# Patient Record
Sex: Female | Born: 1945
Health system: Southern US, Community
[De-identification: ages and names within clinical notes are randomized; demographics above are authoritative.]

## PROBLEM LIST (undated history)

## (undated) DIAGNOSIS — G4730 Sleep apnea, unspecified: Secondary | ICD-10-CM

## (undated) DIAGNOSIS — E785 Hyperlipidemia, unspecified: Secondary | ICD-10-CM

## (undated) DIAGNOSIS — R062 Wheezing: Secondary | ICD-10-CM

## (undated) DIAGNOSIS — K469 Unspecified abdominal hernia without obstruction or gangrene: Secondary | ICD-10-CM

## (undated) DIAGNOSIS — I2721 Secondary pulmonary arterial hypertension: Secondary | ICD-10-CM

## (undated) DIAGNOSIS — Z9221 Personal history of antineoplastic chemotherapy: Secondary | ICD-10-CM

## (undated) DIAGNOSIS — D649 Anemia, unspecified: Secondary | ICD-10-CM

## (undated) DIAGNOSIS — Z923 Personal history of irradiation: Secondary | ICD-10-CM

## (undated) DIAGNOSIS — I10 Essential (primary) hypertension: Secondary | ICD-10-CM

## (undated) DIAGNOSIS — Z803 Family history of malignant neoplasm of breast: Secondary | ICD-10-CM

## (undated) DIAGNOSIS — K449 Diaphragmatic hernia without obstruction or gangrene: Secondary | ICD-10-CM

## (undated) DIAGNOSIS — D869 Sarcoidosis, unspecified: Secondary | ICD-10-CM

## (undated) DIAGNOSIS — Z5189 Encounter for other specified aftercare: Secondary | ICD-10-CM

## (undated) DIAGNOSIS — R0602 Shortness of breath: Secondary | ICD-10-CM

## (undated) DIAGNOSIS — Z8042 Family history of malignant neoplasm of prostate: Secondary | ICD-10-CM

## (undated) HISTORY — DX: Anemia, unspecified: D64.9

## (undated) HISTORY — DX: Family history of malignant neoplasm of breast: Z80.3

## (undated) HISTORY — DX: Family history of malignant neoplasm of prostate: Z80.42

## (undated) HISTORY — DX: Hyperlipidemia, unspecified: E78.5

## (undated) HISTORY — DX: Sarcoidosis, unspecified: D86.9

## (undated) HISTORY — DX: Wheezing: R06.2

## (undated) HISTORY — DX: Diaphragmatic hernia without obstruction or gangrene: K44.9

## (undated) HISTORY — PX: VESICOVAGINAL FISTULA CLOSURE W/ TAH: SUR271

## (undated) HISTORY — DX: Essential (primary) hypertension: I10

## (undated) HISTORY — DX: Secondary pulmonary arterial hypertension: I27.21

## (undated) HISTORY — DX: Encounter for other specified aftercare: Z51.89

## (undated) HISTORY — DX: Unspecified abdominal hernia without obstruction or gangrene: K46.9

---

## 1986-07-21 HISTORY — PX: ABDOMINAL HYSTERECTOMY: SHX81

## 1998-06-18 ENCOUNTER — Other Ambulatory Visit: Admission: RE | Admit: 1998-06-18 | Discharge: 1998-06-18 | Payer: Self-pay | Admitting: Gynecology

## 1999-07-11 ENCOUNTER — Encounter: Payer: Self-pay | Admitting: Orthopedic Surgery

## 1999-07-17 ENCOUNTER — Ambulatory Visit (HOSPITAL_COMMUNITY): Admission: RE | Admit: 1999-07-17 | Discharge: 1999-07-17 | Payer: Self-pay | Admitting: Orthopedic Surgery

## 1999-08-08 ENCOUNTER — Other Ambulatory Visit: Admission: RE | Admit: 1999-08-08 | Discharge: 1999-08-08 | Payer: Self-pay | Admitting: Gynecology

## 2000-03-30 ENCOUNTER — Encounter: Admission: RE | Admit: 2000-03-30 | Discharge: 2000-03-30 | Payer: Self-pay | Admitting: Gynecology

## 2000-03-30 ENCOUNTER — Encounter: Payer: Self-pay | Admitting: Gynecology

## 2000-04-08 ENCOUNTER — Encounter (INDEPENDENT_AMBULATORY_CARE_PROVIDER_SITE_OTHER): Payer: Self-pay | Admitting: Specialist

## 2000-04-08 ENCOUNTER — Ambulatory Visit (HOSPITAL_COMMUNITY): Admission: RE | Admit: 2000-04-08 | Discharge: 2000-04-08 | Payer: Self-pay | Admitting: Gastroenterology

## 2000-05-05 ENCOUNTER — Ambulatory Visit (HOSPITAL_COMMUNITY): Admission: RE | Admit: 2000-05-05 | Discharge: 2000-05-05 | Payer: Self-pay | Admitting: Gastroenterology

## 2000-05-21 ENCOUNTER — Encounter: Payer: Self-pay | Admitting: Gastroenterology

## 2000-05-21 ENCOUNTER — Encounter: Admission: RE | Admit: 2000-05-21 | Discharge: 2000-05-21 | Payer: Self-pay | Admitting: Gastroenterology

## 2000-08-12 ENCOUNTER — Other Ambulatory Visit: Admission: RE | Admit: 2000-08-12 | Discharge: 2000-08-12 | Payer: Self-pay | Admitting: Gynecology

## 2001-08-17 ENCOUNTER — Other Ambulatory Visit: Admission: RE | Admit: 2001-08-17 | Discharge: 2001-08-17 | Payer: Self-pay | Admitting: Gynecology

## 2001-08-31 ENCOUNTER — Encounter: Admission: RE | Admit: 2001-08-31 | Discharge: 2001-08-31 | Payer: Self-pay | Admitting: Internal Medicine

## 2001-08-31 ENCOUNTER — Encounter: Payer: Self-pay | Admitting: Internal Medicine

## 2001-12-23 ENCOUNTER — Ambulatory Visit (HOSPITAL_COMMUNITY): Admission: RE | Admit: 2001-12-23 | Discharge: 2001-12-23 | Payer: Self-pay | Admitting: Gastroenterology

## 2002-04-20 ENCOUNTER — Encounter: Payer: Self-pay | Admitting: Internal Medicine

## 2002-04-20 ENCOUNTER — Encounter: Admission: RE | Admit: 2002-04-20 | Discharge: 2002-04-20 | Payer: Self-pay | Admitting: Internal Medicine

## 2002-08-19 ENCOUNTER — Other Ambulatory Visit: Admission: RE | Admit: 2002-08-19 | Discharge: 2002-08-19 | Payer: Self-pay | Admitting: Gynecology

## 2002-08-29 ENCOUNTER — Encounter: Payer: Self-pay | Admitting: Gynecology

## 2002-08-29 ENCOUNTER — Encounter: Admission: RE | Admit: 2002-08-29 | Discharge: 2002-08-29 | Payer: Self-pay | Admitting: Gynecology

## 2002-09-05 ENCOUNTER — Encounter: Payer: Self-pay | Admitting: Internal Medicine

## 2002-09-05 ENCOUNTER — Encounter: Admission: RE | Admit: 2002-09-05 | Discharge: 2002-09-05 | Payer: Self-pay | Admitting: Internal Medicine

## 2002-09-20 ENCOUNTER — Encounter: Admission: RE | Admit: 2002-09-20 | Discharge: 2002-09-20 | Payer: Self-pay | Admitting: Internal Medicine

## 2002-09-20 ENCOUNTER — Encounter: Payer: Self-pay | Admitting: Internal Medicine

## 2002-12-20 ENCOUNTER — Encounter: Payer: Self-pay | Admitting: Internal Medicine

## 2002-12-20 ENCOUNTER — Encounter: Admission: RE | Admit: 2002-12-20 | Discharge: 2002-12-20 | Payer: Self-pay | Admitting: Internal Medicine

## 2003-07-22 DIAGNOSIS — Z5189 Encounter for other specified aftercare: Secondary | ICD-10-CM

## 2003-07-22 DIAGNOSIS — IMO0001 Reserved for inherently not codable concepts without codable children: Secondary | ICD-10-CM

## 2003-07-22 HISTORY — DX: Encounter for other specified aftercare: Z51.89

## 2003-07-22 HISTORY — DX: Reserved for inherently not codable concepts without codable children: IMO0001

## 2003-07-22 HISTORY — PX: BACK SURGERY: SHX140

## 2003-08-23 ENCOUNTER — Other Ambulatory Visit: Admission: RE | Admit: 2003-08-23 | Discharge: 2003-08-23 | Payer: Self-pay | Admitting: Gynecology

## 2003-08-31 ENCOUNTER — Encounter: Admission: RE | Admit: 2003-08-31 | Discharge: 2003-08-31 | Payer: Self-pay | Admitting: Gynecology

## 2004-06-06 ENCOUNTER — Inpatient Hospital Stay (HOSPITAL_COMMUNITY): Admission: RE | Admit: 2004-06-06 | Discharge: 2004-06-10 | Payer: Self-pay | Admitting: Orthopaedic Surgery

## 2004-09-05 ENCOUNTER — Encounter: Admission: RE | Admit: 2004-09-05 | Discharge: 2004-09-05 | Payer: Self-pay | Admitting: Gynecology

## 2004-09-27 ENCOUNTER — Other Ambulatory Visit: Admission: RE | Admit: 2004-09-27 | Discharge: 2004-09-27 | Payer: Self-pay | Admitting: Gynecology

## 2005-04-21 ENCOUNTER — Other Ambulatory Visit: Admission: RE | Admit: 2005-04-21 | Discharge: 2005-04-21 | Payer: Self-pay | Admitting: Gynecology

## 2005-10-22 ENCOUNTER — Encounter: Admission: RE | Admit: 2005-10-22 | Discharge: 2005-10-22 | Payer: Self-pay | Admitting: Gynecology

## 2005-10-27 ENCOUNTER — Other Ambulatory Visit: Admission: RE | Admit: 2005-10-27 | Discharge: 2005-10-27 | Payer: Self-pay | Admitting: Gynecology

## 2006-04-28 ENCOUNTER — Other Ambulatory Visit: Admission: RE | Admit: 2006-04-28 | Discharge: 2006-04-28 | Payer: Self-pay | Admitting: Gynecology

## 2006-10-26 ENCOUNTER — Encounter: Admission: RE | Admit: 2006-10-26 | Discharge: 2006-10-26 | Payer: Self-pay | Admitting: Gynecology

## 2006-10-28 ENCOUNTER — Ambulatory Visit (HOSPITAL_COMMUNITY): Admission: RE | Admit: 2006-10-28 | Discharge: 2006-10-28 | Payer: Self-pay | Admitting: Pulmonary Disease

## 2006-11-04 ENCOUNTER — Other Ambulatory Visit: Admission: RE | Admit: 2006-11-04 | Discharge: 2006-11-04 | Payer: Self-pay | Admitting: Gynecology

## 2007-04-05 ENCOUNTER — Encounter: Admission: RE | Admit: 2007-04-05 | Discharge: 2007-04-05 | Payer: Self-pay | Admitting: Internal Medicine

## 2007-10-27 ENCOUNTER — Encounter: Admission: RE | Admit: 2007-10-27 | Discharge: 2007-10-27 | Payer: Self-pay | Admitting: Internal Medicine

## 2007-11-26 ENCOUNTER — Ambulatory Visit: Payer: Self-pay | Admitting: Internal Medicine

## 2007-11-26 DIAGNOSIS — R0602 Shortness of breath: Secondary | ICD-10-CM | POA: Insufficient documentation

## 2007-11-26 DIAGNOSIS — D869 Sarcoidosis, unspecified: Secondary | ICD-10-CM

## 2007-11-26 LAB — CONVERTED CEMR LAB: Angiotensin 1 Converting Enzyme: 94 units/L — ABNORMAL HIGH (ref 9–67)

## 2007-12-09 DIAGNOSIS — I10 Essential (primary) hypertension: Secondary | ICD-10-CM

## 2007-12-09 DIAGNOSIS — E785 Hyperlipidemia, unspecified: Secondary | ICD-10-CM | POA: Insufficient documentation

## 2007-12-30 ENCOUNTER — Ambulatory Visit: Payer: Self-pay | Admitting: Internal Medicine

## 2008-06-30 ENCOUNTER — Ambulatory Visit: Payer: Self-pay | Admitting: Internal Medicine

## 2008-11-09 ENCOUNTER — Encounter: Admission: RE | Admit: 2008-11-09 | Discharge: 2008-11-09 | Payer: Self-pay | Admitting: Internal Medicine

## 2008-12-27 ENCOUNTER — Encounter: Admission: RE | Admit: 2008-12-27 | Discharge: 2008-12-27 | Payer: Self-pay | Admitting: Orthopedic Surgery

## 2009-06-29 ENCOUNTER — Ambulatory Visit: Payer: Self-pay | Admitting: Internal Medicine

## 2009-06-29 LAB — CONVERTED CEMR LAB: Angiotensin 1 Converting Enzyme: 70 units/L — ABNORMAL HIGH (ref 9–67)

## 2009-07-11 ENCOUNTER — Encounter: Admission: RE | Admit: 2009-07-11 | Discharge: 2009-07-11 | Payer: Self-pay | Admitting: Internal Medicine

## 2009-11-27 ENCOUNTER — Encounter: Admission: RE | Admit: 2009-11-27 | Discharge: 2009-11-27 | Payer: Self-pay | Admitting: Gynecology

## 2009-12-03 ENCOUNTER — Ambulatory Visit (HOSPITAL_COMMUNITY): Admission: RE | Admit: 2009-12-03 | Discharge: 2009-12-03 | Payer: Self-pay | Admitting: Internal Medicine

## 2010-06-11 ENCOUNTER — Encounter: Payer: Self-pay | Admitting: Internal Medicine

## 2010-06-28 ENCOUNTER — Encounter: Payer: Self-pay | Admitting: Internal Medicine

## 2010-06-28 ENCOUNTER — Ambulatory Visit: Payer: Self-pay | Admitting: Internal Medicine

## 2010-06-28 DIAGNOSIS — K219 Gastro-esophageal reflux disease without esophagitis: Secondary | ICD-10-CM

## 2010-07-02 ENCOUNTER — Ambulatory Visit: Payer: Self-pay | Admitting: Internal Medicine

## 2010-07-03 ENCOUNTER — Ambulatory Visit: Payer: Self-pay | Admitting: Internal Medicine

## 2010-07-08 ENCOUNTER — Ambulatory Visit: Payer: Self-pay | Admitting: Internal Medicine

## 2010-07-08 ENCOUNTER — Encounter: Payer: Self-pay | Admitting: Internal Medicine

## 2010-07-10 ENCOUNTER — Encounter: Payer: Self-pay | Admitting: Internal Medicine

## 2010-08-11 ENCOUNTER — Encounter: Payer: Self-pay | Admitting: Internal Medicine

## 2010-08-14 ENCOUNTER — Encounter: Payer: Self-pay | Admitting: Internal Medicine

## 2010-08-22 NOTE — Miscellaneous (Signed)
Summary: Orders Update pft charges  Clinical Lists Changes  Orders: Added new Service order of Carbon Monoxide diffusing w/capacity (94720) - Signed Added new Service order of Lung Volumes (94240) - Signed Added new Service order of Spirometry (Pre & Post) (94060) - Signed 

## 2010-08-22 NOTE — Assessment & Plan Note (Signed)
Summary: f/u 1 yr ///kp   Copy to:  [place] Primary Provider/Referring Provider:  Mirna Mires, MD  CC:  Yearly follow up visit-sarcoidosis; having increased SOB with activity, slight wheezing, and "popping" sounds when breathing out.Rachel Kelly  History of Present Illness:  06/30/08-sarcoid Declines flu vax.Feels fine. Walking less- cites back spasm. Denies fever, sweat, rash, nodes, cough.  June 29, 2009- Hx Sarcoid For most of past year has noted has noted more dyspnea with exertion and some wheeze at night. She is comfortable supine and sitting, and it doesn't wake her. No fever, sweats, glands, rash, palpitation or chest pain. Right lower leg swoll on long plane flight in late July with no hx varices or DVT. Due for physical in January. She declines flu vax.  June 28, 2010- Hx Sarcoid Nurse-CC:  Yearly follow up visit-sarcoidosis; having increased SOB with activity,slight wheezing, "popping" sounds when breathing out. CXR 06/29/09- Large hiatal hernia, stable sarcoid changes. ACE- 06/29/09 70 (9-67).  Started home oxygen at 2 L/M,  for sleep, for past 2 weeks  per Dr Loleta Chance after [facility]. She had negative w/u by cardiologist. Notes DOE walking, esp hills and stairs. Little wheeze, cough, chest pain or palpitation. No night sweats. Has gained weight. Aware that she snores.     Preventive Screening-Counseling & Management  Alcohol-Tobacco     Smoking Status: never  Current Medications (verified): 1)  Maxzide-25 37.5-25 Mg  Tabs (Triamterene-Hctz) .... Take 1/2 Tablet By Mouth Once A Day 2)  Zocor 20 Mg  Tabs (Simvastatin) .... Take 1 Tablet By Mouth Once A Day 3)  Vitamin D3 2000 Unit  Caps (Cholecalciferol) .... Take 1 Capsule By Mouth Once A Day 4)  Co Q-10 100 Mg  Caps (Coenzyme Q10) .... Take 1 Capsule By Mouth Once A Day 5)  Potassium Gluconate 595 Mg  Tabs (Potassium Gluconate) .... Take 1 Tablet By Mouth Once A Day 6)  Magnesium Gluconate 500 Mg Tabs (Magnesium Gluconate) ....  Take 1 By Mouth Once Daily 7)  Oxygen  At Bedtime 2l/m .... [facility]  Allergies (verified): 1)  ! Penicillin  Past History:  Past Medical History: Last updated: 11/26/2007 Hyperlipidemia Hypertension Sarcoidosis  Past Surgical History: Last updated: 11/26/2007 back surgery hysterectomy  Family History: Last updated: 11/26/2007 Family History Coronary Heart Disease Cancer Mother- pulmonary embolism  Social History: Last updated: 11/26/2007 Patient never smoked.  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  The patient denies shortness of breath at rest, productive cough, non-productive cough, coughing up blood, chest pain, irregular heartbeats, acid heartburn, indigestion, loss of appetite, abdominal pain, difficulty swallowing, sore throat, headaches, nasal congestion/difficulty breathing through nose, and sneezing.         Weight gain  Vital Signs:  Patient profile:   65 year old female Weight:      185.38 pounds O2 Sat:      99 % on Room air Pulse rate:   81 / minute BP sitting:   126 / 84  (left arm) Cuff size:   regular  Vitals Entered By: Reynaldo Minium CMA (June 28, 2010 10:37 AM)  O2 Flow:  Room air CC: Yearly follow up visit-sarcoidosis; having increased SOB with activity,slight wheezing, "popping" sounds when breathing out.   Physical Exam  Additional Exam:  General: A/Ox3; pleasant and cooperative, NAD, obese SKIN: no rash, lesions NODES: no lymphadenopathy HEENT: [place]/AT, EOM- WNL, Conjuctivae- clear, PERRLA, TM-WNL, Nose- clear, Throat- clear and wnl, Mallampati  II NECK: Supple w/ fair ROM, JVD- none, normal carotid impulses w/o bruits Thyroid- normal to palpation CHEST: Clear to P&A,  HEART: RRR, no m/g/r heard ABDOMEN: Soft and nl;  VWU:JWJX, nl  pulses, no edema  NEURO: Grossly intact to observation      CXR  Procedure date:  06/29/2009  Findings:      DG CHEST 2 VIEW - [id number]   Clinical Data: Dyspnea, sarcoidosis   CHEST - 2 VIEW   Comparison: 11/26/2007   Findings: Cardiomegaly again noted.  Large hiatal hernia is stable. Stable bilateral hilar prominence and right perihilar scarring. No acute infiltrate or edema.   IMPRESSION: No significant change.  Stable chronic findings of sarcoid.  Large hiatal hernia again noted.  No active disease.   Read By:  Kennieth Francois,  M.D.     Released By:  Kennieth Francois,  M.D.   Impression & Recommendations:  Problem # 1:  SARCOIDOSIS (ICD-135) Mild elevation of ACE level last year but not symtomatic of active inflammatory sarcoid. We will recheck ACE for trend.   Problem # 2:  DYSPNEA (ICD-786.05)  Now on O2 for sleep. Adnmits snoring., denies daytime sleepiness.  . She says O2 was low for 5 hours on ONOX. Her 6 MWT was good 2 years ago and resting room air sat now is 99%. I suspect sleep or postion related desaturation- perhaps obstructive sleep apnea or obesity hypoeventilation. We discussed weight loss. We will update CXR, PFT, ONOX on O2.   Problem # 3:  HIATAL HERNIA (ICD-553.3) Large hiatal hernia on chest xray. Reflux is denied, but reflux precautions are advised anyway.   Medications Added to Medication List This Visit: 1)  Magnesium Gluconate 500 Mg Tabs (Magnesium gluconate) .... Take 1 by mouth once daily 2)  Oxygen At Bedtime 2l/m  .... [facility]  Other Orders: Est. Patient Level IV (62130) T-Angiotensin i-Converting Enzyme ([id number]) Misc. Referral (Misc. Ref) T-2 View CXR ([id number])  Patient Instructions: 1)  Please schedule a follow-up appointment in 3 months. 2)  See PCC to schedule PFT, 6 MWT, overnigtht oximetry on oxygen 3)  Lab 4)  A chest x-ray has been recommended.  Your imaging study may require preauthorization.  5)  Try a  saline nose spray (AYR and oters, otc) for the popping sound, as needed.      CXR  Procedure date:  06/29/2009  Findings:      DG CHEST 2 VIEW - [id number]   Clinical Data: Dyspnea, sarcoidosis   CHEST - 2 VIEW   Comparison: 11/26/2007   Findings: Cardiomegaly again noted.  Large hiatal hernia is stable. Stable bilateral hilar prominence and right perihilar scarring. No acute infiltrate or edema.   IMPRESSION: No significant change.  Stable chronic findings of sarcoid.  Large hiatal hernia again noted.  No active disease.   Read By:  Kennieth Francois,  M.D.     Released By:  Kennieth Francois,  M.D.

## 2010-08-22 NOTE — Assessment & Plan Note (Signed)
Summary: SIX MIN WALK-PULM STRESS TEST  Nurse Visit   Vital Signs:  Patient profile:   65 year old female Pulse rate:   91 / minute BP sitting:   128 / 70   Medications Prior to Update: 1)  Maxzide-25 37.5-25 Mg  Tabs (Triamterene-Hctz) .... Take 1/2 Tablet By Mouth Once A Day 2)  Zocor 20 Mg  Tabs (Simvastatin) .... Take 1 Tablet By Mouth Once A Day 3)  Vitamin D3 2000 Unit  Caps (Cholecalciferol) .... Take 1 Capsule By Mouth Once A Day 4)  Co Q-10 100 Mg  Caps (Coenzyme Q10) .... Take 1 Capsule By Mouth Once A Day 5)  Potassium Gluconate 595 Mg  Tabs (Potassium Gluconate) .... Take 1 Tablet By Mouth Once A Day 6)  Magnesium Gluconate 500 Mg Tabs (Magnesium Gluconate) .... Take 1 By Mouth Once Daily 7)  Oxygen  At Bedtime 2l/m .... [facility]  Allergies: 1)  ! Penicillin  Orders Added: 1)  Pulmonary Stress (6 min walk) [94620]   Six Minute Walk Test Medications taken before test(dose and time): 1)  Maxzide-25 37.5-25 Mg  Tabs (Triamterene-Hctz) .... Take 1/2 Tablet By Mouth Once A Day 2)  Zocor 20 Mg  Tabs (Simvastatin) .... Take 1 Tablet By Mouth Once A Day 3)  Vitamin D3 2000 Unit  Caps (Cholecalciferol) .... Take 1 Capsule By Mouth Once A Day 4)  Co Q-10 100 Mg  Caps (Coenzyme Q10) .... Take 1 Capsule By Mouth Once A Day 5)  Potassium Gluconate 595 Mg  Tabs (Potassium Gluconate) .... Take 1 Tablet By Mouth Once A Day 6)  Magnesium Gluconate 500 Mg Tabs (Magnesium Gluconate) .... Take 1 By Mouth Once Daily Pt took all meds at 10 am today Supplemental oxygen during the test: No  Lap counter(place a tick mark inside a square for each lap completed) lap 1 complete  lap 2 complete   lap 3 complete   lap 4 complete  lap 5 complete  lap 6 complete  lap 7 complete   lap 8 complete    Baseline  BP sitting: 128/ 70 Heart rate: 91 Dyspnea ( Borg scale) 2 Fatigue (Borg scale) 0 SPO2 97  End Of Test  BP sitting: 134/ 76 Heart rate: 132 Dyspnea ( Borg scale)  4 Fatigue (Borg scale) 0 SPO2 96  2 Minutes post  BP sitting: 130/ 72 Heart rate: 101 SPO2 96  Stopped or paused before six minutes? No Other symptoms at end of exercise: Dizziness  Interpretation: Number of laps  8 X 48 meters =   384 meters+ final partial lap: 30 meters =    414 meters   Total distance walked in six minutes: 414 meters  Tech ID: Tivis Ringer, CNA (July 08, 2010 12:04 PM) Tech Comments Pt completed test w/ 0 rest breaks and 1 complaint:"slight" dizziness which resolved at 2 mins post.

## 2010-08-28 NOTE — Procedures (Signed)
Summary: Oximetry/[facility]  Oximetry/[facility]   Imported By: Lester [place] 08/22/2010 09:21:27  _____________________________________________________________________  External Attachment:    Type:   Image     Comment:   External Document

## 2010-08-28 NOTE — Procedures (Signed)
Summary: Oxmetry/[facility]  Oxmetry/[facility]   Imported By: Lester Ramos 08/22/2010 09:20:02  _____________________________________________________________________  External Attachment:    Type:   Image     Comment:   External Document

## 2010-09-26 ENCOUNTER — Ambulatory Visit (INDEPENDENT_AMBULATORY_CARE_PROVIDER_SITE_OTHER): Payer: Medicare Other | Admitting: Internal Medicine

## 2010-09-26 ENCOUNTER — Encounter: Payer: Self-pay | Admitting: Internal Medicine

## 2010-09-26 DIAGNOSIS — K449 Diaphragmatic hernia without obstruction or gangrene: Secondary | ICD-10-CM

## 2010-09-26 DIAGNOSIS — D869 Sarcoidosis, unspecified: Secondary | ICD-10-CM

## 2010-09-26 DIAGNOSIS — R0602 Shortness of breath: Secondary | ICD-10-CM

## 2010-10-08 NOTE — Procedures (Signed)
Summary: Pulse Oximetry/IDS  Pulse Oximetry/IDS   Imported By: Sherian Rein 10/02/2010 13:05:38  _____________________________________________________________________  External Attachment:    Type:   Image     Comment:   External Document

## 2010-10-08 NOTE — Assessment & Plan Note (Signed)
Summary: 3 month follow up   Copy to:  [place] Primary Provider/Referring Provider:  Mirna Mires, MD  CC:  3 month follow up visit-[facility] has helping and still using as needed.Marland Kitchen  History of Present Illness: June 29, 2009- Hx Sarcoid For most of past year has noted has noted more dyspnea with exertion and some wheeze at night. She is comfortable supine and sitting, and it doesn't wake her. No fever, sweats, glands, rash, palpitation or chest pain. Right lower leg swoll on long plane flight in late July with no hx varices or DVT. Due for physical in January. She declines flu vax.  June 28, 2010- Hx Sarcoid Nurse-CC:  Yearly follow up visit-sarcoidosis; having increased SOB with activity,slight wheezing, "popping" sounds when breathing out. CXR 06/29/09- Large hiatal hernia, stable sarcoid changes. ACE- 06/29/09 70 (9-67).  Started home oxygen at 2 L/M,  for sleep, for past 2 weeks  per Dr Loleta Chance after [facility]. She had negative w/u by cardiologist. Notes DOE walking, esp hills and stairs. Little wheeze, cough, chest pain or palpitation. No night sweats. Has gained weight. Aware that she snores.  September 26, 2010- Hx Sarcoid, Hiatal Hernia, PHTN,43 Nurse-CC: 3 month follow up visit-[facility] has helping and still using as needed. Lab/cxrReviewed.  Echocardiogram [facility] 08/14/10- Nl EF and LVF 55%, mild PHTN RVSP .  Still short of breath with no change and no problem with spring pollen.  Denies cough, sweats, fever, swollen glands. DOE with hills, stairs, housework and even walking 100 feet level in her home. Notices weight gain w/o fluid rettention.  ACE 06/28/10- 43 (8-52) ONOX 2L/min O2 07/10/10 good/ less than 89% less than 1 minute. 6MWT12/19/11- room air 975, 96%, 96% 414 meters. CXR 06/28/10- Mild stable chornic changes c/w sarcoid. Large Hiatal hernia w/ AFL. PFT- Moderate restriction TLC 65%, DLCO 53%.    Preventive Screening-Counseling & Management  Alcohol-Tobacco     Smoking Status:  never  Current Medications (verified): 1)  Maxzide-25 37.5-25 Mg  Tabs (Triamterene-Hctz) .... Take 1 Tablet By Mouth Once A Day 2)  Zocor 20 Mg  Tabs (Simvastatin) .... Take 1 Tablet By Mouth Once A Day 3)  Vitamin D3 2000 Unit  Caps (Cholecalciferol) .... Take 1 Capsule By Mouth Once A Day 4)  Co Q-10 100 Mg  Caps (Coenzyme Q10) .... Take 1 Capsule By Mouth Once A Day 5)  Potassium Gluconate 595 Mg  Tabs (Potassium Gluconate) .... Take 1 Tablet By Mouth Once A Day 6)  Magnesium Gluconate 500 Mg Tabs (Magnesium Gluconate) .... Take 1 By Mouth Once Daily 7)  Oxygen  At Bedtime 2l/m .... [facility]  Allergies (verified): 1)  ! Penicillin  Past History:  Past Medical History: Hyperlipidemia Hypertension Sarcoidosis  Mild Pulmonary Hypertension Echo 07/10/10 RVSP [facility] Hiatal Hernia  Review of Systems      See HPI       The patient complains of dyspnea on exertion.  The patient denies anorexia, fever, weight loss, weight gain, vision loss, decreased hearing, hoarseness, chest pain, syncope, peripheral edema, prolonged cough, headaches, hemoptysis, abdominal pain, severe indigestion/heartburn, unusual weight change, abnormal bleeding, enlarged lymph nodes, and angioedema.    Vital Signs:  Patient profile:   65 year old female Height:      60 inches Weight:      187.13 pounds BMI:     36.68 O2 Sat:      96 % on Room air Pulse rate:   72 / minute BP sitting:  120 / 72  (left arm) Cuff size:   large  Vitals Entered By: Reynaldo Minium CMA (September 26, 2010 10:44 AM)  O2 Flow:  Room air CC: 3 month follow up visit-[facility] has helping and still using as needed.   Physical Exam  Additional Exam:  General: A/Ox3; pleasant and cooperative, NAD, obese SKIN: no rash, lesions NODES: no lymphadenopathy HEENT: [place]/AT, EOM- WNL, Conjuctivae- clear, PERRLA, TM-WNL, Nose- clear, Throat- clear and wnl, Mallampati  II NECK: Supple w/ fair ROM, JVD- none, normal carotid impulses  w/o bruits Thyroid- normal to palpation CHEST: Clear to P&A,  HEART: RRR, no m/g/r heard ABDOMEN: Soft and nl;  ZOX:WRUE, nl pulses, no edema  NEURO: Grossly intact to observation      Impression & Recommendations:  Problem # 1:  SARCOIDOSIS (ICD-135) Clinically inactive, but likely contributes some to her restriction ond reduced DLCO even though CXR doesn't show bad interstitial disease.  Problem # 2:  DYSPNEA (ICD-786.05) Obesity and sarcoid are main issues. Pulmonary hypertension is mild and I doubt it would cause much problem for now, especially since she did well on 6 MWT. I recommended emphasis on weight loss and conditioning. She expresses intention to join a program.  Discussed sleep study- right now she isn't describing the clues ofr OSA  Medications Added to Medication List This Visit: 1)  Maxzide-25 37.5-25 Mg Tabs (Triamterene-hctz) .... Take 1 tablet by mouth once a day  Other Orders: Est. Patient Level IV (45409)  Patient Instructions: 1)  Please schedule a follow-up appointment in 3 months. 2)  Walk as much as you can. [facility] 3)  Anything you can do to lose some weight should help the shortness of breath.  4)  Please ask Dr Loleta Chance about help with the hiatal hernia 5)  Copies of lab results 6)  cc Dr Loleta Chance

## 2010-10-28 ENCOUNTER — Other Ambulatory Visit: Payer: Self-pay | Admitting: Family Medicine

## 2010-10-28 DIAGNOSIS — Z1231 Encounter for screening mammogram for malignant neoplasm of breast: Secondary | ICD-10-CM

## 2010-11-01 ENCOUNTER — Other Ambulatory Visit: Payer: Self-pay | Admitting: Gynecology

## 2010-11-29 ENCOUNTER — Ambulatory Visit: Payer: Medicare Other

## 2010-12-06 NOTE — Procedures (Signed)
[place]. [facility]  Patient:    Rachel Kelly, Rachel Kelly                       MRN: [id number] Proc. Date: 05/05/00 Adm. Date:  [id number] Attending:  Charna Elizabeth CC:         Dr. Dorothyann Peng   Procedure Report  DATE OF BIRTH:  [birth year]  REFERRING PHYSICIAN:  Dorothyann Peng, M.D.  PROCEDURE PERFORMED:  Colonoscopy.  ENDOSCOPIST:  Anselmo Rod, M.D.  INSTRUMENT USED:  Olympus video colonoscope.  INDICATIONS FOR PROCEDURE:  Iron deficiency anemia in a 65 year old black female rule out colonic polyps, masses, hemorrhoids, etc.  PREPROCEDURE PREPARATION:  Informed consent was procured from the patient. The patient was fasted for eight hours prior to the procedure and prepped with a bottle of magnesium citrate and a gallon of NuLytely the night prior to the procedure.  PREPROCEDURE PHYSICAL:  The patient had stable vital signs.  Neck supple. Chest clear to auscultation.  S1, S2 regular.  Abdomen soft with normal abdominal bowel sounds.  DESCRIPTION OF PROCEDURE:  The patient was placed in the left lateral decubitus position and sedated with 50 mg of Demerol and 4 mg of Versed intravenously.  Once the patient was adequately sedated and maintained on low-flow oxygen and continuous cardiac monitoring, the Olympus video colonoscope was advanced from the rectum to the cecum without difficulty.  No masses, polyps, erosions, ulcerations or diverticula were seen.  The patient tolerated the procedure well without complications.  No hemorrhoids were seen on retroflexion in the rectum.  IMPRESSION:  Normal colonoscopy up to the cecum.  RECOMMENDATIONS:  A small bowel follow-through will be done to complete her GI evaluation and further recommendations made as needed. DD:  05/05/00 TD:  05/05/00 Job: [id number]

## 2010-12-06 NOTE — Op Note (Signed)
[place]. [facility]  Patient:    Rachel Kelly                        MRN: [id number] Proc. Date: 07/17/99 Adm. Date:  [id number] Attending:  Wende Mott                           Operative Report  PREOPERATIVE DIAGNOSIS:  Dorsal callosity with spur formation left little toe.  POSTOPERATIVE DIAGNOSIS:  Dorsal callosity with spur formation left little toe.  ANESTHESIA:  General.  PROCEDURE:  Proximal phalangectomy left little toe.  The patient was taken to the operating room after given ______ and intubated.  The left foot was prepped with Duraprep and draped in a sterile manner.  Bovie was used for hemostasis.  A dorsal midline incision made over the left little toe on to the skin and subcutaneous tissue.  Hemostasis obtained with the Bovie.  The osteophyte was identified. The proximal phalanx was completely resected.  Copious irrigation was done with the use of antibiotic solution, followed by wound closure using 4 0 nylon.  A compressive dressing was applied.  Fracture shoe applied.  The patient tolerated the procedure quite well and went to the recovery room in stable and satisfactory condition. The patient is being discharged home on Percocet 1 q.4h. p.r.n. for pain. Elevation and ice packs and use of fracture shoe.  The patient is to return to the office in one week.  The patient is being discharged in stable and satisfactory condition. DD:  07/17/99 TD:  07/17/99 Job: [id number]

## 2010-12-06 NOTE — Op Note (Signed)
NAME:  Rachel Kelly, Rachel Kelly NO.:  [id number]   MEDICAL RECORD NO.:  [id number]          PATIENT TYPE:  INP   LOCATION:  5025                         FACILITY:  [facility]   PHYSICIAN:  Sharolyn Douglas, M.D.        DATE OF BIRTH:  1946/03/19   DATE OF PROCEDURE:  06/06/2004  DATE OF DISCHARGE:                                 OPERATIVE REPORT   PREOPERATIVE DIAGNOSIS:  Degenerative L4-5 spondylolisthesis with underlying  spinal stenosis.   POSTOPERATIVE DIAGNOSIS:  Degenerative L4-5 spondylolisthesis with  underlying spinal stenosis.   OPERATION PERFORMED:  1.  L4-5 decompressive laminectomy with wide decompression of the thecal sac      and nerve roots bilaterally.  2.  Transforaminal lumbar interbody fusion with placement of 12 mm PEAK cage      packed with local bone graft.  3.  Pedicle screw instrumentation, L4-5 using spinal concepts system.  4.  Posterior spinal arthrodesis, L4-5.  5.  Local autogenous bone graft supplemented with 5 mL [place]on.  6.  Neural monitoring testing of four pedicles screws, monitoring of EMGs      times two hours.   SURGEON:  Sharolyn Douglas, M.D.   ASSISTANT:  Verlin Fester, P.A.   ANESTHESIA:  General endotracheal.   COMPLICATIONS:  None.   INDICATIONS FOR PROCEDURE:  The patient is a pleasant 65 year old female  with symptoms consistent with neurogenic claudication and right lower  extremity radiculopathy thought to be secondary to degenerative  spondylolisthesis at L4-5 and spinal stenosis.  She has elected to undergo  L4-5 decompression and fusion in hopes of improving her symptoms.   DESCRIPTION OF PROCEDURE:  The patient was properly identified in the  holding area and taken to the operating room.  She underwent general  endotracheal anesthesia without difficulty.  She was given prophylactic IV  antibiotics.  She was very carefully position on the Wilson frame.  All bony  prominences were padded.  Face and eyes were protected at all  times.  Back  prepped and draped in the usual sterile fashion.  A midline incision was  made over the L4-5 interspaces.  Dissection was carried sharply through the  deep fascia.  Paraspinal muscles were elevated out over the L4 and L5  transverse processes.  Deep retractors were placed.  Wide laminectomy  completed removing the entire spinous process of L4 and lamina.  Lateral  recess was radically decompressed out to the pedicle.  Foraminotomies were  completed.  We felt we had good decompression of the thecal sac and nerve  roots bilaterally.  We then placed pedicle screws at L4 and L5 bilaterally.  Using anatomic probing technique, we were able to palpate the pedicles from  within the canal as well as within the pedicle holes.  We utilized 6.5 x 45  mm screws times 4.  We had good screw purchase.  We then turned our  attention to placing a TLIF on the right side at L4-5.  A facetectomy was  completed.  The L4 and L5 nerve roots identified and protected at all times.  Free  running EMGs monitors throughout the procedure.  There was no  deleterious changes.  Sharp annulotomy followed by various curettes to  completely remove the disk across the contralateral side.  The cartilaginous  end plates were scraped.  The disk space was dilated to 12 mm.  Disk space  packed with local bone graft obtained from laminectomy.  A 12 mm PEAK cage  was then inserted  which had been packed with local bone graft, inserted  obliquely into the interspace.  We had good distraction.  Then turned our  attention to completing the posterior spinal arthrodesis by decorticating L4  and L5 transverse processes bilaterally with high speed bur packing  remaining bone graft into the lateral gutters.  Each pedicle screw was  stimulated using triggered EMGs.  There were no deleterious changes.  Titanium rods were placed into the polyaxial screw heads.  Gentle  compression was applied before shearing off the locking caps.   The wound was  irrigated.  Intraoperative x-rays showed appropriate positioning of the  instrumentation at L4-5.  Deep Hemovac drain left in place.  Deep fascia  closed with a running #1 Vicryl suture, subcutaneous layer closed with 2-0  Vicryl suture followed by a running 3-0 subcuticular Vicryl suture on the  skin edges.  Benzoin and Steri-Strips placed.  Sterile dressing applied. The  patient was turned supine, extubated without difficulty and transported to  the recovery room in stable condition.       MC/MEDQ  D:  06/06/2004  T:  06/07/2004  Job:  [id number]

## 2010-12-06 NOTE — H&P (Signed)
[place]. [facility]  Patient:    Rachel Kelly                        MRN: [id number] Adm. Date:  [id number] Attending:  Wende Mott                         History and Physical  CHIEF COMPLAINT:  Painful left little toe.  HISTORY OF PRESENT ILLNESS:  This is a 65 year old female who had been followed in the office for spur formation and dorsal callosity of the left little toe.  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  PAST MEDICAL HISTORY:  Right fifth toe phalangectomy, history of hysterectomy/oophorectomy, heel spur.  CURRENT MEDICATIONS: 1. K-Dur. 2. Betamethasone cream 1% p.r.n. 3. Chlorthalidone 25 mg daily.  ALLERGIES:  PENICILLIN, which causes a rash.  HABITS:  None.  FAMILY HISTORY:  Noncontributory.  REVIEW OF SYSTEMS:  Basically, the patient has been in good health.  No cardiac or respiratory, and no urinary or bowel symptoms.  PHYSICAL EXAMINATION:  VITAL SIGNS:  Temperature 98.2, pulse 80, respirations 18, blood pressure 138/80, height 51 inches, weight 200 pounds.  HEENT:  Normocephalic.  Eyes:  Sclerae clear.  NECK:  Supple.  CHEST:  Clear.  CARDIAC:  S1, S2 regular.  EXTREMITIES:  Left little toe markedly tender, dorsal callosity of the fifth toe with osteophyte.  NEUROLOGIC:  Neurovascular status is intact.  IMPRESSION:  Osteophyte left little toe with dorsal callosity. DD:  07/17/99 TD:  07/17/99 Job: [id number]

## 2010-12-06 NOTE — Discharge Summary (Signed)
NAME:  Rachel Kelly, Rachel Kelly                ACCOUNT NO.:  [id number]   MEDICAL RECORD NO.:  [id number]          PATIENT TYPE:  INP   LOCATION:  5025                         FACILITY:  [facility]   PHYSICIAN:  Sharolyn Douglas, M.D.        DATE OF BIRTH:  19-Sep-1945   DATE OF ADMISSION:  06/06/2004  DATE OF DISCHARGE:  06/10/2004                                 DISCHARGE SUMMARY   ADMITTING DIAGNOSES:  1.  Spondylolisthesis L4-5.  2.  Hypertension.  3.  History of ulcer.  4.  Sarcoidosis.  5.  Mild hyponatremia postoperatively, resolved prior to discharge.   DISCHARGE DIAGNOSES:  1.  Status post posterior spinal fusion and laminectomy L4-5 doing well.  2.  Postoperative anemia requiring blood transfusion.  3.  Postoperative hyponatremia, resolved prior to discharge.  4.  Hypertension.  5.  History of ulcer.  6.  Sarcoidosis.   PROCEDURE:  June 06, 2004 patient was taken to the operating room for L4-  5 posterior spinal fusion and T lift as well as laminectomy.  This was done  by Sharolyn Douglas, M.D.  Assistant [employer], P.A.-C.  Anesthesia used was  general.   LABORATORIES:  Preoperatively:  CBC with differential was within normal  limits with the exception of red cells of 3.75, hemoglobin 11.7, hematocrit  33.9.  Complete metabolic panel within normal limits with the exception of  creatinine slightly elevated at 1.5.  UA from November 14 showed few  epithelials, 3-6 wbc's, and many bacteria.  Blood typing from November 14 is  type A+, antibody screen negative.  H&H three days postoperatively reached a  low of 8.6 and 24.6 on November 19.  She was transfused the following day  and hemoglobin and hematocrit increased up well to 11.4 and 32.3,  respectively.  PT/INR and PTT preoperatively within normal limits.  Basic  metabolic panel from postoperative day one on June 07, 2004 showed  sodium of 128, glucose 121, calcium of 8.1, otherwise normal.  Following day  this was rechecked.  Sodium  had returned to normal limits, glucose normal,  and calcium remained slightly decreased at 8.3.  EKG from November 14 showed  normal sinus rhythm, normal tracing read by Dr. Peter [place]az[place].  X-rays from  November 17 portable spine was used intraoperatively for localization.  November 21 showed good position/alignment following L4-5 diskectomy/fusion.   BRIEF HISTORY:  Patient is a 65 year old female _________ continued pain in  her back and into her right lower extremity.  Developed neurogenic  claudication.  Her pain is moderate intensity in leg and 6/10.  Anti-  inflammatory, physical therapy, chiropractic care, injections epidurally in  the facet have not given her any relief of her symptoms.  Secondary to the  severe findings on MRI as well as her continued pain and failure of  conservative management it is thought the best course of management would be  a decompression and fusion at L4-5.  Risks and benefits of this were  discussed with the patient by Dr. Noel Gerold as well as myself.  She indicated  understanding and opted  to proceed.   HOSPITAL COURSE:  On June 06, 2004 patient was taken to the operating  room for the above listed procedures.  She tolerated procedure well without  any intraoperative complications.  Postoperatively routine orthopedic spine  protocol was followed.  She progressed along relatively well with that  protocol.  On postoperative day two patient's hemoglobin had reached a low  of 8.6.  She was symptomatic.  Therefore, she was transfused 2 units of  packed red blood cells.  She tolerated transfusion well then her hemoglobin  improved significantly by the following day as previously dictated.  By  postoperative day one she did develop hyponatremia at 128.  Her IV fluids  were changed to normal saline and by the following day her sodium had  improved to 135.  On postoperative day two daily dressing changes were  started.  Her Hemovac drain was pulled without any  difficulty.  Her diet was  slowly advanced after flatus and she progressed along well.  By June 10, 2004 patient had met all orthopedic goals.  She was medically stable and  ready for discharge.  Her hemoglobin was stable.  She was status of  independent with physical therapy and ambulation.   DISCHARGE PLAN:  Patient is a 65 year old female status post posterior  spinal fusion doing well.   ACTIVITY:  Daily ambulation program.  Brace on when she is up.  Back  precautions at all times.  No lifting heavier than 5 pounds.  Daily dressing  changes.  Keep incision dry until drainage completely stopped.  Follow up  two weeks postoperatively with Dr. Noel Gerold.   MEDICATIONS:  She will resume her home medications as well as Vicodin as  needed for pain, Robaxin as needed for muscle spasm, multivitamin daily,  calcium daily, laxative as needed.  Avoid anti-inflammatories and resume all  home medications.   FOLLOW-UP:  Follow up two weeks postoperatively with Dr. Noel Gerold.   DIET:  Regular home diet.   CONDITION ON DISCHARGE:  Stable, improved.   DISPOSITION:  Patient will be discharged to her home with the family  assistance as well as home health physical therapy and occupational therapy  per [place].      CM/MEDQ  D:  08/21/2004  T:  08/21/2004  Job:  [id number]

## 2010-12-06 NOTE — Procedures (Signed)
[place]. [facility]  Patient:    Rachel Kelly, Rachel Kelly                       MRN: [id number] Proc. Date: 04/08/00 Adm. Date:  [id number] Attending:  Charna Elizabeth CC:         Dr. Velna Hatchet   Procedure Report  DATE OF BIRTH:  1945/10/22.  REFERRING PHYSICIAN:  Dr. Velna Hatchet.  PROCEDURE PERFORMED:  Esophagogastroduodenoscopy with biopsies.  ENDOSCOPIST:  Anselmo Rod, M.D.  INSTRUMENT USED:  Olympus video panendoscope.  INDICATIONS FOR PROCEDURE:  Melenic stool in a 66 year old black female for the last four days.  Rule out peptic ulcer disease, esophagitis, gastritis, etc.  PREPROCEDURE PREPARATION:  Informed consent was procured from the patient. The patient was fasted for eight hours prior to the procedure.  PREPROCEDURE PHYSICAL:  The patient had stable vital signs.  Neck supple. Chest clear to auscultation.  S1, S2 regular.  Abdomen soft with epigastric tenderness on palpation with guarding, no rebound, no rigidity, no hepatosplenomegaly.  DESCRIPTION OF PROCEDURE:  The patient was placed in left lateral decubitus position and sedated with 30 mg of Demerol and 3 mg of Versed intravenously. Once the patient was adequately sedated and maintained on low-flow oxygen and continuous cardiac monitoring, the Olympus video panendoscope was advanced through the mouthpiece, over the tongue, into the esophagus under direct vision.  The entire esophagus appeared normal without evidence of ring, string, stricture or esophagitis.  The scope was then advanced into the stomach.  Multiple gastric ulcers were seen in the proximal half of the stomach with one superficial ulcer with an adherent clot and another ulcer with old heme overlying it and another deeper ulcer measuring about 0.5 to 1.0 cm in size with an adherent clot.  This ulcer was injected with 2 cc of epinephrine at the ulcer base.  There was evidence of gastritis around the ulcers which  seemed to span both the lesser and greater curvature in the proximal half of the stomach.  The prepyloric area of the small bowel up to 70 cm appeared normal.  There was no outlet obstruction.  The patient tolerated the procedure well without complications.  A small hiatal hernia was seen on high retroflexion.  IMPRESSION: 1. Multiple gastric ulcers. 2. One ulcer with adherent clot injected with epinephrine (2 cc). 3. Small hiatal hernia. 4. Normal prepyloric area, pylorus, proximal small bowel and esophagus. 5. Biopsies done around the ulcers to rule out Helicobacter pylori by    pathology.  RECOMMENDATION: 1. Patient will be started on Prevacid 30 mg 1 p.o. q.d. 2. If there is evidence of Helicobacter pylori infection on pathology or    serology, she will be treated with antibiotics. 3. She has strongly been advised to refrain from the use of all nonsteroidals. 4. Carafate slurry 1 gm q.i.d. prescribed for her for the next 15 days. 5. A repeat EGD is recommended in the next eight weeks considering her    history of multiple gastric ulcers. DD:  04/08/00 TD:  04/09/00 Job: [id number]

## 2010-12-06 NOTE — Op Note (Signed)
[place]. [facility]  Patient:    Rachel Kelly, Rachel Kelly Visit Number: [id number] MRN: [id number]          Service Type: END Location: ENDO Attending Physician:  Charna Elizabeth Dictated by:   Anselmo Rod, M.D. Proc. Date: 12/23/01 Admit Date:  12/23/2001 Discharge Date: 12/23/2001   CC:         Velna Hatchet, M.D.   Operative Report  DATE OF BIRTH: September 06, 1944.  PROCEDURES PERFORMED: Esophagogastroduodenoscopy.  ENDOSCOPIST: Anselmo Rod, M.D.  INSTRUMENTS USED: Olympus video panendoscope.  INDICATIONS FOR PROCEDURE: The patient is a 65 year old African-American female with a history of ulcers. A repeat EGD is being done to document healing. Preprocedure preparation and informed consent was procured from the patient. The patient fasted for eight hours prior to the procedure.  PREPROCEDURE PHYSICAL: The patient had stable vital signs. Neck was supple. Chest clear to auscultation. S1, S2 regular. Abdomen soft with normoactive bowel sounds.  DESCRIPTION OF THE PROCEDURE: The patient was placed in the left lateral decubitus position and sedated with 30 mg of Demerol and 3 mg of Versed intravenously. Once the patient was adequately sedated and maintained on low flow oxygen and continuous cardiac monitoring, the Olympus video panendoscope was advanced through the mouth piece over the tongue into the esophagus and under direct vision the entire esophagus appeared normal without evidence of rings, strictures, masses, esophagitis or varus mucosa.  The scope was then advanced into the stomach. A large hiatal hernia was seen. Retroflexion was not possible. The rest of the gastric mucosa in the proximal small bowel appeared normal.  IMPRESSION: Normal EGD except for a large hiatal hernia.  RECOMMENDATIONS  1. The patient will been seen in the office on an outpatient basis and an    upper GI series done to further evaluate the size of her hiatal  hernia and    recommendations made thereafter. 2. Antireflux measures have been advised. 3. Avoidance of all nonsteroidals including aspirin has been discussed with    the patient.   Dictated by:   Anselmo Rod, M.D. Attending Physician:  Charna Elizabeth DD:  12/24/01 TD:  12/27/01 Job: [id number]

## 2010-12-06 NOTE — H&P (Signed)
NAME:  Rachel Kelly, CHOI NO.:  [id number]   MEDICAL RECORD NO.:  [id number]          PATIENT TYPE:  INP   LOCATION:                               FACILITY:  [facility]   PHYSICIAN:  Sharolyn Douglas, M.D.        DATE OF BIRTH:  11-16-1945   DATE OF ADMISSION:  06/06/2004  DATE OF DISCHARGE:                                HISTORY & PHYSICAL   CHIEF COMPLAINT:  Pain in my right leg.   HISTORY OF PRESENT ILLNESS:  This 65 year old female with persistent and  continuing pain into her buttock and right lower extremity.  She has  developed a neurogenic claudication.  Her pain is moderate intensity into  the leg with a 6/10.  She has had anti-inflammatories, physical therapy,  chiropractic care, epidural steroid injections as well as facet injections  which unfortunately has not given her any lasting relief.  MRI has shown  multifactorial and lateral recess stenosis, specifically at L3-4, and L4-5.  The L4-5 spondylolisthesis with spinal stenosis was most significant and it  was decided to go ahead with surgical intervention concerning that level.  The risks and benefits of surgery have been discussed with the patient by  Dr. Noel Gerold and she has elected to go ahead.   PAST MEDICAL HISTORY:  This patient's physician is Merlene Laughter. Renae Gloss, M.D.  of [facility] here in [place] and she has cleared  the patient for surgery. Current medication for hypertension is Triam/HCTZ  37.5/25 mg one daily and Extra Strength Tylenol for discomfort.   ALLERGIES:  PENICILLIN causes rash.   She also has ulcer disease in the distant past and has done well with her  treatment with p.o. medications.  She had renal calculi some years ago.   PAST SURGICAL HISTORY:  Ovarian cysts with fibroid tumor excision in 1986  and removal of a heel spur some time in the early 90's.   FAMILY HISTORY:  Positive for heart disease in her father, hypertension in  the mother, and grandmother with  diabetes.   SOCIAL HISTORY:  The patient is single, retired Engineer, site.  No intake  of alcohol or tobacco products.  Has one child.  The caregiver after surgery  will be her mother and daughter.   REVIEW OF SYSTEMS:  CNS:  No seizure disorder, paralysis, numbness, or  double vision.  RESPIRATORY:  No productive cough, no hemoptysis, no  shortness of breath.  CARDIOVASCULAR:  No chest pain, no angina, and no  orthopnea.  GASTROINTESTINAL:  No nausea, vomiting, melena, or bloody  stools.  GENITOURINARY:  No discharge, dysuria, or hematuria.  MUSCULOSKELETAL:  Primarily in present illness.   PHYSICAL EXAMINATION:  GENERAL:  Alert and cooperative, friendly, well-  groomed 65 year old female.  VITAL SIGNS:  Blood pressure 102/72, pulse 64, respirations 17,  HEENT:  Normocephalic.  PERRLA.  EOM intact.  Oropharynx is clear.  CHEST:  Clear to auscultation, no rhonchi and no rales.  HEART:  Regular rate and rhythm with no murmurs heard.  ABDOMEN:  Soft and nontender.  The liver and  spleen are not felt.  GENITOURINARY:  RECTAL:  BREASTS:  Not done, no pertinent to present  illness.  EXTREMITIES:  The patient has symmetric DTR's in the lower extremity,  negative clonus, and sensory is intact.  Straight leg raise is negative.   ADMISSION DIAGNOSES:  1.  L4-5 spondylolisthesis with spinal stenosis.  2.  Hypertension.  3.  History of ulcer disease.  4.  Sarcoidosis.   PLAN:  The patient will undergo L4-5 laminectomy with posterior spinal  fusion and transthoracic lumbar interbody fusion.       ________________________________________  Druscilla Brownie Cherlynn June.  ___________________________________________  Sharolyn Douglas, M.D.    DLU/MEDQ  D:  05/10/2004  T:  05/10/2004  Job:  [id number]   cc:   Merlene Laughter. Renae Gloss, M.D.  [address]  [address]  [place]  [place] [address]  Fax: [phone]

## 2010-12-24 ENCOUNTER — Ambulatory Visit
Admission: RE | Admit: 2010-12-24 | Discharge: 2010-12-24 | Disposition: A | Discharge status: Home / Self Care | Payer: Medicare Other | Source: Ambulatory Visit | Attending: Family Medicine | Admitting: Family Medicine

## 2010-12-24 DIAGNOSIS — Z1231 Encounter for screening mammogram for malignant neoplasm of breast: Secondary | ICD-10-CM

## 2010-12-26 ENCOUNTER — Encounter: Payer: Self-pay | Admitting: Internal Medicine

## 2010-12-27 ENCOUNTER — Ambulatory Visit (INDEPENDENT_AMBULATORY_CARE_PROVIDER_SITE_OTHER): Payer: Medicare Other | Admitting: Internal Medicine

## 2010-12-27 ENCOUNTER — Encounter: Payer: Self-pay | Admitting: Internal Medicine

## 2010-12-27 VITALS — BP 110/80 | HR 69 | Ht 60.0 in | Wt 174.0 lb

## 2010-12-27 DIAGNOSIS — D869 Sarcoidosis, unspecified: Secondary | ICD-10-CM

## 2010-12-27 DIAGNOSIS — R0602 Shortness of breath: Secondary | ICD-10-CM

## 2010-12-27 NOTE — Assessment & Plan Note (Signed)
She is not dyspneic with ADLS and didn't desaturate at last . Weight loss may make the real difference here. We will update her ONOX on room air.

## 2010-12-27 NOTE — Patient Instructions (Signed)
Order- [facility] schedule ONOX on room air  Dx sarcoid                              Cc Dr Alyse Low

## 2010-12-27 NOTE — Assessment & Plan Note (Signed)
Clinically stable. Last ACE was not elevated.

## 2010-12-27 NOTE — Progress Notes (Signed)
  Subjective:    Patient ID: Rachel Kelly, female    DOB: Jul 13, 1946, 65 y.o.   MRN: [id number]  HPI 12/27/10- 49 yoF never smoker followed for sarcoid, complicated by dyspnea, hiatal hernia/ GERD risk Last here September 26, 2010- note reviewed. Labs were updated from 12/11 visit. Since March says she has done very well, denying dyspnea, wheeze or night sweats Admits just a little dry cough occasionally- not concerned. No rash or nodes. She has lost some weight and would like to retest oxygen to see if she can stop O2 used now just at night. She did not desat on 6 MWT in December. DLCO was 53% then.   Review of Systems Constitutional:   No weight loss, night sweats,  Fevers, chills, fatigue, lassitude. HEENT:   No headaches,  Difficulty swallowing,  Tooth/dental problems,  Sore throat,                No sneezing, itching, ear ache, nasal congestion, post nasal drip,   CV:  No chest pain,  Orthopnea, PND, swelling in lower extremities, anasarca, dizziness, palpitations  GI  No heartburn, indigestion, abdominal pain, nausea, vomiting, diarrhea, change in bowel habits, loss of appetite  Resp: No shortness of breath with exertion or at rest that she notices for her level of fitness. .  No excess mucus, no productive cough,  Mild non-productive cough,  No coughing up of blood.  No change in color of mucus.  No wheezing.   Skin: no rash or lesions.  GU: no dysuria, change in color of urine, no urgency or frequency.  No flank pain.  MS:  No joint pain or swelling.  No decreased range of motion.  No back pain.  Psych:  No change in mood or affect. No depression or anxiety.  No memory loss.      Objective:   Physical Exam General- Alert, Oriented, Affect-appropriate, Distress- none acute  Still overweight  Skin- rash-none, lesions- none, excoriation- none  Lymphadenopathy- none  Head- atraumatic  Eyes- Gross vision intact, PERRLA, conjunctivae clear secretions  Ears- Hearing, canals, Tm -  normal  Nose- Clear, No- Septal dev, mucus, polyps, erosion, perforation   Throat- Mallampati II , mucosa clear , drainage- none, tonsils- atrophic  Neck- flexible , trachea midline, no stridor , thyroid nl, carotid no bruit  Chest - symmetrical excursion , unlabored     Heart/CV- RRR , no murmur , no gallop  , no rub, nl s1 s2                     - JVD- none , edema- none, stasis changes- none, varices- none     Lung- clear to P&A, wheeze- none, cough- none , dullness-none, rub- none     Chest wall-   Abd- tender-no, distended-no, bowel sounds-present, HSM- no  Br/ Gen/ Rectal- Not done, not indicated  Extrem- cyanosis- none, clubbing, none, atrophy- none, strength- nl  Neuro- grossly intact to observation         Assessment & Plan:

## 2011-03-03 ENCOUNTER — Telehealth: Payer: Self-pay | Admitting: Internal Medicine

## 2011-03-03 NOTE — Telephone Encounter (Signed)
Pt was last seen by CY on 6/8 and order was sent to [facility] for ONO.  Pt states she hasn't been contacted by a DME company  to have this done.  PCCs, please advise.  Thanks.

## 2011-03-04 NOTE — Telephone Encounter (Signed)
This is one of those order that pcc can not see it was not done will sent it as apirioty to [facility] to do ono on ra

## 2011-04-09 ENCOUNTER — Other Ambulatory Visit: Payer: Self-pay | Admitting: Family Medicine

## 2011-04-11 ENCOUNTER — Ambulatory Visit
Admission: RE | Admit: 2011-04-11 | Discharge: 2011-04-11 | Disposition: A | Discharge status: Home / Self Care | Payer: Medicare Other | Source: Ambulatory Visit | Attending: Family Medicine | Admitting: Family Medicine

## 2011-04-28 ENCOUNTER — Ambulatory Visit (INDEPENDENT_AMBULATORY_CARE_PROVIDER_SITE_OTHER): Payer: Medicare Other | Admitting: Internal Medicine

## 2011-04-28 ENCOUNTER — Encounter: Payer: Self-pay | Admitting: Internal Medicine

## 2011-04-28 VITALS — BP 128/76 | HR 94 | Ht 60.0 in | Wt 179.4 lb

## 2011-04-28 DIAGNOSIS — D869 Sarcoidosis, unspecified: Secondary | ICD-10-CM

## 2011-04-28 DIAGNOSIS — K449 Diaphragmatic hernia without obstruction or gangrene: Secondary | ICD-10-CM

## 2011-04-28 NOTE — Patient Instructions (Signed)
On your trip away from home see what your room mate notices about snoring or stopping breathing in your sleep. If you will call us back after that, we can schedule a sleep study to look for sleep apnea.  For now, continue oxygen for sleep at 2 l/m

## 2011-04-28 NOTE — Progress Notes (Signed)
Patient ID: Rachel Kelly, female    DOB: 05-Jun-1946, 65 y.o.   MRN: [id number] PCP Dr Mirna Mires  HPI 12/27/10- 22 yoF never smoker followed for sarcoid, complicated by dyspnea, hiatal hernia/ GERD risk Last here September 26, 2010- note reviewed. Labs were updated from 12/11 visit. Since March says she has done very well, denying dyspnea, wheeze or night sweats Admits just a little dry cough occasionally- not concerned. No rash or nodes. She has lost some weight and would like to retest oxygen to see if she can stop O2 used now just at night. She did not desat on 6 MWT in December. DLCO was 53% then.   04/28/11-  65 yoF never smoker followed for sarcoid, complicated by dyspnea, hiatal hernia/ GERD risk She considers herself "low risk" and therefore declines flu vaccine.Overnight oximetry on room air 03/05/2011 recorded over 48 minutes with oxygen saturation less than or equal to 88%. We discussed home oxygen for sleep. She or he has home oxygen for sleep from [facility] at 2 L per minute but had hoped to come off. She understands that she should continue. She prefers to sleep on her stomach but has been told she snores. We discussed the possibility of sleep apnea given her body build, as a risk contributing to the oxygen situation. She is alone with no reporter. She is interested in having a sleep study done. She denies cough, wheeze, chest discomfort, fever, enlarged lymph nodes, or rash. PFT 07/08/2010-FEV1 1.22/68%, FEV1/FVC 0.80, small airway flows 56%. Insignificant response to bronchodilator. TLC 65%, DLCO 53%. CXR 06/28/10- CHEST - 2 VIEW  Comparison: 07/11/2009 chest radiograph.  Findings: Again seen is a large hiatal hernia with associated air-  fluid level. Cardiomegaly is stable. Mild prominence of the  inferior right paratracheal soft tissues are stable. There is some  scarring in the right perihilar region. No discrete focal airspace  disease is identified. Left lung base is  partially obscured by the  large hiatal hernia. No acute bony abnormality is identified.  IMPRESSION:  Stable chest radiograph. Mild stable chronic changes, likely  reflective of patient's known sarcoidosis.  Large hiatal hernia with air-fluid level.  Provider: Bud Face     Review of Systems Constitutional:   No-   weight loss, night sweats, fevers, chills, fatigue, lassitude. HEENT:   No-  headaches, difficulty swallowing, tooth/dental problems, sore throat,       No-  sneezing, itching, ear ache, nasal congestion, post nasal drip,  CV:  No-   chest pain, orthopnea, PND, swelling in lower extremities, anasarca,  dizziness, palpitations Resp: No-   shortness of breath with exertion or at rest.              No-   productive cough,  No non-productive cough,  No-  coughing up of blood.              No-   change in color of mucus.  No- wheezing.   Skin: No-   rash or lesions. GI:  No-   heartburn, indigestion, abdominal pain, nausea, vomiting, diarrhea,                 change in bowel habits, loss of appetite GU: No-   dysuria, change in color of urine, no urgency or frequency.  No- flank pain. MS:  No-   joint pain or swelling.  No- decreased range of motion.  No- back pain. Neuro- grossly normal to observation, Or:  Psych:  No- change in mood or affect. No depression or anxiety.  No memory loss.  Objective:   Physical Exam General- Alert, Oriented, Affect-appropriate, Distress- none acute, overweight Skin- rash-none, lesions- none, excoriation- none Lymphadenopathy- none Head- atraumatic            Eyes- Gross vision intact, PERRLA, conjunctivae clear secretions            Ears- Hearing, canals-normal            Nose- Clear, no-Septal dev, mucus, polyps, erosion, perforation             Throat- Mallampati III , mucosa clear , drainage- none, tonsils- atrophic Neck- flexible , trachea midline, no stridor , thyroid nl, carotid no bruit Chest - symmetrical excursion , unlabored            Heart/CV- RRR , no murmur , no gallop  , no rub, nl s1 s2                           - JVD- none , edema- none, stasis changes- none, varices- none           Lung- clear to P&A, wheeze- none, cough- none , dullness-none, rub- none           Chest wall-  Abd- tender-no, distended-no, bowel sounds-present, HSM- no Br/ Gen/ Rectal- Not done, not indicated Extrem- cyanosis- none, clubbing, none, atrophy- none, strength- nl Neuro- grossly intact to observation          Assessment & Plan:

## 2011-04-29 NOTE — Assessment & Plan Note (Signed)
Current symptoms do not suggest active sarcoid inflammation at this time. Pulmonary functions and chest x-ray indicate mixed obstructive and restrictive change consistent with history of sarcoid a nonsmoker.

## 2011-05-06 ENCOUNTER — Encounter: Payer: Self-pay | Admitting: Internal Medicine

## 2011-05-13 ENCOUNTER — Other Ambulatory Visit: Payer: Self-pay | Admitting: Gynecology

## 2011-07-10 ENCOUNTER — Other Ambulatory Visit: Payer: Self-pay | Admitting: Podiatry

## 2011-11-05 ENCOUNTER — Ambulatory Visit
Admission: RE | Admit: 2011-11-05 | Discharge: 2011-11-05 | Disposition: A | Discharge status: Home / Self Care | Payer: Medicare Other | Source: Ambulatory Visit | Attending: Family Medicine | Admitting: Family Medicine

## 2011-11-05 ENCOUNTER — Other Ambulatory Visit: Payer: Self-pay | Admitting: Family Medicine

## 2011-11-05 DIAGNOSIS — M542 Cervicalgia: Secondary | ICD-10-CM

## 2011-12-08 ENCOUNTER — Other Ambulatory Visit: Payer: Self-pay | Admitting: Family Medicine

## 2011-12-08 DIAGNOSIS — Z1231 Encounter for screening mammogram for malignant neoplasm of breast: Secondary | ICD-10-CM

## 2011-12-25 ENCOUNTER — Ambulatory Visit: Payer: Medicare Other

## 2012-01-13 ENCOUNTER — Ambulatory Visit (INDEPENDENT_AMBULATORY_CARE_PROVIDER_SITE_OTHER)
Admission: RE | Admit: 2012-01-13 | Discharge: 2012-01-13 | Disposition: A | Discharge status: Home / Self Care | Payer: Medicare Other | Source: Ambulatory Visit | Attending: Internal Medicine | Admitting: Internal Medicine

## 2012-01-13 ENCOUNTER — Encounter: Payer: Self-pay | Admitting: Internal Medicine

## 2012-01-13 ENCOUNTER — Other Ambulatory Visit (INDEPENDENT_AMBULATORY_CARE_PROVIDER_SITE_OTHER): Payer: Medicare Other

## 2012-01-13 ENCOUNTER — Ambulatory Visit (INDEPENDENT_AMBULATORY_CARE_PROVIDER_SITE_OTHER): Payer: Medicare Other | Admitting: Internal Medicine

## 2012-01-13 VITALS — BP 102/76 | HR 76 | Ht 60.0 in | Wt 178.8 lb

## 2012-01-13 DIAGNOSIS — R0609 Other forms of dyspnea: Secondary | ICD-10-CM

## 2012-01-13 DIAGNOSIS — D869 Sarcoidosis, unspecified: Secondary | ICD-10-CM

## 2012-01-13 DIAGNOSIS — R0989 Other specified symptoms and signs involving the circulatory and respiratory systems: Secondary | ICD-10-CM

## 2012-01-13 DIAGNOSIS — K449 Diaphragmatic hernia without obstruction or gangrene: Secondary | ICD-10-CM

## 2012-01-13 DIAGNOSIS — R0600 Dyspnea, unspecified: Secondary | ICD-10-CM

## 2012-01-13 DIAGNOSIS — R0602 Shortness of breath: Secondary | ICD-10-CM

## 2012-01-13 LAB — CBC WITH DIFFERENTIAL/PLATELET
Basophils Absolute: 0 10*3/uL (ref 0.0–0.1)
Basophils Relative: 0.4 % (ref 0.0–3.0)
Eosinophils Absolute: 0.1 10*3/uL (ref 0.0–0.7)
Lymphocytes Relative: 37.5 % (ref 12.0–46.0)
MCHC: 32.8 g/dL (ref 30.0–36.0)
Neutrophils Relative %: 50.6 % (ref 43.0–77.0)
Platelets: 192 10*3/uL (ref 150.0–400.0)
RBC: 4.13 Mil/uL (ref 3.87–5.11)

## 2012-01-13 NOTE — Patient Instructions (Addendum)
Order- CXR  Dx Sarcoid  Order- lab-  D-dimer, BNP, CBC w/diff    Dx Dyspnea, sarcoid

## 2012-01-13 NOTE — Progress Notes (Signed)
Patient ID: Rachel Kelly, female    DOB: [birth year], 66 y.o.   MRN: [id number] PCP Dr Mirna Mires  HPI 12/27/10- 64 yoF never smoker followed for sarcoid, complicated by dyspnea, hiatal hernia/ GERD risk Last here September 26, 2010- note reviewed. Labs were updated from 12/11 visit. Since March says she has done very well, denying dyspnea, wheeze or night sweats Admits just a little dry cough occasionally- not concerned. No rash or nodes. She has lost some weight and would like to retest oxygen to see if she can stop O2 used now just at night. She did not desat on 6 MWT in December. DLCO was 53% then.   04/28/11-  65 yoF never smoker followed for sarcoid, complicated by dyspnea, hiatal hernia/ GERD risk She considers herself "low risk" and therefore declines flu vaccine.Overnight oximetry on room air 03/05/2011 recorded over 48 minutes with oxygen saturation less than or equal to 88%. We discussed home oxygen for sleep. She or he has home oxygen for sleep from [facility] at 2 L per minute but had hoped to come off. She understands that she should continue. She prefers to sleep on her stomach but has been told she snores. We discussed the possibility of sleep apnea given her body build, as a risk contributing to the oxygen situation. She is alone with no reporter. She is interested in having a sleep study done. She denies cough, wheeze, chest discomfort, fever, enlarged lymph nodes, or rash. PFT 07/08/2010-FEV1 1.22/68%, FEV1/FVC 0.80, small airway flows 56%. Insignificant response to bronchodilator. TLC 65%, DLCO 53%. CXR 06/28/10- CHEST - 2 VIEW  Comparison: 07/11/2009 chest radiograph.  Findings: Again seen is a large hiatal hernia with associated air-  fluid level. Cardiomegaly is stable. Mild prominence of the  inferior right paratracheal soft tissues are stable. There is some  scarring in the right perihilar region. No discrete focal airspace  disease is identified. Left lung base is  partially obscured by the  large hiatal hernia. No acute bony abnormality is identified.  IMPRESSION:  Stable chest radiograph. Mild stable chronic changes, likely  reflective of patient's known sarcoidosis.  Large hiatal hernia with air-fluid level.  Provider: Bud Face   01/13/12- 65 yoF never smoker followed for sarcoid, complicated by dyspnea, hiatal hernia/ GERD risk    Patient ID: Rachel Kelly, female    DOB: 01-06-1946, 66 y.o.   MRN: [id number] PCP Dr Mirna Mires  HPI 12/27/10- 2 yoF never smoker followed for sarcoid, complicated by dyspnea, hiatal hernia/ GERD risk Last here September 26, 2010- note reviewed. Labs were updated from 12/11 visit. Since March says she has done very well, denying dyspnea, wheeze or night sweats Admits just a little dry cough occasionally- not concerned. No rash or nodes. She has lost some weight and would like to retest oxygen to see if she can stop O2 used now just at night. She did not desat on 6 MWT in December. DLCO was 53% then.   04/28/11-  65 yoF never smoker followed for sarcoid, complicated by dyspnea, hiatal hernia/ GERD risk She considers herself "low risk" and therefore declines flu vaccine.Overnight oximetry on room air 03/05/2011 recorded over 48 minutes with oxygen saturation less than or equal to 88%. We discussed home oxygen for sleep. She or he has home oxygen for sleep from [facility] at 2 L per minute but had hoped to come off. She understands that she should continue. She prefers to sleep on her  stomach but has been told she snores. We discussed the possibility of sleep apnea given her body build, as a risk contributing to the oxygen situation. She is alone with no reporter. She is interested in having a sleep study done. She denies cough, wheeze, chest discomfort, fever, enlarged lymph nodes, or rash. PFT 07/08/2010-FEV1 1.22/68%, FEV1/FVC 0.80, small airway flows 56%. Insignificant response to bronchodilator. TLC 65%, DLCO  53%. CXR 06/28/10- CHEST - 2 VIEW  Comparison: 07/11/2009 chest radiograph.  Findings: Again seen is a large hiatal hernia with associated air-  fluid level. Cardiomegaly is stable. Mild prominence of the  inferior right paratracheal soft tissues are stable. There is some  scarring in the right perihilar region. No discrete focal airspace  disease is identified. Left lung base is partially obscured by the  large hiatal hernia. No acute bony abnormality is identified.  IMPRESSION:  Stable chest radiograph. Mild stable chronic changes, likely  reflective of patient's known sarcoidosis.  Large hiatal hernia with air-fluid level.  Provider: Bud Face   01/13/12- 65 yoF never smoker followed for sarcoid, complicated by dyspnea, hiatal hernia/ GERD risk ACUTE VISIT:  Increased SOB-recently gained weight but unsure if this is the cause of SOB; nothing has changed since last visit. Progressive shortness of breath is bothersome for 4 months, mainly with exertion of walking but not lying supine. Denies cough or wheeze, fever, night sweat, rash or adenopathy. 10 pound weight gain without edema. Has been anemic most of her life. Denies cardiac history and reports normal stress test 2 or 3 years ago.  Review of Systems-see HPI Constitutional:   No-   weight loss, night sweats, fevers, chills, fatigue, lassitude. HEENT:   No-  headaches, difficulty swallowing, tooth/dental problems, sore throat,       No-  sneezing, itching, ear ache, nasal congestion, post nasal drip,  CV:  No-   chest pain, orthopnea, PND, swelling in lower extremities, anasarca,  dizziness, palpitations Resp: +  shortness of breath with exertion or at rest.              No-   productive cough,  No non-productive cough,  No-  coughing up of blood.              No-   change in color of mucus.  No- wheezing.   Skin: No-   rash or lesions. GI:  No-   heartburn, indigestion, abdominal pain, nausea, vomiting, diarrhea,  + bowels  gurgle and loose"                change in bowel habits, loss of appetite GU:  MS:  No-   joint pain or swelling.  Neuro- nothing unusual:  Psych:  No- change in mood or affect. No depression or anxiety.  No memory loss.  Objective:   Physical Exam BP 102/76  Pulse 76  Ht 5' (1.524 m)  Wt 178 lb 12.8 oz (81.103 kg)  BMI 34.92 kg/m2  SpO2 97%  General- Alert, Oriented, Affect-appropriate, Distress- none acute, overweight Skin- rash-none, lesions- none, excoriation- none Lymphadenopathy- none Head- atraumatic            Eyes- Gross vision intact, PERRLA, conjunctivae clear secretions            Ears- Hearing, canals-normal            Nose- Clear, no-Septal dev, mucus, polyps, erosion, perforation             Throat- Mallampati III , mucosa  clear , drainage- none, tonsils- atrophic Neck- flexible , trachea midline, no stridor , thyroid nl, carotid no bruit Chest - symmetrical excursion , unlabored           Heart/CV- RRR , no murmur , no gallop  , no rub, nl s1 s2                           - JVD+ 1 CM/fill from above , edema- none, stasis changes- none, varices- none           Lung- clear to P&A, wheeze- none, cough- none , dullness-none, rub- none           Chest wall-  Abd- tender-no, distended-no, bowel sounds-present, HSM- no Br/ Gen/ Rectal- Not done, not indicated Extrem- cyanosis- none, clubbing, none, atrophy- none, strength- nl, Neg Homan's Neuro- grossly intact to observation  Assessment & Plan:

## 2012-01-14 ENCOUNTER — Other Ambulatory Visit: Payer: Self-pay | Admitting: Internal Medicine

## 2012-01-14 DIAGNOSIS — D869 Sarcoidosis, unspecified: Secondary | ICD-10-CM

## 2012-01-14 DIAGNOSIS — I2699 Other pulmonary embolism without acute cor pulmonale: Secondary | ICD-10-CM

## 2012-01-15 ENCOUNTER — Other Ambulatory Visit (INDEPENDENT_AMBULATORY_CARE_PROVIDER_SITE_OTHER): Payer: Medicare Other

## 2012-01-15 ENCOUNTER — Ambulatory Visit (INDEPENDENT_AMBULATORY_CARE_PROVIDER_SITE_OTHER)
Admission: RE | Admit: 2012-01-15 | Discharge: 2012-01-15 | Disposition: A | Discharge status: Home / Self Care | Payer: Medicare Other | Source: Ambulatory Visit | Attending: Internal Medicine | Admitting: Internal Medicine

## 2012-01-15 DIAGNOSIS — D869 Sarcoidosis, unspecified: Secondary | ICD-10-CM

## 2012-01-15 DIAGNOSIS — I2699 Other pulmonary embolism without acute cor pulmonale: Secondary | ICD-10-CM

## 2012-01-15 LAB — BASIC METABOLIC PANEL
BUN: 20 mg/dL (ref 6–23)
Chloride: 105 mEq/L (ref 96–112)
Potassium: 4.5 mEq/L (ref 3.5–5.1)
Sodium: 138 mEq/L (ref 135–145)

## 2012-01-15 MED ORDER — IOHEXOL 300 MG/ML  SOLN
80.0000 mL | Freq: Once | INTRAMUSCULAR | Status: AC | PRN
  Administered 2012-01-15: 80 mL via INTRAVENOUS

## 2012-01-18 ENCOUNTER — Encounter: Payer: Self-pay | Admitting: Internal Medicine

## 2012-01-18 NOTE — Assessment & Plan Note (Signed)
Plan-chest x-ray, D. dimer, ACE level, CBC to exclude anemia

## 2012-01-18 NOTE — Assessment & Plan Note (Signed)
She has had a large hiatal hernia for a long time. Question if this relates to her GI complaints. I suggested she ask Dr. Loleta Chance about evaluation.

## 2012-01-18 NOTE — Assessment & Plan Note (Addendum)
She is concerned that increased shortness of breath might relate to her sarcoid. Plan-ACE level, chest x-ray

## 2012-01-30 NOTE — Progress Notes (Signed)
Quick Note:  Pt aware of results and next OV. ______ 

## 2012-02-03 ENCOUNTER — Ambulatory Visit
Admission: RE | Admit: 2012-02-03 | Discharge: 2012-02-03 | Disposition: A | Discharge status: Home / Self Care | Payer: Medicare Other | Source: Ambulatory Visit | Attending: Family Medicine | Admitting: Family Medicine

## 2012-02-03 DIAGNOSIS — Z1231 Encounter for screening mammogram for malignant neoplasm of breast: Secondary | ICD-10-CM

## 2012-03-15 ENCOUNTER — Encounter: Payer: Self-pay | Admitting: Internal Medicine

## 2012-03-15 ENCOUNTER — Ambulatory Visit (INDEPENDENT_AMBULATORY_CARE_PROVIDER_SITE_OTHER): Payer: Medicare Other | Admitting: Internal Medicine

## 2012-03-15 VITALS — BP 118/76 | HR 70 | Ht 60.0 in | Wt 177.6 lb

## 2012-03-15 DIAGNOSIS — R0602 Shortness of breath: Secondary | ICD-10-CM

## 2012-03-15 DIAGNOSIS — D869 Sarcoidosis, unspecified: Secondary | ICD-10-CM

## 2012-03-15 DIAGNOSIS — K449 Diaphragmatic hernia without obstruction or gangrene: Secondary | ICD-10-CM

## 2012-03-15 NOTE — Assessment & Plan Note (Signed)
Symptoms don't suggest active disease at this time.

## 2012-03-15 NOTE — Assessment & Plan Note (Signed)
Massive hiatal hernia creating significant volume displacement  in the chest. I discussed the possibility of surgical evaluation with her. The surgical office reportedly needs an upper GI and endoscopy, bronchoscopy, 24-hour pH probe and manometry before considering her. I will refer to GI and will discuss further with surgery.

## 2012-03-15 NOTE — Progress Notes (Signed)
Patient ID: Rachel Kelly, female    DOB: [birth year], 66 y.o.   MRN: [id number] PCP Dr Mirna Mires  HPI 12/27/10- 18 yoF never smoker followed for sarcoid, complicated by dyspnea, hiatal hernia/ GERD risk Last here September 26, 2010- note reviewed. Labs were updated from 12/11 visit. Since March says she has done very well, denying dyspnea, wheeze or night sweats Admits just a little dry cough occasionally- not concerned. No rash or nodes. She has lost some weight and would like to retest oxygen to see if she can stop O2 used now just at night. She did not desat on 6 MWT in December. DLCO was 53% then.   04/28/11-  65 yoF never smoker followed for sarcoid, complicated by dyspnea, hiatal hernia/ GERD risk She considers herself "low risk" and therefore declines flu vaccine.Overnight oximetry on room air 03/05/2011 recorded over 48 minutes with oxygen saturation less than or equal to 88%. We discussed home oxygen for sleep. She or he has home oxygen for sleep from [facility] at 2 L per minute but had hoped to come off. She understands that she should continue. She prefers to sleep on her stomach but has been told she snores. We discussed the possibility of sleep apnea given her body build, as a risk contributing to the oxygen situation. She is alone with no reporter. She is interested in having a sleep study done. She denies cough, wheeze, chest discomfort, fever, enlarged lymph nodes, or rash. PFT 07/08/2010-FEV1 1.22/68%, FEV1/FVC 0.80, small airway flows 56%. Insignificant response to bronchodilator. TLC 65%, DLCO 53%. CXR 06/28/10- CHEST - 2 VIEW  Comparison: 07/11/2009 chest radiograph.  Findings: Again seen is a large hiatal hernia with associated air-  fluid level. Cardiomegaly is stable. Mild prominence of the  inferior right paratracheal soft tissues are stable. There is some  scarring in the right perihilar region. No discrete focal airspace  disease is identified. Left lung base is  partially obscured by the  large hiatal hernia. No acute bony abnormality is identified.  IMPRESSION:  Stable chest radiograph. Mild stable chronic changes, likely  reflective of patient's known sarcoidosis.  Large hiatal hernia with air-fluid level.  Provider: Bud Face   01/13/12- 65 yoF never smoker followed for sarcoid, complicated by dyspnea, hiatal hernia/ GERD risk    Patient ID: Rachel Kelly, female    DOB: 10-25-1945, 66 y.o.   MRN: [id number] PCP Dr Mirna Mires  HPI 12/27/10- 1 yoF never smoker followed for sarcoid, complicated by dyspnea, hiatal hernia/ GERD risk Last here September 26, 2010- note reviewed. Labs were updated from 12/11 visit. Since March says she has done very well, denying dyspnea, wheeze or night sweats Admits just a little dry cough occasionally- not concerned. No rash or nodes. She has lost some weight and would like to retest oxygen to see if she can stop O2 used now just at night. She did not desat on 6 MWT in December. DLCO was 53% then.   04/28/11-  65 yoF never smoker followed for sarcoid, complicated by dyspnea, hiatal hernia/ GERD risk She considers herself "low risk" and therefore declines flu vaccine.Overnight oximetry on room air 03/05/2011 recorded over 48 minutes with oxygen saturation less than or equal to 88%. We discussed home oxygen for sleep. She or he has home oxygen for sleep from [facility] at 2 L per minute but had hoped to come off. She understands that she should continue. She prefers to sleep on her  stomach but has been told she snores. We discussed the possibility of sleep apnea given her body build, as a risk contributing to the oxygen situation. She is alone with no reporter. She is interested in having a sleep study done. She denies cough, wheeze, chest discomfort, fever, enlarged lymph nodes, or rash. PFT 07/08/2010-FEV1 1.22/68%, FEV1/FVC 0.80, small airway flows 56%. Insignificant response to bronchodilator. TLC 65%, DLCO  53%. CXR 06/28/10- CHEST - 2 VIEW  Comparison: 07/11/2009 chest radiograph.  Findings: Again seen is a large hiatal hernia with associated air-  fluid level. Cardiomegaly is stable. Mild prominence of the  inferior right paratracheal soft tissues are stable. There is some  scarring in the right perihilar region. No discrete focal airspace  disease is identified. Left lung base is partially obscured by the  large hiatal hernia. No acute bony abnormality is identified.  IMPRESSION:  Stable chest radiograph. Mild stable chronic changes, likely  reflective of patient's known sarcoidosis.  Large hiatal hernia with air-fluid level.  Provider: Bud Face   01/13/12- 65 yoF never smoker followed for sarcoid, complicated by dyspnea, hiatal hernia/ GERD risk ACUTE VISIT:  Increased SOB-recently gained weight but unsure if this is the cause of SOB; nothing has changed since last visit. Progressive shortness of breath is bothersome for 4 months, mainly with exertion of walking but not lying supine. Denies cough or wheeze, fever, night sweat, rash or adenopathy. 10 pound weight gain without edema. Has been anemic most of her life. Denies cardiac history and reports normal stress test 2 or 3 years ago.  03/15/12- 66 yoF never smoker followed for sarcoid, complicated by dyspnea, hiatal hernia/ GERD risk  Patient states about the same as last visit. Still c/o sob. States her weight is staying  steady.  D-dimer 1.29 led to CT chest: CT chest - 01/14/12-images reviewed with her IMPRESSION:  1. No pulmonary emboli.  2. Huge hiatal hernia which compresses the left lung, the heart,  and the bronchi.  2. Bronchitic changes which could be acute or chronic.  3. Multiple calcified lymph nodes in the mediastinum.  Original Report Authenticated By: Gwynn Burly, M.D.  Office Spirometry-within normal limits  Review of Systems-see HPI Constitutional:   No-   weight loss, night sweats, fevers, chills,  fatigue, lassitude. HEENT:   No-  headaches, difficulty swallowing, tooth/dental problems, sore throat,       No-  sneezing, itching, ear ache, nasal congestion, post nasal drip,  CV:  No-   chest pain, orthopnea, PND, swelling in lower extremities, anasarca,  dizziness, palpitations Resp: +  shortness of breath with exertion or at rest.              No-   productive cough,  + non-productive cough,  No-  coughing up of blood.              No-   change in color of mucus.  No- wheezing.   Skin: No-   rash or lesions. GI:  No-   heartburn, indigestion, abdominal pain, nausea, vomiting, ,  + bowels gurgle and loose"                 GU:  MS:  No-   joint pain or swelling.  Neuro- nothing unusual:  Psych:  No- change in mood or affect. No depression or anxiety.  No memory loss.  Objective:   Physical Exam BP 118/76  Pulse 70  Ht 5' (1.524 m)  Wt 177 lb 9.6  oz (80.559 kg)  BMI 34.69 kg/m2  SpO2 97%  General- Alert, Oriented, Affect-appropriate, Distress- none acute, overweight Skin- rash-none, lesions- none, excoriation- none Lymphadenopathy- none Head- atraumatic            Eyes- Gross vision intact, PERRLA, conjunctivae clear secretions            Ears- Hearing, canals-normal            Nose- Clear, no-Septal dev, mucus, polyps, erosion, perforation             Throat- Mallampati III , mucosa clear , drainage- none, tonsils- atrophic Neck- flexible , trachea midline, no stridor , thyroid nl, carotid no bruit Chest - symmetrical excursion , unlabored           Heart/CV- RRR , no murmur , no gallop  , no rub, nl s1 s2                           - JVD+ 1 CM/fill from above , edema- none, stasis changes- none, varices- none           Lung- clear to P&A/ mildly diminished at left base, wheeze- none, cough- none , dullness-none, rub- none           Chest wall-  Abd-  Br/ Gen/ Rectal- Not done, not indicated Extrem- cyanosis- none, clubbing, none, atrophy- none, strength- nl, Neg  Homan's Neuro- grossly intact to observation  Assessment & Plan:

## 2012-03-15 NOTE — Assessment & Plan Note (Signed)
This is adequately explained by her very large hiatal hernia, most evident when supine. Plan-office spirometry and 6 minute walk test.

## 2012-03-15 NOTE — Patient Instructions (Addendum)
Order- Schedule 6 MWT    Dx Sarcoid, hiatal hernia  Office spirometry today  Order - Referral for GI consultation   Dx large hiatal hernia

## 2012-03-24 ENCOUNTER — Telehealth (INDEPENDENT_AMBULATORY_CARE_PROVIDER_SITE_OTHER): Payer: Self-pay

## 2012-03-24 NOTE — Telephone Encounter (Signed)
Called Rachel Kelly with appointment to see Dr. Johna Sheriff on 03/26/12 @ 10:45 am.  Patient aware to arrive 20-30 minutes prior to appointment for registration.

## 2012-03-26 ENCOUNTER — Encounter (INDEPENDENT_AMBULATORY_CARE_PROVIDER_SITE_OTHER): Payer: Self-pay | Admitting: General Surgery

## 2012-03-26 ENCOUNTER — Ambulatory Visit (INDEPENDENT_AMBULATORY_CARE_PROVIDER_SITE_OTHER): Payer: Medicare Other | Admitting: General Surgery

## 2012-03-26 VITALS — BP 114/70 | HR 68 | Temp 97.1°F | Resp 14 | Ht 59.5 in | Wt 177.4 lb

## 2012-03-26 DIAGNOSIS — K449 Diaphragmatic hernia without obstruction or gangrene: Secondary | ICD-10-CM

## 2012-03-26 NOTE — Progress Notes (Signed)
Subjective:   shortness of breath, hiatal hernia  Patient ID: Rachel Kelly, female   DOB: 09-08-1945, 66 y.o.   MRN: [id number]  HPI The patient is a very pleasant 66 year old female referred by Dr. Jetty Duhamel for findings of a large hiatal hernia. The patient has a fairly long history of dyspnea which was recently reevaluated by Dr. Maple Hudson. She has a history of sarcoidosis but this has been inactive. She had a CT angiogram of her chest as part of this evaluation. I have personally reviewed this study which shows a extremely large hiatal hernia containing the entire stomach, a significant part of the transverse colon and portions of the liver and pancreas. This causes some significant compression of the left lung as well as some displacement of the heart. She has had a thorough evaluation by Dr. Maple Hudson out other definite cause of her shortness of breath and it is felt that the hernia is likely the main source of her symptoms. She has known she had a hiatal hernia from imaging that was done some years ago. She however denies any chest or abdominal pain, dysphagia or symptoms to suggest reflux. She does get some borborygmi in her upper abdomen and chest at night. She however is remarkably symptom-free in terms of GI problems. She gets moderate shortness of breath with exertion and has had in the past low O2 saturations requiring home oxygen but this is a little better recently. Her general health has below is otherwise reasonably good.  She has had a GI evaluation in the past with colonoscopy and endoscopy a number of years ago but she cannot recall the exact indication. His may have just been screening.  Past Medical History  Diagnosis Date  . Hyperlipidemia   . Hypertension   . Sarcoidosis   . PAH (pulmonary artery hypertension)   . HH (hiatus hernia)   . Hernia   . Anemia   . Blood transfusion   . Wheezing    Past Surgical History  Procedure Date  . Back surgery 2005  . Vesicovaginal fistula  closure w/ tah   . Abdominal hysterectomy 1988   Current Outpatient Prescriptions  Medication Sig Dispense Refill  . Dermatological Products, Misc. (NUVAIL) SOLN       . Coenzyme Q10 (CO Q 10) 100 MG CAPS Take 1 capsule by mouth daily.        . magnesium gluconate (MAGONATE) 500 MG tablet Take 500 mg by mouth daily.        . potassium gluconate 595 MG TABS Take 595 mg by mouth daily.        . simvastatin (ZOCOR) 20 MG tablet Take 20 mg by mouth daily.        Marland Kitchen terbinafine (LAMISIL) 250 MG tablet Take 1 tablet by mouth daily.      Marland Kitchen triamterene-hydrochlorothiazide (MAXZIDE-25) 37.5-25 MG per tablet Take 1 tablet by mouth daily.        . urea (CARMOL) 40 % CREA Apply topically Daily.       Allergies  Allergen Reactions  . Penicillins Rash    Fine red bumps that spread all over the body.   History  Substance Use Topics  . Smoking status: Never Smoker   . Smokeless tobacco: Never Used  . Alcohol Use: No  She is a retired Runner, broadcasting/film/video. She lives with and cares for her elderly mother.   Review of Systems  Constitutional: Negative for fever and chills.  HENT: Negative.   Respiratory: Positive for  shortness of breath. Negative for cough, choking, chest tightness, wheezing and stridor.   Cardiovascular: Negative.   Gastrointestinal: Positive for diarrhea. Negative for vomiting, abdominal pain, constipation, blood in stool, abdominal distention and anal bleeding.  Genitourinary: Negative.   Musculoskeletal: Negative.   Psychiatric/Behavioral: Negative.        Objective:   Physical Exam BP 114/70  Pulse 68  Temp 97.1 F (36.2 C) (Temporal)  Resp 14  Ht 4' 11.5" (1.511 m)  Wt 177 lb 6 oz (80.457 kg)  BMI 35.23 kg/m2 General: Alert, somewhat obese African American female, in no distress Skin: Warm and dry without rash or infection. HEENT: No palpable masses or thyromegaly. Sclera nonicteric. Pupils equal round and reactive. Oropharynx clear. Lymph nodes: No cervical,  supraclavicular, or inguinal nodes palpable. Lungs: Breath sounds somewhat decreased in the left base without increased work of breathing Cardiovascular: Regular rate and rhythm without murmur. No JVD or edema. Peripheral pulses intact. Abdomen: Nondistended. Soft and nontender. No masses palpable. No organomegaly. No palpable hernias. Well-healed low midline incision Extremities: No edema or joint swelling or deformity. No chronic venous stasis changes. Neurologic: Alert and fully oriented. Gait normal.     Assessment:     Massive hiatal hernia containing the entire stomach as well as portions of the colon and pancreas. She is remarkably free of any GI symptoms. She has shortness of breath with an otherwise negative workup and I think this may well be contributing to her symptoms. We discussed the possibility of proceeding with laparoscopic and possible open repair of her hiatal hernia. We discussed the indication would be to try to help her breathing and I told her how much this would help her shortness of breath would be a little uncertain. I would think it would help. We discussed the procedure in detail including risks of anesthetic complications, bleeding, infection, intestinal injury, and recurrent hernia. She will think over these issues and get back to me regarding her decision about surgery. If she elects to proceed with surgery I would like a preoperative upper endoscopy and we can arrange this for her. I will obtain her previous GI records.    Plan:     As above

## 2012-03-26 NOTE — Patient Instructions (Signed)
Call if you have any questions. I will wait to hear about your decision regarding surgery.

## 2012-04-15 ENCOUNTER — Telehealth (INDEPENDENT_AMBULATORY_CARE_PROVIDER_SITE_OTHER): Payer: Self-pay

## 2012-04-15 NOTE — Telephone Encounter (Signed)
Return call to patient-- left message for Rachel Kelly to call our office at her convenience.  Patient not available at the time of my call.

## 2012-04-16 NOTE — Telephone Encounter (Signed)
Pt returning Christy's call.  She is ready to schedule surgery.  I told her that Dr. Johna Sheriff would like upper endo scheduled before her surgery.  She would like to speak with Neysa Bonito to go over details.  Please call her on her cell (not home) at [phone].

## 2012-04-22 ENCOUNTER — Telehealth (INDEPENDENT_AMBULATORY_CARE_PROVIDER_SITE_OTHER): Payer: Self-pay

## 2012-04-22 NOTE — Telephone Encounter (Signed)
Called Ms. Garmon to answer her questions- patient not available left voice message to call our office and ask to speak with [facility].

## 2012-04-28 ENCOUNTER — Ambulatory Visit: Payer: Medicare Other | Admitting: Internal Medicine

## 2012-04-30 ENCOUNTER — Encounter: Payer: Self-pay | Admitting: Internal Medicine

## 2012-04-30 ENCOUNTER — Ambulatory Visit (INDEPENDENT_AMBULATORY_CARE_PROVIDER_SITE_OTHER): Payer: Medicare Other | Admitting: Internal Medicine

## 2012-04-30 ENCOUNTER — Telehealth (INDEPENDENT_AMBULATORY_CARE_PROVIDER_SITE_OTHER): Payer: Self-pay

## 2012-04-30 VITALS — BP 118/72 | HR 82 | Ht 60.0 in | Wt 180.2 lb

## 2012-04-30 DIAGNOSIS — K449 Diaphragmatic hernia without obstruction or gangrene: Secondary | ICD-10-CM

## 2012-04-30 DIAGNOSIS — D869 Sarcoidosis, unspecified: Secondary | ICD-10-CM

## 2012-04-30 DIAGNOSIS — R0602 Shortness of breath: Secondary | ICD-10-CM

## 2012-04-30 DIAGNOSIS — G4733 Obstructive sleep apnea (adult) (pediatric): Secondary | ICD-10-CM

## 2012-04-30 NOTE — Patient Instructions (Addendum)
Order- schedule 6 MWT     Dx dyspnea             Schedule split protocol NPSG      Dx OSA

## 2012-04-30 NOTE — Telephone Encounter (Signed)
Spoke with patient, she would like to proceed with scheduling surgery of her Hiatal Hernia.  Patient would like to schedule this procedure after August 09, 2012 due to her schedule.  Office note from 03/26/12 indicates Dr. Johna Kelly would like Rachel Kelly to have Pre-Op Upper Endoscopy (written orders for Upper Endoscopy placed in INBOX for OR schedulers)  Patient aware that we will call to schedule Hiatal hernia Repair once Dr. Jamse Kelly OR schedule for January 2014 becomes available.

## 2012-04-30 NOTE — Progress Notes (Signed)
Patient ID: Rachel Kelly, female    DOB: 21-Apr-1946, 66 y.o.   MRN: [id number] PCP Dr Mirna Mires  HPI 12/27/10- 70 yoF never smoker followed for sarcoid, complicated by dyspnea, hiatal hernia/ GERD risk Last here September 26, 2010- note reviewed. Labs were updated from 12/11 visit. Since March says she has done very well, denying dyspnea, wheeze or night sweats Admits just a little dry cough occasionally- not concerned. No rash or nodes. She has lost some weight and would like to retest oxygen to see if she can stop O2 used now just at night. She did not desat on 6 MWT in December. DLCO was 53% then.   04/28/11-  65 yoF never smoker followed for sarcoid, complicated by dyspnea, hiatal hernia/ GERD risk She considers herself "low risk" and therefore declines flu vaccine.Overnight oximetry on room air 03/05/2011 recorded over 48 minutes with oxygen saturation less than or equal to 88%. We discussed home oxygen for sleep. She or he has home oxygen for sleep from [facility] at 2 L per minute but had hoped to come off. She understands that she should continue. She prefers to sleep on her stomach but has been told she snores. We discussed the possibility of sleep apnea given her body build, as a risk contributing to the oxygen situation. She is alone with no reporter. She is interested in having a sleep study done. She denies cough, wheeze, chest discomfort, fever, enlarged lymph nodes, or rash. PFT 07/08/2010-FEV1 1.22/68%, FEV1/FVC 0.80, small airway flows 56%. Insignificant response to bronchodilator. TLC 65%, DLCO 53%. CXR 06/28/10- CHEST - 2 VIEW  Comparison: 07/11/2009 chest radiograph.  Findings: Again seen is a large hiatal hernia with associated air-  fluid level. Cardiomegaly is stable. Mild prominence of the  inferior right paratracheal soft tissues are stable. There is some  scarring in the right perihilar region. No discrete focal airspace  disease is identified. Left lung base is  partially obscured by the  large hiatal hernia. No acute bony abnormality is identified.  IMPRESSION:  Stable chest radiograph. Mild stable chronic changes, likely  reflective of patient's known sarcoidosis.  Large hiatal hernia with air-fluid level.  Provider: Bud Face   01/13/12- 65 yoF never smoker followed for sarcoid, complicated by dyspnea, hiatal hernia/ GERD risk    Patient ID: Rachel Kelly, female    DOB: April 12, 1946, 66 y.o.   MRN: [id number] PCP Dr Mirna Mires  HPI 12/27/10- 51 yoF never smoker followed for sarcoid, complicated by dyspnea, hiatal hernia/ GERD risk Last here September 26, 2010- note reviewed. Labs were updated from 12/11 visit. Since March says she has done very well, denying dyspnea, wheeze or night sweats Admits just a little dry cough occasionally- not concerned. No rash or nodes. She has lost some weight and would like to retest oxygen to see if she can stop O2 used now just at night. She did not desat on 6 MWT in December. DLCO was 53% then.   04/28/11-  65 yoF never smoker followed for sarcoid, complicated by dyspnea, hiatal hernia/ GERD risk She considers herself "low risk" and therefore declines flu vaccine.Overnight oximetry on room air 03/05/2011 recorded over 48 minutes with oxygen saturation less than or equal to 88%. We discussed home oxygen for sleep. She or he has home oxygen for sleep from [facility] at 2 L per minute but had hoped to come off. She understands that she should continue. She prefers to sleep on her  stomach but has been told she snores. We discussed the possibility of sleep apnea given her body build, as a risk contributing to the oxygen situation. She is alone with no reporter. She is interested in having a sleep study done. She denies cough, wheeze, chest discomfort, fever, enlarged lymph nodes, or rash. PFT 07/08/2010-FEV1 1.22/68%, FEV1/FVC 0.80, small airway flows 56%. Insignificant response to bronchodilator. TLC 65%, DLCO  53%. CXR 06/28/10- CHEST - 2 VIEW  Comparison: 07/11/2009 chest radiograph.  Findings: Again seen is a large hiatal hernia with associated air-  fluid level. Cardiomegaly is stable. Mild prominence of the  inferior right paratracheal soft tissues are stable. There is some  scarring in the right perihilar region. No discrete focal airspace  disease is identified. Left lung base is partially obscured by the  large hiatal hernia. No acute bony abnormality is identified.  IMPRESSION:  Stable chest radiograph. Mild stable chronic changes, likely  reflective of patient's known sarcoidosis.  Large hiatal hernia with air-fluid level.  Provider: Bud Face   01/13/12- 65 yoF never smoker followed for sarcoid, complicated by dyspnea, hiatal hernia/ GERD risk ACUTE VISIT:  Increased SOB-recently gained weight but unsure if this is the cause of SOB; nothing has changed since last visit. Progressive shortness of breath is bothersome for 4 months, mainly with exertion of walking but not lying supine. Denies cough or wheeze, fever, night sweat, rash or adenopathy. 10 pound weight gain without edema. Has been anemic most of her life. Denies cardiac history and reports normal stress test 2 or 3 years ago.  03/15/12- 66 yoF never smoker followed for sarcoid, complicated by dyspnea, hiatal hernia/ GERD risk  Patient states about the same as last visit. Still c/o sob. States her weight is staying  steady.  D-dimer 1.29 led to CT chest: CT chest - 01/14/12-images reviewed with her IMPRESSION:  1. No pulmonary emboli.  2. Huge hiatal hernia which compresses the left lung, the heart,  and the bronchi.  2. Bronchitic changes which could be acute or chronic.  3. Multiple calcified lymph nodes in the mediastinum.  Original Report Authenticated By: Gwynn Burly, M.D.  Office Spirometry-within normal limits  04/30/12- 66 yoF never smoker followed for sarcoid, complicated by dyspnea, hiatal hernia/ GERD  risk.  Still having SOB even at rest. never got scheduled. We have reviewed her chest x-ray and CT scan with her and showed her the compression of lung expansion by her huge hiatal hernia. She has been evaluated by Dr. Johna Sheriff and now plans-hernia repair in January. Her exertional dyspnea at is no worse. Dyspnea is aggravated by bending over as expected. There is little cough and no acute change. She is using oxygen 2L/ [facility] at night and aware that she snores some but not told that she stops breathing  Review of Systems-see HPI Constitutional:   No-   weight loss, night sweats, fevers, chills, fatigue, lassitude. HEENT:   No-  headaches, difficulty swallowing, tooth/dental problems, sore throat,       No-  sneezing, itching, ear ache, nasal congestion, post nasal drip,  CV:  No-   chest pain, orthopnea, PND, swelling in lower extremities, anasarca,  dizziness, palpitations Resp: +  shortness of breath with exertion or at rest.              No-   productive cough,  + non-productive cough,  No-  coughing up of blood.  No-   change in color of mucus.  No- wheezing.   Skin: No-   rash or lesions. GI:  No-   heartburn, indigestion, abdominal pain, nausea, vomiting, ,  + bowels gurgle and loose"                 GU:  MS:  No-   joint pain or swelling.  Neuro- nothing unusual:  Psych:  No- change in mood or affect. No depression or anxiety.  No memory loss.  Objective:   Physical Exam General- Alert, Oriented, Affect-appropriate, Distress- none acute, overweight Skin- rash-none, lesions- none, excoriation- none Lymphadenopathy- none Head- atraumatic            Eyes- Gross vision intact, PERRLA, conjunctivae clear secretions            Ears- Hearing, canals-normal            Nose- Clear, no-Septal dev, mucus, polyps, erosion, perforation             Throat- Mallampati III , mucosa clear , drainage- none, tonsils- atrophic Neck- flexible , trachea midline, no stridor ,  thyroid nl, carotid no bruit Chest - symmetrical excursion , unlabored           Heart/CV- RRR , no murmur , no gallop  , no rub, nl s1 s2                           - JVD+ 1 CM/fill from above , edema- none, stasis changes- none, varices- none           Lung- clear to P&A/ +mildly diminished at left base, wheeze- none, cough- none , dullness-none, rub- none           Chest wall-  Abd-  Br/ Gen/ Rectal- Not done, not indicated Extrem- cyanosis- none, clubbing, none, atrophy- none, strength- nl, Neg Homan's Neuro- grossly intact to observation  Assessment & Plan:

## 2012-05-09 DIAGNOSIS — G4733 Obstructive sleep apnea (adult) (pediatric): Secondary | ICD-10-CM | POA: Insufficient documentation

## 2012-05-09 NOTE — Assessment & Plan Note (Signed)
Now planning hiatal hernia repair with Dr. Johna Sheriff in January.

## 2012-05-09 NOTE — Assessment & Plan Note (Signed)
Old sarcoid changes with no evidence of active disease now.

## 2012-05-09 NOTE — Assessment & Plan Note (Signed)
Plan-6 minute walk test 

## 2012-05-09 NOTE — Assessment & Plan Note (Signed)
Appropriate body habitus with history of snoring. This may be pertinent pending surgery. Plan schedule sleep study

## 2012-05-14 ENCOUNTER — Other Ambulatory Visit: Payer: Self-pay | Admitting: Gynecology

## 2012-05-26 ENCOUNTER — Ambulatory Visit (HOSPITAL_BASED_OUTPATIENT_CLINIC_OR_DEPARTMENT_OTHER): Payer: Medicare Other | Attending: Internal Medicine | Admitting: Radiology

## 2012-05-26 VITALS — Ht 60.0 in | Wt 176.0 lb

## 2012-05-26 DIAGNOSIS — G4733 Obstructive sleep apnea (adult) (pediatric): Secondary | ICD-10-CM | POA: Insufficient documentation

## 2012-05-26 DIAGNOSIS — G4737 Central sleep apnea in conditions classified elsewhere: Secondary | ICD-10-CM | POA: Insufficient documentation

## 2012-05-29 DIAGNOSIS — G4737 Central sleep apnea in conditions classified elsewhere: Secondary | ICD-10-CM

## 2012-05-29 DIAGNOSIS — G4733 Obstructive sleep apnea (adult) (pediatric): Secondary | ICD-10-CM

## 2012-05-29 NOTE — Procedures (Signed)
NAME:  Rachel Kelly, Rachel Kelly                ACCOUNT NO.:  [id number]  MEDICAL RECORD NO.:  [id number]          PATIENT TYPE:  OUT  LOCATION:  SLEEP CENTER                 FACILITY:  [facility]  PHYSICIAN:  Tomeka Kantner D. Maple Hudson, MD, FCCP, FACPDATE OF BIRTH:  [birth year] 14, 1945  DATE OF STUDY:  05/26/2012                           NOCTURNAL POLYSOMNOGRAM  REFERRING PHYSICIAN:  Kristyanna Barcelo D. Maple Hudson, MD, FCCP, FACP  REFERRING PHYSICIAN:  Hovanes Hymas D. Audianna Landgren, MD, FCCP, FACP  INDICATION FOR STUDY:  Hypersomnia with sleep apnea.  EPWORTH SLEEPINESS SCORE:  9/24.  BMI 34.4, weight 176 pounds, height 60 inches, neck 14 inches.  MEDICATIONS:  Home medications are charted and reviewed.  SLEEP ARCHITECTURE:  Total sleep time 350.5 minutes with sleep efficiency 90.7%.  Stage I was 3.4%, stage II 68.5%, stage III absent, REM 28.1% of total sleep time.  Sleep latency 3.5 minutes, REM latency 47.5 minutes, awake after sleep onset 27.5 minutes.  Arousal index 4.3. Bedtime medication:  None.  RESPIRATORY DATA:  Apnea/hypopnea index (AHI) 17.6 per hour.  A total of 103 events was scored including 17 obstructive apneas, 16 central apneas, 1 mixed apneas, 69 hypopneas.  Events were not positional.  REM AHI 55.4.  There were insufficient early events to meet protocol requirements for split protocol CPAP titration on this study night.  OXYGEN DATA:  Moderately loud snoring with oxygen desaturation to a nadir of 72% and mean oxygen saturation through the study of 92.3% on room air.  CARDIAC DATA:  Sinus rhythm with occasional PAC and PVC.  MOVEMENT/PARASOMNIA:  No significant movement disturbance.  Bathroom x1.  IMPRESSION/RECOMMENDATION: 1. Moderate obstructive and central sleep apnea/hypopnea syndrome, AHI     17.6 per hour with non-positional events.  Moderately loud snoring     with oxygen desaturation to a nadir of 72% and mean oxygen     saturation through the study of 92.3% on room air. 2. There were insufficient early  events to qualify for split protocol     CPAP titration on this study.  Consider return for dedicated CPAP     titration or evaluate for alternative management as clinically     appropriate.     Kimmi Acocella D. Maple Hudson, MD, [facility], FACP Diplomate, American Board of Sleep Medicine    CDY/MEDQ  D:  05/29/2012 12:06:01  T:  05/29/2012 22:51:08  Job:  [id number]

## 2012-06-03 ENCOUNTER — Ambulatory Visit: Payer: Medicare Other | Admitting: Internal Medicine

## 2012-06-03 ENCOUNTER — Ambulatory Visit: Payer: Medicare Other

## 2012-06-15 ENCOUNTER — Encounter (HOSPITAL_COMMUNITY): Payer: Self-pay | Admitting: Pharmacy Technician

## 2012-06-22 ENCOUNTER — Ambulatory Visit (INDEPENDENT_AMBULATORY_CARE_PROVIDER_SITE_OTHER): Payer: Medicare Other | Admitting: Internal Medicine

## 2012-06-22 ENCOUNTER — Encounter: Payer: Self-pay | Admitting: Internal Medicine

## 2012-06-22 VITALS — BP 120/76 | HR 90 | Ht 60.0 in | Wt 178.2 lb

## 2012-06-22 DIAGNOSIS — R0602 Shortness of breath: Secondary | ICD-10-CM

## 2012-06-22 DIAGNOSIS — D869 Sarcoidosis, unspecified: Secondary | ICD-10-CM

## 2012-06-22 DIAGNOSIS — G4733 Obstructive sleep apnea (adult) (pediatric): Secondary | ICD-10-CM

## 2012-06-22 NOTE — Progress Notes (Signed)
Patient ID: Rachel Kelly, female    DOB: 1945/09/10, 66 y.o.   MRN: [id number] PCP Dr [name]  HPI 12/27/10- 35 yoF never smoker followed for sarcoid, complicated by dyspnea, hiatal hernia/ GERD risk Last here September 26, 2010- note reviewed. Labs were updated from 12/11 visit. Since March says she has done very well, denying dyspnea, wheeze or night sweats Admits just a little dry cough occasionally- not concerned. No rash or nodes. She has lost some weight and would like to retest oxygen to see if she can stop O2 used now just at night. She did not desat on 6 MWT in December. DLCO was 53% then.   04/28/11-  65 yoF never smoker followed for sarcoid, complicated by dyspnea, hiatal hernia/ GERD risk She considers herself "low risk" and therefore declines flu vaccine.Overnight oximetry on room air 03/05/2011 recorded over 48 minutes with oxygen saturation less than or equal to 88%. We discussed home oxygen for sleep. She or he has home oxygen for sleep from [facility] at 2 L per minute but had hoped to come off. She understands that she should continue. She prefers to sleep on her stomach but has been told she snores. We discussed the possibility of sleep apnea given her body build, as a risk contributing to the oxygen situation. She is alone with no reporter. She is interested in having a sleep study done. She denies cough, wheeze, chest discomfort, fever, enlarged lymph nodes, or rash. PFT 07/08/2010-FEV1 1.22/68%, FEV1/FVC 0.80, small airway flows 56%. Insignificant response to bronchodilator. TLC 65%, DLCO 53%. CXR 06/28/10- CHEST - 2 VIEW  Comparison: 07/11/2009 chest radiograph.  Findings: Again seen is a large hiatal hernia with associated air-  fluid level. Cardiomegaly is stable. Mild prominence of the  inferior right paratracheal soft tissues are stable. There is some  scarring in the right perihilar region. No discrete focal airspace  disease is identified. Left lung base is  partially obscured by the  large hiatal hernia. No acute bony abnormality is identified.  IMPRESSION:  Stable chest radiograph. Mild stable chronic changes, likely  reflective of patient's known sarcoidosis.  Large hiatal hernia with air-fluid level.  Provider: Lelon Huh   01/13/12- 65 yoF never smoker followed for sarcoid, complicated by dyspnea, hiatal hernia/ GERD risk ACUTE VISIT:  Increased SOB-recently gained weight but unsure if this is the cause of SOB; nothing has changed since last visit. Progressive shortness of breath is bothersome for 4 months, mainly with exertion of walking but not lying supine. Denies cough or wheeze, fever, night sweat, rash or adenopathy. 10 pound weight gain without edema. Has been anemic most of her life. Denies cardiac history and reports normal stress test 2 or 3 years ago.  03/15/12- 66 yoF never smoker followed for sarcoid, complicated by dyspnea, hiatal hernia/ GERD risk  Patient states about the same as last visit. Still c/o sob. States her weight is staying  steady.  D-dimer 1.29 led to CT chest: CT chest - 01/14/12-images reviewed with her IMPRESSION:  1. No pulmonary emboli.  2. Huge hiatal hernia which compresses the left lung, the heart,  and the bronchi.  2. Bronchitic changes which could be acute or chronic.  3. Multiple calcified lymph nodes in the mediastinum.  Original Report Authenticated By: Larey Seat, M.D.  Office Spirometry-within normal limits  04/30/12- 29 yoF never smoker followed for sarcoid, complicated by dyspnea, hiatal hernia/ GERD risk.  Still having SOB even at rest. 6MW  never got scheduled. We have reviewed her chest x-ray and CT scan with her and showed her the compression of lung expansion by her huge hiatal hernia. She has been evaluated by Dr. Johna Sheriff and now plans-hernia repair in January. Her exertional dyspnea at is no worse. Dyspnea is aggravated by bending over as expected. There is little cough and no  acute change. She is using oxygen 2L/ [facility] at night and aware that she snores some but not told that she stops breathing  06/22/12- 66 yoF never smoker followed for sarcoid, complicated by dyspnea, hiatal hernia/ GERD risk FOLLOWS FOR: review test and Sleep study with patient She had upper endoscopy and is now pending surgical repair of her huge hiatal hernia after Christmas. No new respiratory issues. - 06/22/2012-94%, 88%, 98%, 417 m  Significant desaturation with exercise, but good distance walked.  NPSG- 05/26/12- AHI 17.6/ hr  Moderate obstructive sleep apnea, body weight 176 pounds   Review of Systems-see HPI Constitutional:   No-   weight loss, night sweats, fevers, chills, fatigue, lassitude. HEENT:   No-  headaches, difficulty swallowing, tooth/dental problems, sore throat,       No-  sneezing, itching, ear ache, nasal congestion, post nasal drip,  CV:  No-   chest pain, orthopnea, PND, swelling in lower extremities, anasarca,  dizziness, palpitations Resp: +  shortness of breath with exertion or at rest.              No-   productive cough,  + non-productive cough,  No-  coughing up of blood.              No-   change in color of mucus.  No- wheezing.   Skin: No-   rash or lesions. GI:  No-   heartburn, indigestion, abdominal pain, nausea, vomiting, ,  + bowels gurgle and loose"                 GU:  MS:  No-   joint pain or swelling.  Neuro- nothing unusual:  Psych:  No- change in mood or affect. No depression or anxiety.  No memory loss.  Objective:   Physical Exam General- Alert, Oriented, Affect-appropriate, Distress- none acute, overweight Skin- rash-none, lesions- none, excoriation- none Lymphadenopathy- none Head- atraumatic            Eyes- Gross vision intact, PERRLA, conjunctivae clear secretions            Ears- Hearing, canals-normal            Nose- Clear, no-Septal dev, mucus, polyps, erosion, perforation             Throat- Mallampati III ,  mucosa clear , drainage- none, tonsils- atrophic Neck- flexible , trachea midline, no stridor , thyroid nl, carotid no bruit Chest - symmetrical excursion , unlabored           Heart/CV- RRR , no murmur , no gallop  , no rub, nl s1 s2                           - JVD+ 1 CM , edema- none, stasis changes- none, varices- none           Lung- clear to P&A/ +mildly diminished at left base, wheeze- none, cough- none , dullness-none, rub- none           Chest wall-  Abd-  Br/ Gen/ Rectal- Not done, not  indicated Extrem- cyanosis- none, clubbing, none, atrophy- none, strength- nl,  Neuro- grossly intact to observation  Assessment & Plan:

## 2012-06-22 NOTE — Patient Instructions (Addendum)
Order- new CPAP  Auto 5-20 x 7 days for pressure recommendation  Mask of choice and supplies   Humidifier      Dx OSA     Continue O2 2L for sleep                [facility]

## 2012-06-24 ENCOUNTER — Encounter (HOSPITAL_COMMUNITY)
Admission: RE | Disposition: A | Discharge status: Home / Self Care | Payer: Self-pay | Source: Ambulatory Visit | Attending: Surgery

## 2012-06-24 ENCOUNTER — Encounter (HOSPITAL_COMMUNITY): Payer: Self-pay | Admitting: *Deleted

## 2012-06-24 ENCOUNTER — Telehealth: Payer: Self-pay | Admitting: Internal Medicine

## 2012-06-24 ENCOUNTER — Ambulatory Visit (HOSPITAL_COMMUNITY)
Admission: RE | Admit: 2012-06-24 | Discharge: 2012-06-24 | Disposition: A | Discharge status: Home / Self Care | Payer: Medicare Other | Source: Ambulatory Visit | Attending: Surgery | Admitting: Surgery

## 2012-06-24 DIAGNOSIS — E785 Hyperlipidemia, unspecified: Secondary | ICD-10-CM | POA: Insufficient documentation

## 2012-06-24 DIAGNOSIS — K449 Diaphragmatic hernia without obstruction or gangrene: Secondary | ICD-10-CM

## 2012-06-24 DIAGNOSIS — G4733 Obstructive sleep apnea (adult) (pediatric): Secondary | ICD-10-CM

## 2012-06-24 DIAGNOSIS — R0602 Shortness of breath: Secondary | ICD-10-CM | POA: Insufficient documentation

## 2012-06-24 DIAGNOSIS — Z79899 Other long term (current) drug therapy: Secondary | ICD-10-CM | POA: Insufficient documentation

## 2012-06-24 DIAGNOSIS — I10 Essential (primary) hypertension: Secondary | ICD-10-CM | POA: Insufficient documentation

## 2012-06-24 HISTORY — DX: Shortness of breath: R06.02

## 2012-06-24 HISTORY — DX: Sleep apnea, unspecified: G47.30

## 2012-06-24 HISTORY — PX: ESOPHAGOGASTRODUODENOSCOPY: SHX5428

## 2012-06-24 SURGERY — EGD (ESOPHAGOGASTRODUODENOSCOPY)
Anesthesia: Moderate Sedation

## 2012-06-24 MED ORDER — FENTANYL CITRATE 0.05 MG/ML IJ SOLN
INTRAMUSCULAR | Status: AC
  Filled 2012-06-24: qty 4

## 2012-06-24 MED ORDER — DIPHENHYDRAMINE HCL 50 MG/ML IJ SOLN
INTRAMUSCULAR | Status: AC
  Filled 2012-06-24: qty 1

## 2012-06-24 MED ORDER — BUTAMBEN-TETRACAINE-BENZOCAINE 2-2-14 % EX AERO
INHALATION_SPRAY | CUTANEOUS | Status: DC | PRN
  Administered 2012-06-24: 3 via TOPICAL

## 2012-06-24 MED ORDER — MIDAZOLAM HCL 10 MG/2ML IJ SOLN
INTRAMUSCULAR | Status: AC
  Filled 2012-06-24: qty 4

## 2012-06-24 MED ORDER — MIDAZOLAM HCL 10 MG/2ML IJ SOLN
INTRAMUSCULAR | Status: DC | PRN
  Administered 2012-06-24: 1 mg via INTRAVENOUS
  Administered 2012-06-24: 2 mg via INTRAVENOUS

## 2012-06-24 MED ORDER — FENTANYL CITRATE 0.05 MG/ML IJ SOLN
INTRAMUSCULAR | Status: DC | PRN
  Administered 2012-06-24 (×2): 25 ug via INTRAVENOUS

## 2012-06-24 MED ORDER — SODIUM CHLORIDE 0.9 % IV SOLN
INTRAVENOUS | Status: DC
  Administered 2012-06-24: 500 mL via INTRAVENOUS

## 2012-06-24 NOTE — Op Note (Signed)
06/24/2012  9:47 AM  PATIENT:  Rachel Kelly, 66 y.o., female, MRN: [id number]  PREOP DIAGNOSIS:  Large hiatal hernia with stomach in left chest  POSTOP DIAGNOSIS:   Large hiatal hernia with stomach in left chest, EG junction at 33 cm  PROCEDURE:  Esophagogastroscopy  SURGEON:   Ovidio Kin, M.D.  ANESTHESIA:   Fentanyl  50 mcg  Versed 3 mg  INDICATIONS FOR PROCEDURE:  Rachel Kelly is a 66 y.o. (DOB: 1945/12/31)  AA female whose primary care physician is HILL,GERALD K, MD and comes for upper endoscopy to evaluate a large hiatal hernia. She has seen Dr. Leonard Schwartz. [name] and is contemplating repairing this hernia.   The indications and risks of the endoscopy were explained to the patient.  The risks include, but are not limited to, perforation, bleeding, or injury to the bowel.  PROCEDURE:  The patient was monitored with a pulse oximetry, BP cuff, and EKG.  The patient has nasal O2 flowing during the procedure.   The back of the throat was anesthestized with Ceticaine.  A flexible Pentax endoscope was passed down the throat without difficulty.  Findings include:   Esophagus:   Normal   GE junction at:  33 cm   Stomach: Goes to left up in left chest,  I could see the antrum, but not the pylorus.  Advancing the scope basically curled up in the stomach and irritated the diaphragm.  I could not advance to the duodenum.  The gastric mucosa was otherwise normal.   Duodenum:   Did not cannulate  PLAN:  Return to see Dr. Johna Sheriff later this month to discuss surgery.

## 2012-06-24 NOTE — H&P (Signed)
Name:  Rachel Kelly MRN:  [id number]  HPI  The patient is a very pleasant 66 year old female referred by Dr. Jetty Duhamel for findings of a large hiatal hernia. The patient has a fairly long history of dyspnea which was recently reevaluated by Dr. Maple Hudson. She has a history of sarcoidosis but this has been inactive. She had a CT angiogram of her chest as part of this evaluation. I have personally reviewed this study which shows a extremely large hiatal hernia containing the entire stomach, a significant part of the transverse colon and portions of the liver and pancreas. This causes some significant compression of the left lung as well as some displacement of the heart. She has had a thorough evaluation by Dr. Maple Hudson out other definite cause of her shortness of breath and it is felt that the hernia is likely the main source of her symptoms.   She has known she had a hiatal hernia from imaging that was done some years ago. She however denies any chest or abdominal pain, dysphagia or symptoms to suggest reflux. She does get some borborygmi in her upper abdomen and chest at night. She however is remarkably symptom-free in terms of GI problems. She gets moderate shortness of breath with exertion and has had in the past low O2 saturations requiring home oxygen but this is a little better recently. Her general health has below is otherwise reasonably good. She has had a GI evaluation in the past with colonoscopy and endoscopy a number of years ago but she cannot recall the exact indication. His may have just been screening.    Past Medical History   Diagnosis  Date   .  Hyperlipidemia    .  Hypertension    .  Sarcoidosis    .  PAH (pulmonary artery hypertension)    .  HH (hiatus hernia)    .  Hernia    .  Anemia    .  Blood transfusion    .  Wheezing     Past Surgical History   Procedure  Date   .  Back surgery  2005   .  Vesicovaginal fistula closure w/ tah    .  Abdominal hysterectomy  1988     Current Outpatient Prescriptions   Medication  Sig  Dispense  Refill   .  Dermatological Products, Misc. (NUVAIL) SOLN      .  Coenzyme Q10 (CO Q 10) 100 MG CAPS  Take 1 capsule by mouth daily.     .  magnesium gluconate (MAGONATE) 500 MG tablet  Take 500 mg by mouth daily.     .  potassium gluconate 595 MG TABS  Take 595 mg by mouth daily.     .  simvastatin (ZOCOR) 20 MG tablet  Take 20 mg by mouth daily.     Marland Kitchen  terbinafine (LAMISIL) 250 MG tablet  Take 1 tablet by mouth daily.     Marland Kitchen  triamterene-hydrochlorothiazide (MAXZIDE-25) 37.5-25 MG per tablet  Take 1 tablet by mouth daily.     .  urea (CARMOL) 40 % CREA  Apply topically Daily.      Allergies   Allergen  Reactions   .  Penicillins  Rash     Fine red bumps that spread all over the body.    History   Substance Use Topics   .  Smoking status:  Never Smoker   .  Smokeless tobacco:  Never Used   .  Alcohol Use:  No   She is a retired Runner, broadcasting/film/video. She lives with and cares for her elderly mother.    Review of Systems  Constitutional: Negative for fever and chills.  HENT: Negative.  Respiratory: Positive for shortness of breath. Negative for cough, choking, chest tightness, wheezing and stridor.  Cardiovascular: Negative.  Gastrointestinal: Positive for diarrhea. Negative for vomiting, abdominal pain, constipation, blood in stool, abdominal distention and anal bleeding.  Genitourinary: Negative.  Musculoskeletal: Negative.  Psychiatric/Behavioral: Negative.   Objective:   Physical Exam  BP 111/82  Temp 97.6 F (36.4 C) (Oral)  Resp 18  Ht 5' (1.524 m)  Wt 178 lb (80.74 kg)  BMI 34.76 kg/m2  SpO2 98%  General: Alert, somewhat obese [place] American female, in no distress  Skin: Warm and dry without rash or infection.  HEENT: No palpable masses or thyromegaly. Sclera nonicteric. Pupils equal round and reactive. Oropharynx clear.  Lymph nodes: No cervical, supraclavicular, or inguinal nodes palpable.  Lungs: Breath  sounds somewhat decreased in the left base without increased work of breathing  Cardiovascular: Regular rate and rhythm without murmur. No JVD or edema. Peripheral pulses intact.  Abdomen: Nondistended. Soft and nontender. No masses palpable. No organomegaly. No palpable hernias. Well-healed low midline incision  Extremities: No edema or joint swelling or deformity. No chronic venous stasis changes.  Neurologic: Alert and fully oriented. Gait normal.   Assessment:   Massive hiatal hernia containing the entire stomach as well as portions of the colon and pancreas. She is remarkably free of any GI symptoms. She has shortness of breath with an otherwise negative workup and I think this may well be contributing to her symptoms. If she elects to proceed with surgery I would like a preoperative upper endoscopy and we can arrange this for her. I will obtain her previous GI records.   She is here today (06/24/2012) for her preop upped endoscopy.  I have explained the procedure with her and Ms. Yetta Barre.  She sees Dr. Mirna Mires as her PCP. She has a friend with her, Linden Dolin.  Plan:   As above

## 2012-06-24 NOTE — Telephone Encounter (Signed)
Called and spoke with pt and she stated that her insurance will change come jan 1 and [facility] pt explained to her that there is no need to get set up with them if she will just have to change companies.  i explained to the pt that we will send in new order for her cpap to a new company.  Pt voiced her understanding and nothing further is needed.

## 2012-06-25 ENCOUNTER — Encounter (HOSPITAL_COMMUNITY): Payer: Self-pay | Admitting: Surgery

## 2012-06-25 ENCOUNTER — Telehealth (INDEPENDENT_AMBULATORY_CARE_PROVIDER_SITE_OTHER): Payer: Self-pay

## 2012-06-25 NOTE — Telephone Encounter (Signed)
The pt happened to call in to schedule her January surgery and I was going to call her and check on her s/p endoscopy yesterday.  She states she is doing well.  I transferred her call to Coy Saunas to have her help with scheduling.

## 2012-06-30 ENCOUNTER — Telehealth: Payer: Self-pay | Admitting: Internal Medicine

## 2012-07-01 NOTE — Telephone Encounter (Signed)
Order faxed to [facility] to start providing cpap and 02 supplies July 21 2012 because of insurance change pt aware Rachel Kelly

## 2012-07-03 NOTE — Assessment & Plan Note (Addendum)
Moderate obstructive sleep apnea. Oxygen desaturation during the night can be reassessed after hiatal hernia repair to see if she needs portable oxygen. For now we will add CPAP. Plan-CPAP autotitration [facility] (?) DME. She should wear CPAP when sleeping at time of surgery.

## 2012-07-03 NOTE — Assessment & Plan Note (Signed)
Multifactorial dyspnea. Getting her large hiatal hernia reduced will require some surgical revision of the diaphragm defect. Postoperative effect on her breathing isn't clear but she is likely to do much better with better lung expansion.

## 2012-07-03 NOTE — Assessment & Plan Note (Signed)
Past history of sarcoid in clinical remission.

## 2012-07-03 NOTE — Progress Notes (Signed)
Documentation for 6 minute walk test 

## 2012-07-19 ENCOUNTER — Ambulatory Visit: Payer: Medicare Other | Admitting: Internal Medicine

## 2012-07-19 ENCOUNTER — Ambulatory Visit: Payer: Medicare Other

## 2012-08-02 ENCOUNTER — Telehealth: Payer: Self-pay | Admitting: Internal Medicine

## 2012-08-02 ENCOUNTER — Ambulatory Visit (INDEPENDENT_AMBULATORY_CARE_PROVIDER_SITE_OTHER): Payer: Medicare Other | Admitting: Internal Medicine

## 2012-08-02 ENCOUNTER — Encounter: Payer: Self-pay | Admitting: Internal Medicine

## 2012-08-02 VITALS — BP 124/72 | HR 86 | Ht 60.0 in | Wt 176.6 lb

## 2012-08-02 DIAGNOSIS — D869 Sarcoidosis, unspecified: Secondary | ICD-10-CM

## 2012-08-02 DIAGNOSIS — R0602 Shortness of breath: Secondary | ICD-10-CM

## 2012-08-02 DIAGNOSIS — M546 Pain in thoracic spine: Secondary | ICD-10-CM

## 2012-08-02 DIAGNOSIS — G4733 Obstructive sleep apnea (adult) (pediatric): Secondary | ICD-10-CM

## 2012-08-02 DIAGNOSIS — K449 Diaphragmatic hernia without obstruction or gangrene: Secondary | ICD-10-CM

## 2012-08-02 NOTE — Progress Notes (Signed)
Patient ID: Rachel Kelly, female    DOB: 1945/09/10, 67 y.o.   MRN: [id number] PCP Dr [name]  HPI 12/27/10- 35 yoF never smoker followed for sarcoid, complicated by dyspnea, hiatal hernia/ GERD risk Last here September 26, 2010- note reviewed. Labs were updated from 12/11 visit. Since March says she has done very well, denying dyspnea, wheeze or night sweats Admits just a little dry cough occasionally- not concerned. No rash or nodes. She has lost some weight and would like to retest oxygen to see if she can stop O2 used now just at night. She did not desat on 6 MWT in December. DLCO was 53% then.   04/28/11-  65 yoF never smoker followed for sarcoid, complicated by dyspnea, hiatal hernia/ GERD risk She considers herself "low risk" and therefore declines flu vaccine.Overnight oximetry on room air 03/05/2011 recorded over 48 minutes with oxygen saturation less than or equal to 88%. We discussed home oxygen for sleep. She or he has home oxygen for sleep from [facility] at 2 L per minute but had hoped to come off. She understands that she should continue. She prefers to sleep on her stomach but has been told she snores. We discussed the possibility of sleep apnea given her body build, as a risk contributing to the oxygen situation. She is alone with no reporter. She is interested in having a sleep study done. She denies cough, wheeze, chest discomfort, fever, enlarged lymph nodes, or rash. PFT 07/08/2010-FEV1 1.22/68%, FEV1/FVC 0.80, small airway flows 56%. Insignificant response to bronchodilator. TLC 65%, DLCO 53%. CXR 06/28/10- CHEST - 2 VIEW  Comparison: 07/11/2009 chest radiograph.  Findings: Again seen is a large hiatal hernia with associated air-  fluid level. Cardiomegaly is stable. Mild prominence of the  inferior right paratracheal soft tissues are stable. There is some  scarring in the right perihilar region. No discrete focal airspace  disease is identified. Left lung base is  partially obscured by the  large hiatal hernia. No acute bony abnormality is identified.  IMPRESSION:  Stable chest radiograph. Mild stable chronic changes, likely  reflective of patient's known sarcoidosis.  Large hiatal hernia with air-fluid level.  Provider: Lelon Huh   01/13/12- 65 yoF never smoker followed for sarcoid, complicated by dyspnea, hiatal hernia/ GERD risk ACUTE VISIT:  Increased SOB-recently gained weight but unsure if this is the cause of SOB; nothing has changed since last visit. Progressive shortness of breath is bothersome for 4 months, mainly with exertion of walking but not lying supine. Denies cough or wheeze, fever, night sweat, rash or adenopathy. 10 pound weight gain without edema. Has been anemic most of her life. Denies cardiac history and reports normal stress test 2 or 3 years ago.  03/15/12- 66 yoF never smoker followed for sarcoid, complicated by dyspnea, hiatal hernia/ GERD risk  Patient states about the same as last visit. Still c/o sob. States her weight is staying  steady.  D-dimer 1.29 led to CT chest: CT chest - 01/14/12-images reviewed with her IMPRESSION:  1. No pulmonary emboli.  2. Huge hiatal hernia which compresses the left lung, the heart,  and the bronchi.  2. Bronchitic changes which could be acute or chronic.  3. Multiple calcified lymph nodes in the mediastinum.  Original Report Authenticated By: Larey Seat, M.D.  Office Spirometry-within normal limits  04/30/12- 29 yoF never smoker followed for sarcoid, complicated by dyspnea, hiatal hernia/ GERD risk.  Still having SOB even at rest. 6MW  never got scheduled. We have reviewed her chest x-ray and CT scan with her and showed her the compression of lung expansion by her huge hiatal hernia. She has been evaluated by Dr. Johna Sheriff and now plans-hernia repair in January. Her exertional dyspnea at is no worse. Dyspnea is aggravated by bending over as expected. There is little cough and no  acute change. She is using oxygen 2L/ [facility] at night and aware that she snores some but not told that she stops breathing  06/22/12- 66 yoF never smoker followed for sarcoid, complicated by dyspnea, hiatal hernia/ GERD risk FOLLOWS FOR: review test and Sleep study with patient She had upper endoscopy and is now pending surgical repair of her huge hiatal hernia after Christmas. No new respiratory issues. - 06/22/2012-94%, 88%, 98%, 417 m  Significant desaturation with exercise, but good distance walked.  NPSG- 05/26/12- AHI 17.6/ hr  Moderate obstructive sleep apnea, body weight 176 pounds  08/02/12- 66 yoF never smoker followed for sarcoid, complicated by dyspnea, hiatal hernia/ GERD risk FOLLOWS FOR: review test and Sleep study with patient To see Dr. Johna Sheriff  planning hiatal hernia repair January 29. Just got CPAP/[facility] with O2 2 L for sleep. Noting sharp muscles thousand type pains in mid thoracic spine area, increased by leaning forward and getting more frequent. Possibly related to her hernia. No change dyspnea with exertion, no fever, cough, night sweats, palpitation or nodes.  Review of Systems-see HPI Constitutional:   No-   weight loss, night sweats, fevers, chills, fatigue, lassitude. HEENT:   No-  headaches, difficulty swallowing, tooth/dental problems, sore throat,       No-  sneezing, itching, ear ache, nasal congestion, post nasal drip,  CV:  No-   chest pain, orthopnea, PND, swelling in lower extremities, anasarca,  dizziness, palpitations Resp: +  shortness of breath with exertion or at rest.              No-   productive cough,  + non-productive cough,  No-  coughing up of blood.              No-   change in color of mucus.  No- wheezing.   Skin: No-   rash or lesions. GI:  No-   heartburn, indigestion, abdominal pain, nausea, vomiting, ,  + bowels gurgle and loose" GU:  MS:  No-   joint pain or swelling.  Neuro- nothing unusual:  Psych:  No- change in  mood or affect. No depression or anxiety.  No memory loss.  Objective:   Physical Exam General- Alert, Oriented, Affect-, and friendly, Distress- none acute, overweight Skin- rash-none, lesions- none, excoriation- none Lymphadenopathy- none Head- atraumatic            Eyes- Gross vision intact, PERRLA, conjunctivae clear secretions            Ears- Hearing, canals-normal            Nose- Clear, no-Septal dev, mucus, polyps, erosion, perforation             Throat- Mallampati III , mucosa clear , drainage- none, tonsils- atrophic Neck- flexible , trachea midline, no stridor , thyroid nl, carotid no bruit Chest - symmetrical excursion , unlabored           Heart/CV- RRR , no murmur , no gallop  , no rub, nl s1 s2                           -  JVD+ 1 CM , edema- none, stasis changes- none, varices- none           Lung- clear to P&A/ +mildly diminished at left base, wheeze- none, cough- none , dullness-none, rub- none           Chest wall-  Abd-  Br/ Gen/ Rectal- Not done, not indicated Extrem- cyanosis- none, clubbing, none, atrophy- none, strength- nl,  Neuro- grossly intact to observation  Assessment & Plan:

## 2012-08-02 NOTE — Patient Instructions (Addendum)
You are stable for planned surgery from a pulmonary standpoint.  You can ask Dr Johna Sheriff if he feels the sharp pain in your midback has anything to do with your hernia. It may be arthritis change in your spine.  We can continue CPAP autotitration/ [facility] .  When you go to the hospital, you can take your own mask, with a name tag on it. You will not be able to take your own machine, but the hospital will supply one

## 2012-08-02 NOTE — Telephone Encounter (Signed)
Per CY-okay to go ahead and order ONO but patient may not need O2 after her surgery. ATC [facility] but they are closed for the day-will need to call back tomorrow morning.

## 2012-08-02 NOTE — Telephone Encounter (Signed)
Per Bjorn Loser ONO RA CPAP is to qualify pt for oxygen. Please advise if okay to order Dr. Maple Hudson thanks

## 2012-08-02 NOTE — Telephone Encounter (Signed)
Per CY--we will hold off for ONO until after her surgery. Called and spoke with Larita Fife at [facility]-she states that without ONO they can not bill her insurance and patient will have to pay out of pocket for O2. Please advise. Thanks.

## 2012-08-03 NOTE — Telephone Encounter (Signed)
Larita Fife aware order placed to come to them for ONO on patient.

## 2012-08-05 ENCOUNTER — Ambulatory Visit (INDEPENDENT_AMBULATORY_CARE_PROVIDER_SITE_OTHER): Payer: Medicare Other | Admitting: General Surgery

## 2012-08-05 VITALS — BP 120/80 | HR 64 | Temp 97.9°F | Resp 18 | Ht 60.0 in | Wt 174.0 lb

## 2012-08-05 DIAGNOSIS — K449 Diaphragmatic hernia without obstruction or gangrene: Secondary | ICD-10-CM

## 2012-08-05 NOTE — Progress Notes (Signed)
Chief complaint: Hiatal hernia shortness of breath  History: Patient returns for a preop visit prior to planned laparoscopic repair of her large hiatal hernia.  The patient is a very pleasant 67 year old female referred by Dr. Jetty Duhamel for findings of a large hiatal hernia. The patient has a fairly long history of dyspnea which was recently reevaluated by Dr. Maple Hudson. She has a history of sarcoidosis but this has been inactive. She had a CT angiogram of her chest as part of this evaluation. I have personally reviewed this study which shows a extremely large hiatal hernia containing the entire stomach, a significant part of the transverse colon and portions of the liver and pancreas. This causes some significant compression of the left lung as well as some displacement of the heart. She has had a thorough evaluation by Dr. Maple Hudson out other definite cause of her shortness of breath and it is felt that the hernia is likely the main source of her symptoms. She has known she had a hiatal hernia from imaging that was done some years ago. She however denies any chest or abdominal pain, dysphagia or symptoms to suggest reflux. She does get some borborygmi in her upper abdomen and chest at night. She however is remarkably symptom-free in terms of GI problems. She gets moderate shortness of breath with exertion and has had in the past low O2 saturations requiring home oxygen but this is a little better recently. Her general health has below is otherwise reasonably good. She has had a GI evaluation in the past with colonoscopy and endoscopy a number of years ago but she cannot recall the exact indication.  She recently was started on CPAP at night by Dr. Maple Hudson. I originally evaluated her in September and after consideration she has elected to proceed with surgery. She has been having some occasional back pain between her shoulder blades it feels like a muscle spasm but otherwise no chest or abdominal pain or GI symptoms  appear  Past Medical History  Diagnosis Date  . Hyperlipidemia   . Hypertension   . Sarcoidosis   . PAH (pulmonary artery hypertension)   . HH (hiatus hernia)   . Hernia   . Anemia   . Blood transfusion   . Wheezing   . Shortness of breath     on exertion  . Sleep apnea     on o2 at night   Past Surgical History  Procedure Date  . Back surgery 2005  . Vesicovaginal fistula closure w/ tah   . Abdominal hysterectomy 1988  . Esophagogastroduodenoscopy 06/24/2012    Procedure: ESOPHAGOGASTRODUODENOSCOPY (EGD);  Surgeon: Kandis Cocking, MD;  Location: [facility];  Service: General;  Laterality: N/A;   Current Outpatient Prescriptions  Medication Sig Dispense Refill  . acetaminophen (TYLENOL) 500 MG tablet Take 1,000 mg by mouth every 6 (six) hours as needed. Pain      . Carboxymethylcellul-Glycerin (OPTIVE) 0.5-0.9 % SOLN Apply 1 drop to eye 2 (two) times daily as needed. Dry eyes      . Cholecalciferol (VITAMIN D) 2000 UNITS CAPS Take 1 capsule by mouth daily.      . Coenzyme Q10 200 MG TABS Take 200 mg by mouth daily.      Marland Kitchen ketotifen (ALAWAY) 0.025 % ophthalmic solution Place 1 drop into both eyes 2 (two) times daily.      . Magnesium 500 MG TABS Take 500 mg by mouth daily.      . potassium gluconate 595 MG TABS  Take 595 mg by mouth daily.        . simvastatin (ZOCOR) 20 MG tablet Take 20 mg by mouth every morning.       . triamterene-hydrochlorothiazide (MAXZIDE-25) 37.5-25 MG per tablet Take 1 tablet by mouth every morning.       . urea (CARMOL) 40 % CREA Apply as directed       Allergies  Allergen Reactions  . Penicillins Rash    Fine red bumps that spread all over the body.   History  Substance Use Topics  . Smoking status: Never Smoker   . Smokeless tobacco: Never Used  . Alcohol Use: No  Exam: General: Alert, somewhat obese African American female, in no distress  BP 120/80  Pulse 64  Temp 97.9 F (36.6 C)  Resp 18  Ht 5' (1.524 m)  Wt 174 lb (78.926 kg)   BMI 33.98 kg/m2  Skin: Warm and dry without rash or infection.  HEENT: No palpable masses or thyromegaly. Sclera nonicteric. Pupils equal round and reactive. Oropharynx clear.  Lymph nodes: No cervical, supraclavicular, or inguinal nodes palpable.  Lungs: Breath sounds somewhat decreased in the left base without increased work of breathing  Cardiovascular: Regular rate and rhythm without murmur. No JVD or edema. Peripheral pulses intact.  Abdomen: Nondistended. Soft and nontender. No masses palpable. No organomegaly. No palpable hernias. Well-healed low midline incision  Extremities: No edema or joint swelling or deformity. No chronic venous stasis changes.  Neurologic: Alert and fully oriented. Gait normal    Assessment and plan: Massive hiatal hernia containing the entire stomach as well as portions of the colon and pancreas. She has significant dyspnea with exertion and a negative workup otherwise and the feeling Scher by Dr. Maple Hudson is that her large hernia is contributing to her pulmonary issues. She has had time to think this over and would like to proceed with repair. We again discussed repair in detail including the indications and its nature and expected recovery and potential for improvement. Risks of anesthetic complications, bleeding, infection, visceral injury, recurrent hernia were all discussed and understood. All questions were answered. She scheduled for later this month.

## 2012-08-10 ENCOUNTER — Encounter (HOSPITAL_COMMUNITY): Payer: Self-pay | Admitting: Pharmacy Technician

## 2012-08-12 ENCOUNTER — Encounter (HOSPITAL_COMMUNITY): Payer: Self-pay

## 2012-08-12 ENCOUNTER — Encounter (HOSPITAL_COMMUNITY)
Admission: RE | Admit: 2012-08-12 | Discharge: 2012-08-12 | Disposition: A | Discharge status: Home / Self Care | Payer: Medicare Other | Source: Ambulatory Visit | Attending: General Surgery | Admitting: General Surgery

## 2012-08-12 ENCOUNTER — Other Ambulatory Visit (HOSPITAL_COMMUNITY): Payer: Self-pay | Admitting: General Surgery

## 2012-08-12 LAB — BASIC METABOLIC PANEL
BUN: 17 mg/dL (ref 6–23)
CO2: 30 mEq/L (ref 19–32)
Calcium: 9.2 mg/dL (ref 8.4–10.5)
Chloride: 103 mEq/L (ref 96–112)
Creatinine, Ser: 1.3 mg/dL — ABNORMAL HIGH (ref 0.50–1.10)

## 2012-08-12 LAB — CBC
Hemoglobin: 12.2 g/dL (ref 12.0–15.0)
MCH: 29.7 pg (ref 26.0–34.0)
MCHC: 32.4 g/dL (ref 30.0–36.0)
MCV: 91.7 fL (ref 78.0–100.0)
RBC: 4.11 MIL/uL (ref 3.87–5.11)

## 2012-08-12 NOTE — Patient Instructions (Addendum)
[name]  08/12/2012   Your procedure is scheduled on: 08-18-2012  Report to [facility] at 0630 AM.  Call this number if you have problems the morning of surgery [phone]   Remember:   Do not eat food or drink liquids :After Midnight.     Take these medicines the morning of surgery with A SIP OF WATER: eye drops, simvastatin                                SEE [place] [place] [facility] PREPARING FOR SURGERY SHEET   Do not wear jewelry, make-up or nail polish.  Do not wear lotions, powders, or perfumes. You may wear deodorant.   Men may shave face and neck.  Do not bring valuables to the hospital.  Contacts, dentures or bridgework may not be worn into surgery.  Leave suitcase in the car. After surgery it may be brought to your room.  For patients admitted to the hospital, checkout time is 11:00 AM the day of discharge.   Patients discharged the day of surgery will not be allowed to drive home.  Name and phone number of your driver:  Special Instructions: N/A   Please read over the following fact sheets that you were given: MRSA Information.  Call Cain Sieve RN pre op nurse if needed [phone]    FAILURE TO FOLLOW THESE INSTRUCTIONS MAY RESULT IN THE CANCELLATION OF YOUR SURGERY. PATIENT SIGNATURE___________________________________________

## 2012-08-12 NOTE — Progress Notes (Signed)
Chest ct 01-11-12 epic

## 2012-08-13 NOTE — Progress Notes (Signed)
bmet results routed to dr Lillard Anes by epic to inbox

## 2012-08-14 DIAGNOSIS — M546 Pain in thoracic spine: Secondary | ICD-10-CM | POA: Insufficient documentation

## 2012-08-14 NOTE — Assessment & Plan Note (Signed)
Remains in remission

## 2012-08-14 NOTE — Assessment & Plan Note (Addendum)
Episodic sharp mid back spasms mainly noted when she leans over. Without peripheral radiation I don't know if this is a thoracic spine problem with nerve root irritation but suspect it is radiation from compression of her hiatal hernia when she leans forward. Plan-she understands to discuss this with her doctors especially if it gets worse.

## 2012-08-14 NOTE — Assessment & Plan Note (Signed)
Her pulmonary status is acceptable to go forward with necessary surgery.

## 2012-08-14 NOTE — Assessment & Plan Note (Signed)
She will continue CPAP with AutoPap for now, supplementing oxygen 2 L for sleep. She will need to continue this while in hospital for surgery.

## 2012-08-17 MED ORDER — CIPROFLOXACIN IN D5W 400 MG/200ML IV SOLN
400.0000 mg | INTRAVENOUS | Status: AC
  Administered 2012-08-18: 400 mg via INTRAVENOUS

## 2012-08-18 ENCOUNTER — Encounter (HOSPITAL_COMMUNITY): Payer: Self-pay

## 2012-08-18 ENCOUNTER — Inpatient Hospital Stay (HOSPITAL_COMMUNITY)
Admission: RE | Admit: 2012-08-18 | Discharge: 2012-08-20 | DRG: 328 | Disposition: A | Discharge status: Home / Self Care | Payer: Medicare Other | Source: Ambulatory Visit | Attending: General Surgery | Admitting: General Surgery

## 2012-08-18 ENCOUNTER — Encounter (HOSPITAL_COMMUNITY): Payer: Self-pay | Admitting: Anesthesiology

## 2012-08-18 ENCOUNTER — Ambulatory Visit (HOSPITAL_COMMUNITY): Payer: Medicare Other | Admitting: Anesthesiology

## 2012-08-18 ENCOUNTER — Encounter (HOSPITAL_COMMUNITY)
Admission: RE | Disposition: A | Discharge status: Home / Self Care | Payer: Self-pay | Source: Ambulatory Visit | Attending: General Surgery

## 2012-08-18 DIAGNOSIS — I10 Essential (primary) hypertension: Secondary | ICD-10-CM | POA: Diagnosis present

## 2012-08-18 DIAGNOSIS — K449 Diaphragmatic hernia without obstruction or gangrene: Principal | ICD-10-CM

## 2012-08-18 DIAGNOSIS — Z01812 Encounter for preprocedural laboratory examination: Secondary | ICD-10-CM

## 2012-08-18 DIAGNOSIS — R0602 Shortness of breath: Secondary | ICD-10-CM | POA: Diagnosis present

## 2012-08-18 HISTORY — PX: HIATAL HERNIA REPAIR: SHX195

## 2012-08-18 HISTORY — PX: INSERTION OF MESH: SHX5868

## 2012-08-18 SURGERY — REPAIR, HERNIA, HIATAL, LAPAROSCOPIC
Anesthesia: General | Site: Esophagus | Wound class: Clean Contaminated

## 2012-08-18 MED ORDER — CIPROFLOXACIN IN D5W 400 MG/200ML IV SOLN
INTRAVENOUS | Status: AC
  Filled 2012-08-18: qty 200

## 2012-08-18 MED ORDER — POLYVINYL ALCOHOL 1.4 % OP SOLN
1.0000 [drp] | Freq: Three times a day (TID) | OPHTHALMIC | Status: DC | PRN
  Filled 2012-08-18: qty 15

## 2012-08-18 MED ORDER — FENTANYL CITRATE 0.05 MG/ML IJ SOLN
INTRAMUSCULAR | Status: AC
  Filled 2012-08-18: qty 2

## 2012-08-18 MED ORDER — CARBOXYMETHYLCELLUL-GLYCERIN 0.5-0.9 % OP SOLN: 1.0000 [drp] | Freq: Three times a day (TID) | OPHTHALMIC | Status: DC | PRN

## 2012-08-18 MED ORDER — NEOSTIGMINE METHYLSULFATE 1 MG/ML IJ SOLN
INTRAMUSCULAR | Status: DC | PRN
  Administered 2012-08-18: 4 mg via INTRAVENOUS

## 2012-08-18 MED ORDER — FENTANYL CITRATE 0.05 MG/ML IJ SOLN
25.0000 ug | INTRAMUSCULAR | Status: DC | PRN
  Administered 2012-08-18: 25 ug via INTRAVENOUS

## 2012-08-18 MED ORDER — ONDANSETRON HCL 4 MG/2ML IJ SOLN
INTRAMUSCULAR | Status: DC | PRN
  Administered 2012-08-18: 4 mg via INTRAVENOUS

## 2012-08-18 MED ORDER — BUPIVACAINE-EPINEPHRINE 0.5% -1:200000 IJ SOLN
INTRAMUSCULAR | Status: AC
  Filled 2012-08-18: qty 1

## 2012-08-18 MED ORDER — KETOROLAC TROMETHAMINE 30 MG/ML IJ SOLN: 15.0000 mg | Freq: Once | INTRAMUSCULAR | Status: DC | PRN

## 2012-08-18 MED ORDER — ACETAMINOPHEN 10 MG/ML IV SOLN
INTRAVENOUS | Status: AC
  Filled 2012-08-18: qty 100

## 2012-08-18 MED ORDER — ONDANSETRON HCL 4 MG PO TABS: 4.0000 mg | ORAL_TABLET | Freq: Four times a day (QID) | ORAL | Status: DC | PRN

## 2012-08-18 MED ORDER — NAPHAZOLINE-PHENIRAMINE 0.025-0.3 % OP SOLN
1.0000 [drp] | Freq: Four times a day (QID) | OPHTHALMIC | Status: DC | PRN
  Filled 2012-08-18: qty 15

## 2012-08-18 MED ORDER — ROCURONIUM BROMIDE 100 MG/10ML IV SOLN
INTRAVENOUS | Status: DC | PRN
  Administered 2012-08-18: 5 mg via INTRAVENOUS
  Administered 2012-08-18: 40 mg via INTRAVENOUS

## 2012-08-18 MED ORDER — BUPIVACAINE-EPINEPHRINE PF 0.5-1:200000 % IJ SOLN
INTRAMUSCULAR | Status: DC | PRN
  Administered 2012-08-18: 50 mL

## 2012-08-18 MED ORDER — ACETAMINOPHEN 10 MG/ML IV SOLN
INTRAVENOUS | Status: DC | PRN
  Administered 2012-08-18: 1000 mg via INTRAVENOUS

## 2012-08-18 MED ORDER — PROPOFOL 10 MG/ML IV BOLUS
INTRAVENOUS | Status: DC | PRN
  Administered 2012-08-18: 170 mg via INTRAVENOUS

## 2012-08-18 MED ORDER — PHENYLEPHRINE HCL 10 MG/ML IJ SOLN
INTRAMUSCULAR | Status: DC | PRN
  Administered 2012-08-18 (×2): 80 ug via INTRAVENOUS
  Administered 2012-08-18 (×2): 40 ug via INTRAVENOUS

## 2012-08-18 MED ORDER — BIOTENE DRY MOUTH MT LIQD
15.0000 mL | Freq: Two times a day (BID) | OROMUCOSAL | Status: DC
  Administered 2012-08-18: 15 mL via OROMUCOSAL

## 2012-08-18 MED ORDER — ONDANSETRON HCL 4 MG/2ML IJ SOLN: 4.0000 mg | Freq: Four times a day (QID) | INTRAMUSCULAR | Status: DC | PRN

## 2012-08-18 MED ORDER — SUCCINYLCHOLINE CHLORIDE 20 MG/ML IJ SOLN
INTRAMUSCULAR | Status: DC | PRN
  Administered 2012-08-18: 100 mg via INTRAVENOUS

## 2012-08-18 MED ORDER — FENTANYL CITRATE 0.05 MG/ML IJ SOLN
INTRAMUSCULAR | Status: DC | PRN
  Administered 2012-08-18: 25 ug via INTRAVENOUS
  Administered 2012-08-18: 50 ug via INTRAVENOUS

## 2012-08-18 MED ORDER — LACTATED RINGERS IV SOLN
INTRAVENOUS | Status: DC | PRN
  Administered 2012-08-18 (×2): via INTRAVENOUS

## 2012-08-18 MED ORDER — LIDOCAINE HCL (CARDIAC) 20 MG/ML IV SOLN
INTRAVENOUS | Status: DC | PRN
  Administered 2012-08-18: 100 mg via INTRAVENOUS

## 2012-08-18 MED ORDER — PROMETHAZINE HCL 25 MG/ML IJ SOLN: 6.2500 mg | INTRAMUSCULAR | Status: DC | PRN

## 2012-08-18 MED ORDER — LACTATED RINGERS IV SOLN
INTRAVENOUS | Status: DC | PRN
  Administered 2012-08-18: 1000 mL

## 2012-08-18 MED ORDER — KETOTIFEN FUMARATE 0.025 % OP SOLN: 1.0000 [drp] | Freq: Two times a day (BID) | OPHTHALMIC | Status: DC

## 2012-08-18 MED ORDER — 0.9 % SODIUM CHLORIDE (POUR BTL) OPTIME
TOPICAL | Status: DC | PRN
  Administered 2012-08-18: 1000 mL

## 2012-08-18 MED ORDER — DEXAMETHASONE SODIUM PHOSPHATE 10 MG/ML IJ SOLN
INTRAMUSCULAR | Status: DC | PRN
  Administered 2012-08-18: 10 mg via INTRAVENOUS

## 2012-08-18 MED ORDER — HEPARIN SODIUM (PORCINE) 5000 UNIT/ML IJ SOLN
5000.0000 [IU] | Freq: Three times a day (TID) | INTRAMUSCULAR | Status: DC
  Administered 2012-08-18 – 2012-08-20 (×5): 5000 [IU] via SUBCUTANEOUS
  Filled 2012-08-18 (×8): qty 1

## 2012-08-18 MED ORDER — TISSEEL VH 10 ML EX KIT
PACK | CUTANEOUS | Status: AC
  Filled 2012-08-18: qty 2

## 2012-08-18 MED ORDER — MIDAZOLAM HCL 5 MG/5ML IJ SOLN
INTRAMUSCULAR | Status: DC | PRN
  Administered 2012-08-18: 2 mg via INTRAVENOUS

## 2012-08-18 MED ORDER — KCL IN DEXTROSE-NACL 20-5-0.9 MEQ/L-%-% IV SOLN
INTRAVENOUS | Status: DC
  Administered 2012-08-18 – 2012-08-19 (×2): via INTRAVENOUS
  Administered 2012-08-19: 100 mL/h via INTRAVENOUS
  Filled 2012-08-18 (×6): qty 1000

## 2012-08-18 MED ORDER — SODIUM CHLORIDE 0.9 % IV SOLN
10.0000 mg | INTRAVENOUS | Status: DC | PRN
  Administered 2012-08-18: 20 ug/min via INTRAVENOUS

## 2012-08-18 MED ORDER — MORPHINE SULFATE 2 MG/ML IJ SOLN
2.0000 mg | INTRAMUSCULAR | Status: DC | PRN
  Administered 2012-08-18 – 2012-08-19 (×2): 2 mg via INTRAVENOUS
  Filled 2012-08-18 (×2): qty 1

## 2012-08-18 MED ORDER — TISSEEL VH 10 ML EX KIT
PACK | CUTANEOUS | Status: DC | PRN
  Administered 2012-08-18: 10 mL

## 2012-08-18 MED ORDER — GLYCOPYRROLATE 0.2 MG/ML IJ SOLN
INTRAMUSCULAR | Status: DC | PRN
  Administered 2012-08-18: 0.6 mg via INTRAVENOUS

## 2012-08-18 SURGICAL SUPPLY — 59 items
ADH SKN CLS APL DERMABOND .7 (GAUZE/BANDAGES/DRESSINGS) ×4
APL SKNCLS STERI-STRIP NONHPOA (GAUZE/BANDAGES/DRESSINGS)
APL SRG 32X5 SNPLK LF DISP (MISCELLANEOUS) ×2
APPLIER CLIP ROT 10 11.4 M/L (STAPLE)
APR CLP MED LRG 11.4X10 (STAPLE)
BENZOIN TINCTURE PRP APPL 2/3 (GAUZE/BANDAGES/DRESSINGS) IMPLANT
CANISTER SUCTION 2500CC (MISCELLANEOUS) ×2 IMPLANT
CANNULA ENDOPATH XCEL 11M (ENDOMECHANICALS) ×3 IMPLANT
CLAMP ENDO BABCK 10MM (STAPLE) IMPLANT
CLIP APPLIE ROT 10 11.4 M/L (STAPLE) ×1 IMPLANT
CLOTH BEACON ORANGE TIMEOUT ST (SAFETY) ×3 IMPLANT
DECANTER SPIKE VIAL GLASS SM (MISCELLANEOUS) ×3 IMPLANT
DERMABOND ADVANCED (GAUZE/BANDAGES/DRESSINGS) ×2
DERMABOND ADVANCED .7 DNX12 (GAUZE/BANDAGES/DRESSINGS) ×4 IMPLANT
DEVICE SUT QUICK LOAD TK 5 (STAPLE) ×26 IMPLANT
DEVICE SUT TI-KNOT TK 5X26 (MISCELLANEOUS) ×2 IMPLANT
DEVICE SUTURE ENDOST 10MM (ENDOMECHANICALS) ×3 IMPLANT
DEVICE TROCAR PUNCTURE CLOSURE (ENDOMECHANICALS) ×3 IMPLANT
DISSECTOR BLUNT TIP ENDO 5MM (MISCELLANEOUS) ×3 IMPLANT
DRAIN PENROSE 18X1/2 LTX STRL (DRAIN) ×3 IMPLANT
DRAPE LAPAROSCOPIC ABDOMINAL (DRAPES) ×3 IMPLANT
ELECT REM PT RETURN 9FT ADLT (ELECTROSURGICAL) ×3
ELECTRODE REM PT RTRN 9FT ADLT (ELECTROSURGICAL) ×2 IMPLANT
FELT TEFLON 4 X1 (Mesh General) ×1 IMPLANT
GLOVE BIOGEL PI IND STRL 7.0 (GLOVE) ×2 IMPLANT
GLOVE BIOGEL PI INDICATOR 7.0 (GLOVE) ×1
GOWN STRL NON-REIN LRG LVL3 (GOWN DISPOSABLE) ×1 IMPLANT
GOWN STRL REIN XL XLG (GOWN DISPOSABLE) ×24 IMPLANT
GRASPER ENDO BABCOCK 10 (MISCELLANEOUS) IMPLANT
GRASPER ENDO BABCOCK 10MM (MISCELLANEOUS)
KIT BASIN OR (CUSTOM PROCEDURE TRAY) ×3 IMPLANT
MESH SURGISIS HERNIA ×2 IMPLANT
NS IRRIG 1000ML POUR BTL (IV SOLUTION) ×3 IMPLANT
PENCIL BUTTON HOLSTER BLD 10FT (ELECTRODE) IMPLANT
SCALPEL HARMONIC ACE (MISCELLANEOUS) ×3 IMPLANT
SEALANT SURGICAL APPL DUAL CAN (MISCELLANEOUS) ×3 IMPLANT
SET IRRIG TUBING LAPAROSCOPIC (IRRIGATION / IRRIGATOR) ×3 IMPLANT
SLEEVE Z-THREAD 5X100MM (TROCAR) IMPLANT
SOLUTION ANTI FOG 6CC (MISCELLANEOUS) ×3 IMPLANT
STAPLER VISISTAT 35W (STAPLE) ×3 IMPLANT
STRIP CLOSURE SKIN 1/2X4 (GAUZE/BANDAGES/DRESSINGS) IMPLANT
SUT MNCRL AB 4-0 PS2 18 (SUTURE) ×3 IMPLANT
SUT SURGIDAC NAB ES-9 0 48 120 (SUTURE) ×30 IMPLANT
SUT VIC AB 2-0 SH 27 (SUTURE) ×9
SUT VIC AB 2-0 SH 27X BRD (SUTURE) ×6 IMPLANT
TIP INNERVISION DETACH 40FR (MISCELLANEOUS) IMPLANT
TIP INNERVISION DETACH 50FR (MISCELLANEOUS) IMPLANT
TIP INNERVISION DETACH 56FR (MISCELLANEOUS) IMPLANT
TIPS INNERVISION DETACH 40FR (MISCELLANEOUS)
TOWEL OR 17X26 10 PK STRL BLUE (TOWEL DISPOSABLE) ×3 IMPLANT
TOWEL OR NON WOVEN STRL DISP B (DISPOSABLE) ×1 IMPLANT
TRAY FOLEY CATH 14FRSI W/METER (CATHETERS) ×3 IMPLANT
TRAY LAP CHOLE (CUSTOM PROCEDURE TRAY) ×3 IMPLANT
TROCAR BLADELESS OPT 5 100 (ENDOMECHANICALS) ×6 IMPLANT
TROCAR XCEL NON-BLD 11X100MML (ENDOMECHANICALS) ×2 IMPLANT
TROCAR Z-THREAD FIOS 11X100 BL (TROCAR) IMPLANT
TROCAR Z-THREAD FIOS 5X100MM (TROCAR) ×1 IMPLANT
TROCAR Z-THREAD SLEEVE 11X100 (TROCAR) IMPLANT
TUBING INSUFFLATION 10FT LAP (TUBING) ×3 IMPLANT

## 2012-08-18 NOTE — Anesthesia Postprocedure Evaluation (Signed)
  Anesthesia Post-op Note  Patient: Rachel Kelly  Procedure(s) Performed: Procedure(s) (LRB): LAPAROSCOPIC REPAIR OF HIATAL HERNIA (N/A) INSERTION OF MESH ()  Patient Location: PACU  Anesthesia Type: General  Level of Consciousness: awake and alert   Airway and Oxygen Therapy: Patient Spontanous Breathing  Post-op Pain: mild  Post-op Assessment: Post-op Vital signs reviewed, Patient's Cardiovascular Status Stable, Respiratory Function Stable, Patent Airway and No signs of Nausea or vomiting  Last Vitals:  Filed Vitals:   08/18/12 1215  BP: 120/70  Pulse: 79  Temp:   Resp: 23    Post-op Vital Signs: stable   Complications: No apparent anesthesia complications

## 2012-08-18 NOTE — Anesthesia Preprocedure Evaluation (Addendum)
Anesthesia Evaluation  Patient identified by MRN, date of birth, ID band Patient awake    Reviewed: Allergy & Precautions, H&P , NPO status , Patient's Chart, lab work & pertinent test results  Airway Mallampati: III TM Distance: <3 FB Neck ROM: Full  Mouth opening: Limited Mouth Opening  Dental  (+) Caps and Dental Advisory Given   Pulmonary sleep apnea and Oxygen sleep apnea ,  Sarcoidosis  Pulm HTN breath sounds clear to auscultation  Pulmonary exam normal       Cardiovascular hypertension, Pt. on medications Rhythm:Regular Rate:Normal     Neuro/Psych negative neurological ROS  negative psych ROS   GI/Hepatic negative GI ROS, Neg liver ROS,   Endo/Other  negative endocrine ROS  Renal/GU negative Renal ROS  negative genitourinary   Musculoskeletal negative musculoskeletal ROS (+)   Abdominal   Peds negative pediatric ROS (+)  Hematology negative hematology ROS (+)   Anesthesia Other Findings   Reproductive/Obstetrics negative OB ROS                          Anesthesia Physical Anesthesia Plan  ASA: III  Anesthesia Plan: General   Post-op Pain Management:    Induction: Intravenous  Airway Management Planned: Oral ETT  Additional Equipment:   Intra-op Plan:   Post-operative Plan: Extubation in OR  Informed Consent: I have reviewed the patients History and Physical, chart, labs and discussed the procedure including the risks, benefits and alternatives for the proposed anesthesia with the patient or authorized representative who has indicated his/her understanding and acceptance.   Dental advisory given  Plan Discussed with: CRNA and Surgeon  Anesthesia Plan Comments:         Anesthesia Quick Evaluation

## 2012-08-18 NOTE — Progress Notes (Signed)
Daughter has CPAP mask and tubing

## 2012-08-18 NOTE — H&P (View-Only) (Signed)
Chief complaint: Hiatal hernia shortness of breath  History: Patient returns for a preop visit prior to planned laparoscopic repair of her large hiatal hernia.  The patient is a very pleasant 67-year-old female referred by Dr. Clinton Young for findings of a large hiatal hernia. The patient has a fairly long history of dyspnea which was recently reevaluated by Dr. Young. She has a history of sarcoidosis but this has been inactive. She had a CT angiogram of her chest as part of this evaluation. I have personally reviewed this study which shows a extremely large hiatal hernia containing the entire stomach, a significant part of the transverse colon and portions of the liver and pancreas. This causes some significant compression of the left lung as well as some displacement of the heart. She has had a thorough evaluation by Dr. Young out other definite cause of her shortness of breath and it is felt that the hernia is likely the main source of her symptoms. She has known she had a hiatal hernia from imaging that was done some years ago. She however denies any chest or abdominal pain, dysphagia or symptoms to suggest reflux. She does get some borborygmi in her upper abdomen and chest at night. She however is remarkably symptom-free in terms of GI problems. She gets moderate shortness of breath with exertion and has had in the past low O2 saturations requiring home oxygen but this is a little better recently. Her general health has below is otherwise reasonably good. She has had a GI evaluation in the past with colonoscopy and endoscopy a number of years ago but she cannot recall the exact indication.  She recently was started on CPAP at night by Dr. Young. I originally evaluated her in September and after consideration she has elected to proceed with surgery. She has been having some occasional back pain between her shoulder blades it feels like a muscle spasm but otherwise no chest or abdominal pain or GI symptoms  appear  Past Medical History  Diagnosis Date  . Hyperlipidemia   . Hypertension   . Sarcoidosis   . PAH (pulmonary artery hypertension)   . HH (hiatus hernia)   . Hernia   . Anemia   . Blood transfusion   . Wheezing   . Shortness of breath     on exertion  . Sleep apnea     on o2 at night   Past Surgical History  Procedure Date  . Back surgery 2005  . Vesicovaginal fistula closure w/ tah   . Abdominal hysterectomy 1988  . Esophagogastroduodenoscopy 06/24/2012    Procedure: ESOPHAGOGASTRODUODENOSCOPY (EGD);  Surgeon: David H Newman, MD;  Location: [facility];  Service: General;  Laterality: N/A;   Current Outpatient Prescriptions  Medication Sig Dispense Refill  . acetaminophen (TYLENOL) 500 MG tablet Take 1,000 mg by mouth every 6 (six) hours as needed. Pain      . Carboxymethylcellul-Glycerin (OPTIVE) 0.5-0.9 % SOLN Apply 1 drop to eye 2 (two) times daily as needed. Dry eyes      . Cholecalciferol (VITAMIN D) 2000 UNITS CAPS Take 1 capsule by mouth daily.      . Coenzyme Q10 200 MG TABS Take 200 mg by mouth daily.      . ketotifen (ALAWAY) 0.025 % ophthalmic solution Place 1 drop into both eyes 2 (two) times daily.      . Magnesium 500 MG TABS Take 500 mg by mouth daily.      . potassium gluconate 595 MG TABS   Take 595 mg by mouth daily.        . simvastatin (ZOCOR) 20 MG tablet Take 20 mg by mouth every morning.       . triamterene-hydrochlorothiazide (MAXZIDE-25) 37.5-25 MG per tablet Take 1 tablet by mouth every morning.       . urea (CARMOL) 40 % CREA Apply as directed       Allergies  Allergen Reactions  . Penicillins Rash    Fine red bumps that spread all over the body.   History  Substance Use Topics  . Smoking status: Never Smoker   . Smokeless tobacco: Never Used  . Alcohol Use: No  Exam: General: Alert, somewhat obese African American female, in no distress  BP 120/80  Pulse 64  Temp 97.9 F (36.6 C)  Resp 18  Ht 5' (1.524 m)  Wt 174 lb (78.926 kg)   BMI 33.98 kg/m2  Skin: Warm and dry without rash or infection.  HEENT: No palpable masses or thyromegaly. Sclera nonicteric. Pupils equal round and reactive. Oropharynx clear.  Lymph nodes: No cervical, supraclavicular, or inguinal nodes palpable.  Lungs: Breath sounds somewhat decreased in the left base without increased work of breathing  Cardiovascular: Regular rate and rhythm without murmur. No JVD or edema. Peripheral pulses intact.  Abdomen: Nondistended. Soft and nontender. No masses palpable. No organomegaly. No palpable hernias. Well-healed low midline incision  Extremities: No edema or joint swelling or deformity. No chronic venous stasis changes.  Neurologic: Alert and fully oriented. Gait normal    Assessment and plan: Massive hiatal hernia containing the entire stomach as well as portions of the colon and pancreas. She has significant dyspnea with exertion and a negative workup otherwise and the feeling Scher by Dr. Young is that her large hernia is contributing to her pulmonary issues. She has had time to think this over and would like to proceed with repair. We again discussed repair in detail including the indications and its nature and expected recovery and potential for improvement. Risks of anesthetic complications, bleeding, infection, visceral injury, recurrent hernia were all discussed and understood. All questions were answered. She scheduled for later this month. 

## 2012-08-18 NOTE — Transfer of Care (Signed)
Immediate Anesthesia Transfer of Care Note  Patient: Rachel Kelly  Procedure(s) Performed: Procedure(s) (LRB) with comments: LAPAROSCOPIC REPAIR OF HIATAL HERNIA (N/A) - Laparoscopic Hiatal Hernia Repair  INSERTION OF MESH ()  Patient Location: PACU  Anesthesia Type:General  Level of Consciousness: sedated  Airway & Oxygen Therapy: Patient Spontanous Breathing and Patient connected to face mask oxygen  Post-op Assessment: Report given to PACU RN and Post -op Vital signs reviewed and stable  Post vital signs: Reviewed and stable  Complications: No apparent anesthesia complications

## 2012-08-18 NOTE — Op Note (Signed)
Preoperative Diagnosis: hiatal hernia  Postoprative Diagnosis: hiatal hernia  Procedure: Procedure(s): LAPAROSCOPIC REPAIR OF HIATAL HERNIA INSERTION OF MESH   Surgeon: Glenna Fellows T   Assistants: Lodema Pilot  Anesthesia:  General endotracheal anesthesiaDiagnos  Indications:   Patient is a 67 year old female with a long history of pulmonary problems but recent worsening of significant dyspnea on exertion. She has had a thorough workup by her pulmonologist with findings on CT scan of a very large hiatal hernia containing the entire stomach, portion of the transverse colon and part of the duodenum and pancreas. After extensive workup it is felt that displacement of her pulmonary volume by her hiatal hernia is the main contributing factor to her worsening shortness of breath. She has no significant GI symptoms or reflux. After extensive discussion and consultation with her pulmonary physician and after discussion of potential benefits and risks detailed extensively elsewhere we have elected to proceed with laparoscopic repair of her giant hiatal hernia.  Procedure Detail:  Patient is brought to the operating room, placed in supine position on the operating table, general endotracheal anesthesia induced she received preoperative IV antibiotics. PAS were in place. The abdomen was widely sterilely prepped and draped. Patient timeout was performed the correct procedure verified. Access was obtained without difficulty with a 5 mm Optiview trocar in the left upper quadrant midclavicular line. A second 5 mm trocar was placed laterally in the left abdomen and some moderate omental adhesions up around the midline were taken down with Harmonic scalpel until we were well below the umbilicus. Under direct vision a 5 mm trocar was placed laterally in the right upper quadrant, an 11 mm trocar in the mid right upper quadrant an 11 mm trocar just above it the left of the umbilicus for the camera port. A 5  mm subxiphoid site the Nathanson retractor was placed and the left lobe of the liver elevated with excellent exposure of the upper abdomen hiatus. There was a very large hiatal hernia immediately apparent. The entire stomach and omentum and portion of the transverse colon where the hernia and these were reduced as much as possible with gentle traction. There were still significant adhesions of the upper stomach and esophagus applied to the hernia. The pars flaccida was divided along an avascular plane and beginning along the lateral right crus the hernia sac was incised and this incision was carried up around the anterior crural defect and then down along the left crus. The hernia sac was then bluntly extensively dissected down out of the mediastinum also using some harmonic energy. The esophagus was easily visible it was carefully protected. The entire hernia sac anteriorly was completely stripped down below the hiatus which significantly mobilize the stomach and the EG junction toward the hiatus. We then dissected the hernia sac posteriorly on the left and right crus further bringing the EG junction inferiorly and further dissected the posterior hernia sac. We were able to dissect all the way down to the origin occur posteriorly and then a Penrose drain was passed around the EG junction elevating this and the entire hiatus was completely freed and the hernia sac completely dissected out of the mediastinum. At this point the esophagus was further mobilized carefully dividing areolar attachments well up into the mediastinum and we got 3 good esophageal length toward the EG junction lay with no tension placed a couple centimeters below the diaphragm. The hernia sac was then excised with harmonic scalpel and removed. A posterior crural repair was started with interrupted  0 Ethibond pledgeted sutures. There was mild to moderate tension as I worked anteriorly. Due to the very large size the defect I also placed 2  pledgeted 0 Ethibond sutures anteriorly as well. About 5 or 6 posterior sutures were placed and this closed the hiatus down to a normal size around the esophagus estimated about 56 bougie size although we were confident in the closure an the size and did not pass a dilator. Following the crural repair a SurgAssist hiatal soft tissue patch was moistened introduced and placed over the repair anterior and posteriorly with the tails on the left lateral side. The corners were sutured up to the diaphragm with interrupted 3-0 Vicryl and then in addition Tisseel tissue sealant was used to further fix the soft tissue patch up to the diaphragm. I did not do a fundoplication that the patient had no preoperative reflux. However we did pex the stomach up to the diaphragm suturing along the greater curve to the left diaphragm with interrupted 0 Ethibond sutures. This was carried out and inferiorly down to the anterior abdominal wall and one suture was placed from the greater curve transabdominally using the Endo Close for fixation to the fascia. The abdomen was then inspected for hemostasis and there was no bleeding or evidence of visceral injury or other problems. The Nathanson retractor was removed under direct vision. All CO2 was evacuated and trochars removed. Skin incisions were closed with subcuticular Monocryl and Dermabond. Sponge needle and instrument counts were correct.   Estimated Blood Loss:  Minimal         Drains: None  Blood Given: none          Specimens: None        Complications:  * No complications entered in OR log *         Disposition: PACU - hemodynamically stable.         Condition: stable

## 2012-08-18 NOTE — Interval H&P Note (Signed)
History and Physical Interval Note:  08/18/2012 8:36 AM  Rachel Kelly  has presented today for surgery, with the diagnosis of hiatal hernia  The various methods of treatment have been discussed with the patient and family. After consideration of risks, benefits and other options for treatment, the patient has consented to  Procedure(s) (LRB) with comments: LAPAROSCOPIC REPAIR OF HIATAL HERNIA (N/A) - Laparoscopic Hiatal Hernia Repair possible Open as a surgical intervention .  The patient's history has been reviewed, patient examined, no change in status, stable for surgery.  I have reviewed the patient's chart and labs.  Questions were answered to the patient's satisfaction.     Avea Mcgowen T

## 2012-08-18 NOTE — Progress Notes (Signed)
Fentanyl 25 mcg given IV for c/o pain.

## 2012-08-19 LAB — CBC
HCT: 33.5 % — ABNORMAL LOW (ref 36.0–46.0)
MCH: 29.9 pg (ref 26.0–34.0)
MCHC: 33.4 g/dL (ref 30.0–36.0)
MCV: 89.3 fL (ref 78.0–100.0)
Platelets: 167 10*3/uL (ref 150–400)
RDW: 12.9 % (ref 11.5–15.5)
WBC: 9.3 10*3/uL (ref 4.0–10.5)

## 2012-08-19 LAB — BASIC METABOLIC PANEL
BUN: 14 mg/dL (ref 6–23)
Calcium: 8.7 mg/dL (ref 8.4–10.5)
Chloride: 107 mEq/L (ref 96–112)
Creatinine, Ser: 0.99 mg/dL (ref 0.50–1.10)
GFR calc Af Amer: 67 mL/min — ABNORMAL LOW (ref >90)

## 2012-08-19 NOTE — Progress Notes (Signed)
Patient ID: Rachel Kelly, female   DOB: [birth year], 67 y.o.   MRN: [id number] 1 Day Post-Op  Subjective: No C/O.  Denies pain, just mildly sore.  No SOB or nausea  Objective: Vital signs in last 24 hours: Temp:  [97.6 F (36.4 C)-99.5 F (37.5 C)] 98.2 F (36.8 C) (01/30 0552) Pulse Rate:  [70-103] 74  (01/30 0552) Resp:  [18-24] 18  (01/30 0552) BP: (109-135)/(61-82) 123/72 mmHg (01/30 0552) SpO2:  [92 %-100 %] 100 % (01/30 0552) Weight:  [174 lb (78.926 kg)] 174 lb (78.926 kg) (01/29 1624) Last BM Date: 08/16/12  Intake/Output from previous day: 01/29 0701 - 01/30 0700 In: 2731.7 [I.V.:2731.7] Out: 2220 [Urine:2200; Blood:20] Intake/Output this shift: Total I/O In: 0  Out: 2100 [Urine:2100]  General appearance: alert, cooperative and no distress Lungs: Clear without increased WOB GI: normal findings: soft, non-tender Incision/Wound: Clean and dry  Lab Results:   [facility] 08/19/12 0459  WBC 9.3  HGB 11.2*  HCT 33.5*  PLT 167   BMET  Basename 08/19/12 0459  NA 138  K 4.3  CL 107  CO2 25  GLUCOSE 163*  BUN 14  CREATININE 0.99  CALCIUM 8.7     Studies/Results: No results found.  Anti-infectives: Anti-infectives     Start     Dose/Rate Route Frequency Ordered Stop   08/18/12 0600   ciprofloxacin (CIPRO) IVPB 400 mg        400 mg 200 mL/hr over 60 Minutes Intravenous On call to O.R. 08/17/12 1725 08/18/12 0843          Assessment/Plan: s/p Procedure(s): LAPAROSCOPIC REPAIR OF HIATAL HERNIA INSERTION OF MESH Doing very well.  Will start CL diet.  Increase activity   LOS: 1 day    Napoleon Monacelli T 08/19/2012

## 2012-08-19 NOTE — Care Management Note (Signed)
    Page 1 of 1   08/19/2012     12:53:10 PM   CARE MANAGEMENT NOTE 08/19/2012  Patient:  Rachel Kelly, Rachel Kelly   Account Number:  [id number]  Date Initiated:  08/19/2012  Documentation initiated by:  Lorenda Ishihara  Subjective/Objective Assessment:   67 yo female admitted s/p nissen fundoplication. PTA lived at home with family     Action/Plan:   home when stable   Anticipated DC Date:  08/23/2012   Anticipated DC Plan:  HOME/SELF CARE      DC Planning Services  CM consult      Choice offered to / List presented to:             Status of service:  Completed, signed off Medicare Important Message given?   (If response is "NO", the following Medicare IM given date fields will be blank) Date Medicare IM given:   Date Additional Medicare IM given:    Discharge Disposition:  HOME/SELF CARE  Per UR Regulation:  Reviewed for med. necessity/level of care/duration of stay  If discussed at Long Length of Stay Meetings, dates discussed:    Comments:

## 2012-08-20 LAB — BASIC METABOLIC PANEL WITH GFR
BUN: 10 mg/dL (ref 6–23)
CO2: 26 meq/L (ref 19–32)
Calcium: 8.2 mg/dL — ABNORMAL LOW (ref 8.4–10.5)
Chloride: 110 meq/L (ref 96–112)
Creatinine, Ser: 0.94 mg/dL (ref 0.50–1.10)
GFR calc Af Amer: 71 mL/min — ABNORMAL LOW
GFR calc non Af Amer: 61 mL/min — ABNORMAL LOW
Glucose, Bld: 102 mg/dL — ABNORMAL HIGH (ref 70–99)
Potassium: 4.2 meq/L (ref 3.5–5.1)
Sodium: 140 meq/L (ref 135–145)

## 2012-08-20 LAB — CBC
Hemoglobin: 10.6 g/dL — ABNORMAL LOW (ref 12.0–15.0)
MCH: 29.6 pg (ref 26.0–34.0)
Platelets: 148 10*3/uL — ABNORMAL LOW (ref 150–400)
RBC: 3.58 MIL/uL — ABNORMAL LOW (ref 3.87–5.11)

## 2012-08-20 MED ORDER — OXYCODONE-ACETAMINOPHEN 5-325 MG/5ML PO SOLN: 5.0000 mL | ORAL | Status: DC | PRN

## 2012-08-20 NOTE — Progress Notes (Signed)
Pt D/C home. Pt is alert and oriented no new complains, pt denies pain on room air, vitals within normal range for pt. D/C instructions done, medication administration done, pt verbalizes understanding.

## 2012-08-20 NOTE — Discharge Summary (Signed)
   Patient ID: Rachel Kelly [id number] 66 y.o. 1945-12-28  08/18/2012  Discharge date and time: 08/20/2012   Admitting Physician: Glenna Fellows T  Discharge Physician: Glenna Fellows T  Admission Diagnoses: hiatal hernia  Discharge Diagnoses: same  Operations: Procedure(s): LAPAROSCOPIC REPAIR OF HIATAL HERNIA INSERTION OF MESH  Admission Condition: good  Discharged Condition: good  Indication for Admission: patient is a 67 year old female with pulmonary problems and worsening dyspnea on exertion. Workup by her pulmonary physician has uncovered a very large hiatal hernia with her entire stomach and portion of the colon and pancreas within the chest. After extensive workup and consultation detailed elsewhere this is felt to be likely contributing to her pulmonary issues and repair has been recommended and accepted. She is electively admitted for this procedure.  Hospital Course: patient underwent laparoscopic repair of a very large hiatal hernia on the morning of admission. The procedure went smoothly. Her postoperative course was very uneventful. On the first postoperative day she had minimal discomfort and no nausea. Vital signs were stable. CBC was unremarkable. She was started on a clear liquid diet. By the second postoperative day she continued to feel well. Repeat CBC was unremarkable. Tolerating liquid diet. Abdomen is soft and nontender. Wounds healing well. Vital signs all within normal limits. She is felt ready for discharge.   Disposition: Home  Patient Instructions:   Rachel Kelly, Rachel Kelly  Home Medication Instructions [id number]   Printed on:08/20/12 9562  Medication Information                    triamterene-hydrochlorothiazide (MAXZIDE-25) 37.5-25 MG per tablet Take 1 tablet by mouth every morning.            potassium gluconate 595 MG TABS Take 595 mg by mouth daily.            simvastatin (ZOCOR) 20 MG tablet Take 20 mg by mouth every morning.      Coenzyme Q10 200 MG TABS Take 200 mg by mouth daily.           Magnesium 500 MG TABS Take 500 mg by mouth daily.           acetaminophen (TYLENOL) 500 MG tablet Take 1,000 mg by mouth every 6 (six) hours as needed. Pain           ketotifen (ALAWAY) 0.025 % ophthalmic solution Place 1 drop into both eyes 2 (two) times daily.           Carboxymethylcellul-Glycerin (OPTIVE) 0.5-0.9 % SOLN Apply 1 drop to eye 3 (three) times daily as needed. Dry eyes           Cholecalciferol (VITAMIN D) 2000 UNITS CAPS Take 1 capsule by mouth daily.           NON FORMULARY Oxygen and cpap at night           oxyCODONE-acetaminophen (ROXICET) 5-325 MG/5ML solution Take 5 mLs by mouth every 4 (four) hours as needed for pain.             Activity: activity as tolerated Diet: full liquid diet only for 2 weeks Wound Care: none needed  Follow-up:  With Dr. Johna Sheriff in 2 weeks.  Signed: Mariella Saa MD, FACS  08/20/2012, 8:33 AM

## 2012-08-25 ENCOUNTER — Encounter: Payer: Self-pay | Admitting: Internal Medicine

## 2012-08-26 ENCOUNTER — Encounter (INDEPENDENT_AMBULATORY_CARE_PROVIDER_SITE_OTHER): Payer: Self-pay | Admitting: Surgery

## 2012-08-26 ENCOUNTER — Ambulatory Visit (INDEPENDENT_AMBULATORY_CARE_PROVIDER_SITE_OTHER): Payer: Medicare Other | Admitting: Surgery

## 2012-08-26 VITALS — Wt 169.2 lb

## 2012-08-26 DIAGNOSIS — Z9889 Other specified postprocedural states: Secondary | ICD-10-CM

## 2012-08-26 NOTE — Patient Instructions (Signed)
benedryl OTC   25 mg by mouth every 6 hours as needed for itching.  If no better in next couple of days,  Call us back.  Remove in shower.

## 2012-08-26 NOTE — Progress Notes (Signed)
Patient returns after laparoscopic repair of large paraesophageal hernia by Dr. Johna Sheriff 8 days ago. She developed redness and itching rather port sites. No fever chills. No significant pain noted.  Exam: Port sites examined. All have a rim of erythema around them which corresponds to the Dermabond. They're nontender. They're not fluctuant. They itch.  Impression: Allergic skin reaction to Dermabond  Plan: Recommend Benadryl 25 mg every 6 hours as needed for itching. At next remove shower  Dermabond. If no better the next 2-3 days to call back.

## 2012-09-03 ENCOUNTER — Encounter (INDEPENDENT_AMBULATORY_CARE_PROVIDER_SITE_OTHER): Payer: Medicare Other | Admitting: General Surgery

## 2012-09-09 ENCOUNTER — Ambulatory Visit (INDEPENDENT_AMBULATORY_CARE_PROVIDER_SITE_OTHER): Payer: Medicare Other | Admitting: General Surgery

## 2012-09-09 VITALS — BP 108/72 | HR 66 | Temp 97.1°F | Resp 16 | Ht 59.0 in | Wt 169.0 lb

## 2012-09-09 DIAGNOSIS — Z9889 Other specified postprocedural states: Secondary | ICD-10-CM

## 2012-09-09 NOTE — Progress Notes (Signed)
History: Patient returns now 3 weeks following laparoscopic repair of her giant hiatal hernia. She had some allergic reaction to the Dermabond and one of my partners removed that and this is resolved. She notices a couple of issues. One is a lot of gurgling in her upper abdomen. She also notices some reflux and regurgitation at night. She is swallowing without difficulty and back on solid foods with no dysphagia. Energy level is returning. No significant abdominal pain.  Exam: BP 108/72  Pulse 66  Temp(Src) 97.1 F (36.2 C) (Temporal)  Resp 16  Ht 4\' 11"  (1.499 m)  Wt 169 lb (76.658 kg)  BMI 34.12 kg/m2 General: Appears well Abdomen: Soft and nontender. Wounds are well healed.  Assessment plan: Status post arthroscopic repair of giant hiatal hernia. No complications identified. I think she has some expected delayed gastric emptying postoperatively resulting in borborygmi and contributing to her nighttime reflux. This should improve over time. In the meantime I asked her not to eat or drink for 3 hours prior to going to bed and we will temporarily put her on Prilosec. Return in 6 weeks.

## 2012-09-09 NOTE — Patient Instructions (Signed)
Get Prilosec OTC and take according to package directions Avoid eating or drinking anything for 3 hours prior to going to bed at night

## 2012-09-30 ENCOUNTER — Ambulatory Visit: Payer: Medicare Other | Admitting: Internal Medicine

## 2012-10-26 ENCOUNTER — Ambulatory Visit (INDEPENDENT_AMBULATORY_CARE_PROVIDER_SITE_OTHER): Payer: Medicare Other | Admitting: General Surgery

## 2012-10-26 ENCOUNTER — Encounter (INDEPENDENT_AMBULATORY_CARE_PROVIDER_SITE_OTHER): Payer: Self-pay | Admitting: General Surgery

## 2012-10-26 VITALS — BP 136/80 | HR 80 | Temp 98.0°F | Resp 18 | Ht 62.0 in | Wt 176.8 lb

## 2012-10-26 DIAGNOSIS — Z9889 Other specified postprocedural states: Secondary | ICD-10-CM

## 2012-10-26 NOTE — Progress Notes (Signed)
Chief complaint: Followup laparoscopic repair of giant hiatal hernia  History: Patient returns for more long-term followup after laparoscopic repair of her giant hiatal hernia. She did not have GI symptoms preoperatively but had progressive shortness of breath felt to be secondary to lung displacement from her large hiatal hernia. She reports that she is feeling extremely well. Specifically she notices a marked improvement in her breathing. She states she was at a conference recently and was walking several blocks and going up stairs with coworkers and certainly realize that she would've been very short of breath previously but she was not. She is very pleased with this. At her last visit she was having a lot of borborygmi which has essentially resolved. She is still however having some occasional nighttime regurgitation.  Exam: BP 136/80  Pulse 80  Temp(Src) 98 F (36.7 C)  Resp 18  Ht 5\' 2"  (1.575 m)  Wt 176 lb 12.8 oz (80.196 kg)  BMI 32.33 kg/m2 General: Appears well Abdomen: Soft and nontender Lungs: Clear equal breath sounds  Assessment and plan: She happily has had significant improvement in her breathing as we had hoped. She does have some nighttime regurgitation that is a new symptom. I still think she may have some delayed gastric emptying as a result of the surgery which should continue to improve. I instructed her not to eat or drink for at least 2 hours prior to going to bed. I would like to see her back in 6 months for long-term followup.

## 2012-11-21 ENCOUNTER — Emergency Department (HOSPITAL_COMMUNITY): Payer: Medicare Other

## 2012-11-21 ENCOUNTER — Observation Stay (HOSPITAL_COMMUNITY)
Admission: EM | Admit: 2012-11-21 | Discharge: 2012-11-23 | Disposition: A | Discharge status: Home / Self Care | Payer: Medicare Other | Attending: Family Medicine | Admitting: Family Medicine

## 2012-11-21 ENCOUNTER — Encounter (HOSPITAL_COMMUNITY): Payer: Self-pay | Admitting: Family Medicine

## 2012-11-21 DIAGNOSIS — R0602 Shortness of breath: Secondary | ICD-10-CM | POA: Diagnosis present

## 2012-11-21 DIAGNOSIS — E785 Hyperlipidemia, unspecified: Secondary | ICD-10-CM | POA: Insufficient documentation

## 2012-11-21 DIAGNOSIS — Z79899 Other long term (current) drug therapy: Secondary | ICD-10-CM | POA: Insufficient documentation

## 2012-11-21 DIAGNOSIS — R197 Diarrhea, unspecified: Secondary | ICD-10-CM | POA: Insufficient documentation

## 2012-11-21 DIAGNOSIS — R109 Unspecified abdominal pain: Secondary | ICD-10-CM

## 2012-11-21 DIAGNOSIS — I10 Essential (primary) hypertension: Secondary | ICD-10-CM | POA: Diagnosis present

## 2012-11-21 DIAGNOSIS — R112 Nausea with vomiting, unspecified: Principal | ICD-10-CM | POA: Insufficient documentation

## 2012-11-21 DIAGNOSIS — R1110 Vomiting, unspecified: Secondary | ICD-10-CM

## 2012-11-21 DIAGNOSIS — G4733 Obstructive sleep apnea (adult) (pediatric): Secondary | ICD-10-CM | POA: Diagnosis present

## 2012-11-21 DIAGNOSIS — R0989 Other specified symptoms and signs involving the circulatory and respiratory systems: Secondary | ICD-10-CM | POA: Insufficient documentation

## 2012-11-21 DIAGNOSIS — N289 Disorder of kidney and ureter, unspecified: Secondary | ICD-10-CM | POA: Insufficient documentation

## 2012-11-21 DIAGNOSIS — D869 Sarcoidosis, unspecified: Secondary | ICD-10-CM | POA: Diagnosis present

## 2012-11-21 DIAGNOSIS — R0789 Other chest pain: Secondary | ICD-10-CM

## 2012-11-21 DIAGNOSIS — R0609 Other forms of dyspnea: Secondary | ICD-10-CM | POA: Insufficient documentation

## 2012-11-21 DIAGNOSIS — M546 Pain in thoracic spine: Secondary | ICD-10-CM | POA: Diagnosis present

## 2012-11-21 DIAGNOSIS — R1084 Generalized abdominal pain: Secondary | ICD-10-CM | POA: Insufficient documentation

## 2012-11-21 DIAGNOSIS — K219 Gastro-esophageal reflux disease without esophagitis: Secondary | ICD-10-CM | POA: Diagnosis present

## 2012-11-21 DIAGNOSIS — N2889 Other specified disorders of kidney and ureter: Secondary | ICD-10-CM

## 2012-11-21 LAB — POCT I-STAT TROPONIN I: Troponin i, poc: 0 ng/mL (ref 0.00–0.08)

## 2012-11-21 LAB — BASIC METABOLIC PANEL
CO2: 26 mEq/L (ref 19–32)
Chloride: 108 mEq/L (ref 96–112)
Glucose, Bld: 137 mg/dL — ABNORMAL HIGH (ref 70–99)
Sodium: 142 mEq/L (ref 135–145)

## 2012-11-21 LAB — CBC
Hemoglobin: 12 g/dL (ref 12.0–15.0)
MCV: 87.6 fL (ref 78.0–100.0)
Platelets: 184 10*3/uL (ref 150–400)
Platelets: 188 10*3/uL (ref 150–400)
RBC: 4.03 MIL/uL (ref 3.87–5.11)
RDW: 13.7 % (ref 11.5–15.5)
WBC: 8 10*3/uL (ref 4.0–10.5)
WBC: 7.7 10*3/uL (ref 4.0–10.5)

## 2012-11-21 LAB — URINALYSIS, ROUTINE W REFLEX MICROSCOPIC
Bilirubin Urine: NEGATIVE
Glucose, UA: NEGATIVE mg/dL
Ketones, ur: NEGATIVE mg/dL
Leukocytes, UA: NEGATIVE
Nitrite: NEGATIVE
Protein, ur: NEGATIVE mg/dL
Specific Gravity, Urine: 1.025 (ref 1.005–1.030)
Urobilinogen, UA: 1 mg/dL (ref 0.0–1.0)
pH: 5 (ref 5.0–8.0)

## 2012-11-21 LAB — HEPATIC FUNCTION PANEL
ALT: 28 U/L (ref 0–35)
AST: 46 U/L — ABNORMAL HIGH (ref 0–37)
Albumin: 3.5 g/dL (ref 3.5–5.2)
Alkaline Phosphatase: 77 U/L (ref 39–117)
Bilirubin, Direct: 0.2 mg/dL (ref 0.0–0.3)
Indirect Bilirubin: 0.4 mg/dL (ref 0.3–0.9)
Total Bilirubin: 0.6 mg/dL (ref 0.3–1.2)
Total Protein: 7.4 g/dL (ref 6.0–8.3)

## 2012-11-21 LAB — URINE MICROSCOPIC-ADD ON

## 2012-11-21 LAB — TROPONIN I: Troponin I: <0.3 ng/mL (ref <0.30)

## 2012-11-21 LAB — LIPASE, BLOOD: Lipase: 52 U/L (ref 11–59)

## 2012-11-21 MED ORDER — ONDANSETRON HCL 4 MG/2ML IJ SOLN
4.0000 mg | Freq: Once | INTRAMUSCULAR | Status: AC
  Administered 2012-11-21: 4 mg via INTRAVENOUS

## 2012-11-21 MED ORDER — CARBOXYMETHYLCELLUL-GLYCERIN 0.5-0.9 % OP SOLN: 1.0000 [drp] | Freq: Three times a day (TID) | OPHTHALMIC | Status: DC | PRN

## 2012-11-21 MED ORDER — ONDANSETRON HCL 4 MG/2ML IJ SOLN
4.0000 mg | Freq: Once | INTRAMUSCULAR | Status: AC
  Administered 2012-11-21: 4 mg via INTRAVENOUS
  Filled 2012-11-21: qty 2

## 2012-11-21 MED ORDER — IOHEXOL 300 MG/ML  SOLN
50.0000 mL | Freq: Once | INTRAMUSCULAR | Status: AC | PRN
  Administered 2012-11-21: 50 mL via ORAL

## 2012-11-21 MED ORDER — POLYVINYL ALCOHOL 1.4 % OP SOLN
1.0000 [drp] | Freq: Three times a day (TID) | OPHTHALMIC | Status: DC | PRN
  Filled 2012-11-21: qty 15

## 2012-11-21 MED ORDER — PANTOPRAZOLE SODIUM 40 MG IV SOLR
40.0000 mg | Freq: Two times a day (BID) | INTRAVENOUS | Status: DC
  Administered 2012-11-21 – 2012-11-23 (×4): 40 mg via INTRAVENOUS
  Filled 2012-11-21 (×5): qty 40

## 2012-11-21 MED ORDER — OLOPATADINE HCL 0.1 % OP SOLN
1.0000 [drp] | Freq: Two times a day (BID) | OPHTHALMIC | Status: DC
  Administered 2012-11-21 – 2012-11-23 (×4): 1 [drp] via OPHTHALMIC
  Filled 2012-11-21: qty 5

## 2012-11-21 MED ORDER — SODIUM CHLORIDE 0.9 % IV SOLN
INTRAVENOUS | Status: DC
  Administered 2012-11-21: 20:00:00 via INTRAVENOUS

## 2012-11-21 MED ORDER — IOHEXOL 300 MG/ML  SOLN
100.0000 mL | Freq: Once | INTRAMUSCULAR | Status: AC | PRN
  Administered 2012-11-21: 100 mL via INTRAVENOUS

## 2012-11-21 MED ORDER — KETOTIFEN FUMARATE 0.025 % OP SOLN: 1.0000 [drp] | Freq: Two times a day (BID) | OPHTHALMIC | Status: DC

## 2012-11-21 MED ORDER — ONDANSETRON HCL 4 MG/2ML IJ SOLN
INTRAMUSCULAR | Status: AC
  Filled 2012-11-21: qty 2

## 2012-11-21 MED ORDER — PROMETHAZINE HCL 12.5 MG PO TABS
12.5000 mg | ORAL_TABLET | Freq: Four times a day (QID) | ORAL | Status: DC | PRN
  Administered 2012-11-21 – 2012-11-23 (×3): 12.5 mg via ORAL
  Filled 2012-11-21 (×3): qty 1

## 2012-11-21 MED ORDER — SODIUM CHLORIDE 0.9 % IJ SOLN
3.0000 mL | Freq: Two times a day (BID) | INTRAMUSCULAR | Status: DC
  Administered 2012-11-22 – 2012-11-23 (×2): 3 mL via INTRAVENOUS

## 2012-11-21 NOTE — ED Notes (Signed)
Per pt sts tightness between shoulder blades that started this am with nausea and vomiting. Denies chest pain or abdominal pain. sts the pain feels like a tightness and is constant.

## 2012-11-21 NOTE — ED Notes (Signed)
Returned from [facility] heaves, vomiting small amount greenish liquid.

## 2012-11-21 NOTE — ED Provider Notes (Signed)
History     CSN: [id number]  Arrival date & time 11/21/12  1031   First MD Initiated Contact with Patient 11/21/12 1043      Chief Complaint  Patient presents with  . pain between shoulder blades   . Nausea    (Consider location/radiation/quality/duration/timing/severity/associated sxs/prior treatment) HPI Patient presents to the emergency department with nausea, and pressure in her mid back.  Patient denies chest pain, shortness of breath, fever, dizziness, syncope, weakness, blurred vision, and diarrhea, headache, neck pain, or dysuria.  Patient, states, that she did not take anything prior to arrival for her symptoms.  Patient, states, that she's had some blood mixed into her vomit.  Patient, states the symptoms started yesterday.  Patient denies any alleviating factors.  Patient, states, that palpation of her abdomen makes her nausea and discomfort worse. Past Medical History  Diagnosis Date  . Hyperlipidemia   . Hypertension   . Sarcoidosis   . PAH (pulmonary artery hypertension)   . Hernia   . Anemia   . Blood transfusion 2005  . Wheezing   . Shortness of breath     on exertion  . Sleep apnea     on o2 at night  . HH (hiatus hernia)     Past Surgical History  Procedure Laterality Date  . Vesicovaginal fistula closure w/ tah    . Esophagogastroduodenoscopy  06/24/2012    Procedure: ESOPHAGOGASTRODUODENOSCOPY (EGD);  Surgeon: Kandis Cocking, MD;  Location: [facility];  Service: General;  Laterality: N/A;  . Back surgery  2005    lower  . Abdominal hysterectomy  1988  . Hiatal hernia repair  08/18/2012    Procedure: LAPAROSCOPIC REPAIR OF HIATAL HERNIA;  Surgeon: Mariella Saa, MD;  Location: [facility];  Service: General;  Laterality: N/A;  Laparoscopic Hiatal Hernia Repair   . Insertion of mesh  08/18/2012    Procedure: INSERTION OF MESH;  Surgeon: Mariella Saa, MD;  Location: [facility];  Service: General;;    Family History  Problem Relation Age of Onset   . Coronary artery disease    . Cancer      ? type  . Clotting disorder Mother     PE  . Heart disease Father   . Cancer Maternal Grandmother     not sure type, but spread to the brain.    History  Substance Use Topics  . Smoking status: Never Smoker   . Smokeless tobacco: Never Used  . Alcohol Use: No    OB History   Grav Para Term Preterm Abortions TAB SAB Ect Mult Living                  Review of Systems All other systems negative except as documented in the HPI. All pertinent positives and negatives as reviewed in the HPI.  Allergies  Penicillins and Skin adhesives  Home Medications   Current Outpatient Rx  Name  Route  Sig  Dispense  Refill  . acetaminophen (TYLENOL) 500 MG tablet   Oral   Take 1,000 mg by mouth every 6 (six) hours as needed. Pain         . Carboxymethylcellul-Glycerin (OPTIVE) 0.5-0.9 % SOLN   Ophthalmic   Apply 1 drop to eye 3 (three) times daily as needed. Dry eyes         . Cholecalciferol (VITAMIN D) 2000 UNITS CAPS   Oral   Take 1 capsule by mouth daily.         Marland Kitchen  Coenzyme Q10 200 MG TABS   Oral   Take 200 mg by mouth daily.         Marland Kitchen ketotifen (ALAWAY) 0.025 % ophthalmic solution   Both Eyes   Place 1 drop into both eyes 2 (two) times daily.         . Magnesium 500 MG TABS   Oral   Take 500 mg by mouth daily.         . NON FORMULARY      Oxygen and cpap at night         . potassium gluconate 595 MG TABS   Oral   Take 595 mg by mouth daily.          . simvastatin (ZOCOR) 20 MG tablet   Oral   Take 20 mg by mouth every morning.          . triamterene-hydrochlorothiazide (MAXZIDE-25) 37.5-25 MG per tablet   Oral   Take 1 tablet by mouth every morning.          . urea (CARMOL) 40 % CREA      as needed.            BP 124/69  Pulse 69  Temp(Src) 97.7 F (36.5 C) (Oral)  Resp 24  SpO2 94%  Physical Exam  Nursing note and vitals reviewed. Constitutional: She is oriented to person,  place, and time. She appears well-developed and well-nourished. No distress.  HENT:  Head: Normocephalic and atraumatic.  Mouth/Throat: Oropharynx is clear and moist.  Eyes: Pupils are equal, round, and reactive to light.  Neck: Normal range of motion. Neck supple. No tracheal deviation present.  Cardiovascular: Normal rate and regular rhythm.  Exam reveals no gallop and no friction rub.   No murmur heard. Pulmonary/Chest: Effort normal and breath sounds normal. No respiratory distress. She has no wheezes. She exhibits no tenderness.  Abdominal: Soft. Bowel sounds are normal. She exhibits no distension and no mass. There is tenderness. There is no rebound and no guarding.  Neurological: She is alert and oriented to person, place, and time. She exhibits normal muscle tone. Coordination normal.  Skin: Skin is warm and dry. No rash noted.    ED Course  Procedures (including critical care time)  Labs Reviewed  CBC - Abnormal; Notable for the following:    HCT 35.3 (*)    All other components within normal limits  BASIC METABOLIC PANEL - Abnormal; Notable for the following:    Potassium 3.4 (*)    Glucose, Bld 137 (*)    GFR calc non Af Amer 51 (*)    GFR calc Af Amer 59 (*)    All other components within normal limits  HEPATIC FUNCTION PANEL - Abnormal; Notable for the following:    AST 46 (*)    All other components within normal limits  URINALYSIS, ROUTINE W REFLEX MICROSCOPIC - Abnormal; Notable for the following:    Hgb urine dipstick TRACE (*)    All other components within normal limits  URINE MICROSCOPIC-ADD ON - Abnormal; Notable for the following:    Squamous Epithelial / LPF FEW (*)    All other components within normal limits  LIPASE, BLOOD  POCT I-STAT TROPONIN I   Dg Abd Acute W/chest  11/21/2012  *RADIOLOGY REPORT*  Clinical Data: Abdominal pain  ACUTE ABDOMEN SERIES (ABDOMEN 2 VIEW & CHEST 1 VIEW)  Comparison: 01/15/2012  Findings: Cardiomediastinal silhouette is  unremarkable.  No acute infiltrate or pleural effusion.  No pulmonary edema.  There is nonspecific nonobstructive bowel gas pattern.  Mild lumbar levoscoliosis.  Postsurgical changes L4-L5 level.  Disc space flattening lumbar spine L3-4 level.  No free abdominal air.  IMPRESSION: No acute disease.  Nonspecific nonobstructive bowel gas pattern. Postsurgical changes lumbar spine at L4-L5 level.   Original Report Authenticated By: Natasha Mead, M.D.    The patient will be admitted for serial enzymes based on her vague pressurized sensation in the upper abd and lower chest and into her back. Spoke with the, [facility], who agrees come down and evaluate the patient for admission.     MDM   Date: 11/21/2012  Rate: 81  Rhythm: normal sinus rhythm  QRS Axis: normal  Intervals: normal  ST/T Wave abnormalities: normal  Conduction Disutrbances:none  Narrative Interpretation:   Old EKG Reviewed: unchanged   MDM Reviewed: nursing note and vitals Interpretation: labs, ECG, x-ray and CT scan             Carlyle Dolly, PA-C 11/21/12 1622

## 2012-11-21 NOTE — ED Notes (Signed)
Report to Laura, RN

## 2012-11-21 NOTE — H&P (Signed)
[facility] History and Physical  Rachel Kelly ZOX:[id number] DOB: 04/22/1946 DOA: 11/21/2012  Referring physician: Otila Kluver, ED PA PCP: Evlyn Courier, MD  Specialists: GI Dr. Loreta Ave   Chief Complaint: N/V  HPI: Rachel Kelly is a 67 y.o. female who presented to [facility] ed with N/V and soft stool.  THis started this morning.  She states she had 3-4 epiosdes of vomiting at home.  One other epsidoe was bloody and the final one was bilious.   SHe decided to come to be seen. The symtpoms started about 09:15 prior to going to [place]. This has never happened before.  SHe states the N/V started first.  SHe had some mild pain but not specific  And had some tight ness between her shoudler pains and her chest She states she had 3 epuisodes of normal stool, but soft, no blood in it.  Not watery.  Not been around anyone sick or with fever. SHe states she has been experienceing regusrgitation occasionally, i not on any NSAIDs'  SHE takes only tylenol occasionally  SHe states the regurgitation feeling was last week She reports that she went out last night and ate at a restaurant. She usually does have a lot of mucus outside of her home. She states she's not sure if anyone else was sick.   Review of Systems: The patient denies chest pain currently blurred vision double vision weakness any one side body dark urine dark stool falls weakness cough fever chills rigors   Past Medical History  Diagnosis Date  . Hyperlipidemia   . Hypertension   . Sarcoidosis   . PAH (pulmonary artery hypertension)   . Hernia   . Anemia   . Blood transfusion 2005  . Wheezing   . Shortness of breath     on exertion  . Sleep apnea     on o2 at night  . HH (hiatus hernia)    Chart review Lap repair Hiatal hernia Admission for fusion of Post spine and Laminectomy l4-5-notable h/o Sarcoidosis   Past Surgical History  Procedure Laterality Date  . Vesicovaginal fistula closure w/ tah    . Esophagogastroduodenoscopy   06/24/2012    Procedure: ESOPHAGOGASTRODUODENOSCOPY (EGD);  Surgeon: Kandis Cocking, MD;  Location: [facility];  Service: General;  Laterality: N/A;  . Back surgery  2005    lower  . Abdominal hysterectomy  1988  . Hiatal hernia repair  08/18/2012    Procedure: LAPAROSCOPIC REPAIR OF HIATAL HERNIA;  Surgeon: Mariella Saa, MD;  Location: [facility];  Service: General;  Laterality: N/A;  Laparoscopic Hiatal Hernia Repair   . Insertion of mesh  08/18/2012    Procedure: INSERTION OF MESH;  Surgeon: Mariella Saa, MD;  Location: [facility];  Service: General;;   Social History:  reports that she has never smoked. She has never used smokeless tobacco. She reports that she does not drink alcohol or use illicit drugs. 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 Penicillins Rash    Fine red bumps that spread all over the body.  . Skin Adhesives (Cyanoacrylate) Itching and Rash    Dermabond    Family History  Problem Relation Age of Onset  . Coronary artery disease    . Cancer      ? type  . Clotting disorder Mother     PE  . Heart disease Father   . Cancer Maternal Grandmother  not sure type, but spread to the brain.   reviewed  Prior to Admission medications   Medication Sig Start Date End Date Taking? Authorizing Provider  acetaminophen (TYLENOL) 500 MG tablet Take 1,000 mg by mouth every 6 (six) hours as needed. Pain   Yes Historical Provider, MD  Carboxymethylcellul-Glycerin (OPTIVE) 0.5-0.9 % SOLN Apply 1 drop to eye 3 (three) times daily as needed. Dry eyes   Yes Historical Provider, MD  Cholecalciferol (VITAMIN D) 2000 UNITS CAPS Take 1 capsule by mouth daily.   Yes Historical Provider, MD  Coenzyme Q10 200 MG TABS Take 200 mg by mouth daily.   Yes Historical Provider, MD  ketotifen (ALAWAY) 0.025 % ophthalmic solution Place 1 drop into both eyes 2 (two) times daily.   Yes Historical Provider, MD  Magnesium 500 MG TABS Take 500  mg by mouth daily.   Yes Historical Provider, MD  NON FORMULARY Oxygen and cpap at night   Yes Historical Provider, MD  potassium gluconate 595 MG TABS Take 595 mg by mouth daily.    Yes Historical Provider, MD  simvastatin (ZOCOR) 20 MG tablet Take 20 mg by mouth every morning.    Yes Historical Provider, MD  triamterene-hydrochlorothiazide (MAXZIDE-25) 37.5-25 MG per tablet Take 1 tablet by mouth every morning.    Yes Historical Provider, MD  urea (CARMOL) 40 % CREA as needed.  07/28/12  Yes Historical Provider, MD   Physical Exam: Filed Vitals:   11/21/12 1130 11/21/12 1145 11/21/12 1200 11/21/12 1215  BP: 131/72 133/73 134/70 124/69  Pulse: 69 70 67 69  Temp:      TempSrc:      Resp: 17 8 23 24   SpO2: 95% 99% 94% 94%     General:  A pleasant oriented slightly dry mucosal  Eyes: Clear no icterus pallor  ENT: Moderate dentition mucosa moist  Neck: Soft supple no thyromegaly  Cardiovascular: S1-S2 no murmur rub or gallop  Respiratory: Clinically clear  Abdomen: Soft nontender but slight tenderness in the epigastrium on deep palpation  Skin: No lower extremity edema  Musculoskeletal: Range of motion intact  Psychiatric: Euthymic  Neurologic: Grossly intact  Labs on Admission:  Basic Metabolic Panel:  Recent Labs Lab 11/21/12 1042  NA 142  K 3.4*  CL 108  CO2 26  GLUCOSE 137*  BUN 22  CREATININE 1.09  CALCIUM 9.1   Liver Function Tests:  Recent Labs Lab 11/21/12 1042  AST 46*  ALT 28  ALKPHOS 77  BILITOT 0.6  PROT 7.4  ALBUMIN 3.5    Recent Labs Lab 11/21/12 1042  LIPASE 52   No results found for this basename: AMMONIA,  in the last 168 hours CBC:  Recent Labs Lab 11/21/12 1042  WBC 8.0  HGB 12.0  HCT 35.3*  MCV 87.6  PLT 184   Cardiac Enzymes: No results found for this basename: CKTOTAL, CKMB, CKMBINDEX, TROPONINI,  in the last 168 hours  BNP (last 3 results) No results found for this basename: PROBNP,  in the last 8760  hours CBG: No results found for this basename: GLUCAP,  in the last 168 hours  Radiological Exams on Admission: Ct Abdomen Pelvis W Contrast  11/21/2012  *RADIOLOGY REPORT*  Clinical Data: Diffuse abdominal pain  CT ABDOMEN AND PELVIS WITH CONTRAST  Technique:  Multidetector CT imaging of the abdomen and pelvis was performed following the standard protocol during bolus administration of intravenous contrast.  Contrast: OMNIPAQUE IOHEXOL 300 MG/ML  SOLN  Comparison: CT  scan of the chest 01/15/2012  Findings: Lung bases are unremarkable.  The patient is status post hiatal hernia repair.  There is thickening of the distal esophageal wall. Although this may be postsurgical in nature clinical correlation is necessary.  This is best seen on axial image six.  Mild distended gallbladder without evidence of calcified gallstones.  Small amount of contrast material probable reflux noted in distal esophagus.  Sagittal images of the spine shows postsurgical changes at L4-L5 level.  Degenerative changes are noted at L3-L4 level.  The liver, spleen, pancreas and adrenals are unremarkable.  Enhanced kidneys are symmetrical in size.  No hydronephrosis or hydroureter.  There is a high density exophytic lesion in the mid pole of the right kidney measures 7 mm.  A solid lesion in the noted in the lower pole posterior aspect of the left kidney measures 1.9 cm.  This are highly suspicious for renal cell carcinoma.  Further evaluation with enhanced MRI is recommended.  No aortic aneurysm.  No small bowel obstruction.  No ascites or free air.  No adenopathy.  There is no pericecal inflammation.  The terminal ileum is unremarkable.  The patient is status post hysterectomy.  The urinary bladder is unremarkable.  No destructive bony lesions are noted within pelvis. Bilateral renal there is symmetrical excretion.  Bilateral visualized proximal ureter is unremarkable.  IMPRESSION:  1.  The patient is status post hiatal hernia repair.   There is thickening of distal esophageal wall.  Although this may be postsurgical in nature gastroesophageal reflux disease cannot be excluded.  Clinical correlation is necessary. 2.  No hydronephrosis or hydroureter.  Bilateral renal lesions are noted. High density exophytic lesion mid pole of the right kidney measures 7 mm.  Solid lesion in the lower pole of the left kidney posteriorly measures 1.9 cm.  These are suspicious for renal cell carcinoma.  Further evaluation with enhanced MRI is recommended. 3.  Bilateral renal symmetrical excretion.  4.  Postsurgical changes lumbar spine. 5.  Status post hysterectomy.   Original Report Authenticated By: Natasha Mead, M.D.    Dg Abd Acute W/chest  11/21/2012  *RADIOLOGY REPORT*  Clinical Data: Abdominal pain  ACUTE ABDOMEN SERIES (ABDOMEN 2 VIEW & CHEST 1 VIEW)  Comparison: 01/15/2012  Findings: Cardiomediastinal silhouette is unremarkable.  No acute infiltrate or pleural effusion.  No pulmonary edema.  There is nonspecific nonobstructive bowel gas pattern.  Mild lumbar levoscoliosis.  Postsurgical changes L4-L5 level.  Disc space flattening lumbar spine L3-4 level.  No free abdominal air.  IMPRESSION: No acute disease.  Nonspecific nonobstructive bowel gas pattern. Postsurgical changes lumbar spine at L4-L5 level.   Original Report Authenticated By: Natasha Mead, M.D.     EKG: Independently reviewed. Sinus rhythm PR interval 0.08 QRS axis -20 low voltage complexes no ST-T wave inversions across precordial leads compared with EKG performed 08/12/2012 no significant change  Assessment/Plan Active Problems:   Sarcoidosis   HYPERLIPIDEMIA   HYPERTENSION   HIATAL HERNIA   DYSPNEA   Obstructive sleep apnea   Back pain, thoracic   Nausea & vomiting   1. Nausea vomiting and soft stool-presentation sounds suspicious for gastroenteritis-potentially she contracted and illness from eating at restaurant food. I asked her to confirm with her colleagues whom she ate to  confirm whether any of them got sick. I have low suspicion at this time for this being mesenteric ischemia, gasto reflux disease or C. difficile colitis although I will place her on Protonix twice a day IV  for now.  2. Bilateral renal masses-suspicious for renal cell carcinoma-will hydrate patient and insure she can take by mouth and then will discuss outpatient evaluation of same with Dr. Ellender Hose may need outpatient urology 3. Obstructive sleep apnea-continue regular BiPAP at home per RT. 4. Htn-well controlled.  Hold thiazide for now given mild renal insufficiency 5. Acute kidney injury BUN/creatinine 22/1.09-continue IV fluids 75 cc per hour as n.p.o. 6. Mild hypokalemia-recheck in a.m. replace as below 3.2 7. ? CP-unlikely cardiogenic but will rule out by cardiac markers given hypertensive hyperlipidemic and history of family member having MI. 8. Potential hepatic steatosis-patient not a drinker AST to ALT elevated to 2 to 1hion. May need followup as an outpatient-repeat Cmet in a.m. 9. Sarcoidosis-outpatient pulmonary followup-currently not on steroids  none   Code Status: Full  Family Communication: Discussed with daughter at  Disposition Plan: obs/telemetry   Time spent: 50 minutes  Mahala Menghini [facility] Pager [phone]  If 7PM-7AM, please contact night-coverage [email or web address] Password [email or web address] 11/21/2012, 4:14 PM

## 2012-11-22 ENCOUNTER — Observation Stay (HOSPITAL_COMMUNITY): Payer: Medicare Other

## 2012-11-22 LAB — COMPREHENSIVE METABOLIC PANEL
AST: 147 U/L — ABNORMAL HIGH (ref 0–37)
BUN: 16 mg/dL (ref 6–23)
CO2: 24 mEq/L (ref 19–32)
Calcium: 8.8 mg/dL (ref 8.4–10.5)
Creatinine, Ser: 0.98 mg/dL (ref 0.50–1.10)
GFR calc non Af Amer: 58 mL/min — ABNORMAL LOW (ref >90)

## 2012-11-22 LAB — CBC
Hemoglobin: 11.4 g/dL — ABNORMAL LOW (ref 12.0–15.0)
MCH: 28.9 pg (ref 26.0–34.0)
RBC: 3.94 MIL/uL (ref 3.87–5.11)
WBC: 7.5 10*3/uL (ref 4.0–10.5)

## 2012-11-22 LAB — TROPONIN I
Troponin I: <0.3 ng/mL (ref <0.30)
Troponin I: <0.3 ng/mL (ref <0.30)

## 2012-11-22 MED ORDER — ACETAMINOPHEN 325 MG PO TABS
650.0000 mg | ORAL_TABLET | Freq: Four times a day (QID) | ORAL | Status: DC | PRN
  Administered 2012-11-22: 650 mg via ORAL

## 2012-11-22 NOTE — ED Provider Notes (Signed)
Medical screening examination/treatment/procedure(s) were conducted as a shared visit with non-physician practitioner(s) and myself.  I personally evaluated the patient during the encounter  Patient presented with complaints of pressure sensation in her upper abdomen and chest. No definite evidence of cardiac ischemia. However considering her age and risk factors discussed with the medical service regarding admission for further cardiac evaluation    Celene Kras, MD 11/22/12 1239

## 2012-11-22 NOTE — Progress Notes (Signed)
PROGRESS NOTE  Rachel Kelly:[id number] DOB: [birth year] DOA: 11/21/2012 PCP: Evlyn Courier, MD  Brief narrative: 67 yr old AAF admitted with ? Gastroenteritis and found to have elevated Liver enzymes in a setting of renal masses suspicious for Renal cell ca  Past medical history-As per Problem list Chart reviewed as below- none  Consultants:  None currently  Procedures:  Acute abd series 11/21/12  Ct abd pelvis 11/21/12  [place] abd pending  Antibiotics:   none   Subjective  One episode of vomit yesterday pm.  No further episodes.  tol clear liquids.  Passed stool yesterday denies abd pain at present.   Objective    Interim History: See above  Telemetry: PVC's c NSR  Objective: Filed Vitals:   11/21/12 2102 11/21/12 2104 11/21/12 2107 11/22/12 0500  BP: 146/84 135/83 129/85 116/70  Pulse: 79 91 85 74  Temp:    99.2 F (37.3 C)  TempSrc:      Resp:    18  Height:      Weight:      SpO2:    94%    Intake/Output Summary (Last 24 hours) at 11/22/12 0931 Last data filed at 11/22/12 0700  Gross per 24 hour  Intake 933.75 ml  Output    451 ml  Net 482.75 ml    Exam:  General: alert pleasant oriented, no pallor or icterus Cardiovascular: s1 s2 no m/r/g Respiratory: clear, no added sound Abdomen:  Soft, no organomegally, non tender, non distended  Skinno ict/pall Neuro Intact  Data Reviewed: Basic Metabolic Panel:  Recent Labs Lab 11/21/12 1042 11/22/12 0100  NA 142 137  K 3.4* 3.3*  CL 108 103  CO2 26 24  GLUCOSE 137* 107*  BUN 22 16  CREATININE 1.09 0.98  CALCIUM 9.1 8.8   Liver Function Tests:  Recent Labs Lab 11/21/12 1042 11/22/12 0100  AST 46* 147*  ALT 28 115*  ALKPHOS 77 89  BILITOT 0.6 0.7  PROT 7.4 6.8  ALBUMIN 3.5 3.1*    Recent Labs Lab 11/21/12 1042  LIPASE 52   No results found for this basename: AMMONIA,  in the last 168 hours CBC:  Recent Labs Lab 11/21/12 1042 11/21/12 1929 11/22/12 0100  WBC 8.0 7.7  7.5  HGB 12.0 11.7* 11.4*  HCT 35.3* 33.8* 33.4*  MCV 87.6 85.4 84.8  PLT 184 188 170   Cardiac Enzymes:  Recent Labs Lab 11/21/12 1604 11/21/12 1929 11/22/12 0056  TROPONINI <0.30 <0.30 <0.30   BNP: No components found with this basename: POCBNP,  CBG: No results found for this basename: GLUCAP,  in the last 168 hours  No results found for this or any previous visit (from the past 240 hour(s)).   Studies:              All Imaging reviewed and is as per above notation   Scheduled Meds: . olopatadine  1 drop Both Eyes BID  . pantoprazole (PROTONIX) IV  40 mg Intravenous Q12H  . sodium chloride  3 mL Intravenous Q12H   Continuous Infusions: . sodium chloride 75 mL/hr at 11/21/12 2009    Assessment/Plan: 1. Abdominal pain-question gastroenteritis versus malignant spread of renal cell carcinoma.-Given elevated LFTs and lack of fever or and predominant hepatocellular dysfunction, more concerned regarding the latter. We'll get ultrasound of abdomen and potentially consult urologist for recommendations if ultrasound does not show nonalcoholic steato hepatitis versus cirrhosis.  Keep only on clear liquids for now. 2. Recent surgery and  hiatal hernia-normal 3. Sarcoidosis-stable 4. Hyperlipidemia-stable 5. Hypertension-controlled at present  Code Status: Full Family Communication: None at bedside presently Disposition Plan: Obs   Pleas Koch, MD  [facility] Pager [phone] 11/22/2012, 9:31 AM    LOS: 1 day

## 2012-11-23 LAB — COMPREHENSIVE METABOLIC PANEL
Albumin: 3.1 g/dL — ABNORMAL LOW (ref 3.5–5.2)
Alkaline Phosphatase: 78 U/L (ref 39–117)
BUN: 10 mg/dL (ref 6–23)
Chloride: 106 mEq/L (ref 96–112)
Potassium: 3.6 mEq/L (ref 3.5–5.1)
Total Bilirubin: 0.6 mg/dL (ref 0.3–1.2)

## 2012-11-23 MED ORDER — OMEPRAZOLE 20 MG PO CPDR: 20.0000 mg | DELAYED_RELEASE_CAPSULE | Freq: Every day | ORAL | Status: DC

## 2012-11-23 NOTE — Discharge Summary (Signed)
Physician Discharge Summary  Rachel Kelly ZOX:[id number] DOB: 07/27/1945 DOA: 11/21/2012  PCP: Evlyn Courier, MD  Admit date: 11/21/2012 Discharge date: 11/23/2012  Time spent: 40 minutes  Recommendations for Outpatient Follow-up:  1. Needs out-patient re-evaluation for nausea vomiting diarrhea if this recurs-gastroenterologist Dr. Loreta Ave vs Surgeon dr. Johna Sheriff she should followup with her for potential endoscopy as she has history of hiatal hernia 2.  Needs outpatient evaluation with Dr. Berneice Heinrich of  urology to determine if she is candidate for urological surgery as she has bilateral small renal masses 3. Get a complete metabolic panel in a week to monitor for resolution of LFTs  Discharge Diagnoses:  Active Problems:   Sarcoidosis   HYPERLIPIDEMIA   HYPERTENSION   HIATAL HERNIA   DYSPNEA   Obstructive sleep apnea   Back pain, thoracic   Nausea & vomiting   Bilateral renal masses   Discharge Condition: Good  Diet recommendation: Heart healthy low-salt bland  Filed Weights   11/21/12 1835  Weight: 79.3 kg (174 lb 13.2 oz)    History of present illness:  This 67 year old African American female with history of large hiatal hernia repaired recently 08/05/2012 presented to [facility] 11/21/2012 with nausea vomiting soft stool she had multiple episodes of vomiting final one was bloody and bilious-Since her discharge in in the morning and not related to food=-she does state and certainly that the night before she eaten at a restaurant that she's not familiar with. She does not take any NSAIDs and occasionally has regurgitation but this is resolved since her recent surgery. She was observed over the course of one to 2 days and it was noted she is still somewhat nauseous however this improved-coincidentally a CT scan of the abdomen showed small bilateral renal masses 1.6 cm with 0.9 cm-I. did discuss briefly with urologist on call who recommended this need an MRCP but this can be  followed up as an outpatient The patient did have LFTs that raised progressively during hospital stay-she felt subsequently better and underwent an ultrasound which did not show any specific gallbladder pathology. As such it was thought that patient probably had a transient gastroenteritis versus probably some mild GERD /nonalcoholic fatty liver disease and was encouraged for the care physician as an outpatient.  Discharge Exam: Filed Vitals:   11/23/12 0515 11/23/12 0517 11/23/12 0519 11/23/12 0522  BP: 132/58 146/83 131/76 144/78  Pulse: 64 66 73 77  Temp: 98.7 F (37.1 C)     TempSrc: Oral     Resp: 18 18 18 18   Height:      Weight:      SpO2: 96% 99% 98% 100%   Well doing okay mild nausea today but no other issues  General: EOMI NCAT no pallor no icterus Cardiovascular: S1-S2 no murmur rub or galop Respiratory: Clear  Discharge Instructions  Discharge Orders   Future Orders Complete By Expires     Diet - low sodium heart healthy  As directed     Increase activity slowly  As directed         Medication List    STOP taking these medications       acetaminophen 500 MG tablet  Commonly known as:  TYLENOL      TAKE these medications       ALAWAY 0.025 % ophthalmic solution  Generic drug:  ketotifen  Place 1 drop into both eyes 2 (two) times daily.     Coenzyme Q10 200 MG Tabs  Take 200  mg by mouth daily.     Magnesium 500 MG Tabs  Take 500 mg by mouth daily.     NON FORMULARY  Oxygen and cpap at night     omeprazole 20 MG capsule  Commonly known as:  PRILOSEC  Take 1 capsule (20 mg total) by mouth daily.     OPTIVE 0.5-0.9 % Soln  Generic drug:  Carboxymethylcellul-Glycerin  Apply 1 drop to eye 3 (three) times daily as needed. Dry eyes     potassium gluconate 595 MG Tabs  Take 595 mg by mouth daily.     simvastatin 20 MG tablet  Commonly known as:  ZOCOR  Take 20 mg by mouth every morning.     triamterene-hydrochlorothiazide 37.5-25 MG per tablet   Commonly known as:  MAXZIDE-25  Take 1 tablet by mouth every morning.     urea 40 % Crea  Commonly known as:  CARMOL  as needed.     Vitamin D 2000 UNITS Caps  Take 1 capsule by mouth daily.       Allergies  Allergen Reactions  . Penicillins Rash    Fine red bumps that spread all over the body.  . Skin Adhesives (Cyanoacrylate) Itching and Rash    Dermabond       Follow-up Information   Follow up with Evlyn Courier, MD.   Contact information:   [address] [place] [address] [phone]        The results of significant diagnostics from this hospitalization (including imaging, microbiology, ancillary and laboratory) are listed below for reference.    Significant Diagnostic Studies: US Abdomen Complete  11/22/2012  *RADIOLOGY REPORT*  Clinical Data:  Elevated LFTs  COMPLETE ABDOMINAL ULTRASOUND  Comparison:  CT abdomen pelvis - 11/21/2012  Findings:  Gallbladder:  Sonographically normal.  No echogenic gallstones or gall sludge.  No gallbladder wall thickening or pericholecystic fluid.  Negative sonographic Murphy's sign.  Common bile duct:  Normal in size for age measuring 7 mm in diameter  Liver:  Homogeneous hepatic echotexture.  No discrete hepatic lesions.  No definite evidence of intrahepatic biliary ductal dilatation.  No ascites.  IVC:  Appears normal.  Pancreas:  Limited visualization of the pancreatic head and neck is normal.  Visualization of the pancreatic body and tail is obscured by bowel gas.  Spleen:  Normal in size measuring 4.8 cm in length  Right Kidney:  Normal cortical thickness, echogenicity and size, measuring 10.1 cm in length.  There is an approximately 1.0 x 0.9 x 0.8 cm (images 76 through 81) exophytic mixed echogenic solid lesion arising from the mid aspect of the right kidney, compatible with the lesion seen on recently performed abdominal CT.  No echogenic renal stones.  No urinary obstruction.  Left Kidney:  Normal cortical  thickness, echogenicity and size, measuring 10.9 cm in length.  There is an approximately 2.0 x 1.7 x 2.0 mixed echogenic partially exophytic solid lesion which arises from the inferior pole of the left kidney (images 65 through 69) compatible with the lesion seen on recently performed abdominal CT. No echogenic renal stones.  No urinary obstruction.  Abdominal aorta:  No aneurysm identified.  IMPRESSION: 1.  No explanation for patient's elevated liver function tests. Specifically, no definite evidence of intra or extrahepatic biliary ductal dilatation. 2.  Bilateral indeterminate renal lesions, the largest of which within the left kidney measures approximately 2 cm in diameter, neither of which may be characterized as simple renal cysts.  As both the lesions again remain concerning for renal cell carcinoma, further evaluation with non emergent abdominal MRI is recommended.   Original Report Authenticated By: Tacey Ruiz, MD    Ct Abdomen Pelvis W Contrast  11/21/2012  *RADIOLOGY REPORT*  Clinical Data: Diffuse abdominal pain  CT ABDOMEN AND PELVIS WITH CONTRAST  Technique:  Multidetector CT imaging of the abdomen and pelvis was performed following the standard protocol during bolus administration of intravenous contrast.  Contrast: OMNIPAQUE IOHEXOL 300 MG/ML  SOLN  Comparison: CT scan of the chest 01/15/2012  Findings: Lung bases are unremarkable.  The patient is status post hiatal hernia repair.  There is thickening of the distal esophageal wall. Although this may be postsurgical in nature clinical correlation is necessary.  This is best seen on axial image six.  Mild distended gallbladder without evidence of calcified gallstones.  Small amount of contrast material probable reflux noted in distal esophagus.  Sagittal images of the spine shows postsurgical changes at L4-L5 level.  Degenerative changes are noted at L3-L4 level.  The liver, spleen, pancreas and adrenals are unremarkable.  Enhanced kidneys  are symmetrical in size.  No hydronephrosis or hydroureter.  There is a high density exophytic lesion in the mid pole of the right kidney measures 7 mm.  A solid lesion in the noted in the lower pole posterior aspect of the left kidney measures 1.9 cm.  This are highly suspicious for renal cell carcinoma.  Further evaluation with enhanced MRI is recommended.  No aortic aneurysm.  No small bowel obstruction.  No ascites or free air.  No adenopathy.  There is no pericecal inflammation.  The terminal ileum is unremarkable.  The patient is status post hysterectomy.  The urinary bladder is unremarkable.  No destructive bony lesions are noted within pelvis. Bilateral renal there is symmetrical excretion.  Bilateral visualized proximal ureter is unremarkable.  IMPRESSION:  1.  The patient is status post hiatal hernia repair.  There is thickening of distal esophageal wall.  Although this may be postsurgical in nature gastroesophageal reflux disease cannot be excluded.  Clinical correlation is necessary. 2.  No hydronephrosis or hydroureter.  Bilateral renal lesions are noted. High density exophytic lesion mid pole of the right kidney measures 7 mm.  Solid lesion in the lower pole of the left kidney posteriorly measures 1.9 cm.  These are suspicious for renal cell carcinoma.  Further evaluation with enhanced MRI is recommended. 3.  Bilateral renal symmetrical excretion.  4.  Postsurgical changes lumbar spine. 5.  Status post hysterectomy.   Original Report Authenticated By: Natasha Mead, M.D.    Dg Abd Acute W/chest  11/21/2012  *RADIOLOGY REPORT*  Clinical Data: Abdominal pain  ACUTE ABDOMEN SERIES (ABDOMEN 2 VIEW & CHEST 1 VIEW)  Comparison: 01/15/2012  Findings: Cardiomediastinal silhouette is unremarkable.  No acute infiltrate or pleural effusion.  No pulmonary edema.  There is nonspecific nonobstructive bowel gas pattern.  Mild lumbar levoscoliosis.  Postsurgical changes L4-L5 level.  Disc space flattening lumbar spine  L3-4 level.  No free abdominal air.  IMPRESSION: No acute disease.  Nonspecific nonobstructive bowel gas pattern. Postsurgical changes lumbar spine at L4-L5 level.   Original Report Authenticated By: Natasha Mead, M.D.     Microbiology: No results found for this or any previous visit (from the past 240 hour(s)).   Labs: Basic Metabolic Panel:  Recent Labs Lab 11/21/12 1042 11/22/12 0100 11/23/12 0745  NA 142 137 137  K 3.4* 3.3* 3.6  CL  108 103 106  CO2 26 24 24   GLUCOSE 137* 107* 100*  BUN 22 16 10   CREATININE 1.09 0.98 0.99  CALCIUM 9.1 8.8 8.9   Liver Function Tests:  Recent Labs Lab 11/21/12 1042 11/22/12 0100 11/23/12 0745  AST 46* 147* 42*  ALT 28 115* 60*  ALKPHOS 77 89 78  BILITOT 0.6 0.7 0.6  PROT 7.4 6.8 6.6  ALBUMIN 3.5 3.1* 3.1*    Recent Labs Lab 11/21/12 1042  LIPASE 52   No results found for this basename: AMMONIA,  in the last 168 hours CBC:  Recent Labs Lab 11/21/12 1042 11/21/12 1929 11/22/12 0100  WBC 8.0 7.7 7.5  HGB 12.0 11.7* 11.4*  HCT 35.3* 33.8* 33.4*  MCV 87.6 85.4 84.8  PLT 184 188 170   Cardiac Enzymes:  Recent Labs Lab 11/21/12 1604 11/21/12 1929 11/22/12 0056 11/22/12 0910  TROPONINI <0.30 <0.30 <0.30 <0.30   BNP: BNP (last 3 results) No results found for this basename: PROBNP,  in the last 8760 hours CBG: No results found for this basename: GLUCAP,  in the last 168 hours     Signed:  Rhetta Mura  [facility] 11/23/2012, 1:51 PM

## 2012-11-23 NOTE — Progress Notes (Signed)
Reviewed discharge instructions with patient and she stated her understanding.  Patient discharged home with family via wheelchair.  Colman Cater

## 2012-12-20 ENCOUNTER — Telehealth (INDEPENDENT_AMBULATORY_CARE_PROVIDER_SITE_OTHER): Payer: Self-pay | Admitting: General Surgery

## 2012-12-20 NOTE — Telephone Encounter (Signed)
Patient calling status post hiatal hernia repair 08/18/2012 by Dr Johna Sheriff calling because she has had issues for the past month with vomiting and diarrhea. She states she started vomiting on 11/21/2012 and went to the ER, they ran some tests and released her on 11/23/2012. She has had no vomiting since that time and told to follow up with her PCP. She saw her PCP on 11/30/2012 and started having daily issues with diarrhea. She states the diarrhea lasted from 11/30/2012- 12/17/2012. Her PCP made a referral for her to follow up with Dr Bosie Clos but she did not keep appt because she states their was some confusion. She is not having problems now. I advised her to re-schedule her appt with Dr Bosie Clos for follow up and I would let Dr Johna Sheriff know about her recent symptoms.

## 2013-01-17 ENCOUNTER — Other Ambulatory Visit (HOSPITAL_COMMUNITY): Payer: Self-pay | Admitting: Urology

## 2013-01-17 DIAGNOSIS — N289 Disorder of kidney and ureter, unspecified: Secondary | ICD-10-CM

## 2013-01-18 ENCOUNTER — Encounter: Payer: Self-pay | Admitting: Internal Medicine

## 2013-01-18 ENCOUNTER — Ambulatory Visit (INDEPENDENT_AMBULATORY_CARE_PROVIDER_SITE_OTHER): Payer: Medicare Other | Admitting: Internal Medicine

## 2013-01-18 ENCOUNTER — Other Ambulatory Visit: Payer: Self-pay

## 2013-01-18 VITALS — BP 110/80 | HR 69 | Ht 60.0 in | Wt 178.6 lb

## 2013-01-18 DIAGNOSIS — G4733 Obstructive sleep apnea (adult) (pediatric): Secondary | ICD-10-CM

## 2013-01-18 DIAGNOSIS — Z1231 Encounter for screening mammogram for malignant neoplasm of breast: Secondary | ICD-10-CM

## 2013-01-18 DIAGNOSIS — D869 Sarcoidosis, unspecified: Secondary | ICD-10-CM

## 2013-01-18 NOTE — Progress Notes (Signed)
Patient ID: Rachel Kelly, female    DOB: 1945/09/10, 67 y.o.   MRN: [id number] PCP Dr [name]  HPI 12/27/10- 35 yoF never smoker followed for sarcoid, complicated by dyspnea, hiatal hernia/ GERD risk Last here September 26, 2010- note reviewed. Labs were updated from 12/11 visit. Since March says she has done very well, denying dyspnea, wheeze or night sweats Admits just a little dry cough occasionally- not concerned. No rash or nodes. She has lost some weight and would like to retest oxygen to see if she can stop O2 used now just at night. She did not desat on 6 MWT in December. DLCO was 53% then.   04/28/11-  65 yoF never smoker followed for sarcoid, complicated by dyspnea, hiatal hernia/ GERD risk She considers herself "low risk" and therefore declines flu vaccine.Overnight oximetry on room air 03/05/2011 recorded over 48 minutes with oxygen saturation less than or equal to 88%. We discussed home oxygen for sleep. She or he has home oxygen for sleep from [facility] at 2 L per minute but had hoped to come off. She understands that she should continue. She prefers to sleep on her stomach but has been told she snores. We discussed the possibility of sleep apnea given her body build, as a risk contributing to the oxygen situation. She is alone with no reporter. She is interested in having a sleep study done. She denies cough, wheeze, chest discomfort, fever, enlarged lymph nodes, or rash. PFT 07/08/2010-FEV1 1.22/68%, FEV1/FVC 0.80, small airway flows 56%. Insignificant response to bronchodilator. TLC 65%, DLCO 53%. CXR 06/28/10- CHEST - 2 VIEW  Comparison: 07/11/2009 chest radiograph.  Findings: Again seen is a large hiatal hernia with associated air-  fluid level. Cardiomegaly is stable. Mild prominence of the  inferior right paratracheal soft tissues are stable. There is some  scarring in the right perihilar region. No discrete focal airspace  disease is identified. Left lung base is  partially obscured by the  large hiatal hernia. No acute bony abnormality is identified.  IMPRESSION:  Stable chest radiograph. Mild stable chronic changes, likely  reflective of patient's known sarcoidosis.  Large hiatal hernia with air-fluid level.  Provider: Lelon Huh   01/13/12- 65 yoF never smoker followed for sarcoid, complicated by dyspnea, hiatal hernia/ GERD risk ACUTE VISIT:  Increased SOB-recently gained weight but unsure if this is the cause of SOB; nothing has changed since last visit. Progressive shortness of breath is bothersome for 4 months, mainly with exertion of walking but not lying supine. Denies cough or wheeze, fever, night sweat, rash or adenopathy. 10 pound weight gain without edema. Has been anemic most of her life. Denies cardiac history and reports normal stress test 2 or 3 years ago.  03/15/12- 66 yoF never smoker followed for sarcoid, complicated by dyspnea, hiatal hernia/ GERD risk  Patient states about the same as last visit. Still c/o sob. States her weight is staying  steady.  D-dimer 1.29 led to CT chest: CT chest - 01/14/12-images reviewed with her IMPRESSION:  1. No pulmonary emboli.  2. Huge hiatal hernia which compresses the left lung, the heart,  and the bronchi.  2. Bronchitic changes which could be acute or chronic.  3. Multiple calcified lymph nodes in the mediastinum.  Original Report Authenticated By: Larey Seat, M.D.  Office Spirometry-within normal limits  04/30/12- 29 yoF never smoker followed for sarcoid, complicated by dyspnea, hiatal hernia/ GERD risk.  Still having SOB even at rest. 6MW  never got scheduled. We have reviewed her chest x-ray and CT scan with her and showed her the compression of lung expansion by her huge hiatal hernia. She has been evaluated by Dr. Johna Sheriff and now plans-hernia repair in January. Her exertional dyspnea at is no worse. Dyspnea is aggravated by bending over as expected. There is little cough and no  acute change. She is using oxygen 2L/ [facility] at night and aware that she snores some but not told that she stops breathing  06/22/12- 66 yoF never smoker followed for sarcoid, complicated by dyspnea, hiatal hernia/ GERD risk FOLLOWS FOR: review test and Sleep study with patient She had upper endoscopy and is now pending surgical repair of her huge hiatal hernia after Christmas. No new respiratory issues. - 06/22/2012-94%, 88%, 98%, 417 m  Significant desaturation with exercise, but good distance walked.  NPSG- 05/26/12- AHI 17.6/ hr  Moderate obstructive sleep apnea, body weight 176 pounds  08/02/12- 66 yoF never smoker followed for sarcoid, complicated by dyspnea, hiatal hernia/ GERD risk FOLLOWS FOR: review test and Sleep study with patient To see Dr. Johna Sheriff  planning hiatal hernia repair January 29. Just got CPAP/APS with O2 2 L for sleep. Noting sharp muscles thousand type pains in mid thoracic spine area, increased by leaning forward and getting more frequent. Possibly related to her hernia. No change dyspnea with exertion, no fever, cough, night sweats, palpitation or nodes.  01/18/13- 66 yoF never smoker followed for sarcoid, complicated by dyspnea, hiatal hernia/ GERD risk follows for:  breathing is about the same as last ov.  discuss her CPAP machine.  pt feels that she does not need this machine.  She has no significant shortness of breath now after hiatal hernia reduction surgery. She continues CPAP with autotitration/ APS, but isn't sure she needs it.    Review of Systems-see HPI Constitutional:   No-   weight loss, night sweats, fevers, chills, fatigue, lassitude. HEENT:   No-  headaches, difficulty swallowing, tooth/dental problems, sore throat,       No-  sneezing, itching, ear ache, nasal congestion, post nasal drip,  CV:  No-   chest pain, orthopnea, PND, swelling in lower extremities, anasarca,  dizziness, palpitations Resp: +  shortness of breath with  exertion or at rest.              No-   productive cough,  + non-productive cough,  No-  coughing up of blood.              No-   change in color of mucus.  No- wheezing.   Skin: No-   rash or lesions. GI:  No-   heartburn, indigestion, abdominal pain, nausea, vomiting, ,  + bowels gurgle and loose" GU:  MS:  No-   joint pain or swelling.  Neuro- nothing unusual:  Psych:  No- change in mood or affect. No depression or anxiety.  No memory loss.  Objective:   Physical Exam General- Alert, Oriented, Affect-, and friendly, Distress- none acute, overweight Skin- rash-none, lesions- none, excoriation- none Lymphadenopathy- none Head- atraumatic            Eyes- Gross vision intact, PERRLA, conjunctivae clear secretions            Ears- Hearing, canals-normal            Nose- Clear, no-Septal dev, mucus, polyps, erosion, perforation             Throat- Mallampati III ,  mucosa clear , drainage- none, tonsils- atrophic Neck- flexible , trachea midline, no stridor , thyroid nl, carotid no bruit Chest - symmetrical excursion , unlabored           Heart/CV- RRR , no murmur , no gallop  , no rub, nl s1 s2                           - JVD+ 1 CM , edema- none, stasis changes- none, varices- none           Lung- clear to P&A/ +mildly diminished at left base, wheeze- none, cough- none , dullness-none, rub- none           Chest wall-  Abd-  Br/ Gen/ Rectal- Not done, not indicated Extrem- cyanosis- none, clubbing, none, atrophy- none, strength- nl,  Neuro- grossly intact to observation  Assessment & Plan:

## 2013-01-18 NOTE — Patient Instructions (Addendum)
We will get you back in December and plan then to schedule a new sleep study off CPAP, to see how much you still need it.

## 2013-01-19 ENCOUNTER — Ambulatory Visit (HOSPITAL_COMMUNITY)
Admission: RE | Admit: 2013-01-19 | Discharge: 2013-01-19 | Disposition: A | Discharge status: Home / Self Care | Payer: Medicare Other | Source: Ambulatory Visit | Attending: Urology | Admitting: Urology

## 2013-01-19 DIAGNOSIS — N289 Disorder of kidney and ureter, unspecified: Secondary | ICD-10-CM | POA: Insufficient documentation

## 2013-01-19 DIAGNOSIS — K7689 Other specified diseases of liver: Secondary | ICD-10-CM | POA: Insufficient documentation

## 2013-01-19 LAB — CREATININE, SERUM: Creatinine, Ser: 1.08 mg/dL (ref 0.50–1.10)

## 2013-01-19 MED ORDER — GADOBENATE DIMEGLUMINE 529 MG/ML IV SOLN
16.0000 mL | Freq: Once | INTRAVENOUS | Status: AC | PRN
  Administered 2013-01-19: 16 mL via INTRAVENOUS

## 2013-01-20 ENCOUNTER — Ambulatory Visit (HOSPITAL_COMMUNITY): Admission: RE | Admit: 2013-01-20 | Payer: Medicare Other | Source: Ambulatory Visit

## 2013-01-31 ENCOUNTER — Other Ambulatory Visit: Payer: Self-pay | Admitting: Gastroenterology

## 2013-01-31 NOTE — Assessment & Plan Note (Signed)
She is satisfied to continue CPAP and breathes much better supine after her surgery. We discussed getting a followup NPSG in about 6 months off of CPAP to reassess. By that time she should be stable after her bariatric surgery

## 2013-01-31 NOTE — Assessment & Plan Note (Signed)
Scarring but no obvious reactive for progressive process. This may be burned out.

## 2013-02-07 ENCOUNTER — Ambulatory Visit
Admission: RE | Admit: 2013-02-07 | Discharge: 2013-02-07 | Disposition: A | Discharge status: Home / Self Care | Payer: Medicare Other | Source: Ambulatory Visit

## 2013-02-07 DIAGNOSIS — Z1231 Encounter for screening mammogram for malignant neoplasm of breast: Secondary | ICD-10-CM

## 2013-06-03 ENCOUNTER — Ambulatory Visit (INDEPENDENT_AMBULATORY_CARE_PROVIDER_SITE_OTHER): Payer: Medicare Other | Admitting: General Surgery

## 2013-06-23 ENCOUNTER — Ambulatory Visit (INDEPENDENT_AMBULATORY_CARE_PROVIDER_SITE_OTHER): Payer: Medicare Other | Admitting: General Surgery

## 2013-06-23 ENCOUNTER — Encounter (INDEPENDENT_AMBULATORY_CARE_PROVIDER_SITE_OTHER): Payer: Self-pay | Admitting: General Surgery

## 2013-06-23 ENCOUNTER — Encounter (INDEPENDENT_AMBULATORY_CARE_PROVIDER_SITE_OTHER): Payer: Self-pay

## 2013-06-23 VITALS — BP 118/78 | HR 68 | Temp 97.5°F | Ht <= 58 in | Wt 181.4 lb

## 2013-06-23 DIAGNOSIS — K449 Diaphragmatic hernia without obstruction or gangrene: Secondary | ICD-10-CM

## 2013-06-23 DIAGNOSIS — K219 Gastro-esophageal reflux disease without esophagitis: Secondary | ICD-10-CM

## 2013-06-23 NOTE — Progress Notes (Signed)
Chief complaint: Followup hiatal hernia repair and GI symptoms  History: Patient returns for more long-term followup. She has a history almost one year ago of repair of a giant hiatal hernia with the majority of her stomach in her left chest. At that time she was having significant shortness of breath which we felt was related to her hernia. She did not have any reflux symptoms preoperatively. We did not do an antireflux procedure but just repair her hernia because of this. She has had excellent improvement in her pulmonary status as we had hoped. She however has developed some nighttime regurgitation and heartburn which he did not have preoperatively. She recently was switched to Nexium and has some definite improvement on this medication but still some symptomatology of nighttime reflux. She has some intermittent diarrhea. She has been seeing Dr. Bosie Clos for GI followup.  Exam: BP 118/78  Pulse 68  Temp(Src) 97.5 F (36.4 C) (Temporal)  Ht 4\' 10"  (1.473 m)  Wt 181 lb 6.4 oz (82.283 kg)  BMI 37.92 kg/m2 General: Moderately obese but otherwise well appearing Lungs: Clear equal breath sounds bilaterally Abdomen: Soft and nontender. Wounds are well-healed. No hernias.  Assessment and plan: Overall doing well following hiatal hernia repair. Her pulmonary symptoms are markedly improved and she is very pleased overall the result of her surgery and is definitely happy that she went through it. She does however have some reflux symptoms that she did not have preoperatively. These are fairly well controlled with medication. I wonder if delayed gastric emptying could be contributing to this and I suggested we could possibly obtain a gastric emptying study to evaluate this is another option for treatment. At this point however she does not feel that this is severe enough for her to want to undergo that test. I told her I would forward this information to Dr. Bosie Clos and I will remain available as needed.

## 2013-07-22 ENCOUNTER — Ambulatory Visit (INDEPENDENT_AMBULATORY_CARE_PROVIDER_SITE_OTHER): Payer: Medicare Other | Admitting: Internal Medicine

## 2013-07-22 ENCOUNTER — Encounter: Payer: Self-pay | Admitting: Internal Medicine

## 2013-07-22 VITALS — BP 108/64 | HR 73 | Ht 60.0 in | Wt 181.0 lb

## 2013-07-22 DIAGNOSIS — G4733 Obstructive sleep apnea (adult) (pediatric): Secondary | ICD-10-CM

## 2013-07-22 DIAGNOSIS — K449 Diaphragmatic hernia without obstruction or gangrene: Secondary | ICD-10-CM

## 2013-07-22 DIAGNOSIS — D869 Sarcoidosis, unspecified: Secondary | ICD-10-CM

## 2013-07-22 NOTE — Progress Notes (Signed)
Patient ID: Rachel Kelly, female    DOB: 1945/09/10, 68 y.o.   MRN: [id number] PCP Dr [name]  HPI 12/27/10- 35 yoF never smoker followed for sarcoid, complicated by dyspnea, hiatal hernia/ GERD risk Last here September 26, 2010- note reviewed. Labs were updated from 12/11 visit. Since March says she has done very well, denying dyspnea, wheeze or night sweats Admits just a little dry cough occasionally- not concerned. No rash or nodes. She has lost some weight and would like to retest oxygen to see if she can stop O2 used now just at night. She did not desat on 6 MWT in December. DLCO was 53% then.   04/28/11-  65 yoF never smoker followed for sarcoid, complicated by dyspnea, hiatal hernia/ GERD risk She considers herself "low risk" and therefore declines flu vaccine.Overnight oximetry on room air 03/05/2011 recorded over 48 minutes with oxygen saturation less than or equal to 88%. We discussed home oxygen for sleep. She or he has home oxygen for sleep from [facility] at 2 L per minute but had hoped to come off. She understands that she should continue. She prefers to sleep on her stomach but has been told she snores. We discussed the possibility of sleep apnea given her body build, as a risk contributing to the oxygen situation. She is alone with no reporter. She is interested in having a sleep study done. She denies cough, wheeze, chest discomfort, fever, enlarged lymph nodes, or rash. PFT 07/08/2010-FEV1 1.22/68%, FEV1/FVC 0.80, small airway flows 56%. Insignificant response to bronchodilator. TLC 65%, DLCO 53%. CXR 06/28/10- CHEST - 2 VIEW  Comparison: 07/11/2009 chest radiograph.  Findings: Again seen is a large hiatal hernia with associated air-  fluid level. Cardiomegaly is stable. Mild prominence of the  inferior right paratracheal soft tissues are stable. There is some  scarring in the right perihilar region. No discrete focal airspace  disease is identified. Left lung base is  partially obscured by the  large hiatal hernia. No acute bony abnormality is identified.  IMPRESSION:  Stable chest radiograph. Mild stable chronic changes, likely  reflective of patient's known sarcoidosis.  Large hiatal hernia with air-fluid level.  Provider: Lelon Huh   01/13/12- 65 yoF never smoker followed for sarcoid, complicated by dyspnea, hiatal hernia/ GERD risk ACUTE VISIT:  Increased SOB-recently gained weight but unsure if this is the cause of SOB; nothing has changed since last visit. Progressive shortness of breath is bothersome for 4 months, mainly with exertion of walking but not lying supine. Denies cough or wheeze, fever, night sweat, rash or adenopathy. 10 pound weight gain without edema. Has been anemic most of her life. Denies cardiac history and reports normal stress test 2 or 3 years ago.  03/15/12- 66 yoF never smoker followed for sarcoid, complicated by dyspnea, hiatal hernia/ GERD risk  Patient states about the same as last visit. Still c/o sob. States her weight is staying  steady.  D-dimer 1.29 led to CT chest: CT chest - 01/14/12-images reviewed with her IMPRESSION:  1. No pulmonary emboli.  2. Huge hiatal hernia which compresses the left lung, the heart,  and the bronchi.  2. Bronchitic changes which could be acute or chronic.  3. Multiple calcified lymph nodes in the mediastinum.  Original Report Authenticated By: Larey Seat, M.D.  Office Spirometry-within normal limits  04/30/12- 29 yoF never smoker followed for sarcoid, complicated by dyspnea, hiatal hernia/ GERD risk.  Still having SOB even at rest. 6MW  never got scheduled. We have reviewed her chest x-ray and CT scan with her and showed her the compression of lung expansion by her huge hiatal hernia. She has been evaluated by Dr. [name] and now plans-hernia repair in January. Her exertional dyspnea at is no worse. Dyspnea is aggravated by bending over as expected. There is little cough and no  acute change. She is using oxygen 2L/ [facility] at night and aware that she snores some but not told that she stops breathing  06/22/12- 66 yoF never smoker followed for sarcoid, complicated by dyspnea, hiatal hernia/ GERD risk FOLLOWS FOR: review 6MW test and Sleep study with patient She had upper endoscopy and is now pending surgical repair of her huge hiatal hernia after Christmas. No new respiratory issues. 6MWT- 06/22/2012-94%, 88%, 98%, 417 m  Significant desaturation with exercise, but good distance walked.  NPSG- 05/26/12- AHI 17.6/ hr  Moderate obstructive sleep apnea, body weight 176 pounds  08/02/12- 66 yoF never smoker followed for sarcoid, complicated by dyspnea, hiatal hernia/ GERD risk FOLLOWS FOR: review 6MW test and Sleep study with patient To see Dr. [name]  planning hiatal hernia repair January 29. Just got CPAP/APS with O2 2 L for sleep. Noting sharp muscles thousand type pains in mid thoracic spine area, increased by leaning forward and getting more frequent. Possibly related to her hernia. No change dyspnea with exertion, no fever, cough, night sweats, palpitation or nodes.  01/18/13- 66 yoF never smoker followed for sarcoid, complicated by dyspnea, hiatal hernia/ GERD risk follows for:  breathing is about the same as last ov.  discuss her CPAP machine.  pt feels that she does not need this machine.  She has no significant shortness of breath now after hiatal hernia reduction surgery. She continues CPAP with autotitration/ APS, but isn't sure she needs it.  07/22/13- 66 yoF never smoker followed for sarcoid, complicated by dyspnea,  Repaired hiatal hernia/ GERD risk follows for:  breathing is about the same as last ov.  discuss her CPAP auto/ APS machine O2 2L.  pt feels that she does not need this machine.  She has been skipping CPAP and claims to feel fine without it. Mother is in the house-uncertain reporter.  Review of Systems-see HPI Constitutional:   No-    weight loss, night sweats, fevers, chills, fatigue, lassitude. overweight HEENT:   No-  headaches, difficulty swallowing, tooth/dental problems, sore throat,       No-  sneezing, itching, ear ache, nasal congestion, post nasal drip,  CV:  No-   chest pain, orthopnea, PND, swelling in lower extremities, anasarca,  dizziness, palpitations Resp: +  shortness of breath with exertion or at rest.              No-   productive cough,  No-non-productive cough,  No-  coughing up of blood.              No-   change in color of mucus.  No- wheezing.   Skin: No-   rash or lesions. GI:  No-   heartburn, indigestion, abdominal pain, nausea, vomiting, ,  + bowels gurgle and loose" GU:  MS:  No-   joint pain or swelling.  Neuro- nothing unusual:  Psych:  No- change in mood or affect. No depression or anxiety.  No memory loss.  Objective:   Physical Exam General- Alert, Oriented, Affect-, and friendly, Distress- none acute, overweight Skin- rash-none, lesions- none, excoriation- none Lymphadenopathy- none Head- atraumatic  Eyes- Gross vision intact, PERRLA, conjunctivae clear secretions            Ears- Hearing, canals-normal            Nose- Clear, no-Septal dev, mucus, polyps, erosion, perforation             Throat- Mallampati III , mucosa clear , drainage- none, tonsils- atrophic Neck- flexible , trachea midline, no stridor , thyroid nl, carotid no bruit Chest - symmetrical excursion , unlabored           Heart/CV- RRR , no murmur , no gallop  , no rub, nl s1 s2                           - JVD+ 1 CM , edema- none, stasis changes- none, varices- none           Lung- clear to P&A, wheeze- none, cough- none , dullness-none, rub- none           Chest wall-  Abd-  Br/ Gen/ Rectal- Not done, not indicated Extrem- cyanosis- none, clubbing, none, atrophy- none, strength- nl,  Neuro- grossly intact to observation  Assessment & Plan:

## 2013-07-22 NOTE — Patient Instructions (Addendum)
Order- DME APS   ONOX on room air off CPAP     Dx OSA                                Send most recent download for pressure recommendation  It will help if you are able to keep your weight down and avoid sleeping on the flat of your back.

## 2013-08-14 NOTE — Assessment & Plan Note (Signed)
Long term remission 

## 2013-08-14 NOTE — Assessment & Plan Note (Signed)
No complaint

## 2013-08-14 NOTE — Assessment & Plan Note (Signed)
She has gained about 5 pounds since her original sleep study, and has had her hiatal hernia repaired. Understand she would like to be free of her oxygen and her CPAP. Plan-overnight oximetry on room air without CPAP or oxygen. CPAP download for pressure.

## 2013-09-02 ENCOUNTER — Encounter: Payer: Self-pay | Admitting: Internal Medicine

## 2013-09-02 ENCOUNTER — Ambulatory Visit (INDEPENDENT_AMBULATORY_CARE_PROVIDER_SITE_OTHER): Payer: Medicare Other | Admitting: Internal Medicine

## 2013-09-02 VITALS — BP 112/70 | HR 86 | Ht 58.5 in | Wt 180.6 lb

## 2013-09-02 DIAGNOSIS — D869 Sarcoidosis, unspecified: Secondary | ICD-10-CM

## 2013-09-02 DIAGNOSIS — G4733 Obstructive sleep apnea (adult) (pediatric): Secondary | ICD-10-CM

## 2013-09-02 MED ORDER — FLUCONAZOLE 150 MG PO TABS: ORAL_TABLET | ORAL | Status: DC

## 2013-09-02 NOTE — Patient Instructions (Signed)
Order- DME- APS  D/c CPAP, D/C O2     Dx Sarcoid  Please call as needed

## 2013-09-02 NOTE — Progress Notes (Signed)
Patient ID: Rachel Kelly, female    DOB: 1945/09/10, 68 y.o.   MRN: [id number] PCP Dr [name]  HPI 12/27/10- 35 yoF never smoker followed for sarcoid, complicated by dyspnea, hiatal hernia/ GERD risk Last here September 26, 2010- note reviewed. Labs were updated from 12/11 visit. Since March says she has done very well, denying dyspnea, wheeze or night sweats Admits just a little dry cough occasionally- not concerned. No rash or nodes. She has lost some weight and would like to retest oxygen to see if she can stop O2 used now just at night. She did not desat on 6 MWT in December. DLCO was 53% then.   04/28/11-  65 yoF never smoker followed for sarcoid, complicated by dyspnea, hiatal hernia/ GERD risk She considers herself "low risk" and therefore declines flu vaccine.Overnight oximetry on room air 03/05/2011 recorded over 48 minutes with oxygen saturation less than or equal to 88%. We discussed home oxygen for sleep. She or he has home oxygen for sleep from [facility] at 2 L per minute but had hoped to come off. She understands that she should continue. She prefers to sleep on her stomach but has been told she snores. We discussed the possibility of sleep apnea given her body build, as a risk contributing to the oxygen situation. She is alone with no reporter. She is interested in having a sleep study done. She denies cough, wheeze, chest discomfort, fever, enlarged lymph nodes, or rash. PFT 07/08/2010-FEV1 1.22/68%, FEV1/FVC 0.80, small airway flows 56%. Insignificant response to bronchodilator. TLC 65%, DLCO 53%. CXR 06/28/10- CHEST - 2 VIEW  Comparison: 07/11/2009 chest radiograph.  Findings: Again seen is a large hiatal hernia with associated air-  fluid level. Cardiomegaly is stable. Mild prominence of the  inferior right paratracheal soft tissues are stable. There is some  scarring in the right perihilar region. No discrete focal airspace  disease is identified. Left lung base is  partially obscured by the  large hiatal hernia. No acute bony abnormality is identified.  IMPRESSION:  Stable chest radiograph. Mild stable chronic changes, likely  reflective of patient's known sarcoidosis.  Large hiatal hernia with air-fluid level.  Provider: Lelon Huh   01/13/12- 65 yoF never smoker followed for sarcoid, complicated by dyspnea, hiatal hernia/ GERD risk ACUTE VISIT:  Increased SOB-recently gained weight but unsure if this is the cause of SOB; nothing has changed since last visit. Progressive shortness of breath is bothersome for 4 months, mainly with exertion of walking but not lying supine. Denies cough or wheeze, fever, night sweat, rash or adenopathy. 10 pound weight gain without edema. Has been anemic most of her life. Denies cardiac history and reports normal stress test 2 or 3 years ago.  03/15/12- 66 yoF never smoker followed for sarcoid, complicated by dyspnea, hiatal hernia/ GERD risk  Patient states about the same as last visit. Still c/o sob. States her weight is staying  steady.  D-dimer 1.29 led to CT chest: CT chest - 01/14/12-images reviewed with her IMPRESSION:  1. No pulmonary emboli.  2. Huge hiatal hernia which compresses the left lung, the heart,  and the bronchi.  2. Bronchitic changes which could be acute or chronic.  3. Multiple calcified lymph nodes in the mediastinum.  Original Report Authenticated By: Larey Seat, M.D.  Office Spirometry-within normal limits  04/30/12- 29 yoF never smoker followed for sarcoid, complicated by dyspnea, hiatal hernia/ GERD risk.  Still having SOB even at rest. 6MW  never got scheduled. We have reviewed her chest x-ray and CT scan with her and showed her the compression of lung expansion by her huge hiatal hernia. She has been evaluated by Dr. [name] and now plans-hernia repair in January. Her exertional dyspnea at is no worse. Dyspnea is aggravated by bending over as expected. There is little cough and no  acute change. She is using oxygen 2L/ [facility] at night and aware that she snores some but not told that she stops breathing  06/22/12- 66 yoF never smoker followed for sarcoid, complicated by dyspnea, hiatal hernia/ GERD risk FOLLOWS FOR: review 6MW test and Sleep study with patient She had upper endoscopy and is now pending surgical repair of her huge hiatal hernia after Christmas. No new respiratory issues. 6MWT- 06/22/2012-94%, 88%, 98%, 417 m  Significant desaturation with exercise, but good distance walked.  NPSG- 05/26/12- AHI 17.6/ hr  Moderate obstructive sleep apnea, body weight 176 pounds  08/02/12- 66 yoF never smoker followed for sarcoid, complicated by dyspnea, hiatal hernia/ GERD risk FOLLOWS FOR: review 6MW test and Sleep study with patient To see Dr. [name]  planning hiatal hernia repair January 29. Just got CPAP/APS with O2 2 L for sleep. Noting sharp muscles thousand type pains in mid thoracic spine area, increased by leaning forward and getting more frequent. Possibly related to her hernia. No change dyspnea with exertion, no fever, cough, night sweats, palpitation or nodes.  01/18/13- 66 yoF never smoker followed for sarcoid, complicated by dyspnea, hiatal hernia/ GERD risk follows for:  breathing is about the same as last ov.  discuss her CPAP machine.  pt feels that she does not need this machine.  She has no significant shortness of breath now after hiatal hernia reduction surgery. She continues CPAP with autotitration/ APS, but isn't sure she needs it.  07/22/13- 66 yoF never smoker followed for sarcoid, complicated by dyspnea,  Repaired hiatal hernia/ GERD risk follows for:  breathing is about the same as last ov.  discuss her CPAP auto/ APS machine O2 2L.  pt feels that she does not need this machine.  She has been skipping CPAP and claims to feel fine without it. Mother is in the house-uncertain reporter.  09/02/13- 66 yoF never smoker followed for sarcoid,  complicated by dyspnea,  Repaired hiatal hernia/ GERD risk FOLLOWS FOR:  Has not used cpap since LV.  Does not wear 02 at night either.  Has no breathing complaints. She is still paying for her CPAP machine and her oxygen but is using neither. Her mother tells her she does not snore now. She says she feels no difference during the daytime if she uses CPAP or not. She clearly wants to turn it back in. We discussed sleep apnea.  Review of Systems-see HPI Constitutional:   No-   weight loss, night sweats, fevers, chills, fatigue, lassitude. overweight HEENT:   No-  headaches, difficulty swallowing, tooth/dental problems, sore throat,       No-  sneezing, itching, ear ache, nasal congestion, post nasal drip,  CV:  No-   chest pain, orthopnea, PND, swelling in lower extremities, anasarca,  dizziness, palpitations Resp: +  shortness of breath with exertion or at rest.              No-   productive cough,  No-non-productive cough,  No-  coughing up of blood.              No-   change in color of  mucus.  No- wheezing.   Skin: No-   rash or lesions. GI:  No-   heartburn, indigestion, abdominal pain, nausea, vomiting, ,   GU:  MS:  No-   joint pain or swelling.  Neuro- nothing unusual:  Psych:  No- change in mood or affect. No depression or anxiety.  No memory loss.  Objective:   Physical Exam General- Alert, Oriented, Affect-, and friendly, Distress- none acute, overweight Skin- rash-none, lesions- none, excoriation- none Lymphadenopathy- none Head- atraumatic            Eyes- Gross vision intact, PERRLA, conjunctivae clear secretions            Ears- Hearing, canals-normal            Nose- Clear, no-Septal dev, mucus, polyps, erosion, perforation             Throat- Mallampati III , mucosa clear , drainage- none, tonsils- atrophic Neck- flexible , trachea midline, no stridor , thyroid nl, carotid no bruit Chest - symmetrical excursion , unlabored           Heart/CV- RRR , no murmur , no gallop   , no rub, nl s1 s2                           - JVD-none , edema- none, stasis changes- none, varices- none           Lung- clear to P&A, wheeze- none, cough- none , dullness-none, rub- none           Chest wall-  Abd-  Br/ Gen/ Rectal- Not done, not indicated Extrem- cyanosis- none, clubbing, none, atrophy- none, strength- nl,  Neuro- grossly intact to observation  Assessment & Plan:

## 2013-09-27 NOTE — Assessment & Plan Note (Signed)
There is no point in paying for CPAP and oxygen she is not using. Plan-stopped CPAP and oxygen. Will contact her DME company for overnight oximetry download done previously

## 2013-09-27 NOTE — Assessment & Plan Note (Signed)
In long-term remission 

## 2013-10-03 ENCOUNTER — Encounter: Payer: Self-pay | Admitting: Internal Medicine

## 2014-01-05 ENCOUNTER — Other Ambulatory Visit: Payer: Self-pay

## 2014-01-05 DIAGNOSIS — Z1231 Encounter for screening mammogram for malignant neoplasm of breast: Secondary | ICD-10-CM

## 2014-01-27 IMAGING — CT CT ABD-PELV W/ CM
2 of 5 series · 16 of 46 positions shown, 18 images · IV contrast (APPLIED)
Comparison: CT scan of the chest 01/15/2012

CLINICAL DATA: Diffuse abdominal pain

CT ABDOMEN AND PELVIS WITH CONTRAST
TECHNIQUE: Multidetector CT imaging of the abdomen and pelvis was
performed following the standard protocol during bolus
administration of intravenous contrast.
Contrast: 100mL OMNIPAQUE IOHEXOL 300 MG/ML  SOLN

[Series 2: abd/pelv with 5.0 b31f st · axial · 0.76mm/px · z∈[-414,-38]mm · 13 of 85 slices shown, 15 images]
[im 5/85  soft-tissue]
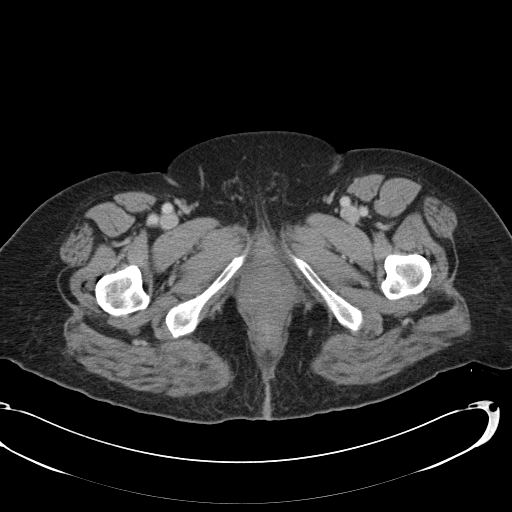
[im 5/85  bone]
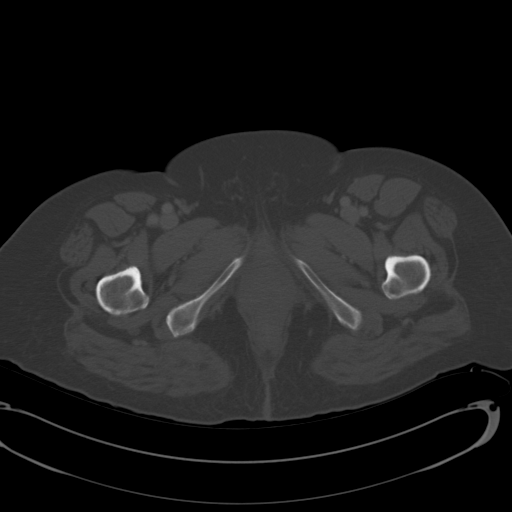
[im 14/85  soft-tissue]
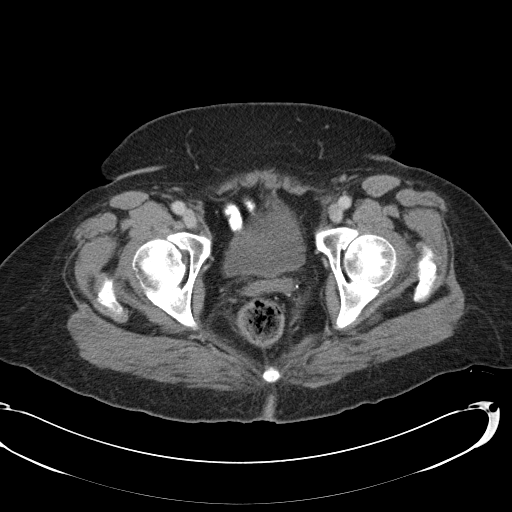
[im 18/85  soft-tissue]
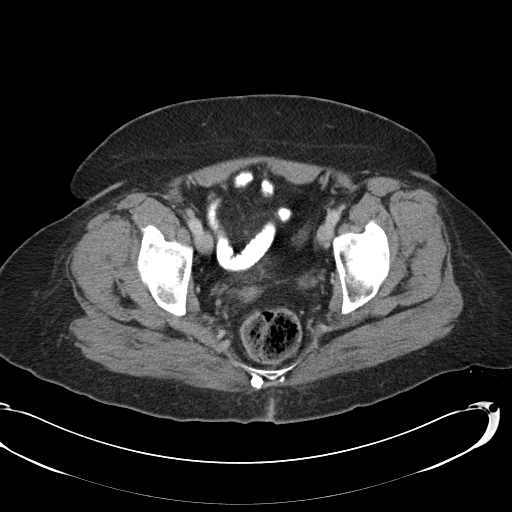
[im 23/85  soft-tissue]
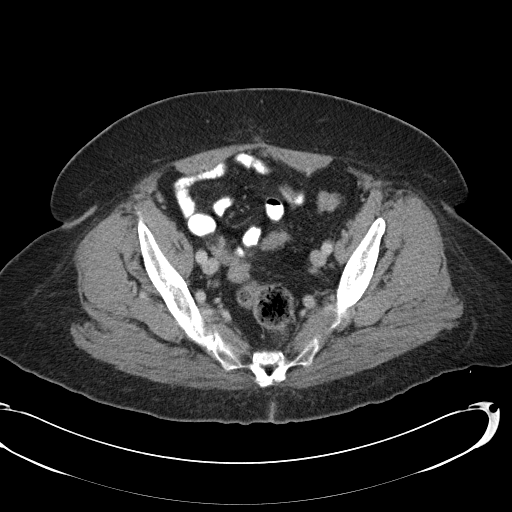
[im 31/85  soft-tissue]
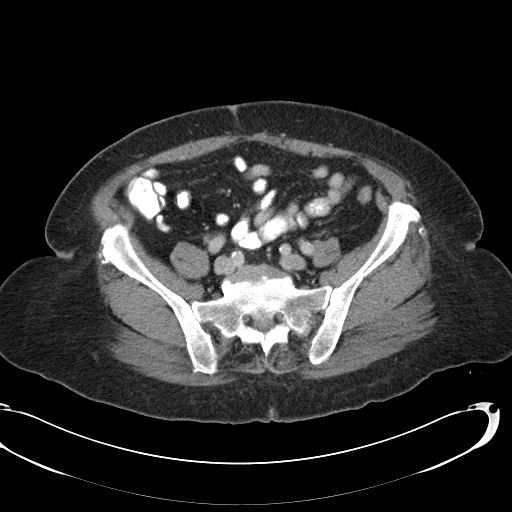
[im 36/85  soft-tissue]
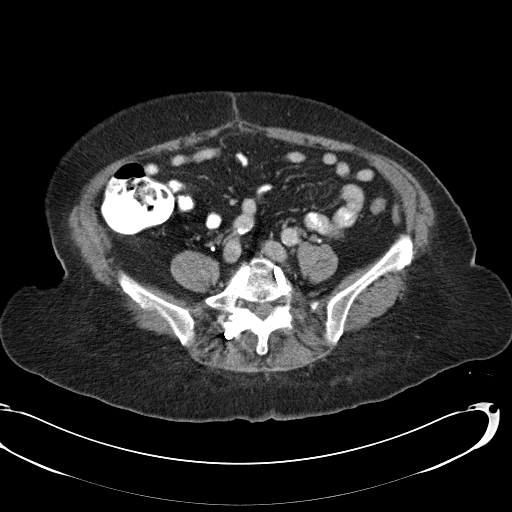
[im 45/85  soft-tissue]
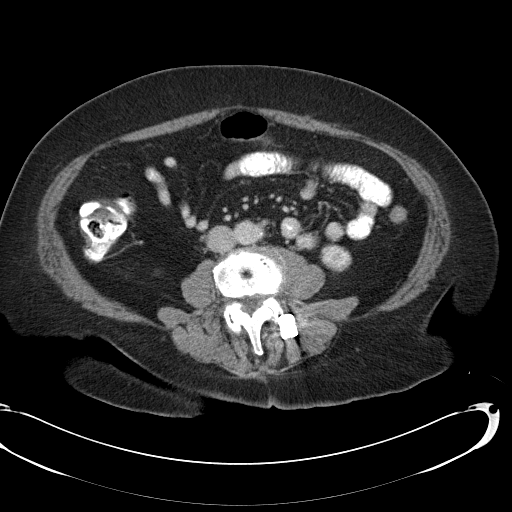
[im 49/85  soft-tissue]
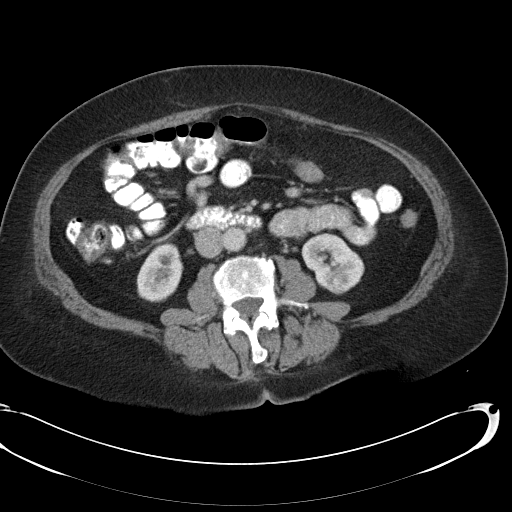
[im 54/85  soft-tissue]
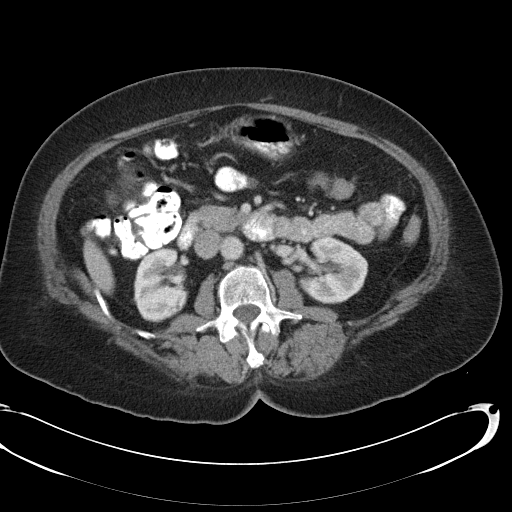
[im 54/85  bone]
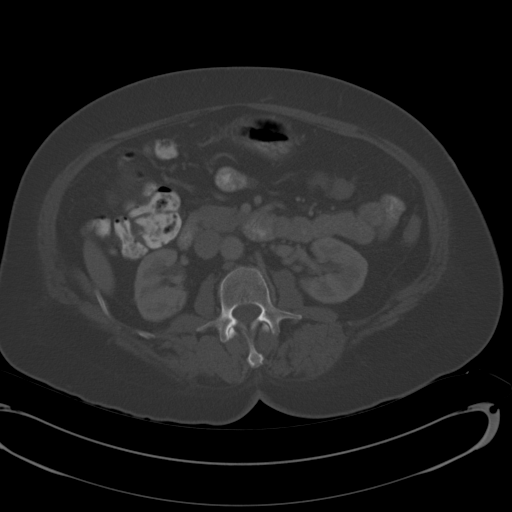
[im 62/85  soft-tissue]
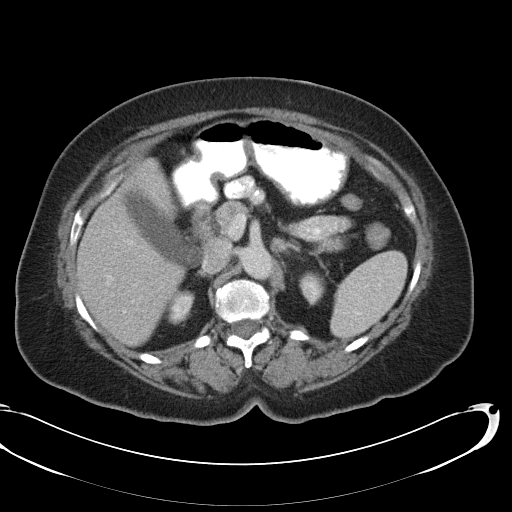
[im 67/85  soft-tissue]
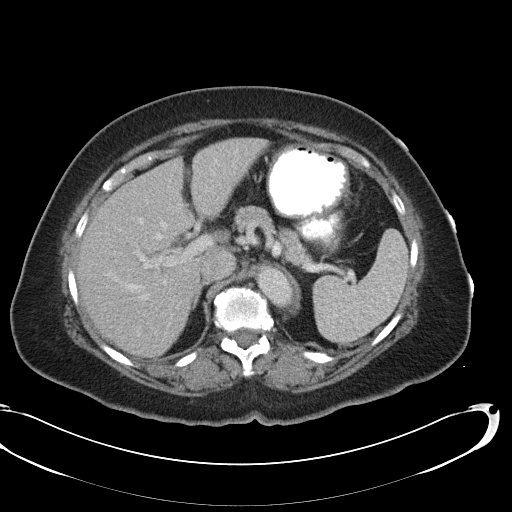
[im 71/85  soft-tissue]
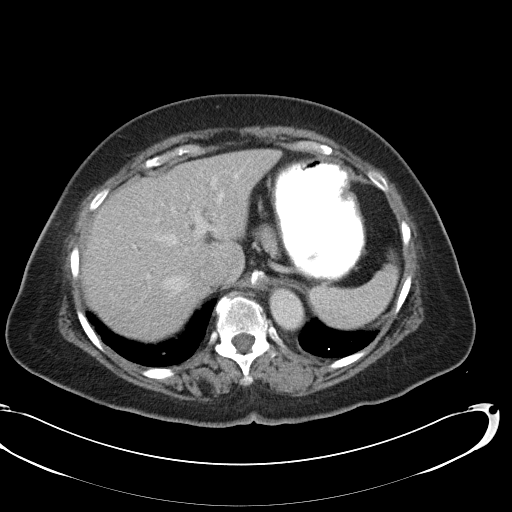
[im 80/85  soft-tissue]
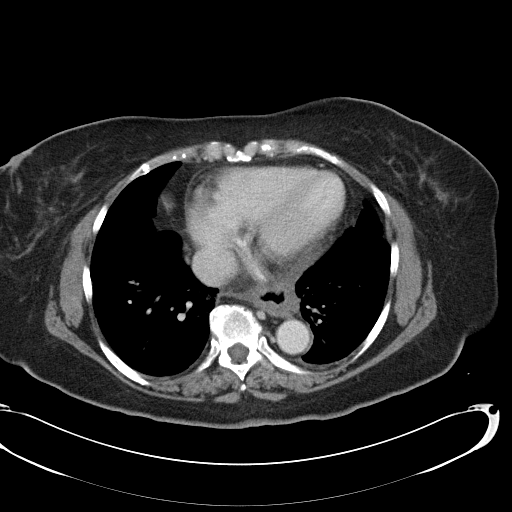

[Series 5: coronals · coronal · 0.85mm/px · 3 of 106 slices shown]
[im 36/106  soft-tissue]
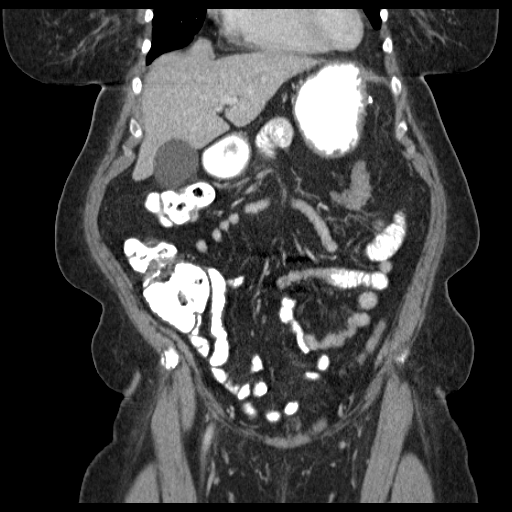
[im 47/106  soft-tissue]
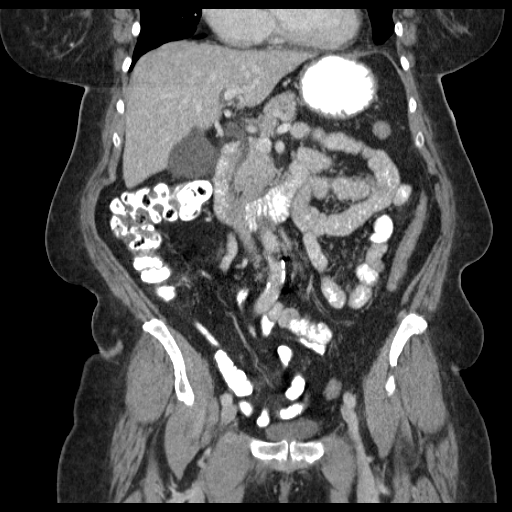
[im 59/106  soft-tissue]
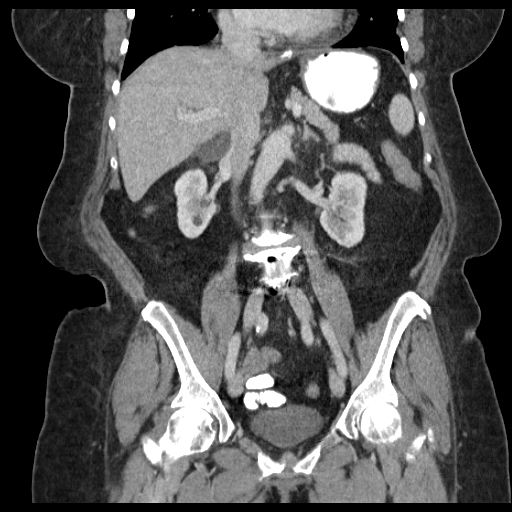

[16 of 46 positions shown; findings below may reference images not displayed]

FINDINGS: Lung bases are unremarkable.  The patient is status post hiatal
hernia repair.  There is thickening of the distal esophageal wall.
Although this may be postsurgical in nature clinical correlation is
necessary.  This is best seen on axial image six.  Mild distended
gallbladder without evidence of calcified gallstones.

Small amount of contrast material probable reflux noted in distal
esophagus.

Sagittal images of the spine shows postsurgical changes at L4-L5
level.  Degenerative changes are noted at L3-L4 level.  The liver,
spleen, pancreas and adrenals are unremarkable.  Enhanced kidneys
are symmetrical in size.  No hydronephrosis or hydroureter.  There
is a high density exophytic lesion in the mid pole of the right
kidney measures 7 mm.  A solid lesion in the noted in the lower
pole posterior aspect of the left kidney measures 1.9 cm.  This are
highly suspicious for renal cell carcinoma.  Further evaluation
with enhanced MRI is recommended.

No aortic aneurysm.  No small bowel obstruction.  No ascites or
free air.  No adenopathy.  There is no pericecal inflammation.  The
terminal ileum is unremarkable.

The patient is status post hysterectomy.  The urinary bladder is
unremarkable.  No destructive bony lesions are noted within pelvis.
Bilateral renal there is symmetrical excretion.  Bilateral
visualized proximal ureter is unremarkable.
IMPRESSION: 1.  The patient is status post hiatal hernia repair.  There is
thickening of distal esophageal wall.  Although this may be
postsurgical in nature gastroesophageal reflux disease cannot be
excluded.  Clinical correlation is necessary.
2.  No hydronephrosis or hydroureter.  Bilateral renal lesions are
noted.. High density exophytic lesion mid pole of the right kidney
measures 7 mm.  Solid lesion in the lower pole of the left kidney
posteriorly measures 1.9 cm.  These are suspicious for renal cell
carcinoma.  Further evaluation with enhanced MRI is recommended.
3.  Bilateral renal symmetrical excretion.

4.  Postsurgical changes lumbar spine.
5.  Status post hysterectomy.

## 2014-02-14 ENCOUNTER — Encounter (INDEPENDENT_AMBULATORY_CARE_PROVIDER_SITE_OTHER): Payer: Self-pay

## 2014-02-14 ENCOUNTER — Ambulatory Visit
Admission: RE | Admit: 2014-02-14 | Discharge: 2014-02-14 | Disposition: A | Discharge status: Home / Self Care | Payer: Medicare Other | Source: Ambulatory Visit

## 2014-02-14 DIAGNOSIS — Z1231 Encounter for screening mammogram for malignant neoplasm of breast: Secondary | ICD-10-CM

## 2014-08-15 ENCOUNTER — Encounter: Payer: Self-pay | Admitting: Internal Medicine

## 2014-08-15 ENCOUNTER — Ambulatory Visit (INDEPENDENT_AMBULATORY_CARE_PROVIDER_SITE_OTHER): Payer: Medicare Other | Admitting: Internal Medicine

## 2014-08-15 VITALS — BP 110/84 | HR 67 | Ht <= 58 in | Wt 179.4 lb

## 2014-08-15 DIAGNOSIS — K219 Gastro-esophageal reflux disease without esophagitis: Secondary | ICD-10-CM

## 2014-08-15 DIAGNOSIS — G4733 Obstructive sleep apnea (adult) (pediatric): Secondary | ICD-10-CM

## 2014-08-15 DIAGNOSIS — D869 Sarcoidosis, unspecified: Secondary | ICD-10-CM

## 2014-08-15 NOTE — Assessment & Plan Note (Signed)
Aware of some heartburn when she is supine. Complains of phlegm without cough, wheeze or airway rattle. I  suspect what she is feeling is some mucus coming up from her esophagus or bland reflux. Plan-continue Nexium in the morning but add Pepcid before supper for trial

## 2014-08-15 NOTE — Assessment & Plan Note (Signed)
Long-term clinical remission

## 2014-08-15 NOTE — Progress Notes (Signed)
Patient ID: Rachel Kelly, female    DOB: 1945/09/10, 69 y.o.   MRN: [id number] PCP Dr [name]  HPI 12/27/10- 35 yoF never smoker followed for sarcoid, complicated by dyspnea, hiatal hernia/ GERD risk Last here September 26, 2010- note reviewed. Labs were updated from 12/11 visit. Since March says she has done very well, denying dyspnea, wheeze or night sweats Admits just a little dry cough occasionally- not concerned. No rash or nodes. She has lost some weight and would like to retest oxygen to see if she can stop O2 used now just at night. She did not desat on 6 MWT in December. DLCO was 53% then.   04/28/11-  65 yoF never smoker followed for sarcoid, complicated by dyspnea, hiatal hernia/ GERD risk She considers herself "low risk" and therefore declines flu vaccine.Overnight oximetry on room air 03/05/2011 recorded over 48 minutes with oxygen saturation less than or equal to 88%. We discussed home oxygen for sleep. She or he has home oxygen for sleep from [facility] at 2 L per minute but had hoped to come off. She understands that she should continue. She prefers to sleep on her stomach but has been told she snores. We discussed the possibility of sleep apnea given her body build, as a risk contributing to the oxygen situation. She is alone with no reporter. She is interested in having a sleep study done. She denies cough, wheeze, chest discomfort, fever, enlarged lymph nodes, or rash. PFT 07/08/2010-FEV1 1.22/68%, FEV1/FVC 0.80, small airway flows 56%. Insignificant response to bronchodilator. TLC 65%, DLCO 53%. CXR 06/28/10- CHEST - 2 VIEW  Comparison: 07/11/2009 chest radiograph.  Findings: Again seen is a large hiatal hernia with associated air-  fluid level. Cardiomegaly is stable. Mild prominence of the  inferior right paratracheal soft tissues are stable. There is some  scarring in the right perihilar region. No discrete focal airspace  disease is identified. Left lung base is  partially obscured by the  large hiatal hernia. No acute bony abnormality is identified.  IMPRESSION:  Stable chest radiograph. Mild stable chronic changes, likely  reflective of patient's known sarcoidosis.  Large hiatal hernia with air-fluid level.  Provider: Lelon Huh   01/13/12- 65 yoF never smoker followed for sarcoid, complicated by dyspnea, hiatal hernia/ GERD risk ACUTE VISIT:  Increased SOB-recently gained weight but unsure if this is the cause of SOB; nothing has changed since last visit. Progressive shortness of breath is bothersome for 4 months, mainly with exertion of walking but not lying supine. Denies cough or wheeze, fever, night sweat, rash or adenopathy. 10 pound weight gain without edema. Has been anemic most of her life. Denies cardiac history and reports normal stress test 2 or 3 years ago.  03/15/12- 66 yoF never smoker followed for sarcoid, complicated by dyspnea, hiatal hernia/ GERD risk  Patient states about the same as last visit. Still c/o sob. States her weight is staying  steady.  D-dimer 1.29 led to CT chest: CT chest - 01/14/12-images reviewed with her IMPRESSION:  1. No pulmonary emboli.  2. Huge hiatal hernia which compresses the left lung, the heart,  and the bronchi.  2. Bronchitic changes which could be acute or chronic.  3. Multiple calcified lymph nodes in the mediastinum.  Original Report Authenticated By: Larey Seat, M.D.  Office Spirometry-within normal limits  04/30/12- 29 yoF never smoker followed for sarcoid, complicated by dyspnea, hiatal hernia/ GERD risk.  Still having SOB even at rest. 6MW  never got scheduled. We have reviewed her chest x-ray and CT scan with her and showed her the compression of lung expansion by her huge hiatal hernia. She has been evaluated by Dr. [name] and now plans-hernia repair in January. Her exertional dyspnea at is no worse. Dyspnea is aggravated by bending over as expected. There is little cough and no  acute change. She is using oxygen 2L/ [facility] at night and aware that she snores some but not told that she stops breathing  06/22/12- 66 yoF never smoker followed for sarcoid, complicated by dyspnea, hiatal hernia/ GERD risk FOLLOWS FOR: review 6MW test and Sleep study with patient She had upper endoscopy and is now pending surgical repair of her huge hiatal hernia after Christmas. No new respiratory issues. 6MWT- 06/22/2012-94%, 88%, 98%, 417 m  Significant desaturation with exercise, but good distance walked.  NPSG- 05/26/12- AHI 17.6/ hr  Moderate obstructive sleep apnea, body weight 176 pounds  08/02/12- 66 yoF never smoker followed for sarcoid, complicated by dyspnea, hiatal hernia/ GERD risk FOLLOWS FOR: review 6MW test and Sleep study with patient To see Dr. [name]  planning hiatal hernia repair January 29. Just got CPAP/APS with O2 2 L for sleep. Noting sharp muscles thousand type pains in mid thoracic spine area, increased by leaning forward and getting more frequent. Possibly related to her hernia. No change dyspnea with exertion, no fever, cough, night sweats, palpitation or nodes.  01/18/13- 66 yoF never smoker followed for sarcoid, complicated by dyspnea, hiatal hernia/ GERD risk follows for:  breathing is about the same as last ov.  discuss her CPAP machine.  pt feels that she does not need this machine.  She has no significant shortness of breath now after hiatal hernia reduction surgery. She continues CPAP with autotitration/ APS, but isn't sure she needs it.  07/22/13- 66 yoF never smoker followed for sarcoid, complicated by dyspnea,  Repaired hiatal hernia/ GERD risk follows for:  breathing is about the same as last ov.  discuss her CPAP auto/ APS machine O2 2L.  pt feels that she does not need this machine.  She has been skipping CPAP and claims to feel fine without it. Mother is in the house-uncertain reporter.  09/02/13- 66 yoF never smoker followed for sarcoid,  complicated by dyspnea,  Repaired hiatal hernia/ GERD risk FOLLOWS FOR:  Has not used cpap since LV.  Does not wear 02 at night either.  Has no breathing complaints. She is still paying for her CPAP machine and her oxygen but is using neither. Her mother tells her she does not snore now. She says she feels no difference during the daytime if she uses CPAP or not. She clearly wants to turn it back in. We discussed sleep apnea.  08/15/14- 68 yoF never smoker followed for sarcoid, complicated by dyspnea,  Repaired hiatal hernia/ GERD risk  FOLLOW FOR:  OSA; pt says she has a lot of phlegm in her chest. pt does not use CPAP or Oxygen- turned back in last year Aware of some heartburn when she lies down, despite Nexium in AMs Little cough or postnasal drip. She describes awareness of mucus appearing in her throat. She looked up phlegm through Google and learned that it was a respiratory issue but she does not recognize chest rattle, cough or wheeze, postnasal drainage or other airway problem now.   Review of Systems-see HPI Constitutional:   No-   weight loss, night sweats, fevers, chills, fatigue, lassitude. overweight HEENT:  No-  headaches, difficulty swallowing, tooth/dental problems, sore throat,       No-  sneezing, itching, ear ache, nasal congestion, post nasal drip,  CV:  No-   chest pain, orthopnea, PND, swelling in lower extremities, anasarca,  dizziness, palpitations Resp: +  shortness of breath with exertion or at rest.              No-   productive cough,  No-non-productive cough,  No-  coughing up of blood.              No-   change in color of mucus.  No- wheezing.   Skin: No-   rash or lesions. GI:  +heartburn, indigestion, abdominal pain, nausea, vomiting, ,   GU:  MS:  No-   joint pain or swelling.  Neuro- nothing unusual:  Psych:  No- change in mood or affect. No depression or anxiety.  No memory loss.  Objective:   Physical Exam General- Alert, Oriented, Affect- friendly,  Distress- none acute, overweight Skin- rash-none, lesions- none, excoriation- none Lymphadenopathy- none Head- atraumatic            Eyes- Gross vision intact, PERRLA, conjunctivae clear secretions            Ears- Hearing, canals-normal            Nose- Clear, no-Septal dev, mucus, polyps, erosion, perforation             Throat- Mallampati III , mucosa clear , drainage- none, tonsils- atrophic Neck- flexible , trachea midline, no stridor , thyroid nl, carotid no bruit Chest - symmetrical excursion , unlabored           Heart/CV- RRR , no murmur , no gallop  , no rub, nl s1 s2                           - JVD-none , edema- none, stasis changes- none, varices- none           Lung- clear to P&A, wheeze- none, cough- none , dullness-none, rub- none           Chest wall-  Abd-  Br/ Gen/ Rectal- Not done, not indicated Extrem- cyanosis- none, clubbing, none, atrophy- none, strength- nl,  Neuro- grossly intact to observation  Assessment & Plan:

## 2014-08-15 NOTE — Assessment & Plan Note (Signed)
She has denied symptoms and return back in her oxygen and her CPAP, feeling stable and comfortable. She feels this issue is resolved following final resolution of her hiatal hernia surgery.

## 2014-08-15 NOTE — Patient Instructions (Signed)
Continue Nexium before breakfast daily  Try adding otc Pepcid/ famotidine 1 daily before supper, for additional acid control. Try this for a couple of weeks at least. See if it gradually reduces the amount of phlegm you bring up.

## 2015-01-16 ENCOUNTER — Other Ambulatory Visit: Payer: Self-pay

## 2015-01-16 DIAGNOSIS — Z1231 Encounter for screening mammogram for malignant neoplasm of breast: Secondary | ICD-10-CM

## 2015-02-26 ENCOUNTER — Ambulatory Visit
Admission: RE | Admit: 2015-02-26 | Discharge: 2015-02-26 | Disposition: A | Discharge status: Home / Self Care | Payer: Medicare Other | Source: Ambulatory Visit

## 2015-02-26 DIAGNOSIS — Z1231 Encounter for screening mammogram for malignant neoplasm of breast: Secondary | ICD-10-CM

## 2015-08-16 ENCOUNTER — Ambulatory Visit (INDEPENDENT_AMBULATORY_CARE_PROVIDER_SITE_OTHER): Payer: 59 | Admitting: Internal Medicine

## 2015-08-16 ENCOUNTER — Encounter: Payer: Self-pay | Admitting: Internal Medicine

## 2015-08-16 VITALS — BP 128/76 | HR 95 | Ht <= 58 in | Wt 179.2 lb

## 2015-08-16 DIAGNOSIS — G4733 Obstructive sleep apnea (adult) (pediatric): Secondary | ICD-10-CM

## 2015-08-16 DIAGNOSIS — D869 Sarcoidosis, unspecified: Secondary | ICD-10-CM | POA: Diagnosis not present

## 2015-08-16 NOTE — Progress Notes (Signed)
Patient ID: Rachel Kelly, female    DOB: 1945/09/10, 70 y.o.   MRN: [id number] PCP Dr [name]  HPI 12/27/10- 35 yoF never smoker followed for sarcoid, complicated by dyspnea, hiatal hernia/ GERD risk Last here September 26, 2010- note reviewed. Labs were updated from 12/11 visit. Since March says she has done very well, denying dyspnea, wheeze or night sweats Admits just a little dry cough occasionally- not concerned. No rash or nodes. She has lost some weight and would like to retest oxygen to see if she can stop O2 used now just at night. She did not desat on 6 MWT in December. DLCO was 53% then.   04/28/11-  65 yoF never smoker followed for sarcoid, complicated by dyspnea, hiatal hernia/ GERD risk She considers herself "low risk" and therefore declines flu vaccine.Overnight oximetry on room air 03/05/2011 recorded over 48 minutes with oxygen saturation less than or equal to 88%. We discussed home oxygen for sleep. She or he has home oxygen for sleep from [facility] at 2 L per minute but had hoped to come off. She understands that she should continue. She prefers to sleep on her stomach but has been told she snores. We discussed the possibility of sleep apnea given her body build, as a risk contributing to the oxygen situation. She is alone with no reporter. She is interested in having a sleep study done. She denies cough, wheeze, chest discomfort, fever, enlarged lymph nodes, or rash. PFT 07/08/2010-FEV1 1.22/68%, FEV1/FVC 0.80, small airway flows 56%. Insignificant response to bronchodilator. TLC 65%, DLCO 53%. CXR 06/28/10- CHEST - 2 VIEW  Comparison: 07/11/2009 chest radiograph.  Findings: Again seen is a large hiatal hernia with associated air-  fluid level. Cardiomegaly is stable. Mild prominence of the  inferior right paratracheal soft tissues are stable. There is some  scarring in the right perihilar region. No discrete focal airspace  disease is identified. Left lung base is  partially obscured by the  large hiatal hernia. No acute bony abnormality is identified.  IMPRESSION:  Stable chest radiograph. Mild stable chronic changes, likely  reflective of patient's known sarcoidosis.  Large hiatal hernia with air-fluid level.  Provider: Lelon Huh   01/13/12- 65 yoF never smoker followed for sarcoid, complicated by dyspnea, hiatal hernia/ GERD risk ACUTE VISIT:  Increased SOB-recently gained weight but unsure if this is the cause of SOB; nothing has changed since last visit. Progressive shortness of breath is bothersome for 4 months, mainly with exertion of walking but not lying supine. Denies cough or wheeze, fever, night sweat, rash or adenopathy. 10 pound weight gain without edema. Has been anemic most of her life. Denies cardiac history and reports normal stress test 2 or 3 years ago.  03/15/12- 66 yoF never smoker followed for sarcoid, complicated by dyspnea, hiatal hernia/ GERD risk  Patient states about the same as last visit. Still c/o sob. States her weight is staying  steady.  D-dimer 1.29 led to CT chest: CT chest - 01/14/12-images reviewed with her IMPRESSION:  1. No pulmonary emboli.  2. Huge hiatal hernia which compresses the left lung, the heart,  and the bronchi.  2. Bronchitic changes which could be acute or chronic.  3. Multiple calcified lymph nodes in the mediastinum.  Original Report Authenticated By: Larey Seat, M.D.  Office Spirometry-within normal limits  04/30/12- 29 yoF never smoker followed for sarcoid, complicated by dyspnea, hiatal hernia/ GERD risk.  Still having SOB even at rest. 6MW  never got scheduled. We have reviewed her chest x-ray and CT scan with her and showed her the compression of lung expansion by her huge hiatal hernia. She has been evaluated by Dr. [name] and now plans-hernia repair in January. Her exertional dyspnea at is no worse. Dyspnea is aggravated by bending over as expected. There is little cough and no  acute change. She is using oxygen 2L/ [facility] at night and aware that she snores some but not told that she stops breathing  06/22/12- 66 yoF never smoker followed for sarcoid, complicated by dyspnea, hiatal hernia/ GERD risk FOLLOWS FOR: review 6MW test and Sleep study with patient She had upper endoscopy and is now pending surgical repair of her huge hiatal hernia after Christmas. No new respiratory issues. 6MWT- 06/22/2012-94%, 88%, 98%, 417 m  Significant desaturation with exercise, but good distance walked.  NPSG- 05/26/12- AHI 17.6/ hr  Moderate obstructive sleep apnea, body weight 176 pounds  08/02/12- 66 yoF never smoker followed for sarcoid, complicated by dyspnea, hiatal hernia/ GERD risk FOLLOWS FOR: review 6MW test and Sleep study with patient To see Dr. [name]  planning hiatal hernia repair January 29. Just got CPAP/APS with O2 2 L for sleep. Noting sharp muscles thousand type pains in mid thoracic spine area, increased by leaning forward and getting more frequent. Possibly related to her hernia. No change dyspnea with exertion, no fever, cough, night sweats, palpitation or nodes.  01/18/13- 66 yoF never smoker followed for sarcoid, complicated by dyspnea, hiatal hernia/ GERD risk follows for:  breathing is about the same as last ov.  discuss her CPAP machine.  pt feels that she does not need this machine.  She has no significant shortness of breath now after hiatal hernia reduction surgery. She continues CPAP with autotitration/ APS, but isn't sure she needs it.  07/22/13- 66 yoF never smoker followed for sarcoid, complicated by dyspnea,  Repaired hiatal hernia/ GERD risk follows for:  breathing is about the same as last ov.  discuss her CPAP auto/ APS machine O2 2L.  pt feels that she does not need this machine.  She has been skipping CPAP and claims to feel fine without it. Mother is in the house-uncertain reporter.  09/02/13- 66 yoF never smoker followed for sarcoid,  complicated by dyspnea,  Repaired hiatal hernia/ GERD risk FOLLOWS FOR:  Has not used cpap since LV.  Does not wear 02 at night either.  Has no breathing complaints. She is still paying for her CPAP machine and her oxygen but is using neither. Her mother tells her she does not snore now. She says she feels no difference during the daytime if she uses CPAP or not. She clearly wants to turn it back in. We discussed sleep apnea.  08/15/14- 68 yoF never smoker followed for sarcoid, complicated by dyspnea,  Repaired hiatal hernia/ GERD risk  FOLLOW FOR:  OSA; pt says she has a lot of phlegm in her chest. pt does not use CPAP or Oxygen- turned back in last year Aware of some heartburn when she lies down, despite Nexium in AMs Little cough or postnasal drip. She describes awareness of mucus appearing in her throat. She looked up phlegm through Google and learned that it was a respiratory issue but she does not recognize chest rattle, cough or wheeze, postnasal drainage or other airway problem now.  08/16/2015-70 year old female never smoker followed for sarcoid, complicated by dyspnea, repaired hiatal hernia/GERD risk FOLLOWS FOR:Pt states she has been sleeping well  and breathing has been going good at this time. Reports GERD symptoms are well controlled on medication. Denies cough or dyspnea.  Review of Systems-see HPI Constitutional:   No-   weight loss, night sweats, fevers, chills, fatigue, lassitude. overweight HEENT:   No-  headaches, difficulty swallowing, tooth/dental problems, sore throat,       No-  sneezing, itching, ear ache, nasal congestion, post nasal drip,  CV:  No-   chest pain, orthopnea, PND, swelling in lower extremities, anasarca,  dizziness, palpitations Resp: +  shortness of breath with exertion or at rest.              No-   productive cough,  No-non-productive cough,  No-  coughing up of blood.              No-   change in color of mucus.  No- wheezing.   Skin: No-   rash or  lesions. GI:  +heartburn, indigestion, abdominal pain, nausea, vomiting, ,   GU:  MS:  No-   joint pain or swelling.  Neuro- nothing unusual:  Psych:  No- change in mood or affect. No depression or anxiety.  No memory loss.  Objective:   Physical Exam General- Alert, Oriented, Affect- friendly, Distress- none acute, overweight Skin- rash-none, lesions- none, excoriation- none Lymphadenopathy- none Head- atraumatic            Eyes- Gross vision intact, PERRLA, conjunctivae clear secretions            Ears- Hearing, canals-normal            Nose- Clear, no-Septal dev, mucus, polyps, erosion, perforation             Throat- Mallampati III , mucosa clear , drainage- none, tonsils- atrophic Neck- flexible , trachea midline, no stridor , thyroid nl, carotid no bruit Chest - symmetrical excursion , unlabored           Heart/CV- RRR , no murmur , no gallop  , no rub, nl s1 s2                           - JVD-none , edema- none, stasis changes- none, varices- none           Lung- clear to P&A, wheeze- none, cough- none , dullness-none, rub- none           Chest wall-  Abd-  Br/ Gen/ Rectal- Not done, not indicated Extrem- cyanosis- none, clubbing, none, atrophy- none, strength- nl,  Neuro- grossly intact to observation  Assessment & Plan:

## 2015-08-16 NOTE — Patient Instructions (Signed)
Glad you are doing so well. Please call if we can help 

## 2015-08-18 NOTE — Assessment & Plan Note (Signed)
Clinical remission 

## 2015-08-18 NOTE — Assessment & Plan Note (Signed)
She stopped using CPAP and oxygen after repair of a very large hiatal hernia

## 2015-12-07 ENCOUNTER — Other Ambulatory Visit: Payer: Self-pay | Admitting: General Surgery

## 2015-12-07 DIAGNOSIS — K5900 Constipation, unspecified: Secondary | ICD-10-CM

## 2015-12-13 ENCOUNTER — Other Ambulatory Visit: Payer: Self-pay | Admitting: General Surgery

## 2015-12-13 ENCOUNTER — Ambulatory Visit
Admission: RE | Admit: 2015-12-13 | Discharge: 2015-12-13 | Disposition: A | Discharge status: Home / Self Care | Payer: Medicare Other | Source: Ambulatory Visit | Attending: General Surgery | Admitting: General Surgery

## 2015-12-13 DIAGNOSIS — K5900 Constipation, unspecified: Secondary | ICD-10-CM

## 2016-05-07 ENCOUNTER — Other Ambulatory Visit: Payer: Self-pay | Admitting: Family Medicine

## 2016-05-07 ENCOUNTER — Other Ambulatory Visit: Payer: Self-pay | Admitting: *Deleted

## 2016-05-07 DIAGNOSIS — Z1231 Encounter for screening mammogram for malignant neoplasm of breast: Secondary | ICD-10-CM

## 2016-05-15 ENCOUNTER — Ambulatory Visit
Admission: RE | Admit: 2016-05-15 | Discharge: 2016-05-15 | Disposition: A | Discharge status: Home / Self Care | Payer: Medicare Other | Source: Ambulatory Visit | Attending: Family Medicine | Admitting: Family Medicine

## 2016-05-15 DIAGNOSIS — Z1231 Encounter for screening mammogram for malignant neoplasm of breast: Secondary | ICD-10-CM

## 2016-07-02 ENCOUNTER — Other Ambulatory Visit: Payer: Self-pay | Admitting: Pharmacist

## 2016-07-02 NOTE — Patient Outreach (Signed)
Outreach call to Rachel Kelly regarding her request for follow up from the [facility] Medication Adherence Campaign. Called and spoke with patient. HIPAA identifiers verified and verbal consent received.  [name] reports that she is taking simvastatin 20 mg, but that she skips a dose about every other day. Patient denies barriers to taking the medication such as cost, side effects or memory. Reports that she misses her doses because she does not want to take the medication more frequently. When asked about her reason for not wanting to take the medication daily, patient's reports "I haven't put that much thought into it". Discuss with patient the importance of cholesterol lowering and medication adherence. Patient denies any medication questions or concerns.  Harlow Asa, PharmD, [facility] [phone]

## 2016-08-15 ENCOUNTER — Ambulatory Visit
Admission: RE | Admit: 2016-08-15 | Discharge: 2016-08-15 | Disposition: A | Discharge status: Home / Self Care | Payer: Medicare Other | Source: Ambulatory Visit | Attending: Family Medicine | Admitting: Family Medicine

## 2016-08-15 ENCOUNTER — Other Ambulatory Visit: Payer: Self-pay | Admitting: Family Medicine

## 2016-08-15 DIAGNOSIS — R52 Pain, unspecified: Secondary | ICD-10-CM

## 2016-09-14 ENCOUNTER — Encounter (HOSPITAL_COMMUNITY): Payer: Self-pay | Admitting: *Deleted

## 2016-09-14 ENCOUNTER — Ambulatory Visit (HOSPITAL_COMMUNITY)
Admission: EM | Admit: 2016-09-14 | Discharge: 2016-09-14 | Disposition: A | Discharge status: Home / Self Care | Payer: Medicare Other | Attending: Family Medicine | Admitting: Family Medicine

## 2016-09-14 DIAGNOSIS — Z77098 Contact with and (suspected) exposure to other hazardous, chiefly nonmedicinal, chemicals: Secondary | ICD-10-CM

## 2016-09-14 NOTE — ED Triage Notes (Signed)
Reports having insect repellant chemical splash up onto face approx 1 hr ago.  Pt washed face with water x 15 min as per instructions.  Had glasses in place, but unsure if any in eye; did flush out eye for few minutes.  Denies any vision changes.

## 2016-09-14 NOTE — ED Provider Notes (Signed)
CSN: [id number]     Arrival date & time 09/14/16  1439 History   First MD Initiated Contact with Patient 09/14/16 1502     Chief Complaint  Patient presents with  . Chemical Exposure   (Consider location/radiation/quality/duration/timing/severity/associated sxs/prior Treatment) 71 yo black female presents to be checked out for spraying insect repellant into her face by accident today. She states that top fell off and the spray went onto her face. She had glasses on and immediately washed her face for 15 minutes. She has no symptoms. She has no eye pain or discharge. No visual changes. No rashes or redness of her face. No wheeze is noted.       Past Medical History:  Diagnosis Date  . Anemia   . Blood transfusion 2005  . Hernia   . HH (hiatus hernia)   . Hyperlipidemia   . Hypertension   . PAH (pulmonary artery hypertension)   . Sarcoidosis ([place])   . Shortness of breath    on exertion  . Sleep apnea    on o2 at night  . Wheezing    Past Surgical History:  Procedure Laterality Date  . ABDOMINAL HYSTERECTOMY  1988  . BACK SURGERY  2005   lower  . ESOPHAGOGASTRODUODENOSCOPY  06/24/2012   Procedure: ESOPHAGOGASTRODUODENOSCOPY (EGD);  Surgeon: [name], MD;  Location: [name];  Service: General;  Laterality: N/A;  . HIATAL HERNIA REPAIR  08/18/2012   Procedure: LAPAROSCOPIC REPAIR OF HIATAL HERNIA;  Surgeon: [name], MD;  Location: [facility];  Service: General;  Laterality: N/A;  Laparoscopic Hiatal Hernia Repair   . INSERTION OF MESH  08/18/2012   Procedure: INSERTION OF MESH;  Surgeon: [name], MD;  Location: [facility];  Service: General;;  . VESICOVAGINAL FISTULA CLOSURE W/ TAH     Family History  Problem Relation Age of Onset  . Clotting disorder Mother     PE  . Heart disease Father   . Cancer Maternal Grandmother     not sure type, but spread to the brain.  . Coronary artery disease    . Cancer      ? type   Social History  Substance  Use Topics  . Smoking status: Never Smoker  . Smokeless tobacco: Never Used  . Alcohol use No   OB History    No data available     Review of Systems  All other systems reviewed and are negative.   Allergies  Penicillins and Skin adhesives [cyanoacrylate]  Home Medications   Prior to Admission medications   Medication Sig Start Date End Date Taking? Authorizing Provider  Carboxymethylcellul-Glycerin (OPTIVE) 0.5-0.9 % SOLN Apply 1 drop to eye 3 (three) times daily as needed. Dry eyes   Yes Historical Provider, MD  Cholecalciferol (VITAMIN D) 2000 UNITS CAPS Take 1 capsule by mouth daily.   Yes Historical Provider, MD  Coenzyme Q10 200 MG TABS Take 200 mg by mouth daily.   Yes Historical Provider, MD  ketotifen (ALAWAY) 0.025 % ophthalmic solution Place 1 drop into both eyes 2 (two) times daily.   Yes Historical Provider, MD  potassium gluconate 595 MG TABS Take 595 mg by mouth daily.    Yes Historical Provider, MD  simvastatin (ZOCOR) 20 MG tablet Take 20 mg by mouth every morning.    Yes Historical Provider, MD  triamterene-hydrochlorothiazide (MAXZIDE-25) 37.5-25 MG per tablet Take 1 tablet by mouth every morning.    Yes Historical Provider, MD  Magnesium 500 MG  TABS Take 500 mg by mouth daily.    Historical Provider, MD  NEXIUM 40 MG capsule  06/17/13   Historical Provider, MD   Meds Ordered and Administered this Visit  Medications - No data to display  BP 112/71   Pulse 81   Temp 97.7 F (36.5 C) (Oral)   Resp 16   SpO2 98%  No data found.   Physical Exam  Constitutional: She is oriented to person, place, and time. She appears well-developed and well-nourished. No distress.  HENT:  Head: Normocephalic and atraumatic.  Mouth/Throat: Oropharynx is clear and moist.  Eyes: EOM are normal. Pupils are equal, round, and reactive to light. Right eye exhibits no discharge. Left eye exhibits no discharge.  No sclera injection or drainage  Pulmonary/Chest: Effort normal and  breath sounds normal.  Neurological: She is alert and oriented to person, place, and time.  Skin: Skin is warm and dry. No rash noted. She is not diaphoretic. No erythema.  Psychiatric: Her behavior is normal.  Nursing note and vitals reviewed.   Urgent Care Course     Procedures (including critical care time)  Labs Review Labs Reviewed - No data to display  Imaging Review No results found.   Visual Acuity Review  Right Eye Distance:   Left Eye Distance:   Bilateral Distance:    Right Eye Near:   Left Eye Near:    Bilateral Near:         MDM   1. Chemical exposure    Patient presents after 1 hour of spraying insect repellant into her face while wearing glasses.  At this time she has no respiratory compromise, no oropharynx changes, no facial rashes/erythema and a normal eye exam. Patient is asymptomatic. Reassurance is given, but certainly follow up if there are emergent concerns.     Bjorn Pippin, PA-C 09/14/16 [phone]

## 2016-09-14 NOTE — Discharge Instructions (Signed)
You did great by washing your skin and glad you had glasses on. Your exam is great today and hopefully you removed all of this. Certainly if rash develops or other symptoms please follow up. Nice to meet you and take care.

## 2017-07-01 ENCOUNTER — Other Ambulatory Visit: Payer: Self-pay | Admitting: Family Medicine

## 2017-07-01 DIAGNOSIS — Z1231 Encounter for screening mammogram for malignant neoplasm of breast: Secondary | ICD-10-CM

## 2017-07-30 ENCOUNTER — Ambulatory Visit: Payer: Medicare Other

## 2018-05-26 ENCOUNTER — Ambulatory Visit
Admission: RE | Admit: 2018-05-26 | Discharge: 2018-05-26 | Disposition: A | Discharge status: Home / Self Care | Payer: Medicare Other | Source: Ambulatory Visit | Attending: Family Medicine | Admitting: Family Medicine

## 2018-05-26 DIAGNOSIS — Z1231 Encounter for screening mammogram for malignant neoplasm of breast: Secondary | ICD-10-CM

## 2018-05-28 ENCOUNTER — Other Ambulatory Visit: Payer: Self-pay | Admitting: Family Medicine

## 2018-05-28 DIAGNOSIS — R928 Other abnormal and inconclusive findings on diagnostic imaging of breast: Secondary | ICD-10-CM

## 2018-06-02 ENCOUNTER — Ambulatory Visit
Admission: RE | Admit: 2018-06-02 | Discharge: 2018-06-02 | Disposition: A | Discharge status: Home / Self Care | Payer: Medicare Other | Source: Ambulatory Visit | Attending: Family Medicine | Admitting: Family Medicine

## 2018-06-02 ENCOUNTER — Other Ambulatory Visit: Payer: Self-pay | Admitting: Family Medicine

## 2018-06-02 DIAGNOSIS — R928 Other abnormal and inconclusive findings on diagnostic imaging of breast: Secondary | ICD-10-CM

## 2018-06-02 DIAGNOSIS — N6310 Unspecified lump in the right breast, unspecified quadrant: Secondary | ICD-10-CM

## 2018-06-02 HISTORY — PX: BREAST BIOPSY: SHX20

## 2018-06-07 ENCOUNTER — Encounter: Payer: Self-pay | Admitting: *Deleted

## 2018-06-07 ENCOUNTER — Telehealth: Payer: Self-pay | Admitting: Oncology

## 2018-06-07 DIAGNOSIS — Z171 Estrogen receptor negative status [ER-]: Principal | ICD-10-CM

## 2018-06-07 DIAGNOSIS — C50411 Malignant neoplasm of upper-outer quadrant of right female breast: Secondary | ICD-10-CM | POA: Insufficient documentation

## 2018-06-07 NOTE — Telephone Encounter (Signed)
Spoke to patient to confirm afternoon [facility] appointment for 11/20, packet mailed to patient

## 2018-06-08 ENCOUNTER — Encounter: Payer: Self-pay | Admitting: Licensed Clinical Social Worker

## 2018-06-08 NOTE — Progress Notes (Signed)
[place]  [phone] [phone]     ID: Rachel Kelly DOB: [birth year]  MR#: [id number]  EKC#:[id number]  Patient Care Team: Rachel Beard, MD as PCP - General (Family Medicine) Rachel Lever, MD as Referring Physician (Pulmonary Disease) Rachel Seltzer, MD as Consulting Physician (General Surgery) Kelly, Rachel Dad, MD as Consulting Physician (Oncology) Rachel Gibson, MD as Attending Physician (Radiation Oncology) Rachel Kelly, DDS as Consulting Physician (Dentistry) OTHER MD:  CHIEF COMPLAINT: Triple negative breast cancer  CURRENT TREATMENT: Awaiting definitive surgery   HISTORY OF CURRENT ILLNESS: Rachel Kelly had routine screening mammography on 05/26/2018 showing a possible abnormality in the right breast. She underwent bilateral diagnostic mammography with tomography and right breast ultrasonography at [facility] on 06/02/2018 showing: breast density category C, suspicious mass at the 10 o'clock position 8 cm from the nipple. It measures 10 x 7 x 6 mm. Just adjacent to this mass, there is an irregular hypoechoic mass with punctate echogenic foci. It measures 9 x 8 x 3 mm. Additional indeterminate mass at the 9 o'clock position 6 cm from the nipple. It measures 6 x 4 x 4 mm. No suspicious right axillary lymphadenopathy.  Accordingly on 06/02/2018 she proceeded to biopsy of two of the right breast areas in question. The pathology from this procedure showed [phone]): at the 10 o'clock position, invasive ductal carcinoma, grade 2, estrogen and progesterone receptor negative, HER-2 equivocal by immunohistochemistry at 2+, but negative by [facility] with a signals ratio of 1.47 and number per cell 2.20.  Biopsy of the right breast lesion at the 9 o'clock position showed a sclerotic lesion with calcifications and negative for carcinoma.  The patient's subsequent history is as detailed below.  INTERVAL HISTORY: Rachel Kelly was evaluated in  the multidisciplinary breast cancer clinic on 06/09/2018 accompanied by her daughter Rachel Kelly and by her good friend, Rachel Kelly. Her case was also presented at the multidisciplinary breast cancer conference on the same day. At that time a preliminary plan was proposed: Definitive surgery, followed by adjuvant chemotherapy and adjuvant radiation   REVIEW OF SYSTEMS: Rachel Kelly is doing well overall. There were no specific symptoms leading to the original mammogram, which was routinely scheduled. The patient denies unusual headaches, visual changes, nausea, vomiting, stiff neck, dizziness, or gait imbalance. There has been no cough, phlegm production, or pleurisy, no chest pain or pressure, and no change in bowel or bladder habits. The patient denies fever, rash, bleeding, unexplained fatigue or unexplained weight loss. A detailed review of systems was otherwise entirely negative.   PAST MEDICAL HISTORY: Past Medical History:  Diagnosis Date  . Anemia   . Blood transfusion 2005  . Hernia   . HH (hiatus hernia)   . Hyperlipidemia   . Hypertension   . PAH (pulmonary artery hypertension) (Rachel Kelly)   . Sarcoidosis   . Shortness of breath    on exertion  . Sleep apnea    on o2 at night  . Wheezing     PAST SURGICAL HISTORY: Past Surgical History:  Procedure Laterality Date  . ABDOMINAL HYSTERECTOMY  1988  . BACK SURGERY  2005   lower  . ESOPHAGOGASTRODUODENOSCOPY  06/24/2012   Procedure: ESOPHAGOGASTRODUODENOSCOPY (EGD);  Surgeon: Rachel Medal, MD;  Location: [name];  Service: General;  Laterality: N/A;  . HIATAL HERNIA REPAIR  08/18/2012   Procedure: LAPAROSCOPIC REPAIR OF HIATAL HERNIA;  Surgeon: Rachel Jolly, MD;  Location: [facility];  Service: General;  Laterality: N/A;  Laparoscopic  Hiatal Hernia Repair   . INSERTION OF MESH  08/18/2012   Procedure: INSERTION OF MESH;  Surgeon: Rachel Jolly, MD;  Location: [facility];  Service: General;;  . VESICOVAGINAL FISTULA CLOSURE W/ TAH       FAMILY HISTORY Family History  Problem Relation Age of Onset  . Clotting disorder Mother        PE  . Heart disease Father   . Cancer Maternal Grandmother        not sure type, but spread to the brain.  . Coronary artery disease Unknown   . Cancer Unknown        ? type   She notes that her father died from heart disease at age 35. Patients' mother lives in a nursing home and is 37 (as of November 2019)-- she has a form of dementia. The patient has 3 brothers and 0 sisters. A maternal aunt had a form of cancer, not clear what it was.  The maternal grandmother had breast cancer, but was never treated because of her old age.  The patient does not think anyone in her family had ovarian cancer, but there are 2 others in her family that had colon cancer.   GYNECOLOGIC HISTORY:  No LMP recorded. Patient has had a hysterectomy. 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 She lives alone. She does not have any pets.  Her daughter Rachel Kelly, 48, lives in [place] and works in Psychologist, educational. No grandchildren.  The patient belongs to [place]: In place, but she can't remember who her power of attorney is (believes it is her daughter).   HEALTH MAINTENANCE: Social History   Tobacco Use  . Smoking status: Never Smoker  . Smokeless tobacco: Never Used  Substance Use Topics  . Alcohol use: No  . Drug use: No     Colonoscopy: Dr. Collene Kelly  PAP: yes, but not sure when  Bone density: No   Allergies  Allergen Reactions  . Penicillins Rash    Fine red bumps that spread all over the body.  . Skin Adhesives [Cyanoacrylate] Itching and Rash    Dermabond    Current Outpatient Medications  Medication Sig Dispense Refill  .  simvastatin (ZOCOR) 20 MG tablet Take 20 mg by mouth every morning.     . triamterene-hydrochlorothiazide (MAXZIDE-25) 37.5-25 MG per tablet Take 1 tablet by mouth every morning.     . Carboxymethylcellul-Glycerin (OPTIVE) 0.5-0.9 % SOLN Apply 1 drop to eye 3 (three) times daily as needed. Dry eyes    . Cholecalciferol (VITAMIN D) 2000 UNITS CAPS Take 1 capsule by mouth daily.    . Coenzyme Q10 200 MG TABS Take 200 mg by mouth daily.    Marland Kitchen ketotifen (ALAWAY) 0.025 % ophthalmic solution Place 1 drop into both eyes 2 (two) times daily.    . Magnesium 500 MG TABS Take 500 mg by mouth daily.    Marland Kitchen NEXIUM 40 MG capsule     . potassium gluconate 595 MG TABS Take 595 mg by mouth daily.      No current facility-administered medications for this visit.     OBJECTIVE: Middle-aged African-American woman in no acute distress  Vitals:   06/09/18 1308  BP: 127/77  Pulse: 77  Resp: 18  Temp: (!) 97.5 F (36.4 C)  SpO2: 98%     Body mass index is 34.3 kg/m.   Wt Readings from Last 3 Encounters:  06/09/18 169 lb 12.8 oz (77 kg)  08/16/15 179 lb 3.2 oz (81.3 kg)  08/15/14 179 lb 6.4 oz (81.4 kg)      ECOG FS:1 - Symptomatic but completely ambulatory  Ocular: Sclerae unicteric, pupils round and equal Lymphatic: No cervical or supraclavicular adenopathy Lungs no rales or rhonchi Heart regular rate and rhythm Abd soft, nontender, positive bowel sounds MSK no focal spinal tenderness, no joint edema Neuro: non-focal, well-oriented, appropriate affect Breasts: The right breast is status post recent biopsy.  Left breast is unremarkable.  Both axillae are benign.   LAB RESULTS:  CMP     Component Value Date/Time   NA 141 06/09/2018 1239   K 4.6 06/09/2018 1239   CL 106 06/09/2018 1239   CO2 28 06/09/2018 1239   GLUCOSE 97 06/09/2018 1239   BUN 17 06/09/2018 1239   CREATININE 1.27 (H) 06/09/2018 1239   CALCIUM 10.0 06/09/2018 1239   PROT 7.7 06/09/2018 1239   ALBUMIN 3.9 06/09/2018 1239     AST 23 06/09/2018 1239   ALT 15 06/09/2018 1239   ALKPHOS 78 06/09/2018 1239   BILITOT 0.6 06/09/2018 1239   GFRNONAA 41 (L) 06/09/2018 1239   GFRAA 47 (L) 06/09/2018 1239    No results found for: TOTALPROTELP, ALBUMINELP, A1GS, A2GS, BETS, BETA2SER, GAMS, MSPIKE, SPEI  No results found for: KPAFRELGTCHN, LAMBDASER, KAPLAMBRATIO  Lab Results  Component Value Date   WBC 5.2 06/09/2018   NEUTROABS 2.7 06/09/2018   HGB 13.0 06/09/2018   HCT 39.7 06/09/2018   MCV 91.5 06/09/2018   PLT 217 06/09/2018    _0 @  No results found for: LABCA2  No components found for: [id number]  No results for input(s): INR in the last 168 hours.  No results found for: LABCA2  No results found for: MIW803  No results found for: OZY248  No results found for: GNO037  No results found for: CA2729  No components found for: HGQUANT  No results found for: CEA1 / No results found for: CEA1   No results found for: AFPTUMOR  No results found for: [place]  No results found for: PSA1  Appointment on 06/09/2018  Component Date Value Ref Range Status  . Sodium 06/09/2018 141  135 - 145 mmol/L Final  . Potassium 06/09/2018 4.6  3.5 - 5.1 mmol/L Final  . Chloride 06/09/2018 106  98 - 111 mmol/L Final  . CO2 06/09/2018 28  22 - 32 mmol/L Final  . Glucose, Bld 06/09/2018 97  70 - 99 mg/dL Final  . BUN 06/09/2018 17  8 - 23 mg/dL Final  . Creatinine 06/09/2018 1.27* 0.44 - 1.00 mg/dL Final  . Calcium 06/09/2018 10.0  8.9 - 10.3 mg/dL Final  . Total Protein 06/09/2018 7.7  6.5 - 8.1 g/dL Final  . Albumin 06/09/2018 3.9  3.5 - 5.0 g/dL Final  . AST 06/09/2018 23  15 - 41 U/L Final  . ALT 06/09/2018 15  0 - 44 U/L Final  . Alkaline Phosphatase 06/09/2018 78  38 - 126 U/L Final  . Total Bilirubin 06/09/2018 0.6  0.3 - 1.2 mg/dL Final  . GFR, Est Non Af Am 06/09/2018 41* >60 mL/min Final  . GFR, Est AFR Am 06/09/2018 47* >60 mL/min Final   Comment: (NOTE) The eGFR has been  calculated using the CKD EPI equation. This calculation has not been validated in all clinical situations. eGFR's persistently <60 mL/min signify possible Chronic Kidney Disease.   Georgiann Hahn gap 06/09/2018 7  5 - 15 Final   Performed at [facility], [place] [address], [place], [place] [address]  . WBC Count 06/09/2018 5.2  4.0 - 10.5 K/uL Final  . RBC 06/09/2018 4.34  3.87 - 5.11 MIL/uL Final  . Hemoglobin 06/09/2018 13.0  12.0 - 15.0 g/dL Final  . HCT 06/09/2018 39.7  36.0 - 46.0 % Final  . MCV 06/09/2018 91.5  80.0 - 100.0 fL Final  . MCH 06/09/2018 30.0  26.0 - 34.0 pg Final  . MCHC 06/09/2018 32.7  30.0 - 36.0 g/dL Final  . RDW 06/09/2018 12.8  11.5 - 15.5 % Final  . Platelet Count 06/09/2018 217  150 - 400 K/uL Final  . nRBC 06/09/2018 0.0  0.0 - 0.2 % Final  . Neutrophils Relative % 06/09/2018 53  % Final  . Neutro Abs 06/09/2018 2.7  1.7 - 7.7 K/uL Final  . Lymphocytes Relative 06/09/2018 37  % Final  . Lymphs Abs 06/09/2018 2.0  0.7 - 4.0 K/uL Final  . Monocytes Relative 06/09/2018 8  % Final  . Monocytes Absolute 06/09/2018 0.4  0.1 - 1.0 K/uL Final  . Eosinophils Relative 06/09/2018 2  % Final  . Eosinophils Absolute 06/09/2018 0.1  0.0 - 0.5 K/uL Final  . Basophils Relative 06/09/2018 0  % Final  . Basophils Absolute 06/09/2018 0.0  0.0 - 0.1 K/uL Final  . Immature Granulocytes 06/09/2018 0  % Final  . Abs Immature Granulocytes 06/09/2018 0.01  0.00 - 0.07 K/uL Final   Performed at [facility], [address] [place], [place] [address]    (this displays the last labs from the last 3 days)  No results found for: TOTALPROTELP, ALBUMINELP, A1GS, A2GS, BETS, BETA2SER, GAMS, MSPIKE, SPEI (this displays SPEP labs)  No results found for: KPAFRELGTCHN, LAMBDASER, KAPLAMBRATIO (kappa/lambda light chains)  No results found for: HGBA, HGBA2QUANT, HGBFQUANT, HGBSQUAN (Hemoglobinopathy evaluation)   No results found for:  LDH  No results found for: IRON, TIBC, IRONPCTSAT (Iron and TIBC)  No results found for: FERRITIN  Urinalysis    Component Value Date/Time   COLORURINE YELLOW 11/21/2012 1402   APPEARANCEUR CLEAR 11/21/2012 1402   LABSPEC 1.025 11/21/2012 1402   PHURINE 5.0 11/21/2012 1402   GLUCOSEU NEGATIVE 11/21/2012 1402   HGBUR TRACE (A) 11/21/2012 1402   BILIRUBINUR NEGATIVE 11/21/2012 1402   KETONESUR NEGATIVE 11/21/2012 1402   PROTEINUR NEGATIVE 11/21/2012 1402   UROBILINOGEN 1.0 11/21/2012 1402   NITRITE NEGATIVE 11/21/2012 1402   LEUKOCYTESUR NEGATIVE 11/21/2012 1402     STUDIES:  US Breast Ltd Uni Right Inc Axilla  Result Date: 06/02/2018 CLINICAL DATA:  72 year old female recalled from screening mammogram dated 05/26/2018 for possible right breast mass with calcifications and asymmetry. EXAM: DIGITAL DIAGNOSTIC RIGHT MAMMOGRAM WITH CAD AND TOMO ULTRASOUND RIGHT BREAST COMPARISON:  Previous exam(s). ACR Breast Density Category c: The breast tissue is heterogeneously dense, which may obscure small masses. FINDINGS: There is a persistent asymmetry with associated coarse heterogeneous calcifications in the lower outer aspect of the right breast at middle depth. An additional spiculated mass persists in the lateral right breast at middle depth, best seen on the CC projection. This localizes centrally on tomosynthesis. Further evaluation of both areas was performed with ultrasound. Mammographic images were processed with CAD. Targeted  ultrasound is performed, showing an irregular hypoechoic mass at the 10 o'clock position 8 cm from the nipple. It measures 10 x 7 x 6 mm. There is no definite associated vascularity. Just adjacent to this mass, there is an irregular hypoechoic mass with punctate echogenic foci. It measures 9 x 8 x 3 mm. An additional hypoechoic mass with echogenic foci is demonstrated at the 9 o'clock position 6 cm from the nipple. It measures 6 x 4 x 4 mm. There is suggestion of  associated vascularity. Evaluation of the right axilla demonstrates no suspicious lymphadenopathy. IMPRESSION: 1. Suspicious mass at the 10 o'clock position 8 cm from the nipple scratch. Recommendation is for ultrasound-guided biopsy. 2. Additional indeterminate mass at the 9 o'clock position 6 cm from the nipple. Recommendation is for ultrasound-guided biopsy. 3. No suspicious right axillary lymphadenopathy. RECOMMENDATION: Two area ultrasound-guided biopsy of the right breast. This was performed to follow. Please see additional dictation. I have discussed the findings and recommendations with the patient. Results were also provided in writing at the conclusion of the visit. If applicable, a reminder letter will be sent to the patient regarding the next appointment. BI-RADS CATEGORY  4: Suspicious. Electronically Signed   By: Kristopher Oppenheim M.D.   On: 06/02/2018 16:54   Mm Diag Breast Tomo Uni Right  Result Date: 06/02/2018 CLINICAL DATA:  72 year old female recalled from screening mammogram dated 05/26/2018 for possible right breast mass with calcifications and asymmetry. EXAM: DIGITAL DIAGNOSTIC RIGHT MAMMOGRAM WITH CAD AND TOMO ULTRASOUND RIGHT BREAST COMPARISON:  Previous exam(s). ACR Breast Density Category c: The breast tissue is heterogeneously dense, which may obscure small masses. FINDINGS: There is a persistent asymmetry with associated coarse heterogeneous calcifications in the lower outer aspect of the right breast at middle depth. An additional spiculated mass persists in the lateral right breast at middle depth, best seen on the CC projection. This localizes centrally on tomosynthesis. Further evaluation of both areas was performed with ultrasound. Mammographic images were processed with CAD. Targeted ultrasound is performed, showing an irregular hypoechoic mass at the 10 o'clock position 8 cm from the nipple. It measures 10 x 7 x 6 mm. There is no definite associated vascularity. Just adjacent  to this mass, there is an irregular hypoechoic mass with punctate echogenic foci. It measures 9 x 8 x 3 mm. An additional hypoechoic mass with echogenic foci is demonstrated at the 9 o'clock position 6 cm from the nipple. It measures 6 x 4 x 4 mm. There is suggestion of associated vascularity. Evaluation of the right axilla demonstrates no suspicious lymphadenopathy. IMPRESSION: 1. Suspicious mass at the 10 o'clock position 8 cm from the nipple scratch. Recommendation is for ultrasound-guided biopsy. 2. Additional indeterminate mass at the 9 o'clock position 6 cm from the nipple. Recommendation is for ultrasound-guided biopsy. 3. No suspicious right axillary lymphadenopathy. RECOMMENDATION: Two area ultrasound-guided biopsy of the right breast. This was performed to follow. Please see additional dictation. I have discussed the findings and recommendations with the patient. Results were also provided in writing at the conclusion of the visit. If applicable, a reminder letter will be sent to the patient regarding the next appointment. BI-RADS CATEGORY  4: Suspicious. Electronically Signed   By: Kristopher Oppenheim M.D.   On: 06/02/2018 16:54   Mm Screening Breast Tomo Bilateral  Result Date: 05/27/2018 CLINICAL DATA:  Screening. EXAM: DIGITAL SCREENING BILATERAL MAMMOGRAM WITH TOMO AND CAD COMPARISON:  Previous exam(s). ACR Breast Density Category c: The breast tissue is heterogeneously dense,  which may obscure small masses. FINDINGS: In the right breast, a possible mass and calcifications along with adjacent asymmetry warrants further evaluation. In the left breast, no findings suspicious for malignancy. Images were processed with CAD. IMPRESSION: Further evaluation is suggested for possible mass and calcifications along with adjacent asymmetry in the right breast. RECOMMENDATION: Diagnostic mammogram and possibly ultrasound of the right breast. (Code:[id number]) The patient will be contacted regarding the findings, and  additional imaging will be scheduled. BI-RADS CATEGORY  0: Incomplete. Need additional imaging evaluation and/or prior mammograms for comparison. Electronically Signed   By: Lovey Newcomer M.D.   On: 05/27/2018 12:40   Mm Clip Placement Right  Result Date: 06/02/2018 CLINICAL DATA:  Evaluate placement of biopsy clips following 2 ultrasound-guided RIGHT breast biopsies. EXAM: DIAGNOSTIC RIGHT MAMMOGRAM POST ULTRASOUND BIOPSY COMPARISON:  Previous exam(s). FINDINGS: Mammographic images were obtained following ultrasound guided biopsy of the 1 cm mass at the 10 o'clock position of the RIGHT breast and the 0.6 cm mass at the 9 o'clock position of the RIGHT breast. The RIBBON shaped clip is in satisfactory position corresponding to the 1 cm mass biopsied at the 10 o'clock position. The COIL shaped clip lies 7 mm posterior/lateral/superior to the 0.6 cm mass biopsied at the 9 o'clock position. The 2 clips are separated by a distance of 4 cm craniocaudally. The areas biopsied by ultrasound guidance correspond to the findings identified mammographically. IMPRESSION: Satisfactory RIBBON clip position following ultrasound-guided biopsy of the 1 cm UPPER-OUTER RIGHT breast mass. COIL clip position located 7 mm from the 0.6 cm mass biopsied within the OUTER RIGHT breast. Final Assessment: Post Procedure Mammograms for Marker Placement Electronically Signed   By: Margarette Canada M.D.   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 This was found to be concordant by Dr. Hassan Rowan. Pathology revealed BENIGN BREAST TISSUE SHOWING SCLEROTIC LESION WITH CALCIFICATIONS of the RIGHT breast, outer, 9 o'clock position (coil clip). Differential diagnosis can include a  sclerosed papilloma. This was found to be concordant by Dr. Hassan Rowan, with excision recommended. Pathology results were discussed with the patient by telephone. The patient reported doing well after the biopsies with tenderness at the sites. Post biopsy instructions and care were reviewed and questions were answered. The patient was encouraged to call [facility] for any additional concerns. The patient was referred to [facility] on June 09, 2018. Pathology results reported by Terie Purser, RN on 06/04/2018. Electronically Signed   By: Margarette Canada M.D.   On: 06/04/2018 08:19   Result Date: 06/04/2018 CLINICAL DATA:  72 year old female for tissue sampling of 1 cm UPPER-OUTER RIGHT breast mass and 0.6 cm OUTER RIGHT breast mass. EXAM: ULTRASOUND GUIDED RIGHT BREAST CORE NEEDLE BIOPSY X 2 COMPARISON:  Previous exam(s). FINDINGS: I met with the patient and we discussed the procedure of ultrasound-guided biopsy, including benefits and alternatives. We discussed the high likelihood of a successful procedure. We discussed the risks of the procedure, including infection, bleeding, tissue injury, clip migration, and inadequate sampling. Informed written consent was given. The usual time-out protocol was performed immediately prior to the procedure. ULTRASOUND-GUIDED UPPER-OUTER RIGHT BREAST MASS BIOPSY (RIBBON  clip): Lesion quadrant: UPPER-OUTER RIGHT BREAST Using sterile technique and 1% Lidocaine as local anesthetic, under direct ultrasound visualization, a 12 gauge spring-loaded device was used to perform biopsy of 1 cm hypoechoic mass at the 10 o'clock position of the RIGHT breast using a LATERAL approach. At the conclusion of the procedure a RIBBON tissue marker clip was deployed into the biopsy cavity. Follow up 2 view mammogram was performed and dictated separately. ULTRASOUND-GUIDED OUTER RIGHT BREAST MASS  BIOPSY (COIL clip): Lesion quadrant: OUTER RIGHT BREAST Using sterile technique and 1% Lidocaine as local anesthetic, under direct ultrasound visualization, a 12 gauge spring-loaded device was used to perform biopsy of 0.6 cm hypoechoic mass at the 9 o'clock position of the RIGHT breast using a LATERAL approach. At the conclusion of the procedure a COIL tissue marker clip was deployed into the biopsy cavity. Follow up 2 view mammogram was performed and dictated separately. IMPRESSION: Ultrasound guided biopsies of the 1 cm mass at the 10 o'clock position of the RIGHT breast and the 0.6 cm mass at the 9 o'clock position of the RIGHT breast. No apparent complications. Electronically Signed: By: Margarette Canada M.D. On: 06/02/2018 16:30   [place] Rt Breast Bx W Loc Dev Ea Add Lesion Img Bx Spec US Guide  Addendum Date: 06/04/2018   ADDENDUM REPORT: 06/04/2018 08:19 ADDENDUM: Pathology revealed GRADE II INVASIVE DUCTAL CARCINOMA, INTERMEDIATE NUCLEAR GRADE DUCTAL CARCINOMA IN SITU of the RIGHT breast, 10 o'clock position (ribbon clip). This was found to be concordant by Dr. Hassan Rowan. Pathology revealed BENIGN BREAST TISSUE SHOWING SCLEROTIC LESION WITH CALCIFICATIONS of the RIGHT breast, outer, 9 o'clock position (coil clip). Differential diagnosis can include a sclerosed papilloma. This was found to be concordant by Dr. Hassan Rowan, with excision recommended. Pathology results were discussed with the patient by telephone. The patient reported doing well after the biopsies with tenderness at the sites. Post biopsy instructions and care were reviewed and questions were answered. The patient was encouraged to call [facility] for any additional concerns. The patient was referred to [facility] on June 09, 2018. Pathology results reported by Terie Purser, RN on 06/04/2018. Electronically Signed   By: Margarette Canada M.D.   On:  06/04/2018 08:19   Result Date: 06/04/2018 CLINICAL DATA:  72 year old female for tissue sampling of 1 cm UPPER-OUTER RIGHT breast mass and 0.6 cm OUTER RIGHT breast mass. EXAM: ULTRASOUND GUIDED RIGHT BREAST CORE NEEDLE BIOPSY X 2 COMPARISON:  Previous exam(s). FINDINGS: I met with the patient and we discussed the procedure of ultrasound-guided biopsy, including benefits and alternatives. We discussed the high likelihood of a successful procedure. We discussed the risks of the procedure, including infection, bleeding, tissue injury, clip migration, and inadequate sampling. Informed written consent was given. The usual time-out protocol was performed immediately prior to the procedure. ULTRASOUND-GUIDED UPPER-OUTER RIGHT BREAST MASS BIOPSY (RIBBON clip): Lesion quadrant: UPPER-OUTER RIGHT BREAST Using sterile technique and 1% Lidocaine as local anesthetic, under direct ultrasound visualization, a 12 gauge spring-loaded device was used to perform biopsy of 1 cm hypoechoic mass at the 10 o'clock position of the RIGHT breast using a LATERAL approach. At the conclusion of the procedure a RIBBON tissue marker clip was deployed into the biopsy cavity. Follow up 2 view mammogram was performed and dictated separately. ULTRASOUND-GUIDED OUTER RIGHT BREAST MASS BIOPSY (COIL clip): Lesion quadrant: OUTER RIGHT BREAST Using sterile technique and 1% Lidocaine as local anesthetic,  under direct ultrasound visualization, a 12 gauge spring-loaded device was used to perform biopsy of 0.6 cm hypoechoic mass at the 9 o'clock position of the RIGHT breast using a LATERAL approach. At the conclusion of the procedure a COIL tissue marker clip was deployed into the biopsy cavity. Follow up 2 view mammogram was performed and dictated separately. IMPRESSION: Ultrasound guided biopsies of the 1 cm mass at the 10 o'clock position of the RIGHT breast and the 0.6 cm mass at the 9 o'clock position of the RIGHT breast. No apparent complications.  Electronically Signed: By: Margarette Canada M.D. On: 06/02/2018 16:30    ELIGIBLE FOR AVAILABLE RESEARCH PROTOCOL: Does not qualify for BR 003  ASSESSMENT: 72 y.o. [place] woman status post right breast upper outer quadrant biopsy 06/02/2018 for a clinical T1b N0, stage IB invasive ductal carcinoma, grade 2, triple negative, with an MIB-1 of 10%  (1) definitive surgery pending  (2) adjuvant chemotherapy: standard doxorubicin/cyclophosphamide in dose dense fashion x4 followed by weekly paclitaxel x12  (3) adjuvant radiation to follow  PLAN: I spent approximately 60 minutes face to face with Rachel Kelly with more than 50% of that time spent in counseling and coordination of care. Specifically we reviewed the biology of the patient's diagnosis and the specifics of her situation.  We first reviewed the fact that cancer is not one disease but more than 100 different diseases and that it is important to keep them separate-- otherwise when friends and relatives discuss their own cancer experiences with Ezabella confusion can result. Similarly we explained that if breast cancer spreads to the bone or liver, the patient would not have bone cancer or liver cancer, but breast cancer in the bone and breast cancer in the liver: one cancer in three places-- not 3 different cancers which otherwise would have to be treated in 3 different ways.  We discussed the difference between local and systemic therapy. In terms of loco-regional treatment, lumpectomy plus radiation is equivalent to mastectomy as far as survival is concerned. For this reason, and because the cosmetic results are generally superior, we recommend breast conserving surgery. We also noted that in terms of sequencing of treatments, whether systemic therapy or surgery is done first does not affect the ultimate outcome.  In her case we do not see a specific reason to proceed neoadjuvantly.  We then discussed the rationale for systemic therapy. There is some risk  that this cancer may have already spread to other parts of her body. Patients frequently ask at this point about bone scans, CAT scans and PET scans to find out if they have occult breast cancer somewhere else. The problem is that in early stage disease we are much more likely to find false positives then true cancers and this would expose the patient to unnecessary procedures as well as unnecessary radiation. Scans cannot answer the question the patient really would like to know, which is whether she has microscopic disease elsewhere in her body. For those reasons we do not recommend them.  Of course we would proceed to aggressive evaluation of any symptoms that might suggest metastatic disease, but that is not the case here.  Next we went over the options for systemic therapy which are anti-estrogens, anti-HER-2 immunotherapy, and chemotherapy. Kyrstal does not meet criteria for antiestrogens or anti-HER-2 immunotherapy.  Her only choice for systemic therapy is chemotherapy and accordingly that is what we recommend  Specifically she will receive cyclophosphamide and doxorubicin in dose dense fashion x4 followed by weekly  paclitaxel x12.  She will need a port, echocardiography, and chemotherapy school.  I have tentatively scheduled her to see me 06/25/2018 to discuss how to take her supportive medications, review the final pathology from her surgery, and schedule her initial chemotherapy  Imane has a good understanding of the overall plan. She agrees with it. She knows the goal of treatment in her case is cure. She will call with any problems that may develop before her next visit here.  Kelly, Rachel Dad, MD  06/09/18 5:14 PM Medical Oncology and Hematology [facility] [address] [place], [place] [address] Tel. [phone]    Fax. [phone]    I, Margit Banda am acting as a scribe for Chauncey Cruel, MD.   I, Lurline Del MD, have reviewed the above documentation  for accuracy and completeness, and I agree with the above.

## 2018-06-09 ENCOUNTER — Other Ambulatory Visit: Payer: Self-pay

## 2018-06-09 ENCOUNTER — Encounter: Payer: Self-pay | Admitting: Oncology

## 2018-06-09 ENCOUNTER — Inpatient Hospital Stay: Payer: Medicare Other | Attending: Oncology | Admitting: Oncology

## 2018-06-09 ENCOUNTER — Inpatient Hospital Stay: Payer: Medicare Other

## 2018-06-09 ENCOUNTER — Ambulatory Visit: Payer: Self-pay | Admitting: General Surgery

## 2018-06-09 ENCOUNTER — Encounter: Payer: Self-pay | Admitting: Physical Therapy

## 2018-06-09 ENCOUNTER — Ambulatory Visit: Payer: Medicare Other | Attending: General Surgery | Admitting: Physical Therapy

## 2018-06-09 ENCOUNTER — Ambulatory Visit
Admission: RE | Admit: 2018-06-09 | Discharge: 2018-06-09 | Disposition: A | Discharge status: Home / Self Care | Payer: Medicare Other | Source: Ambulatory Visit | Attending: Radiation Oncology | Admitting: Radiation Oncology

## 2018-06-09 VITALS — BP 127/77 | HR 77 | Temp 97.5°F | Resp 18 | Ht 59.0 in | Wt 169.8 lb

## 2018-06-09 DIAGNOSIS — C50411 Malignant neoplasm of upper-outer quadrant of right female breast: Secondary | ICD-10-CM

## 2018-06-09 DIAGNOSIS — R293 Abnormal posture: Secondary | ICD-10-CM | POA: Diagnosis present

## 2018-06-09 DIAGNOSIS — Z171 Estrogen receptor negative status [ER-]: Principal | ICD-10-CM

## 2018-06-09 LAB — CMP (CANCER CENTER ONLY)
ALK PHOS: 78 U/L (ref 38–126)
ALT: 15 U/L (ref 0–44)
AST: 23 U/L (ref 15–41)
Albumin: 3.9 g/dL (ref 3.5–5.0)
Anion gap: 7 (ref 5–15)
BUN: 17 mg/dL (ref 8–23)
CO2: 28 mmol/L (ref 22–32)
CREATININE: 1.27 mg/dL — AB (ref 0.44–1.00)
Calcium: 10 mg/dL (ref 8.9–10.3)
Chloride: 106 mmol/L (ref 98–111)
GFR, Est AFR Am: 47 mL/min — ABNORMAL LOW (ref >60)
GFR, Estimated: 41 mL/min — ABNORMAL LOW (ref >60)
Glucose, Bld: 97 mg/dL (ref 70–99)
Potassium: 4.6 mmol/L (ref 3.5–5.1)
SODIUM: 141 mmol/L (ref 135–145)
Total Bilirubin: 0.6 mg/dL (ref 0.3–1.2)
Total Protein: 7.7 g/dL (ref 6.5–8.1)

## 2018-06-09 LAB — CBC WITH DIFFERENTIAL (CANCER CENTER ONLY)
Abs Immature Granulocytes: 0.01 10*3/uL (ref 0.00–0.07)
Basophils Absolute: 0 10*3/uL (ref 0.0–0.1)
Basophils Relative: 0 %
EOS PCT: 2 %
Eosinophils Absolute: 0.1 10*3/uL (ref 0.0–0.5)
HEMATOCRIT: 39.7 % (ref 36.0–46.0)
HEMOGLOBIN: 13 g/dL (ref 12.0–15.0)
Immature Granulocytes: 0 %
LYMPHS ABS: 2 10*3/uL (ref 0.7–4.0)
LYMPHS PCT: 37 %
MCH: 30 pg (ref 26.0–34.0)
MCHC: 32.7 g/dL (ref 30.0–36.0)
MCV: 91.5 fL (ref 80.0–100.0)
MONO ABS: 0.4 10*3/uL (ref 0.1–1.0)
MONOS PCT: 8 %
Neutro Abs: 2.7 10*3/uL (ref 1.7–7.7)
Neutrophils Relative %: 53 %
Platelet Count: 217 10*3/uL (ref 150–400)
RBC: 4.34 MIL/uL (ref 3.87–5.11)
RDW: 12.8 % (ref 11.5–15.5)
WBC Count: 5.2 10*3/uL (ref 4.0–10.5)
nRBC: 0 % (ref 0.0–0.2)

## 2018-06-09 NOTE — Progress Notes (Signed)
Radiation Oncology         [phone] ________________________________  Initial outpatient Consultation  Name: Rachel Kelly MRN: [id number]  Date: 06/09/2018  DOB: 02-Jun-1946  JA:SNKN, Berneta Sages, MD  [name], MD   REFERRING PHYSICIAN: [name], MD  DIAGNOSIS:    ICD-10-CM   1. Malignant neoplasm of upper-outer quadrant of right breast in female, estrogen receptor negative ([place]) C50.411    Z17.1   Cancer Staging Malignant neoplasm of upper-outer quadrant of right breast in female, estrogen receptor negative ([place]) Staging form: Breast, AJCC 8th Edition - Clinical stage from 06/09/2018: Stage IB (cT1b, cN0, cM0, G2, ER-, PR-, HER2-) - Unsigned  CHIEF COMPLAINT: Here to discuss management of right breast cancer  HISTORY OF PRESENT ILLNESS::Rachel Kelly is a 72 y.o. female who presented with breast abnormality on the following imaging: screening mammogram. This showed right breast mass and calcifications. Symptoms, if any, at that time, were: none.   Ultrasound of breast revealed 3 masses in the right breast; one at 10:00 (78m, with adjacent mass measuring 979m and another at 9:00 (23m61m   Axilla negative on US.Koreaiopsy of the larger 10:00 mass and the 9:00 mass were performed.  10:00 bx showed IDC Gr 2 with DCIS, triple negative.  9:00 bx showed sclerotic lesion.   She is with family today (daughter).  ROS positive for wrist, and foot pain.  Also for coughing (dry) and right breast masses.  Glasses for vision.  PREVIOUS RADIATION THERAPY: No  PAST MEDICAL HISTORY:  has a past medical history of Anemia, Blood transfusion (2005), Hernia, HH (hiatus hernia), Hyperlipidemia, Hypertension, PAH (pulmonary artery hypertension) [place], Shortness of breath, Sleep apnea, and Wheezing.    PAST SURGICAL HISTORY: Past Surgical History:  Procedure Laterality Date  . ABDOMINAL HYSTERECTOMY  1988  . BACK SURGERY  2005   lower  . ESOPHAGOGASTRODUODENOSCOPY   06/24/2012   Procedure: ESOPHAGOGASTRODUODENOSCOPY (EGD);  Surgeon: DavShann MedalD;  [facility];  Service: General;  Laterality: N/A;  . HIATAL HERNIA REPAIR  08/18/2012   Procedure: LAPAROSCOPIC REPAIR OF HIATAL HERNIA;  Surgeon: BenEdward JollyD;  Location: [facility];  Service: General;  Laterality: N/A;  Laparoscopic Hiatal Hernia Repair   . INSERTION OF MESH  08/18/2012   Procedure: INSERTION OF MESH;  Surgeon: BenEdward JollyD;  Location: [facility];  Service: General;;  . VESICOVAGINAL FISTULA CLOSURE W/ TAH      FAMILY HISTORY: family history includes Cancer in her maternal grandmother and unknown relative; Clotting disorder in her mother; Coronary artery disease in her unknown relative; Heart disease in her father.  SOCIAL HISTORY:  reports that she has never smoked. She has never used smokeless tobacco. She reports that she does not drink alcohol or use drugs.  ALLERGIES: Penicillins and Skin adhesives [cyanoacrylate]  MEDICATIONS:  Current Outpatient Medications  Medication Sig Dispense Refill  . Carboxymethylcellul-Glycerin (OPTIVE) 0.5-0.9 % SOLN Apply 1 drop to eye 3 (three) times daily as needed. Dry eyes    . Cholecalciferol (VITAMIN D) 2000 UNITS CAPS Take 1 capsule by mouth daily.    . Coenzyme Q10 200 MG TABS Take 200 mg by mouth daily.    . kMarland Kitchentotifen (ALAWAY) 0.025 % ophthalmic solution Place 1 drop into both eyes 2 (two) times daily.    . Magnesium 500 MG TABS Take 500 mg by mouth daily.    . NMarland KitchenXIUM 40 MG capsule     . potassium gluconate 595 MG TABS Take  595 mg by mouth daily.     . simvastatin (ZOCOR) 20 MG tablet Take 20 mg by mouth every morning.     . triamterene-hydrochlorothiazide (MAXZIDE-25) 37.5-25 MG per tablet Take 1 tablet by mouth every morning.      No current facility-administered medications for this encounter.     REVIEW OF SYSTEMS: A 10+ POINT REVIEW OF SYSTEMS WAS OBTAINED including neurology, dermatology, psychiatry, cardiac,  respiratory, lymph, extremities, GI, GU, Musculoskeletal, constitutional, breasts, reproductive, HEENT.  All pertinent positives are noted in the HPI.  All others are negative.   PHYSICAL EXAM:  Vitals with Age-Percentiles 06/09/2018  Length 762.8 cm  Systolic 315  Diastolic 77  MAP   Pulse 77  Respiration 18  Weight 77.021 kg  BMI 34.28  VISIT REPORT    General: Alert and oriented, in no acute distress HEENT: Head is normocephalic. Extraocular movements are intact. Oropharynx is clear. Neck: Neck is supple, no palpable cervical or supraclavicular lymphadenopathy. Heart: Regular in rate and rhythm with no murmurs, rubs, or gallops. Chest: Clear to auscultation bilaterally, with no rhonchi, wheezes, or rales. Abdomen: Soft, nontender, nondistended, with no rigidity or guarding. Extremities: No cyanosis or edema. Lymphatics: see Neck Exam Skin: No concerning lesions. Musculoskeletal: symmetric strength and muscle tone throughout. Neurologic: Cranial nerves II through XII are grossly intact. No obvious focalities. Speech is fluent. Coordination is intact. Psychiatric: Judgment and insight are intact. Affect is appropriate. Breasts: palpable thickening at biopsy sites in right breast, UOQ . No other palpable masses appreciated in the breasts or axillae bilaterally .   ECOG = 0  0 - Asymptomatic (Fully active, able to carry on all predisease activities without restriction)  1 - Symptomatic but completely ambulatory (Restricted in physically strenuous activity but ambulatory and able to carry out work of a light or sedentary nature. For example, light housework, office work)  2 - Symptomatic, <50% in bed during the day (Ambulatory and capable of all self care but unable to carry out any work activities. Up and about more than 50% of waking hours)  3 - Symptomatic, >50% in bed, but not bedbound (Capable of only limited self-care, confined to bed or chair 50% or more of waking  hours)  4 - Bedbound (Completely disabled. Cannot carry on any self-care. Totally confined to bed or chair)  5 - Death   Eustace Pen MM, Creech RH, Tormey DC, et al. [phone]). "Toxicity and response criteria of the [facility]". [place] Oncol. 5 (6): 649-55   LABORATORY DATA:  Lab Results  Component Value Date   WBC 5.2 06/09/2018   HGB 13.0 06/09/2018   HCT 39.7 06/09/2018   MCV 91.5 06/09/2018   PLT 217 06/09/2018   CMP     Component Value Date/Time   NA 141 06/09/2018 1239   K 4.6 06/09/2018 1239   CL 106 06/09/2018 1239   CO2 28 06/09/2018 1239   GLUCOSE 97 06/09/2018 1239   BUN 17 06/09/2018 1239   CREATININE 1.27 (H) 06/09/2018 1239   CALCIUM 10.0 06/09/2018 1239   PROT 7.7 06/09/2018 1239   ALBUMIN 3.9 06/09/2018 1239   AST 23 06/09/2018 1239   ALT 15 06/09/2018 1239   ALKPHOS 78 06/09/2018 1239   BILITOT 0.6 06/09/2018 1239   GFRNONAA 41 (L) 06/09/2018 1239   GFRAA 47 (L) 06/09/2018 1239         RADIOGRAPHY: US Breast Ltd Uni Right Inc Axilla  Result Date: 06/02/2018 CLINICAL DATA:  72 year old  female recalled from screening mammogram dated 05/26/2018 for possible right breast mass with calcifications and asymmetry. EXAM: DIGITAL DIAGNOSTIC RIGHT MAMMOGRAM WITH CAD AND TOMO ULTRASOUND RIGHT BREAST COMPARISON:  Previous exam(s). ACR Breast Density Category c: The breast tissue is heterogeneously dense, which may obscure small masses. FINDINGS: There is a persistent asymmetry with associated coarse heterogeneous calcifications in the lower outer aspect of the right breast at middle depth. An additional spiculated mass persists in the lateral right breast at middle depth, best seen on the CC projection. This localizes centrally on tomosynthesis. Further evaluation of both areas was performed with ultrasound. Mammographic images were processed with CAD. Targeted ultrasound is performed, showing an irregular hypoechoic mass at the 10 o'clock position  8 cm from the nipple. It measures 10 x 7 x 6 mm. There is no definite associated vascularity. Just adjacent to this mass, there is an irregular hypoechoic mass with punctate echogenic foci. It measures 9 x 8 x 3 mm. An additional hypoechoic mass with echogenic foci is demonstrated at the 9 o'clock position 6 cm from the nipple. It measures 6 x 4 x 4 mm. There is suggestion of associated vascularity. Evaluation of the right axilla demonstrates no suspicious lymphadenopathy. IMPRESSION: 1. Suspicious mass at the 10 o'clock position 8 cm from the nipple scratch. Recommendation is for ultrasound-guided biopsy. 2. Additional indeterminate mass at the 9 o'clock position 6 cm from the nipple. Recommendation is for ultrasound-guided biopsy. 3. No suspicious right axillary lymphadenopathy. RECOMMENDATION: Two area ultrasound-guided biopsy of the right breast. This was performed to follow. Please see additional dictation. I have discussed the findings and recommendations with the patient. Results were also provided in writing at the conclusion of the visit. If applicable, a reminder letter will be sent to the patient regarding the next appointment. BI-RADS CATEGORY  4: Suspicious. Electronically Signed   By: Kristopher Oppenheim M.D.   On: 06/02/2018 16:54   Mm Diag Breast Tomo Uni Right  Result Date: 06/02/2018 CLINICAL DATA:  72 year old female recalled from screening mammogram dated 05/26/2018 for possible right breast mass with calcifications and asymmetry. EXAM: DIGITAL DIAGNOSTIC RIGHT MAMMOGRAM WITH CAD AND TOMO ULTRASOUND RIGHT BREAST COMPARISON:  Previous exam(s). ACR Breast Density Category c: The breast tissue is heterogeneously dense, which may obscure small masses. FINDINGS: There is a persistent asymmetry with associated coarse heterogeneous calcifications in the lower outer aspect of the right breast at middle depth. An additional spiculated mass persists in the lateral right breast at middle depth, best seen on  the CC projection. This localizes centrally on tomosynthesis. Further evaluation of both areas was performed with ultrasound. Mammographic images were processed with CAD. Targeted ultrasound is performed, showing an irregular hypoechoic mass at the 10 o'clock position 8 cm from the nipple. It measures 10 x 7 x 6 mm. There is no definite associated vascularity. Just adjacent to this mass, there is an irregular hypoechoic mass with punctate echogenic foci. It measures 9 x 8 x 3 mm. An additional hypoechoic mass with echogenic foci is demonstrated at the 9 o'clock position 6 cm from the nipple. It measures 6 x 4 x 4 mm. There is suggestion of associated vascularity. Evaluation of the right axilla demonstrates no suspicious lymphadenopathy. IMPRESSION: 1. Suspicious mass at the 10 o'clock position 8 cm from the nipple scratch. Recommendation is for ultrasound-guided biopsy. 2. Additional indeterminate mass at the 9 o'clock position 6 cm from the nipple. Recommendation is for ultrasound-guided biopsy. 3. No suspicious right axillary lymphadenopathy. RECOMMENDATION: Two  area ultrasound-guided biopsy of the right breast. This was performed to follow. Please see additional dictation. I have discussed the findings and recommendations with the patient. Results were also provided in writing at the conclusion of the visit. If applicable, a reminder letter will be sent to the patient regarding the next appointment. BI-RADS CATEGORY  4: Suspicious. Electronically Signed   By: Kristopher Oppenheim M.D.   On: 06/02/2018 16:54   Mm Screening Breast Tomo Bilateral  Result Date: 05/27/2018 CLINICAL DATA:  Screening. EXAM: DIGITAL SCREENING BILATERAL MAMMOGRAM WITH TOMO AND CAD COMPARISON:  Previous exam(s). ACR Breast Density Category c: The breast tissue is heterogeneously dense, which may obscure small masses. FINDINGS: In the right breast, a possible mass and calcifications along with adjacent asymmetry warrants further evaluation.  In the left breast, no findings suspicious for malignancy. Images were processed with CAD. IMPRESSION: Further evaluation is suggested for possible mass and calcifications along with adjacent asymmetry in the right breast. RECOMMENDATION: Diagnostic mammogram and possibly ultrasound of the right breast. (Code:[id number]) The patient will be contacted regarding the findings, and additional imaging will be scheduled. BI-RADS CATEGORY  0: Incomplete. Need additional imaging evaluation and/or prior mammograms for comparison. Electronically Signed   By: Lovey Newcomer M.D.   On: 05/27/2018 12:40   Mm Clip Placement Right  Result Date: 06/02/2018 CLINICAL DATA:  Evaluate placement of biopsy clips following 2 ultrasound-guided RIGHT breast biopsies. EXAM: DIAGNOSTIC RIGHT MAMMOGRAM POST ULTRASOUND BIOPSY COMPARISON:  Previous exam(s). FINDINGS: Mammographic images were obtained following ultrasound guided biopsy of the 1 cm mass at the 10 o'clock position of the RIGHT breast and the 0.6 cm mass at the 9 o'clock position of the RIGHT breast. The RIBBON shaped clip is in satisfactory position corresponding to the 1 cm mass biopsied at the 10 o'clock position. The COIL shaped clip lies 7 mm posterior/lateral/superior to the 0.6 cm mass biopsied at the 9 o'clock position. The 2 clips are separated by a distance of 4 cm craniocaudally. The areas biopsied by ultrasound guidance correspond to the findings identified mammographically. IMPRESSION: Satisfactory RIBBON clip position following ultrasound-guided biopsy of the 1 cm UPPER-OUTER RIGHT breast mass. COIL clip position located 7 mm from the 0.6 cm mass biopsied within the OUTER RIGHT breast. Final Assessment: Post Procedure Mammograms for Marker Placement Electronically Signed   By: Margarette Canada M.D.   On: 06/02/2018 16:43   [place] Rt Breast Bx W Loc Dev 1st Lesion Img Bx Spec US Guide  Addendum Date: 06/04/2018   ADDENDUM REPORT: 06/04/2018 08:19 ADDENDUM: Pathology  revealed GRADE II INVASIVE DUCTAL CARCINOMA, INTERMEDIATE NUCLEAR GRADE DUCTAL CARCINOMA IN SITU of the RIGHT breast, 10 o'clock position (ribbon clip). This was found to be concordant by Dr. Hassan Rowan. Pathology revealed BENIGN BREAST TISSUE SHOWING SCLEROTIC LESION WITH CALCIFICATIONS of the RIGHT breast, outer, 9 o'clock position (coil clip). Differential diagnosis can include a sclerosed papilloma. This was found to be concordant by Dr. Hassan Rowan, with excision recommended. Pathology results were discussed with the patient by telephone. The patient reported doing well after the biopsies with tenderness at the sites. Post biopsy instructions and care were reviewed and questions were answered. The patient was encouraged to call [facility] for any additional concerns. The patient was referred to [facility] on June 09, 2018. Pathology results reported by Terie Purser, RN on 06/04/2018. Electronically Signed   By: Cleatis Polka.D.  On: 06/04/2018 08:19   Result Date: 06/04/2018 CLINICAL DATA:  72 year old female for tissue sampling of 1 cm UPPER-OUTER RIGHT breast mass and 0.6 cm OUTER RIGHT breast mass. EXAM: ULTRASOUND GUIDED RIGHT BREAST CORE NEEDLE BIOPSY X 2 COMPARISON:  Previous exam(s). FINDINGS: I met with the patient and we discussed the procedure of ultrasound-guided biopsy, including benefits and alternatives. We discussed the high likelihood of a successful procedure. We discussed the risks of the procedure, including infection, bleeding, tissue injury, clip migration, and inadequate sampling. Informed written consent was given. The usual time-out protocol was performed immediately prior to the procedure. ULTRASOUND-GUIDED UPPER-OUTER RIGHT BREAST MASS BIOPSY (RIBBON clip): Lesion quadrant: UPPER-OUTER RIGHT BREAST Using sterile technique and 1% Lidocaine as local anesthetic, under direct  ultrasound visualization, a 12 gauge spring-loaded device was used to perform biopsy of 1 cm hypoechoic mass at the 10 o'clock position of the RIGHT breast using a LATERAL approach. At the conclusion of the procedure a RIBBON tissue marker clip was deployed into the biopsy cavity. Follow up 2 view mammogram was performed and dictated separately. ULTRASOUND-GUIDED OUTER RIGHT BREAST MASS BIOPSY (COIL clip): Lesion quadrant: OUTER RIGHT BREAST Using sterile technique and 1% Lidocaine as local anesthetic, under direct ultrasound visualization, a 12 gauge spring-loaded device was used to perform biopsy of 0.6 cm hypoechoic mass at the 9 o'clock position of the RIGHT breast using a LATERAL approach. At the conclusion of the procedure a COIL tissue marker clip was deployed into the biopsy cavity. Follow up 2 view mammogram was performed and dictated separately. IMPRESSION: Ultrasound guided biopsies of the 1 cm mass at the 10 o'clock position of the RIGHT breast and the 0.6 cm mass at the 9 o'clock position of the RIGHT breast. No apparent complications. Electronically Signed: By: Margarette Canada M.D. On: 06/02/2018 16:30   [place] Rt Breast Bx W Loc Dev Ea Add Lesion Img Bx Spec US Guide  Addendum Date: 06/04/2018   ADDENDUM REPORT: 06/04/2018 08:19 ADDENDUM: Pathology revealed GRADE II INVASIVE DUCTAL CARCINOMA, INTERMEDIATE NUCLEAR GRADE DUCTAL CARCINOMA IN SITU of the RIGHT breast, 10 o'clock position (ribbon clip). This was found to be concordant by Dr. Hassan Rowan. Pathology revealed BENIGN BREAST TISSUE SHOWING SCLEROTIC LESION WITH CALCIFICATIONS of the RIGHT breast, outer, 9 o'clock position (coil clip). Differential diagnosis can include a sclerosed papilloma. This was found to be concordant by Dr. Hassan Rowan, with excision recommended. Pathology results were discussed with the patient by telephone. The patient reported doing well after the biopsies with tenderness at the sites. Post biopsy instructions and care were  reviewed and questions were answered. The patient was encouraged to call [facility] for any additional concerns. The patient was referred to [facility] on June 09, 2018. Pathology results reported by Terie Purser, RN on 06/04/2018. Electronically Signed   By: Margarette Canada M.D.   On: 06/04/2018 08:19   Result Date: 06/04/2018 CLINICAL DATA:  72 year old female for tissue sampling of 1 cm UPPER-OUTER RIGHT breast mass and 0.6 cm OUTER RIGHT breast mass. EXAM: ULTRASOUND GUIDED RIGHT BREAST CORE NEEDLE BIOPSY X 2 COMPARISON:  Previous exam(s). FINDINGS: I met with the patient and we discussed the procedure of ultrasound-guided biopsy, including benefits and alternatives. We discussed the high likelihood of a successful procedure. We discussed the risks of the procedure, including infection, bleeding, tissue injury, clip migration, and inadequate sampling. Informed written consent was given. The usual time-out  protocol was performed immediately prior to the procedure. ULTRASOUND-GUIDED UPPER-OUTER RIGHT BREAST MASS BIOPSY (RIBBON clip): Lesion quadrant: UPPER-OUTER RIGHT BREAST Using sterile technique and 1% Lidocaine as local anesthetic, under direct ultrasound visualization, a 12 gauge spring-loaded device was used to perform biopsy of 1 cm hypoechoic mass at the 10 o'clock position of the RIGHT breast using a LATERAL approach. At the conclusion of the procedure a RIBBON tissue marker clip was deployed into the biopsy cavity. Follow up 2 view mammogram was performed and dictated separately. ULTRASOUND-GUIDED OUTER RIGHT BREAST MASS BIOPSY (COIL clip): Lesion quadrant: OUTER RIGHT BREAST Using sterile technique and 1% Lidocaine as local anesthetic, under direct ultrasound visualization, a 12 gauge spring-loaded device was used to perform biopsy of 0.6 cm hypoechoic mass at the 9 o'clock position of the RIGHT  breast using a LATERAL approach. At the conclusion of the procedure a COIL tissue marker clip was deployed into the biopsy cavity. Follow up 2 view mammogram was performed and dictated separately. IMPRESSION: Ultrasound guided biopsies of the 1 cm mass at the 10 o'clock position of the RIGHT breast and the 0.6 cm mass at the 9 o'clock position of the RIGHT breast. No apparent complications. Electronically Signed: By: Margarette Canada M.D. On: 06/02/2018 16:30      IMPRESSION/PLAN: right breast cancer, triple negative  She has been discussed at our multidisciplinary tumor board.  The consensus is that she would be a good candidate for breast conservation. I talked to her about the option of a mastectomy and informed her that her expected overall survival would be equivalent between mastectomy and breast conservation, based upon randomized controlled data. She is enthusiastic about breast conservation.  It was a pleasure meeting the patient today. We discussed the risks, benefits, and side effects of radiotherapy. I recommend radiotherapy to the right breast to reduce her risk of locoregional recurrence by 2/3.  We discussed that radiation would take approximately weeks to complete and that I would give the patient a few weeks to heal following surgery before starting treatment planning. As chemotherapy will likely be given, this would precede radiotherapy. We spoke about acute effects including skin irritation and fatigue as well as much less common late effects including internal organ injury or irritation. We spoke about the latest technology that is used to minimize the risk of late effects for patients undergoing radiotherapy to the breast or chest wall. No guarantees of treatment were given. The patient is enthusiastic about proceeding with treatment. I look forward to participating in the patient's care.  I will await her referral back to me for postoperative follow-up and eventual CT simulation/treatment  planning. __________________________________________   Eppie Gibson, MD

## 2018-06-09 NOTE — Therapy (Signed)
[address] [place], [place], [address] Phone: [phone]   Fax:  [phone]  Physical Therapy Evaluation  Patient Details  Name: Rachel Kelly MRN: [id number] Date of Birth: [birth year] Referring Provider (PT): Dr. [name]   Encounter Date: 06/09/2018  PT End of Session - 06/09/18 1604    Visit Number  1    Number of Visits  2    Date for PT Re-Evaluation  08/04/18    PT Start Time  9242    PT Stop Time  1558    PT Time Calculation (min)  25 min    Activity Tolerance  Patient tolerated treatment well    Behavior During Therapy  [facility] for tasks assessed/performed       Past Medical History:  Diagnosis Date  . Anemia   . Blood transfusion 2005  . Hernia   . HH (hiatus hernia)   . Hyperlipidemia   . Hypertension   . PAH (pulmonary artery hypertension) ([place])   . Sarcoidosis   . Shortness of breath    on exertion  . Sleep apnea    on o2 at night  . Wheezing     Past Surgical History:  Procedure Laterality Date  . ABDOMINAL HYSTERECTOMY  1988  . BACK SURGERY  2005   lower  . ESOPHAGOGASTRODUODENOSCOPY  06/24/2012   Procedure: ESOPHAGOGASTRODUODENOSCOPY (EGD);  Surgeon: [name], MD;  Location: [name];  Service: General;  Laterality: N/A;  . HIATAL HERNIA REPAIR  08/18/2012   Procedure: LAPAROSCOPIC REPAIR OF HIATAL HERNIA;  Surgeon: [name], MD;  Location: [facility];  Service: General;  Laterality: N/A;  Laparoscopic Hiatal Hernia Repair   . INSERTION OF MESH  08/18/2012   Procedure: INSERTION OF MESH;  Surgeon: [name], MD;  Location: [facility];  Service: General;;  . VESICOVAGINAL FISTULA CLOSURE W/ TAH      There were no vitals filed for this visit.   Subjective Assessment - 06/09/18 1559    Subjective  Patient reports she is here today to be seen by her medical team for her newly diagnosed right breast cancer.    Patient is accompained by:  Family  member    Pertinent History  Patient was diagnosed on 05/26/18 with right triple negative invasive ductal carcinoma breast cancer. It measures 1 cm and is located in the upper outer quadrant with a Ki67 of 10%.    Patient Stated Goals  Reduce lymphedema risk and learn post op shoulder ROM HEP    Currently in Pain?  Yes    Pain Score  1     Pain Location  Foot    Pain Orientation  Right;Lateral    Pain Descriptors / Indicators  Sharp    Pain Type  Chronic pain    Pain Onset  More than a month ago    Pain Frequency  Intermittent    Aggravating Factors   Unknown - pain happens randomly    Pain Relieving Factors  Unknown         [facility] PT Assessment - 06/09/18 0001      Assessment   Medical Diagnosis  Right breast cancer    Referring Provider (PT)  Dr. [name]    Onset Date/Surgical Date  05/26/18    Hand Dominance  Left    Prior Therapy  none      Precautions   Precautions  Other (comment)    Precaution Comments  active cancer  Restrictions   Weight Bearing Restrictions  No      Balance Screen   Has the patient fallen in the past 6 months  No    Has the patient had a decrease in activity level because of a fear of falling?   No    Is the patient reluctant to leave their home because of a fear of falling?   No      Home Film/video editor residence    Living Arrangements  Alone    Available Help at Discharge  Family      Prior Function   Level of Independence  Independent    Vocation  Retired    Biomedical scientist  Retired Patent examiner    Leisure  She does not exercise      Cognition   Overall Cognitive Status  Within Functional Limits for tasks assessed      Posture/Postural Control   Posture/Postural Control  Postural limitations    Postural Limitations  Forward head;Rounded Shoulders      ROM / Strength   AROM / PROM / Strength  AROM;Strength      AROM   AROM Assessment Site  Shoulder;Cervical    Right/Left  Shoulder  Right;Left    Right Shoulder Extension  50 Degrees    Right Shoulder Flexion  120 Degrees    Right Shoulder ABduction  142 Degrees    Right Shoulder Internal Rotation  77 Degrees    Right Shoulder External Rotation  73 Degrees    Left Shoulder Extension  55 Degrees    Left Shoulder Flexion  135 Degrees    Left Shoulder ABduction  142 Degrees    Left Shoulder Internal Rotation  72 Degrees    Left Shoulder External Rotation  68 Degrees    Cervical Flexion  WNL    Cervical Extension  WNL    Cervical - Right Side Bend  25% limited    Cervical - Left Side Bend  25% limited    Cervical - Right Rotation  WNL    Cervical - Left Rotation  WNL      Strength   Overall Strength  Within functional limits for tasks performed        LYMPHEDEMA/ONCOLOGY QUESTIONNAIRE - 06/09/18 1603      Type   Cancer Type  Right breast cancer      Lymphedema Assessments   Lymphedema Assessments  Upper extremities      Right Upper Extremity Lymphedema   10 cm Proximal to Olecranon Process  34.9 cm    Olecranon Process  25.6 cm    10 cm Proximal to Ulnar Styloid Process  25.4 cm    Just Proximal to Ulnar Styloid Process  16.3 cm    Across Hand at [employer]  19.2 cm    At [place] of 2nd Digit  6.3 cm      Left Upper Extremity Lymphedema   10 cm Proximal to Olecranon Process  34.8 cm    Olecranon Process  25.6 cm    10 cm Proximal to Ulnar Styloid Process  25.5 cm    Just Proximal to Ulnar Styloid Process  16.7 cm    Across Hand at [employer]  18.9 cm    At [place] of 2nd Digit  6.1 cm             Objective measurements completed on examination: See above findings.  PT Education - 06/09/18 1604    Education Details  Lymphedema risk reduction and shoulder ROM HEP for post op    Person(s) Educated  Patient;Other (comment)   sister   Methods  Explanation;Handout    Comprehension  Returned demonstration;Verbalized understanding          PT Long Term Goals -  06/09/18 1607      PT LONG TERM GOAL #1   Title  Patient will demonstrate she has returned to baseline realted to shoulder function and ROM post operatively.    Time  [facility] Goals - 06/09/18 1607      Patient will be able to verbalize understanding of pertinent lymphedema risk reduction practices relevant to her diagnosis specifically related to skin care.   Time  1    Period  Days    Status  Achieved      Patient will be able to return demonstrate and/or verbalize understanding of the post-op home exercise program related to regaining shoulder range of motion.   Time  1    Period  Days    Status  Achieved      Patient will be able to verbalize understanding of the importance of attending the postoperative After Breast Cancer Class for further lymphedema risk reduction education and therapeutic exercise.   Time  1    Period  Days    Status  Achieved            Plan - 06/09/18 1605    Clinical Impression Statement  Patient was diagnosed on 05/26/18 with right triple negative invasive ductal carcinoma breast cancer. It measures 1 cm and is located in the upper outer quadrant with a Ki67 of 10%. Her multidisciplinary medical team met prior to her assessments to determine a recommended treatment plan. She is planning to have a right lumpectomy and sentinel node biopsy followed by chemotherapy and radiation. She will benefit from a post op reassessment to determine needs.     History and Personal Factors relevant to plan of care:  Lives alone    Clinical Presentation  Stable    Clinical Decision Making  Low    Rehab Potential  Excellent    Clinical Impairments Affecting Rehab Potential  None    PT Frequency  --   Eval and 1 f/u visit   PT Treatment/Interventions  ADLs/Self Care Home Management;Therapeutic exercise;Patient/family education    PT Next Visit Plan  Will reassess 3-4 weeks post op to determine needs    PT Home Exercise  Plan  Post op shoulder ROM HEP    Consulted and Agree with Plan of Care  Patient;Family member/caregiver    Family Member Consulted  sister       Patient will benefit from skilled therapeutic intervention in order to improve the following deficits and impairments:  Postural dysfunction, Decreased range of motion, Decreased knowledge of precautions, Impaired UE functional use, Pain  Visit Diagnosis: Malignant neoplasm of upper-outer quadrant of right breast in female, estrogen receptor negative ([place]) - Plan: PT plan of care cert/re-cert  Abnormal posture - Plan: PT plan of care cert/re-cert   Patient will follow up at outpatient cancer rehab 3-4 weeks following surgery.  If the patient requires physical therapy at that time, a specific plan will be dictated and sent to the referring physician for approval. The patient was educated today on appropriate basic range of  motion exercises to begin post operatively and the importance of attending the After Breast Cancer class following surgery.  Patient was educated today on lymphedema risk reduction practices as it pertains to recommendations that will benefit the patient immediately following surgery.  She verbalized good understanding.     Problem List Patient Active Problem List   Diagnosis Date Noted  . Malignant neoplasm of upper-outer quadrant of right breast in female, estrogen receptor negative ([place]) 06/07/2018  . Nausea & vomiting 11/21/2012  . Bilateral renal masses 11/21/2012  . Back pain, thoracic 08/14/2012  . Obstructive sleep apnea 05/09/2012  . GERD (gastroesophageal reflux disease) 06/28/2010  . HYPERLIPIDEMIA 12/09/2007  . HYPERTENSION 12/09/2007  . Sarcoidosis 11/26/2007  . DYSPNEA 11/26/2007   Annia [place], PT 06/09/18 4:10 PM   [place], [place], [address] Phone: [phone]   Fax:  [phone]  Name: LATERICA MATARAZZO MRN:  [id number] Date of Birth: September 04, 1945

## 2018-06-09 NOTE — Patient Instructions (Signed)

## 2018-06-11 ENCOUNTER — Other Ambulatory Visit: Payer: Self-pay | Admitting: General Surgery

## 2018-06-11 ENCOUNTER — Telehealth: Payer: Self-pay | Admitting: *Deleted

## 2018-06-11 ENCOUNTER — Encounter: Payer: Self-pay | Admitting: Radiation Oncology

## 2018-06-11 ENCOUNTER — Telehealth: Payer: Self-pay

## 2018-06-11 DIAGNOSIS — C50411 Malignant neoplasm of upper-outer quadrant of right female breast: Secondary | ICD-10-CM

## 2018-06-11 DIAGNOSIS — Z171 Estrogen receptor negative status [ER-]: Principal | ICD-10-CM

## 2018-06-11 NOTE — Telephone Encounter (Signed)
Spoke to pt concerning [place] from 06/09/18. Denies questions or concerns regarding dx or treatment care plan. Encourage pt to call with needs. Received verbal understanding.

## 2018-06-11 NOTE — Progress Notes (Signed)
Attempted to call patient for follow up after [facility]. The patient was unavailable, left voicemail with call back information if any needs for Patient and Family Support programs arise.   Doris Cheadle, Counseling Intern [phone]

## 2018-06-11 NOTE — Telephone Encounter (Signed)
Called pt to advise her that she would need an office visit (NP) if needed to fill out medical clearance form for a breast procedure. I have left her a message to call me back to schedule appt. Will route to Katie to follow up on.

## 2018-06-11 NOTE — Telephone Encounter (Signed)
ATC pt, no answer. Left message for pt to call back.  

## 2018-06-14 NOTE — Telephone Encounter (Signed)
Pt is calling back [phone]

## 2018-06-14 NOTE — Telephone Encounter (Signed)
lmtcb for pt. Pt hasn't been seen in almost 3 years, will need to be scheduled for an office visit to fill out any paperwork for procedures.

## 2018-06-15 ENCOUNTER — Telehealth: Payer: Self-pay | Admitting: Oncology

## 2018-06-15 NOTE — Progress Notes (Signed)
Called patient in response to voicemail patient left to follow-up after 06/11/18 missed call from counselor. Patient reported that she has no current needs for Patient and [place], but she will reach out if any needs arise.  Doris Cheadle, Counseling Intern [phone]

## 2018-06-15 NOTE — Telephone Encounter (Signed)
Scheduled appt per 11/22 sch message - left message for patient with appt date and time 

## 2018-06-18 NOTE — Telephone Encounter (Signed)
Pt is calling back  [phone]

## 2018-06-18 NOTE — Telephone Encounter (Signed)
Spoke with pt, advised her that she would need to schedule an appt with NP for surgical clearance. Pt understood and made appt with BW on Monday 12/2 @2 :30pm. Nothing further is needed.

## 2018-06-21 ENCOUNTER — Encounter: Payer: Self-pay | Admitting: Primary Care

## 2018-06-21 ENCOUNTER — Ambulatory Visit: Payer: Medicare Other | Admitting: Primary Care

## 2018-06-21 ENCOUNTER — Other Ambulatory Visit (HOSPITAL_COMMUNITY): Payer: Medicare Other

## 2018-06-21 ENCOUNTER — Inpatient Hospital Stay (HOSPITAL_COMMUNITY): Admission: RE | Admit: 2018-06-21 | Payer: Medicare Other | Source: Ambulatory Visit

## 2018-06-21 VITALS — BP 102/76 | HR 87 | Temp 98.0°F | Ht 59.0 in | Wt 170.2 lb

## 2018-06-21 DIAGNOSIS — Z01818 Encounter for other preprocedural examination: Secondary | ICD-10-CM

## 2018-06-21 DIAGNOSIS — D869 Sarcoidosis, unspecified: Secondary | ICD-10-CM | POA: Diagnosis not present

## 2018-06-21 NOTE — Patient Instructions (Addendum)
Spirometry today  Recently had labs done (which include CMET and CBC)  Cleared from pulmonary standpoint for right breast lumpectomy   Follow-up with Dr. Annamaria Boots in 8-12 months

## 2018-06-21 NOTE — Progress Notes (Signed)
_0  ID: Rachel Kelly, female    DOB: April 17, 1946, 72 y.o.   MRN: [id number]  Chief Complaint  Patient presents with  . Follow-up    surgical clearance    Referring provider: [name], MD  HPI: 72 year female, never smoked. PMH sarcoidosis, OSA, GERD, HTN. Patient Dr. Annamaria Boots, last seen 08/15/17.   06/21/2018 Patient presents today for surgical clearance for lumpectomy with Dr. [name]. She feels well with no acute complaints. Breathing ok with no limitations. Continues to walk a mile everyday. She is able to walk down the aisle in a grocery store and a flight of stairs without shortness of breath. Has some dyspnea with large hills.   She had a split night sleep study in 2013, NPSG showed AHI 17.6/hr. Has not worn CPAP mask for some time since repair of very large hiatal hernia. No daytime fatigue or snoring that she knows of. Sleeps ok, wakes up once to use bathroom around 3am. Denies fever, cough, chest tightness, shortness of breath or wheezing.   NPSG- 05/26/12- AHI 17.6/ hr  Moderate obstructive sleep apnea, body weight 176 pounds  Allergies  Allergen Reactions  . Penicillins Rash    Fine red bumps that spread all over the body.  . Skin Adhesives [Cyanoacrylate] Itching and Rash    Dermabond    Immunization History  Administered Date(s) Administered  . Hepatitis A, Adult 08/18/2017  . Influenza Split 06/07/2012, 05/22/2013  . Influenza, High Dose Seasonal PF 04/21/2018  . Influenza-Unspecified 06/20/2014  . Zoster 06/20/2014    Past Medical History:  Diagnosis Date  . Anemia   . Blood transfusion 2005  . Hernia   . HH (hiatus hernia)   . Hyperlipidemia   . Hypertension   . PAH (pulmonary artery hypertension) ([place])   . Sarcoidosis   . Shortness of breath    on exertion  . Sleep apnea    on o2 at night  . Wheezing     Tobacco History: Social History   Tobacco Use  Smoking Status Never Smoker  Smokeless Tobacco Never Used   Counseling given: Not  Answered   Outpatient Medications Prior to Visit  Medication Sig Dispense Refill  . Cholecalciferol (VITAMIN D) 2000 UNITS CAPS Take 1 capsule by mouth daily.    . Coenzyme Q10 200 MG TABS Take 200 mg by mouth daily.    Marland Kitchen ketotifen (ALAWAY) 0.025 % ophthalmic solution Place 1 drop into both eyes 2 (two) times daily.    Marland Kitchen omeprazole (PRILOSEC) 20 MG capsule Take 40 mg by mouth every morning.    . Polyvinyl Alcohol-Povidone PF (REFRESH) 1.4-0.6 % SOLN Apply to eye daily as needed for dry eyes.    . potassium gluconate 595 MG TABS Take 595 mg by mouth daily.     . simvastatin (ZOCOR) 20 MG tablet Take 20 mg by mouth every morning.     . triamterene-hydrochlorothiazide (MAXZIDE-25) 37.5-25 MG per tablet Take 1 tablet by mouth every morning.     . vitamin C (ASCORBIC ACID) 500 MG tablet Take 1,000 mg by mouth daily.    . Carboxymethylcellul-Glycerin (OPTIVE) 0.5-0.9 % SOLN Apply 1 drop to eye 3 (three) times daily as needed. Dry eyes    . Magnesium 500 MG TABS Take 500 mg by mouth daily.    Marland Kitchen NEXIUM 40 MG capsule      No facility-administered medications prior to visit.     Review of Systems  Review of Systems  Constitutional: Negative.  HENT: Negative.   Respiratory: Negative.   Cardiovascular: Negative.   Skin: Negative.     Physical Exam  BP 102/76 (BP Location: Left Arm, Cuff Size: Normal)   Pulse 87   Temp 98 F (36.7 C)   Ht _0  (1.499 m)   Wt 170 lb 3.2 oz (77.2 kg)   SpO2 93%   BMI 34.38 kg/m  Physical Exam  Constitutional: She is oriented to person, place, and time. She appears well-developed and well-nourished. No distress.  HENT:  Head: Normocephalic and atraumatic.  Eyes: Pupils are equal, round, and reactive to light. EOM are normal.  Neck: Normal range of motion. Neck supple.  Cardiovascular: Normal rate and regular rhythm.  Pulmonary/Chest: Effort normal and breath sounds normal. No stridor. No respiratory distress. She has no wheezes. She has no rales.  She exhibits no tenderness.  CTA  Musculoskeletal: Normal range of motion.  Neurological: She is alert and oriented to person, place, and time.  Skin: Skin is warm and dry.  Psychiatric: She has a normal mood and affect. Her behavior is normal. Judgment and thought content normal.     Lab Results:  CBC    Component Value Date/Time   WBC 5.2 06/09/2018 1239   WBC 7.5 11/22/2012 0100   RBC 4.34 06/09/2018 1239   HGB 13.0 06/09/2018 1239   HCT 39.7 06/09/2018 1239   PLT 217 06/09/2018 1239   MCV 91.5 06/09/2018 1239   MCH 30.0 06/09/2018 1239   MCHC 32.7 06/09/2018 1239   RDW 12.8 06/09/2018 1239   LYMPHSABS 2.0 06/09/2018 1239   MONOABS 0.4 06/09/2018 1239   EOSABS 0.1 06/09/2018 1239   BASOSABS 0.0 06/09/2018 1239    BMET    Component Value Date/Time   NA 141 06/09/2018 1239   K 4.6 06/09/2018 1239   CL 106 06/09/2018 1239   CO2 28 06/09/2018 1239   GLUCOSE 97 06/09/2018 1239   BUN 17 06/09/2018 1239   CREATININE 1.27 (H) 06/09/2018 1239   CALCIUM 10.0 06/09/2018 1239   GFRNONAA 41 (L) 06/09/2018 1239   GFRAA 47 (L) 06/09/2018 1239    BNP No results found for: BNP  ProBNP No results found for: PROBNP  Imaging: US Breast Ltd Uni Right Inc Axilla  Result Date: 06/02/2018 CLINICAL DATA:  72 year old female recalled from screening mammogram dated 05/26/2018 for possible right breast mass with calcifications and asymmetry. EXAM: DIGITAL DIAGNOSTIC RIGHT MAMMOGRAM WITH CAD AND TOMO ULTRASOUND RIGHT BREAST COMPARISON:  Previous exam(s). ACR Breast Density Category c: The breast tissue is heterogeneously dense, which may obscure small masses. FINDINGS: There is a persistent asymmetry with associated coarse heterogeneous calcifications in the lower outer aspect of the right breast at middle depth. An additional spiculated mass persists in the lateral right breast at middle depth, best seen on the CC projection. This localizes centrally on tomosynthesis. Further evaluation  of both areas was performed with ultrasound. Mammographic images were processed with CAD. Targeted ultrasound is performed, showing an irregular hypoechoic mass at the 10 o'clock position 8 cm from the nipple. It measures 10 x 7 x 6 mm. There is no definite associated vascularity. Just adjacent to this mass, there is an irregular hypoechoic mass with punctate echogenic foci. It measures 9 x 8 x 3 mm. An additional hypoechoic mass with echogenic foci is demonstrated at the 9 o'clock position 6 cm from the nipple. It measures 6 x 4 x 4 mm. There is suggestion of associated vascularity. Evaluation of the right  axilla demonstrates no suspicious lymphadenopathy. IMPRESSION: 1. Suspicious mass at the 10 o'clock position 8 cm from the nipple scratch. Recommendation is for ultrasound-guided biopsy. 2. Additional indeterminate mass at the 9 o'clock position 6 cm from the nipple. Recommendation is for ultrasound-guided biopsy. 3. No suspicious right axillary lymphadenopathy. RECOMMENDATION: Two area ultrasound-guided biopsy of the right breast. This was performed to follow. Please see additional dictation. I have discussed the findings and recommendations with the patient. Results were also provided in writing at the conclusion of the visit. If applicable, a reminder letter will be sent to the patient regarding the next appointment. BI-RADS CATEGORY  4: Suspicious. Electronically Signed   By: Kristopher Oppenheim M.D.   On: 06/02/2018 16:54   Mm Diag Breast Tomo Uni Right  Result Date: 06/02/2018 CLINICAL DATA:  72 year old female recalled from screening mammogram dated 05/26/2018 for possible right breast mass with calcifications and asymmetry. EXAM: DIGITAL DIAGNOSTIC RIGHT MAMMOGRAM WITH CAD AND TOMO ULTRASOUND RIGHT BREAST COMPARISON:  Previous exam(s). ACR Breast Density Category c: The breast tissue is heterogeneously dense, which may obscure small masses. FINDINGS: There is a persistent asymmetry with associated coarse  heterogeneous calcifications in the lower outer aspect of the right breast at middle depth. An additional spiculated mass persists in the lateral right breast at middle depth, best seen on the CC projection. This localizes centrally on tomosynthesis. Further evaluation of both areas was performed with ultrasound. Mammographic images were processed with CAD. Targeted ultrasound is performed, showing an irregular hypoechoic mass at the 10 o'clock position 8 cm from the nipple. It measures 10 x 7 x 6 mm. There is no definite associated vascularity. Just adjacent to this mass, there is an irregular hypoechoic mass with punctate echogenic foci. It measures 9 x 8 x 3 mm. An additional hypoechoic mass with echogenic foci is demonstrated at the 9 o'clock position 6 cm from the nipple. It measures 6 x 4 x 4 mm. There is suggestion of associated vascularity. Evaluation of the right axilla demonstrates no suspicious lymphadenopathy. IMPRESSION: 1. Suspicious mass at the 10 o'clock position 8 cm from the nipple scratch. Recommendation is for ultrasound-guided biopsy. 2. Additional indeterminate mass at the 9 o'clock position 6 cm from the nipple. Recommendation is for ultrasound-guided biopsy. 3. No suspicious right axillary lymphadenopathy. RECOMMENDATION: Two area ultrasound-guided biopsy of the right breast. This was performed to follow. Please see additional dictation. I have discussed the findings and recommendations with the patient. Results were also provided in writing at the conclusion of the visit. If applicable, a reminder letter will be sent to the patient regarding the next appointment. BI-RADS CATEGORY  4: Suspicious. Electronically Signed   By: Kristopher Oppenheim M.D.   On: 06/02/2018 16:54   Mm Screening Breast Tomo Bilateral  Result Date: 05/27/2018 CLINICAL DATA:  Screening. EXAM: DIGITAL SCREENING BILATERAL MAMMOGRAM WITH TOMO AND CAD COMPARISON:  Previous exam(s). ACR Breast Density Category c: The breast  tissue is heterogeneously dense, which may obscure small masses. FINDINGS: In the right breast, a possible mass and calcifications along with adjacent asymmetry warrants further evaluation. In the left breast, no findings suspicious for malignancy. Images were processed with CAD. IMPRESSION: Further evaluation is suggested for possible mass and calcifications along with adjacent asymmetry in the right breast. RECOMMENDATION: Diagnostic mammogram and possibly ultrasound of the right breast. (Code:[id number]) The patient will be contacted regarding the findings, and additional imaging will be scheduled. BI-RADS CATEGORY  0: Incomplete. Need additional imaging evaluation and/or prior mammograms for  comparison. Electronically Signed   By: Lovey Newcomer M.D.   On: 05/27/2018 12:40   Mm Clip Placement Right  Result Date: 06/02/2018 CLINICAL DATA:  Evaluate placement of biopsy clips following 2 ultrasound-guided RIGHT breast biopsies. EXAM: DIAGNOSTIC RIGHT MAMMOGRAM POST ULTRASOUND BIOPSY COMPARISON:  Previous exam(s). FINDINGS: Mammographic images were obtained following ultrasound guided biopsy of the 1 cm mass at the 10 o'clock position of the RIGHT breast and the 0.6 cm mass at the 9 o'clock position of the RIGHT breast. The RIBBON shaped clip is in satisfactory position corresponding to the 1 cm mass biopsied at the 10 o'clock position. The COIL shaped clip lies 7 mm posterior/lateral/superior to the 0.6 cm mass biopsied at the 9 o'clock position. The 2 clips are separated by a distance of 4 cm craniocaudally. The areas biopsied by ultrasound guidance correspond to the findings identified mammographically. IMPRESSION: Satisfactory RIBBON clip position following ultrasound-guided biopsy of the 1 cm UPPER-OUTER RIGHT breast mass. COIL clip position located 7 mm from the 0.6 cm mass biopsied within the OUTER RIGHT breast. Final Assessment: Post Procedure Mammograms for Marker Placement Electronically Signed   By:  Margarette Canada M.D.   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 This was found to be concordant by Dr. Hassan Rowan. Pathology revealed BENIGN BREAST TISSUE SHOWING SCLEROTIC LESION WITH CALCIFICATIONS of the RIGHT breast, outer, 9 o'clock position (coil clip). Differential diagnosis can include a sclerosed papilloma. This was found to be concordant by Dr. Hassan Rowan, with excision recommended. Pathology results were discussed with the patient by telephone. The patient reported doing well after the biopsies with tenderness at the sites. Post biopsy instructions and care were reviewed and questions were answered. The patient was encouraged to call [facility] for any additional concerns. The patient was referred to [facility] on June 09, 2018. Pathology results reported by Terie Purser, RN on 06/04/2018. Electronically Signed   By: Margarette Canada M.D.   On: 06/04/2018 08:19   Result Date: 06/04/2018 CLINICAL DATA:  72 year old female for tissue sampling of 1 cm UPPER-OUTER RIGHT breast mass and 0.6 cm OUTER RIGHT breast mass. EXAM: ULTRASOUND GUIDED RIGHT BREAST CORE NEEDLE BIOPSY X 2 COMPARISON:  Previous exam(s). FINDINGS: I met with the patient and we discussed the procedure of ultrasound-guided biopsy, including benefits and alternatives. We discussed the high likelihood of a successful procedure. We discussed the risks of the procedure, including infection, bleeding, tissue injury, clip migration, and inadequate sampling. Informed written consent was given. The usual time-out protocol was performed immediately prior to the procedure.  ULTRASOUND-GUIDED UPPER-OUTER RIGHT BREAST MASS BIOPSY (RIBBON clip): Lesion quadrant: UPPER-OUTER RIGHT BREAST Using sterile technique and 1% Lidocaine as local anesthetic, under direct ultrasound visualization, a 12 gauge spring-loaded device was used to perform biopsy of 1 cm hypoechoic mass at the 10 o'clock position of the RIGHT breast using a LATERAL approach. At the conclusion of the procedure a RIBBON tissue marker clip was deployed into the biopsy cavity. Follow up 2 view mammogram was performed and dictated separately. ULTRASOUND-GUIDED OUTER RIGHT BREAST MASS BIOPSY (COIL clip): Lesion quadrant: OUTER RIGHT BREAST Using sterile technique and 1%  Lidocaine as local anesthetic, under direct ultrasound visualization, a 12 gauge spring-loaded device was used to perform biopsy of 0.6 cm hypoechoic mass at the 9 o'clock position of the RIGHT breast using a LATERAL approach. At the conclusion of the procedure a COIL tissue marker clip was deployed into the biopsy cavity. Follow up 2 view mammogram was performed and dictated separately. IMPRESSION: Ultrasound guided biopsies of the 1 cm mass at the 10 o'clock position of the RIGHT breast and the 0.6 cm mass at the 9 o'clock position of the RIGHT breast. No apparent complications. Electronically Signed: By: Margarette Canada M.D. On: 06/02/2018 16:30   [place] Rt Breast Bx W Loc Dev Ea Add Lesion Img Bx Spec US Guide  Addendum Date: 06/04/2018   ADDENDUM REPORT: 06/04/2018 08:19 ADDENDUM: Pathology revealed GRADE II INVASIVE DUCTAL CARCINOMA, INTERMEDIATE NUCLEAR GRADE DUCTAL CARCINOMA IN SITU of the RIGHT breast, 10 o'clock position (ribbon clip). This was found to be concordant by Dr. Hassan Rowan. Pathology revealed BENIGN BREAST TISSUE SHOWING SCLEROTIC LESION WITH CALCIFICATIONS of the RIGHT breast, outer, 9 o'clock position (coil clip). Differential diagnosis can include a sclerosed papilloma. This was found to be concordant by Dr. Hassan Rowan, with excision recommended.  Pathology results were discussed with the patient by telephone. The patient reported doing well after the biopsies with tenderness at the sites. Post biopsy instructions and care were reviewed and questions were answered. The patient was encouraged to call [facility] for any additional concerns. The patient was referred to [facility] on June 09, 2018. Pathology results reported by Terie Purser, RN on 06/04/2018. Electronically Signed   By: Margarette Canada M.D.   On: 06/04/2018 08:19   Result Date: 06/04/2018 CLINICAL DATA:  72 year old female for tissue sampling of 1 cm UPPER-OUTER RIGHT breast mass and 0.6 cm OUTER RIGHT breast mass. EXAM: ULTRASOUND GUIDED RIGHT BREAST CORE NEEDLE BIOPSY X 2 COMPARISON:  Previous exam(s). FINDINGS: I met with the patient and we discussed the procedure of ultrasound-guided biopsy, including benefits and alternatives. We discussed the high likelihood of a successful procedure. We discussed the risks of the procedure, including infection, bleeding, tissue injury, clip migration, and inadequate sampling. Informed written consent was given. The usual time-out protocol was performed immediately prior to the procedure. ULTRASOUND-GUIDED UPPER-OUTER RIGHT BREAST MASS BIOPSY (RIBBON clip): Lesion quadrant: UPPER-OUTER RIGHT BREAST Using sterile technique and 1% Lidocaine as local anesthetic, under direct ultrasound visualization, a 12 gauge spring-loaded device was used to perform biopsy of 1 cm hypoechoic mass at the 10 o'clock position of the RIGHT breast using a LATERAL approach. At the conclusion of the procedure a RIBBON tissue marker clip was deployed into the biopsy cavity. Follow up 2 view mammogram was performed and dictated separately. ULTRASOUND-GUIDED OUTER RIGHT BREAST MASS BIOPSY (COIL clip): Lesion quadrant: OUTER RIGHT BREAST Using sterile technique and 1% Lidocaine  as local anesthetic, under direct ultrasound visualization, a 12 gauge spring-loaded device was used to perform biopsy of 0.6 cm hypoechoic mass at the 9 o'clock position of the RIGHT breast using a LATERAL approach. At the conclusion of the procedure a COIL tissue marker clip was deployed into the biopsy cavity. Follow up 2 view mammogram was performed and dictated separately. IMPRESSION: Ultrasound guided biopsies of the 1 cm mass at the 10 o'clock position of the RIGHT breast and the 0.6 cm mass at the 9 o'clock position of the RIGHT breast. No  apparent complications. Electronically Signed: By: Margarette Canada M.D. On: 06/02/2018 16:30     Assessment & Plan:   Sarcoidosis - Stable - Spirometry today showed no evidence of airway restriction or obstruction  - Ambulatory O2 sat after 2 laps 93% room air    Pre-op evaluation - Cleared from a pulmonary standpoint for breast lumpectomy with Dr. [name]  Major Pulmonary risks identified in the multifactorial risk analysis are but not limited to a) pneumonia; b) recurrent intubation risk; c) prolonged or recurrent acute respiratory failure needing mechanical ventilation; d) prolonged hospitalization; e) DVT/Pulmonary embolism; f) Acute Pulmonary edema  Recommend 1. Short duration of surgery as much as possible and avoid paralytic if possible 2. DVT prophylaxis 3. Aggressive pulmonary toilet with o2, bronchodilatation, and incentive spirometry and early ambulation     Martyn Ehrich, NP 06/24/2018

## 2018-06-24 DIAGNOSIS — Z01818 Encounter for other preprocedural examination: Secondary | ICD-10-CM | POA: Insufficient documentation

## 2018-06-24 NOTE — Assessment & Plan Note (Signed)
-   Cleared from a pulmonary standpoint for breast lumpectomy with Dr. [name]  Major Pulmonary risks identified in the multifactorial risk analysis are but not limited to a) pneumonia; b) recurrent intubation risk; c) prolonged or recurrent acute respiratory failure needing mechanical ventilation; d) prolonged hospitalization; e) DVT/Pulmonary embolism; f) Acute Pulmonary edema  Recommend 1. Short duration of surgery as much as possible and avoid paralytic if possible 2. DVT prophylaxis 3. Aggressive pulmonary toilet with o2, bronchodilatation, and incentive spirometry and early ambulation

## 2018-06-24 NOTE — Assessment & Plan Note (Addendum)
-   Stable - Spirometry today showed no evidence of airway restriction or obstruction  - Ambulatory O2 sat after 2 laps 93% room air

## 2018-06-25 ENCOUNTER — Ambulatory Visit: Payer: Medicare Other | Admitting: Oncology

## 2018-06-28 ENCOUNTER — Encounter (HOSPITAL_COMMUNITY): Payer: Self-pay

## 2018-06-28 NOTE — Pre-Procedure Instructions (Signed)
Rachel Kelly  06/28/2018      [facility] ([facility] [place] [address] [place] ([place]) [place] [address] Phone: [phone] Fax: [phone]    Your procedure is scheduled on Friday, December 13th.  Report to [facility] at 7:00 A.M.  Call this number if you have problems the morning of surgery:  [phone]   Remember:  Do not eat after midnight.  You may drink clear liquids until 6:00 A.M .  Clear liquids allowed are: Water, Juice (non-citric and without pulp), Carbonated beverages, Clear Tea, Black Coffee only and Gatorade    Take these medicines the morning of surgery with A SIP OF WATER  loperamide (IMODIUM A-D)-as needed. omeprazole (PRILOSEC) simvastatin (ZOCOR) Eye drops.   As of today, STOP taking any Aspirin(unless otherwise instructed by your surgeon), Aleve, Naproxen, Ibuprofen, Motrin, Advil, Goody's, BC's, all herbal medications, fish oil, and all vitamins    Do not wear jewelry, make-up or nail polish.  Do not wear lotions, powders, or perfumes, or deodorant.  Do not shave 48 hours prior to surgery.   Do not bring valuables to the hospital.  [facility] is not responsible for any belongings or valuables.  Contacts, dentures or bridgework may not be worn into surgery.  Leave your suitcase in the car.  After surgery it may be brought to your room.  For patients admitted to the hospital, discharge time will be determined by your treatment team.  Patients discharged the day of surgery will not be allowed to drive home.   Special instructions:   [place]- Preparing For Surgery  Before surgery, you can play an important role. Because skin is not sterile, your skin needs to be as free of germs as possible. You can reduce the number of germs on your skin by washing with CHG (chlorahexidine gluconate) Soap before surgery.  CHG is an antiseptic cleaner which kills germs and bonds with the skin to  continue killing germs even after washing.    Oral Hygiene is also important to reduce your risk of infection.  Remember - BRUSH YOUR TEETH THE MORNING OF SURGERY WITH YOUR REGULAR TOOTHPASTE  Please do not use if you have an allergy to CHG or antibacterial soaps. If your skin becomes reddened/irritated stop using the CHG.  Do not shave (including legs and underarms) for at least 48 hours prior to first CHG shower. It is OK to shave your face.  Please follow these instructions carefully.   1. Shower the NIGHT BEFORE SURGERY and the MORNING OF SURGERY with CHG.   2. If you chose to wash your hair, wash your hair first as usual with your normal shampoo.  3. After you shampoo, rinse your hair and body thoroughly to remove the shampoo.  4. Use CHG as you would any other liquid soap. You can apply CHG directly to the skin and wash gently with a scrungie or a clean washcloth.   5. Apply the CHG Soap to your body ONLY FROM THE NECK DOWN.  Do not use on open wounds or open sores. Avoid contact with your eyes, ears, mouth and genitals (private parts). Wash Face and genitals (private parts)  with your normal soap.  6. Wash thoroughly, paying special attention to the area where your surgery will be performed.  7. Thoroughly rinse your body with warm water from the neck down.  8. DO NOT shower/wash with your normal soap after using  and rinsing off the CHG Soap.  9. Pat yourself dry with a CLEAN TOWEL.  10. Wear CLEAN PAJAMAS to bed the night before surgery, wear comfortable clothes the morning of surgery  11. Place CLEAN SHEETS on your bed the night of your first shower and DO NOT SLEEP WITH PETS.    Day of Surgery:  Do not apply any deodorants/lotions.  Please wear clean clothes to the hospital/surgery center.   Remember to brush your teeth WITH YOUR REGULAR TOOTHPASTE.   Please read over the following fact sheets that you were given.

## 2018-06-29 ENCOUNTER — Other Ambulatory Visit: Payer: Self-pay

## 2018-06-29 ENCOUNTER — Encounter (HOSPITAL_COMMUNITY)
Admission: RE | Admit: 2018-06-29 | Discharge: 2018-06-29 | Disposition: A | Discharge status: Home / Self Care | Payer: Medicare Other | Source: Ambulatory Visit | Attending: General Surgery | Admitting: General Surgery

## 2018-06-29 ENCOUNTER — Encounter (HOSPITAL_COMMUNITY): Payer: Self-pay

## 2018-06-29 DIAGNOSIS — K219 Gastro-esophageal reflux disease without esophagitis: Secondary | ICD-10-CM | POA: Diagnosis not present

## 2018-06-29 DIAGNOSIS — C50911 Malignant neoplasm of unspecified site of right female breast: Secondary | ICD-10-CM

## 2018-06-29 DIAGNOSIS — Z01818 Encounter for other preprocedural examination: Secondary | ICD-10-CM

## 2018-06-29 DIAGNOSIS — I444 Left anterior fascicular block: Secondary | ICD-10-CM

## 2018-06-29 DIAGNOSIS — I498 Other specified cardiac arrhythmias: Secondary | ICD-10-CM | POA: Insufficient documentation

## 2018-06-29 DIAGNOSIS — Z79899 Other long term (current) drug therapy: Secondary | ICD-10-CM | POA: Diagnosis not present

## 2018-06-29 DIAGNOSIS — Z171 Estrogen receptor negative status [ER-]: Secondary | ICD-10-CM | POA: Diagnosis not present

## 2018-06-29 DIAGNOSIS — E7800 Pure hypercholesterolemia, unspecified: Secondary | ICD-10-CM | POA: Diagnosis not present

## 2018-06-29 DIAGNOSIS — C50411 Malignant neoplasm of upper-outer quadrant of right female breast: Secondary | ICD-10-CM | POA: Diagnosis present

## 2018-06-29 DIAGNOSIS — I10 Essential (primary) hypertension: Secondary | ICD-10-CM | POA: Diagnosis not present

## 2018-06-29 LAB — CBC
HCT: 40.6 % (ref 36.0–46.0)
HEMOGLOBIN: 12.8 g/dL (ref 12.0–15.0)
MCH: 29 pg (ref 26.0–34.0)
MCHC: 31.5 g/dL (ref 30.0–36.0)
MCV: 92.1 fL (ref 80.0–100.0)
Platelets: 196 10*3/uL (ref 150–400)
RBC: 4.41 MIL/uL (ref 3.87–5.11)
RDW: 12.7 % (ref 11.5–15.5)
WBC: 4.3 10*3/uL (ref 4.0–10.5)
nRBC: 0 % (ref 0.0–0.2)

## 2018-06-29 LAB — BASIC METABOLIC PANEL
Anion gap: 10 (ref 5–15)
BUN: 16 mg/dL (ref 8–23)
CO2: 24 mmol/L (ref 22–32)
Calcium: 9.4 mg/dL (ref 8.9–10.3)
Chloride: 103 mmol/L (ref 98–111)
Creatinine, Ser: 1.27 mg/dL — ABNORMAL HIGH (ref 0.44–1.00)
GFR calc Af Amer: 48 mL/min — ABNORMAL LOW (ref >60)
GFR calc non Af Amer: 42 mL/min — ABNORMAL LOW (ref >60)
Glucose, Bld: 130 mg/dL — ABNORMAL HIGH (ref 70–99)
POTASSIUM: 3.7 mmol/L (ref 3.5–5.1)
Sodium: 137 mmol/L (ref 135–145)

## 2018-06-29 NOTE — Progress Notes (Addendum)
PCP: [name] ,MD  Cardiologist: pt denies  EKG: 06/29/18 in EPIC  Stress test: pt denies  ECHO: 06/11/18  Cardiac Cath: pt denies  Chest x-ray: pt denies past year, no recent respiratory infections/complications

## 2018-06-30 NOTE — Anesthesia Preprocedure Evaluation (Addendum)
Anesthesia Evaluation  Patient identified by MRN, date of birth, ID band Patient awake    Reviewed: Allergy & Precautions, NPO status , Patient's Chart, lab work & pertinent test results  Airway Mallampati: III  TM Distance: >3 FB Neck ROM: Full    Dental no notable dental hx. (+) Teeth Intact   Pulmonary shortness of breath and with exertion, sleep apnea ,  Non compliant with CPAP Sarcoidosis   Pulmonary exam normal breath sounds clear to auscultation       Cardiovascular hypertension, Pt. on medications Normal cardiovascular exam Rhythm:Regular Rate:Normal  EKG - NSR, LAFB, unchanged from 2014   Neuro/Psych negative neurological ROS  negative psych ROS   GI/Hepatic Neg liver ROS, hiatal hernia, GERD  ,  Endo/Other  Obesity Hyperlipidemia  Renal/GU Renal InsufficiencyRenal disease  negative genitourinary   Musculoskeletal negative musculoskeletal ROS (+)   Abdominal (+) + obese,   Peds  Hematology  (+) anemia , Hx/o Blood Transfusion 15 years ago back surgery   Anesthesia Other Findings   Reproductive/Obstetrics                            Anesthesia Physical Anesthesia Plan  ASA: III  Anesthesia Plan: General   Post-op Pain Management:  Regional for Post-op pain   Induction: Intravenous  PONV Risk Score and Plan: 4 or greater and Ondansetron, Dexamethasone and Treatment may vary due to age or medical condition  Airway Management Planned: LMA  Additional Equipment:   Intra-op Plan:   Post-operative Plan: Extubation in OR  Informed Consent: I have reviewed the patients History and Physical, chart, labs and discussed the procedure including the risks, benefits and alternatives for the proposed anesthesia with the patient or authorized representative who has indicated his/her understanding and acceptance.   Dental advisory given  Plan Discussed with: CRNA and  Surgeon  Anesthesia Plan Comments: (Pulm clearance by Geraldo Pitter, NP 06/21/2018: "Sarcoidosis: Stable. Spirometry today showed no evidence of airway restriction or obstruction. Ambulatory O2 sat after 2 laps 93% room air. Pre-op evaluation: Cleared from a pulmonary standpoint for breast lumpectomy with Dr. [name]")       Anesthesia Quick Evaluation

## 2018-07-01 ENCOUNTER — Telehealth: Payer: Self-pay | Admitting: Oncology

## 2018-07-01 ENCOUNTER — Ambulatory Visit
Admission: RE | Admit: 2018-07-01 | Discharge: 2018-07-01 | Disposition: A | Discharge status: Home / Self Care | Payer: Medicare Other | Source: Ambulatory Visit | Attending: General Surgery | Admitting: General Surgery

## 2018-07-01 DIAGNOSIS — C50411 Malignant neoplasm of upper-outer quadrant of right female breast: Secondary | ICD-10-CM

## 2018-07-01 DIAGNOSIS — Z171 Estrogen receptor negative status [ER-]: Principal | ICD-10-CM

## 2018-07-01 NOTE — H&P (Signed)
History of Present Illness Rachel Kitchen T. Knoxx Boeding MD; 06/09/2018 3:22 PM) The patient is a 72 year old female who presents with breast cancer. She is a postmenopausal female referred by Dr. Margarette Canada for evaluation of recently diagnosed carcinoma of the right breast. She recently presented for a screening mamogram revealing a possible mass and calcifications with adjacent asymmetry in the right breast. it had been about 2 years since her previous screening mammogram.Subsequent imaging included diagnostic mamogram showing a persistent asymmetry and associated calcifications lower outer right breast middle depth as well as a spiculated mass in the lateral breast middle depth, and ultrasound showing an irregular hypoechoic mass at the 10:00 position 8 cm from the nipple measuring 10 x 7 mm and just adjacent to this mass a second mass or extension measuring 9 mm. An additional 6 mm hypoechoic mass is demonstrated at the 9:00 position 6 cm from the nipple gnosis suspicious adenopathy.. 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 Review of imaging today indicates the 2 adjacent masses at the 10:00 position are essentially on top of each other. She is seen now in breast multidisciplinary clinic for initial treatment planning. She has experienced no breast symptoms, specifically lump or pain or discharge. She does not have a personal history of any previous breast problems.  Findings at that time were the following: Tumor size: 1 cm and 0.9 cm adjacent at the 10:00 position and 6 mm papilloma at the 9:00 position Tumor grade: 2 Estrogen Receptor: negative Progesterone Receptor: negative Her-2 neu: negative Lymph node status: negative    Problem List/Past Medical Rachel Kitchen T. Quadasia Newsham, MD; 06/09/2018 3:31 PM) DIFFICULT BOWEL MOVEMENTS (K59.00)   VOMITING IN ADULT (R11.10)  MALIGNANT NEOPLASM OF UPPER-OUTER QUADRANT OF RIGHT BREAST IN FEMALE, ESTROGEN RECEPTOR NEGATIVE (C50.411)   Past Surgical History Rachel Kitchen T. Riki Gehring, MD; 06/09/2018 3:31 PM) Cataract Surgery  Bilateral. Colon Polyp Removal - Colonoscopy  Foot Surgery  Bilateral. Hysterectomy (not due to cancer) - Partial  Spinal Surgery - Lower Back   Diagnostic Studies History Rachel Kitchen T. Liseth Wann, MD; 06/09/2018 3:31 PM) Colonoscopy  1-5 years ago Mammogram  within last year Pap Smear  1-5 years ago  Allergies Rachel Kitchen T. Zadrian Mccauley, MD; 06/09/2018 3:31 PM) Dorma Russell Bandages *MEDICAL DEVICES AND SUPPLIES*  PenicillAMINE *ASSORTED CLASSES*   Medication History Rachel Kitchen T. Nyella Eckels, MD; 06/09/2018 3:31 PM) NexIUM (40MG Capsule DR, Oral) Active. Triamterene-HCTZ (37.5-25MG Capsule, Oral) Active. Vitamin D (Cholecalciferol) (1000UNIT Capsule, Oral) Active. Potassium (75MG Tablet, Oral) Active. Simvastatin (20MG Tablet, Oral) Active. Magnesium (100MG Capsule, Oral) Active. Align (4MG Capsule, Oral) Active. Co Q 10 (10MG Capsule, Oral) Active. Potassium Bicarbonate (25MEQ Tablet Effer, Oral) Active.  Social History Rachel Kitchen T. Ridgely Anastacio, MD; 06/09/2018 3:31 PM) Caffeine use  Carbonated beverages, Tea. No alcohol use  No drug use  Tobacco use  Never smoker.  Family History Rachel Kitchen T. Yamil Dougher, MD; 06/09/2018 3:31 PM) Alcohol Abuse  Brother. Arthritis  Mother. Heart Disease  Father. Hypertension  Brother, Father, Mother.  Pregnancy / Birth History Rachel Kitchen T. Nylen Creque, MD; 06/09/2018 3:31 PM) Age at menarche  57 years. Contraceptive History  Oral contraceptives. Gravida  1 Irregular periods  Maternal age  83-25 Para  73  Other Problems Rachel Kitchen T. Jerric Oyen, MD; 06/09/2018 3:31 PM) Back Pain  Bladder Problems  Gastroesophageal Reflux Disease  High blood pressure  Hypercholesterolemia  Kidney Stone   Oophorectomy  Right.  Physical Exam Rachel Kitchen T. Thelbert Gartin MD; 06/09/2018 3:23 PM) The physical exam findings are as follows: Note:General: Alert, pleasant moderately obese Afro-American female, in no distress Skin: Warm and dry without rash or infection. HEENT: No palpable masses or thyromegaly. Sclera nonicteric. Pupils equal round and reactive. Lymph nodes: No cervical, supraclavicular, nodes palpable. Breasts: D cup bilaterally. No palpable masses in either breast. No palpable axillary adenopathy. Lungs: Breath sounds clear and equal. No wheezing or increased work of breathing. Cardiovascular: Regular rate and rhythm without murmer. No JVD or edema. Peripheral pulses intact. Abdomen: Nondistended. Soft and nontender. No masses palpable. No organomegaly. No palpable hernias. Extremities: No edema or joint swelling or deformity. Neurologic: Alert and fully oriented. Gait normal. No focal weakness. Psychiatric: Normal mood and affect. Thought content appropriate with normal judgement and insight    Assessment & Plan Rachel Kitchen T. Christapher Gillian MD; 06/09/2018 3:27 PM) MALIGNANT NEOPLASM OF UPPER-OUTER QUADRANT OF RIGHT BREAST IN FEMALE, ESTROGEN RECEPTOR NEGATIVE (C50.411) Impression: 72 year old female with a new diagnosis of cancer of the right breast, upper outer quadrant. Clinical stage Ib, triple negative. Additional apparent sclerosing benign papilloma about 4 cm distant from this.I discussed with the patient and her daughters present today initial surgical treatment options. We discussed options of breast conservation with lumpectomy or total mastectomy and sentinal lymph node biopsy/dissection. After discussion they have elected to proceed with breast conservation with lumpectomy and axillary sentinel lymph node biopsy. would also plan to target and removed the area of the papilloma.We discussed the indications and nature of the procedure, and expected recovery, in detail. Surgical  risks including anesthetic complications, cardiorespiratory complications, bleeding, infection, wound healing complications, blood clots, lymphedema, local and distant recurrence and possible need for further surgery based on the final pathology was discussed and understood. Chemotherapy, hormonal therapy and radiation therapy have been discussed. They have been provided with literature regarding the treatment of breast cancer. she will also require Port-A-Cath placement. I discussed this procedure in detail including risks of pneumothorax, vascular injury and long-term risks of infection, occlusion, displacement and DVT. All questions were answered. They understand and agree to proceed and we will go ahead with scheduling. Current Plans radioactive seed localized right breast lumpectomy 2( malignancy and papilloma), axillary sentinel lymph node biopsy and placement of Port-A-Cath

## 2018-07-01 NOTE — Telephone Encounter (Signed)
Returned call to patient re moving 12/20 f/u with GM to 12/19 or 12/23. GM has no availability 12/19 or 12/24 - next available 12/24 and 12/31. Left message for patient with above information and also confirmed 12/20 f/u and 12/23 ched remain as scheduled. Patient asked to return call is she still cannot keep 12/20 f/u.

## 2018-07-02 ENCOUNTER — Ambulatory Visit (HOSPITAL_COMMUNITY): Payer: Medicare Other

## 2018-07-02 ENCOUNTER — Ambulatory Visit (HOSPITAL_COMMUNITY): Payer: Medicare Other | Admitting: Anesthesiology

## 2018-07-02 ENCOUNTER — Encounter (HOSPITAL_COMMUNITY): Payer: Self-pay | Admitting: *Deleted

## 2018-07-02 ENCOUNTER — Inpatient Hospital Stay (HOSPITAL_COMMUNITY)
Admission: RE | Admit: 2018-07-02 | Discharge: 2018-07-02 | Disposition: A | Discharge status: Home / Self Care | Payer: Medicare Other | Source: Ambulatory Visit | Attending: General Surgery | Admitting: General Surgery

## 2018-07-02 ENCOUNTER — Ambulatory Visit
Admission: RE | Admit: 2018-07-02 | Discharge: 2018-07-02 | Disposition: A | Discharge status: Home / Self Care | Payer: Medicare Other | Source: Ambulatory Visit | Attending: General Surgery | Admitting: General Surgery

## 2018-07-02 ENCOUNTER — Ambulatory Visit (HOSPITAL_COMMUNITY)
Admission: RE | Admit: 2018-07-02 | Discharge: 2018-07-02 | Disposition: A | Discharge status: Home / Self Care | Payer: Medicare Other | Attending: General Surgery | Admitting: General Surgery

## 2018-07-02 ENCOUNTER — Encounter (HOSPITAL_COMMUNITY)
Admission: RE | Disposition: A | Discharge status: Home / Self Care | Payer: Self-pay | Source: Home / Self Care | Attending: General Surgery

## 2018-07-02 ENCOUNTER — Ambulatory Visit (HOSPITAL_COMMUNITY): Payer: Medicare Other | Admitting: Physician Assistant

## 2018-07-02 DIAGNOSIS — C50411 Malignant neoplasm of upper-outer quadrant of right female breast: Secondary | ICD-10-CM

## 2018-07-02 DIAGNOSIS — I10 Essential (primary) hypertension: Secondary | ICD-10-CM | POA: Diagnosis not present

## 2018-07-02 DIAGNOSIS — Z171 Estrogen receptor negative status [ER-]: Secondary | ICD-10-CM | POA: Insufficient documentation

## 2018-07-02 DIAGNOSIS — K219 Gastro-esophageal reflux disease without esophagitis: Secondary | ICD-10-CM | POA: Insufficient documentation

## 2018-07-02 DIAGNOSIS — Z79899 Other long term (current) drug therapy: Secondary | ICD-10-CM | POA: Insufficient documentation

## 2018-07-02 DIAGNOSIS — E7800 Pure hypercholesterolemia, unspecified: Secondary | ICD-10-CM | POA: Diagnosis not present

## 2018-07-02 DIAGNOSIS — Z95828 Presence of other vascular implants and grafts: Secondary | ICD-10-CM

## 2018-07-02 HISTORY — PX: BREAST LUMPECTOMY: SHX2

## 2018-07-02 HISTORY — PX: PORTACATH PLACEMENT: SHX2246

## 2018-07-02 HISTORY — PX: BREAST LUMPECTOMY WITH RADIOACTIVE SEED AND SENTINEL LYMPH NODE BIOPSY: SHX6550

## 2018-07-02 SURGERY — BREAST LUMPECTOMY WITH RADIOACTIVE SEED AND SENTINEL LYMPH NODE BIOPSY
Anesthesia: General | Site: Chest | Laterality: Right

## 2018-07-02 MED ORDER — MIDAZOLAM HCL 2 MG/2ML IJ SOLN
INTRAMUSCULAR | Status: AC
  Administered 2018-07-02: 1 mg
  Filled 2018-07-02: qty 2

## 2018-07-02 MED ORDER — SODIUM BICARBONATE 4 % IV SOLN
INTRAVENOUS | Status: AC
  Filled 2018-07-02: qty 5

## 2018-07-02 MED ORDER — CIPROFLOXACIN IN D5W 400 MG/200ML IV SOLN
400.0000 mg | INTRAVENOUS | Status: AC
  Administered 2018-07-02: 400 mg via INTRAVENOUS
  Filled 2018-07-02: qty 200

## 2018-07-02 MED ORDER — ACETAMINOPHEN 500 MG PO TABS
1000.0000 mg | ORAL_TABLET | ORAL | Status: AC
  Administered 2018-07-02: 1000 mg via ORAL
  Filled 2018-07-02: qty 2

## 2018-07-02 MED ORDER — CHLORHEXIDINE GLUCONATE CLOTH 2 % EX PADS: 6.0000 | MEDICATED_PAD | Freq: Once | CUTANEOUS | Status: DC

## 2018-07-02 MED ORDER — PROPOFOL 10 MG/ML IV BOLUS
INTRAVENOUS | Status: AC
  Filled 2018-07-02: qty 20

## 2018-07-02 MED ORDER — MIDAZOLAM HCL 2 MG/2ML IJ SOLN: 1.0000 mg | Freq: Once | INTRAMUSCULAR | Status: DC

## 2018-07-02 MED ORDER — SODIUM CHLORIDE 0.9 % IV SOLN
INTRAVENOUS | Status: DC | PRN
  Administered 2018-07-02: 500 mL

## 2018-07-02 MED ORDER — FENTANYL CITRATE (PF) 100 MCG/2ML IJ SOLN
INTRAMUSCULAR | Status: AC
  Administered 2018-07-02: 50 ug
  Filled 2018-07-02: qty 2

## 2018-07-02 MED ORDER — TECHNETIUM TC 99M SULFUR COLLOID FILTERED: 1.0000 | Freq: Once | INTRAVENOUS | Status: DC | PRN

## 2018-07-02 MED ORDER — OXYCODONE HCL 5 MG PO TABS: 5.0000 mg | ORAL_TABLET | Freq: Once | ORAL | Status: DC | PRN

## 2018-07-02 MED ORDER — TRAMADOL HCL 50 MG PO TABS: 50.0000 mg | ORAL_TABLET | Freq: Four times a day (QID) | ORAL | 1 refills | Status: DC | PRN

## 2018-07-02 MED ORDER — SODIUM CHLORIDE 0.9 % IV SOLN
INTRAVENOUS | Status: DC | PRN
  Administered 2018-07-02: 30 ug/min via INTRAVENOUS

## 2018-07-02 MED ORDER — ONDANSETRON HCL 4 MG/2ML IJ SOLN
INTRAMUSCULAR | Status: DC | PRN
  Administered 2018-07-02: 4 mg via INTRAVENOUS

## 2018-07-02 MED ORDER — HEPARIN SOD (PORK) LOCK FLUSH 100 UNIT/ML IV SOLN
INTRAVENOUS | Status: DC | PRN
  Administered 2018-07-02: 500 [IU]

## 2018-07-02 MED ORDER — TECHNETIUM TC 99M SULFUR COLLOID FILTERED
1.0000 | Freq: Once | INTRAVENOUS | Status: AC | PRN
  Administered 2018-07-02: 1 via INTRADERMAL

## 2018-07-02 MED ORDER — FENTANYL CITRATE (PF) 100 MCG/2ML IJ SOLN: 25.0000 ug | INTRAMUSCULAR | Status: DC | PRN

## 2018-07-02 MED ORDER — FENTANYL CITRATE (PF) 250 MCG/5ML IJ SOLN
INTRAMUSCULAR | Status: AC
  Filled 2018-07-02: qty 5

## 2018-07-02 MED ORDER — FENTANYL CITRATE (PF) 100 MCG/2ML IJ SOLN: 50.0000 ug | Freq: Once | INTRAMUSCULAR | Status: DC

## 2018-07-02 MED ORDER — LIDOCAINE HCL (PF) 1 % IJ SOLN
INTRAMUSCULAR | Status: AC
  Filled 2018-07-02: qty 30

## 2018-07-02 MED ORDER — 0.9 % SODIUM CHLORIDE (POUR BTL) OPTIME
TOPICAL | Status: DC | PRN
  Administered 2018-07-02: 1000 mL

## 2018-07-02 MED ORDER — LIDOCAINE 2% (20 MG/ML) 5 ML SYRINGE
INTRAMUSCULAR | Status: DC | PRN
  Administered 2018-07-02: 80 mg via INTRAVENOUS

## 2018-07-02 MED ORDER — MEPERIDINE HCL 50 MG/ML IJ SOLN: 6.2500 mg | INTRAMUSCULAR | Status: DC | PRN

## 2018-07-02 MED ORDER — BUPIVACAINE-EPINEPHRINE 0.25% -1:200000 IJ SOLN
INTRAMUSCULAR | Status: DC | PRN
  Administered 2018-07-02 (×2): 10 mL

## 2018-07-02 MED ORDER — CELECOXIB 200 MG PO CAPS
200.0000 mg | ORAL_CAPSULE | ORAL | Status: AC
  Administered 2018-07-02: 200 mg via ORAL
  Filled 2018-07-02: qty 1

## 2018-07-02 MED ORDER — DEXAMETHASONE SODIUM PHOSPHATE 10 MG/ML IJ SOLN
INTRAMUSCULAR | Status: DC | PRN
  Administered 2018-07-02: 8 mg via INTRAVENOUS

## 2018-07-02 MED ORDER — METHYLENE BLUE 0.5 % INJ SOLN
INTRAVENOUS | Status: AC
  Filled 2018-07-02: qty 10

## 2018-07-02 MED ORDER — BUPIVACAINE-EPINEPHRINE (PF) 0.25% -1:200000 IJ SOLN
INTRAMUSCULAR | Status: AC
  Filled 2018-07-02: qty 30

## 2018-07-02 MED ORDER — BUPIVACAINE-EPINEPHRINE (PF) 0.5% -1:200000 IJ SOLN
INTRAMUSCULAR | Status: DC | PRN
  Administered 2018-07-02: 30 mL via PERINEURAL

## 2018-07-02 MED ORDER — PROPOFOL 10 MG/ML IV BOLUS
INTRAVENOUS | Status: DC | PRN
  Administered 2018-07-02: 150 mg via INTRAVENOUS

## 2018-07-02 MED ORDER — FENTANYL CITRATE (PF) 250 MCG/5ML IJ SOLN
INTRAMUSCULAR | Status: DC | PRN
  Administered 2018-07-02 (×3): 25 ug via INTRAVENOUS

## 2018-07-02 MED ORDER — HEPARIN SOD (PORK) LOCK FLUSH 100 UNIT/ML IV SOLN
INTRAVENOUS | Status: AC
  Filled 2018-07-02: qty 5

## 2018-07-02 MED ORDER — LACTATED RINGERS IV SOLN
INTRAVENOUS | Status: DC
  Administered 2018-07-02: 07:00:00 via INTRAVENOUS

## 2018-07-02 MED ORDER — IOPAMIDOL (ISOVUE-300) INJECTION 61%
INTRAVENOUS | Status: AC
  Filled 2018-07-02: qty 50

## 2018-07-02 MED ORDER — OXYCODONE HCL 5 MG/5ML PO SOLN: 5.0000 mg | Freq: Once | ORAL | Status: DC | PRN

## 2018-07-02 MED ORDER — METOCLOPRAMIDE HCL 5 MG/ML IJ SOLN: 10.0000 mg | Freq: Once | INTRAMUSCULAR | Status: DC | PRN

## 2018-07-02 MED ORDER — GABAPENTIN 300 MG PO CAPS
300.0000 mg | ORAL_CAPSULE | ORAL | Status: AC
  Administered 2018-07-02: 300 mg via ORAL
  Filled 2018-07-02: qty 1

## 2018-07-02 MED ORDER — SODIUM CHLORIDE 0.9 % IV SOLN
INTRAVENOUS | Status: AC
  Filled 2018-07-02: qty 1.2

## 2018-07-02 SURGICAL SUPPLY — 77 items
ADH SKN CLS APL DERMABOND .7 (GAUZE/BANDAGES/DRESSINGS) ×4
APPLIER CLIP 9.375 MED OPEN (MISCELLANEOUS)
APR CLP MED 9.3 20 MLT OPN (MISCELLANEOUS)
BAG DECANTER FOR FLEXI CONT (MISCELLANEOUS) ×4 IMPLANT
BINDER BREAST LRG (GAUZE/BANDAGES/DRESSINGS) IMPLANT
BINDER BREAST XLRG (GAUZE/BANDAGES/DRESSINGS) IMPLANT
BLADE SURG 15 STRL LF DISP TIS (BLADE) ×2 IMPLANT
BLADE SURG 15 STRL SS (BLADE) ×4
CANISTER SUCT 3000ML PPV (MISCELLANEOUS) ×4 IMPLANT
CHLORAPREP W/TINT 10.5 ML (MISCELLANEOUS) ×4 IMPLANT
CHLORAPREP W/TINT 26ML (MISCELLANEOUS) ×4 IMPLANT
CLIP APPLIE 9.375 MED OPEN (MISCELLANEOUS) IMPLANT
CLIP VESOCCLUDE MED 6/CT (CLIP) ×4 IMPLANT
CONT SPEC 4OZ CLIKSEAL STRL BL (MISCELLANEOUS) ×4 IMPLANT
COVER PROBE W GEL 5X96 (DRAPES) ×4 IMPLANT
COVER SURGICAL LIGHT HANDLE (MISCELLANEOUS) ×4 IMPLANT
COVER TRANSDUCER ULTRASND GEL (DRAPE) ×4 IMPLANT
COVER WAND RF STERILE (DRAPES) ×4 IMPLANT
CRADLE DONUT ADULT HEAD (MISCELLANEOUS) ×4 IMPLANT
DECANTER SPIKE VIAL GLASS SM (MISCELLANEOUS) IMPLANT
DERMABOND ADVANCED (GAUZE/BANDAGES/DRESSINGS) ×4
DERMABOND ADVANCED .7 DNX12 (GAUZE/BANDAGES/DRESSINGS) ×3 IMPLANT
DEVICE DUBIN SPECIMEN MAMMOGRA (MISCELLANEOUS) ×7 IMPLANT
DRAPE C-ARM 42X72 X-RAY (DRAPES) ×4 IMPLANT
DRAPE CHEST BREAST 15X10 FENES (DRAPES) ×4 IMPLANT
DRAPE UTILITY XL STRL (DRAPES) ×4 IMPLANT
DRSG PAD ABDOMINAL 8X10 ST (GAUZE/BANDAGES/DRESSINGS) IMPLANT
ELECT CAUTERY BLADE 6.4 (BLADE) ×4 IMPLANT
ELECT COATED BLADE 2.86 ST (ELECTRODE) ×8 IMPLANT
ELECT REM PT RETURN 9FT ADLT (ELECTROSURGICAL) ×4
ELECTRODE REM PT RTRN 9FT ADLT (ELECTROSURGICAL) ×2 IMPLANT
GAUZE 4X4 16PLY RFD (DISPOSABLE) ×4 IMPLANT
GEL ULTRASOUND 20GR AQUASONIC (MISCELLANEOUS) ×3 IMPLANT
GLOVE BIOGEL PI IND STRL 8 (GLOVE) ×2 IMPLANT
GLOVE BIOGEL PI INDICATOR 8 (GLOVE) ×4
GLOVE ECLIPSE 7.5 STRL STRAW (GLOVE) ×8 IMPLANT
GOWN STRL REUS W/ TWL LRG LVL3 (GOWN DISPOSABLE) ×5 IMPLANT
GOWN STRL REUS W/ TWL XL LVL3 (GOWN DISPOSABLE) ×4 IMPLANT
GOWN STRL REUS W/TWL LRG LVL3 (GOWN DISPOSABLE) ×16
GOWN STRL REUS W/TWL XL LVL3 (GOWN DISPOSABLE) ×8
ILLUMINATOR WAVEGUIDE N/F (MISCELLANEOUS) IMPLANT
INTRODUCER COOK 11FR (CATHETERS) IMPLANT
IV CATH 14GX2 1/4 (CATHETERS) IMPLANT
KIT BASIN OR (CUSTOM PROCEDURE TRAY) ×4 IMPLANT
KIT MARKER MARGIN INK (KITS) ×4 IMPLANT
KIT PORT POWER 8FR ISP CVUE (Port) IMPLANT
KIT TURNOVER KIT B (KITS) ×4 IMPLANT
NDL FILTER BLUNT 18X1 1/2 (NEEDLE) IMPLANT
NDL HYPO 25GX1X1/2 BEV (NEEDLE) ×1 IMPLANT
NDL SAFETY ECLIPSE 18X1.5 (NEEDLE) IMPLANT
NEEDLE 22X1 1/2 (OR ONLY) (NEEDLE) ×7 IMPLANT
NEEDLE FILTER BLUNT 18X 1/2SAF (NEEDLE)
NEEDLE FILTER BLUNT 18X1 1/2 (NEEDLE) IMPLANT
NEEDLE HYPO 18GX1.5 SHARP (NEEDLE)
NEEDLE HYPO 25GX1X1/2 BEV (NEEDLE) ×4 IMPLANT
NS IRRIG 1000ML POUR BTL (IV SOLUTION) ×4 IMPLANT
PACK SURGICAL SETUP 50X90 (CUSTOM PROCEDURE TRAY) ×4 IMPLANT
PAD ARMBOARD 7.5X6 YLW CONV (MISCELLANEOUS) ×4 IMPLANT
PENCIL BUTTON HOLSTER BLD 10FT (ELECTRODE) ×4 IMPLANT
SET INTRODUCER 12FR PACEMAKER (INTRODUCER) IMPLANT
SET SHEATH INTRODUCER 10FR (MISCELLANEOUS) IMPLANT
SHEATH COOK PEEL AWAY SET 9F (SHEATH) IMPLANT
SPONGE LAP 18X18 X RAY DECT (DISPOSABLE) ×4 IMPLANT
SUT MON AB 5-0 PS2 18 (SUTURE) ×10 IMPLANT
SUT PROLENE 2 0 SH DA (SUTURE) ×4 IMPLANT
SUT SILK 2 0 (SUTURE)
SUT SILK 2-0 18XBRD TIE 12 (SUTURE) IMPLANT
SUT VIC AB 3-0 SH 18 (SUTURE) ×12 IMPLANT
SYR 5ML LUER SLIP (SYRINGE) ×4 IMPLANT
SYR BULB 3OZ (MISCELLANEOUS) ×4 IMPLANT
SYR CONTROL 10ML LL (SYRINGE) ×4 IMPLANT
TOWEL OR 17X24 6PK STRL BLUE (TOWEL DISPOSABLE) ×4 IMPLANT
TOWEL OR 17X26 10 PK STRL BLUE (TOWEL DISPOSABLE) ×4 IMPLANT
TRAY LAPAROSCOPIC MC (CUSTOM PROCEDURE TRAY) ×4 IMPLANT
TUBE CONNECTING 12'X1/4 (SUCTIONS) ×1
TUBE CONNECTING 12X1/4 (SUCTIONS) ×3 IMPLANT
YANKAUER SUCT BULB TIP NO VENT (SUCTIONS) ×4 IMPLANT

## 2018-07-02 NOTE — Transfer of Care (Signed)
Immediate Anesthesia Transfer of Care Note  Patient: Rachel Kelly  Procedure(s) Performed: RIGHT BREAST RADIOACTIVE SEED LUMPECTOMY X2 AND RIGHT AXILLARY  SENTINEL LYMPH NODE BIOPSY (Right Breast) INSERTION PORT-A-CATH TRIED NO PORT IN PLACE (N/A Chest)  Patient Location: PACU  Anesthesia Type:General  Level of Consciousness: awake  Airway & Oxygen Therapy: Patient Spontanous Breathing and Patient connected to face mask oxygen  Post-op Assessment: Report given to RN and Post -op Vital signs reviewed and stable  Post vital signs: Reviewed and stable  Last Vitals:  Vitals Value Taken Time  BP    Temp    Pulse 74 07/02/2018 11:26 AM  Resp 14 07/02/2018 11:26 AM  SpO2 100 % 07/02/2018 11:26 AM  Vitals shown include unvalidated device data.  Last Pain:  Vitals:   07/02/18 0711  TempSrc:   PainSc: 0-No pain         Complications: No apparent anesthesia complications

## 2018-07-02 NOTE — Addendum Note (Signed)
Addendum  created 07/02/18 1302 by Marsa Aris, CRNA   Charge Capture section accepted

## 2018-07-02 NOTE — Discharge Instructions (Signed)
[facility] °Office Phone Number [phone] ° °BREAST BIOPSY/ PARTIAL MASTECTOMY: POST OP INSTRUCTIONS ° °Always review your discharge instruction sheet given to you by the facility where your surgery was performed. ° °IF YOU HAVE DISABILITY OR FAMILY LEAVE FORMS, YOU MUST BRING THEM TO THE OFFICE FOR PROCESSING.  DO NOT GIVE THEM TO YOUR DOCTOR. ° °1. A prescription for pain medication may be given to you upon discharge.  Take your pain medication as prescribed, if needed.  If narcotic pain medicine is not needed, then you may take acetaminophen (Tylenol) or ibuprofen (Advil) as needed. °2. Take your usually prescribed medications unless otherwise directed °3. If you need a refill on your pain medication, please contact your pharmacy.  They will contact our office to request authorization.  Prescriptions will not be filled after 5pm or on week-ends. °4. You should eat very light the first 24 hours after surgery, such as soup, crackers, pudding, etc.  Resume your normal diet the day after surgery. °5. Most patients will experience some swelling and bruising in the breast.  Ice packs and a good support bra will help.  Swelling and bruising can take several days to resolve.  °6. It is common to experience some constipation if taking pain medication after surgery.  Increasing fluid intake and taking a stool softener will usually help or prevent this problem from occurring.  A mild laxative (Milk of Magnesia or Miralax) should be taken according to package directions if there are no bowel movements after 48 hours. °7. Unless discharge instructions indicate otherwise, you may remove your bandages 24-48 hours after surgery, and you may shower at that time.  You may have steri-strips (small skin tapes) in place directly over the incision.  These strips should be left on the skin for 7-10 days.  If your surgeon used skin glue on the incision, you may shower in 24 hours.  The glue will flake off over the  next 2-3 weeks.  Any sutures or staples will be removed at the office during your follow-up visit. °8. ACTIVITIES:  You may resume regular daily activities (gradually increasing) beginning the next day.  Wearing a good support bra or sports bra minimizes pain and swelling.  You may have sexual intercourse when it is comfortable. °a. You may drive when you no longer are taking prescription pain medication, you can comfortably wear a seatbelt, and you can safely maneuver your car and apply brakes. °b. RETURN TO WORK:  ______________________________________________________________________________________ °9. You should see your doctor in the office for a follow-up appointment approximately two weeks after your surgery.  Your doctor’s nurse will typically make your follow-up appointment when she calls you with your pathology report.  Expect your pathology report 2-3 business days after your surgery.  You may call to check if you do not hear from us after three days. °10. OTHER INSTRUCTIONS: _______________________________________________________________________________________________ _____________________________________________________________________________________________________________________________________ °_____________________________________________________________________________________________________________________________________ °_____________________________________________________________________________________________________________________________________ ° °WHEN TO CALL YOUR DOCTOR: °1. Fever over 101.0 °2. Nausea and/or vomiting. °3. Extreme swelling or bruising. °4. Continued bleeding from incision. °5. Increased pain, redness, or drainage from the incision. ° °The clinic staff is available to answer your questions during regular business hours.  Please don’t hesitate to call and ask to speak to one of the nurses for clinical concerns.  If you have a medical emergency, go to the nearest  emergency room or call 911.  A surgeon from [facility] is always on call at the hospital. ° °For further questions, please visit [email or web address]  °

## 2018-07-02 NOTE — Op Note (Signed)
Preoperative Diagnosis: right breast cancer  Postoprative Diagnosis: right breast cancer  Procedure: Procedure(s): RIGHT BREAST RADIOACTIVE SEED LUMPECTOMY X2 AND RIGHT AXILLARY  SENTINEL LYMPH NODE BIOPSY Attempted placement of Port-A-Cath-aborted   Surgeon: [name] T   Assistants: None  Anesthesia:  General LMA anesthesia  Indications: 72 year old female with a new diagnosis of cancer of the right breast, upper outer quadrant. Clinical stage Ib, triple negative. Additional apparent sclerosing benign papilloma about 4 cm distant from this.  After preoperative discussion regarding indications and nature of the procedure and risks detailed elsewhere we have elected to proceed with radioactive seed localized lumpectomy x2 of her cancer and the benign papilloma with axillary sentinel lymph node biopsy and placement of Port-A-Cath.    Procedure Detail: In the holding area the patient underwent a pectoral block and underwent injection of 1 mCi of technetium sulfur colloid intradermally around the right nipple.  She had previously gone accurate placement of radioactive seeds at the site of her cancer at a ribbon-shaped clip and at the papilloma at a dumbbell shaped clip.  She was taken to the operating room, placed in supine position on the operating table, and general LMA anesthesia induced.  The entire anterior chest, breast, right axilla and upper arm and neck were sterilely prepped and draped.  Patient timeout was performed and correct procedure verified.  The lumpectomy was approached initially.  The neoprobe was used to localize both seeds at the 10:00 and 9 o'clock position upper outer right breast.  I used a single curvilinear incision in a skin crease as these were fairly close together.  Dissection was carried down through the subcutaneous tissue toward the breast capsule.  I initially approached the superior malignant lesion.  Using the neoprobe for guidance I excised a generous,  approximately 4 to 5 cm globular specimen around the seed.  The specimen was removed and inked for margins.  Specimen x-ray showed the seed and marking clip relatively centrally located in the specimen but somewhat closer to the lateral margin.  I excised an additional 5 mm lateral margin that was sent as a separate specimen oriented.  Using the neoprobe and this incision the second seed at the site of the papilloma was localized immediately inferior to the lumpectomy cavity.  I therefore processed this through the lumpectomy cavity using the neoprobe for guidance and excised an approximately 2 cm specimen around the seed.  This was essentially some additional inferior margin from the malignancy.  The specimen was inked for margins and specimen x-ray showed that it contained the seed and marking clip.  The skin was infiltrated with Marcaine.  Complete hemostasis was assured.  The lumpectomy cavity was marked with clips.  The breast and subcutaneous tissue was closed with interrupted 3-0 Vicryl and skin with subcuticular 5-0 Monocryl. Attention was turned to the sentinel node.  A hot area in the axilla was found with the neoprobe and a transverse incision made in a skin crease.  Dissection was carried down through the subcutaneous tissue.  Clavipectoral fascia was incised.  The neoprobe indicated an area of increased counts fairly deep in the axilla near the edge of the pectoralis.  Dissection was carried down toward this area using the neoprobe for guidance and I elevated a very small slightly firm lymph node which was excised and ex vivo had counts of about 150.  Carefully exploring the axilla multiple times with the neoprobe following this I could not find any counts out of single digits  and there was no palpable adenopathy.  This was sent as right axillary sentinel lymph node.  This incision was closed in layers with interrupted 3-0 Vicryl and skin with running subcuticular 5-0 Monocryl in both incisions also  closed with Dermabond.  Gloves and instruments were changed.  Attention was turned to the Port-A-Cath.  Ultrasound was used to localize the right internal jugular vein.  Skin was anesthetized and a small incision made.  I was then able to cannulate the internal jugular vein under direct ultrasound vision with good blood return.  However I could not get the guidewire to thread.  To further times I visualize the IJ vein and directly visualized entry with the needle and with good blood return but was unable to thread the wire.  His body habitus and somewhat short neck may have contributed to the difficulty.  I then attempted once cannulation of the left subclavian vein but did not obtain blood return and again due to the patient's body habitus I did not feel that it was safe to continue and elected to abort the Port-A-Cath for today and consider placement by interventional radiology.  Sponge needle and instrument counts were correct.    Findings: As above  Estimated Blood Loss:  less than 50 mL         Drains: None  Blood Given: none          Specimens: #1 right breast lumpectomy malignant on previous biopsy   #2 right breast lumpectomy papilloma on previous biopsy #3 further lateral margin from lumpectomy #1   before right axillary sentinel lymph node        Complications: Unable to place Port-A-Cath as above         Disposition: PACU - hemodynamically stable.         Condition: stable

## 2018-07-02 NOTE — Anesthesia Postprocedure Evaluation (Signed)
Anesthesia Post Note  Patient: Rachel Kelly  Procedure(s) Performed: RIGHT BREAST RADIOACTIVE SEED LUMPECTOMY X2 AND RIGHT AXILLARY  SENTINEL LYMPH NODE BIOPSY (Right Breast) INSERTION PORT-A-CATH TRIED NO PORT IN PLACE (N/A Chest)     Patient location during evaluation: PACU Anesthesia Type: General Level of consciousness: awake and alert and oriented Pain management: pain level controlled Vital Signs Assessment: post-procedure vital signs reviewed and stable Respiratory status: spontaneous breathing, nonlabored ventilation and respiratory function stable Cardiovascular status: blood pressure returned to baseline and stable Postop Assessment: no apparent nausea or vomiting Anesthetic complications: no    Last Vitals:  Vitals:   07/02/18 1155 07/02/18 1210  BP: 119/75 129/75  Pulse: (!) 58 (!) 55  Resp: 13 13  Temp:    SpO2: 96% 92%    Last Pain:  Vitals:   07/02/18 1125  TempSrc:   PainSc: Asleep                 Brynlea Spindler A.

## 2018-07-02 NOTE — Anesthesia Procedure Notes (Signed)
Procedure Name: LMA Insertion Date/Time: 07/02/2018 9:12 AM Performed by: Marsa Aris, CRNA Pre-anesthesia Checklist: Patient identified, Emergency Drugs available, Suction available and Patient being monitored Patient Re-evaluated:Patient Re-evaluated prior to induction Oxygen Delivery Method: Circle System Utilized Preoxygenation: Pre-oxygenation with 100% oxygen Induction Type: IV induction Ventilation: Mask ventilation without difficulty LMA: LMA inserted LMA Size: 4.0 Number of attempts: 1 Airway Equipment and Method: Bite block Placement Confirmation: positive ETCO2 Tube secured with: Tape Dental Injury: Teeth and Oropharynx as per pre-operative assessment  Comments: No change in dentition from pre-procedure

## 2018-07-02 NOTE — Anesthesia Procedure Notes (Signed)
Anesthesia Regional Block: Pectoralis block   Pre-Anesthetic Checklist: ,, timeout performed, Correct Patient, Correct Site, Correct Laterality, Correct Procedure, Correct Position, site marked, Risks and benefits discussed,  Surgical consent,  Pre-op evaluation,  At surgeon's request and post-op pain management  Laterality: Right  Prep: chloraprep       Needles:  Injection technique: Single-shot  Needle Type: Echogenic Stimulator Needle     Needle Length: 9cm  Needle Gauge: 21   Needle insertion depth: 7 cm   Additional Needles:   Procedures:,,,, ultrasound used (permanent image in chart),,,,  Narrative:  Start time: 07/02/2018 8:18 AM End time: 07/02/2018 8:23 AM Injection made incrementally with aspirations every 5 mL.  Performed by: Personally  Anesthesiologist: Josephine Igo, MD  Additional Notes: Timeout performed. Patient sedated. Relevant anatomy ID'd using [place]. Incremental 2-33ml injection of LA with frequent aspiration. Patient tolerated procedure well.        Right Pectoralis Block

## 2018-07-02 NOTE — Interval H&P Note (Signed)
History and Physical Interval Note:  07/02/2018 8:47 AM  Rachel Kelly  has presented today for surgery, with the diagnosis of right breast cancer  The various methods of treatment have been discussed with the patient and family. After consideration of risks, benefits and other options for treatment, the patient has consented to  Procedure(s): RIGHT BREAST RADIOACTIVE SEED LUMPECTOMY X2 AND RIGHT AXILLARY  SENTINEL LYMPH NODE BIOPSY (Right) INSERTION PORT-A-CATH (N/A) as a surgical intervention .  The patient's history has been reviewed, patient examined, no change in status, stable for surgery.  I have reviewed the patient's chart and labs.  Questions were answered to the patient's satisfaction.     Darene Lamer Kaelan Amble

## 2018-07-05 ENCOUNTER — Encounter (HOSPITAL_COMMUNITY): Payer: Self-pay | Admitting: General Surgery

## 2018-07-06 ENCOUNTER — Other Ambulatory Visit (HOSPITAL_COMMUNITY): Payer: Self-pay | Admitting: General Surgery

## 2018-07-06 ENCOUNTER — Telehealth (HOSPITAL_COMMUNITY): Payer: Self-pay

## 2018-07-06 DIAGNOSIS — C50411 Malignant neoplasm of upper-outer quadrant of right female breast: Secondary | ICD-10-CM

## 2018-07-06 DIAGNOSIS — Z171 Estrogen receptor negative status [ER-]: Principal | ICD-10-CM

## 2018-07-06 NOTE — Telephone Encounter (Signed)
Called to schedule port, no answer, left vm. AW

## 2018-07-09 ENCOUNTER — Ambulatory Visit: Payer: Medicare Other | Admitting: Oncology

## 2018-07-12 ENCOUNTER — Other Ambulatory Visit: Payer: Self-pay | Admitting: Oncology

## 2018-07-12 ENCOUNTER — Encounter: Payer: Self-pay | Admitting: Pharmacist

## 2018-07-12 ENCOUNTER — Inpatient Hospital Stay: Payer: Medicare Other | Attending: Oncology

## 2018-07-12 DIAGNOSIS — Z90710 Acquired absence of both cervix and uterus: Secondary | ICD-10-CM | POA: Insufficient documentation

## 2018-07-12 DIAGNOSIS — I10 Essential (primary) hypertension: Secondary | ICD-10-CM | POA: Insufficient documentation

## 2018-07-12 DIAGNOSIS — Z171 Estrogen receptor negative status [ER-]: Secondary | ICD-10-CM | POA: Insufficient documentation

## 2018-07-12 DIAGNOSIS — C50411 Malignant neoplasm of upper-outer quadrant of right female breast: Secondary | ICD-10-CM | POA: Insufficient documentation

## 2018-07-12 DIAGNOSIS — Z79899 Other long term (current) drug therapy: Secondary | ICD-10-CM | POA: Insufficient documentation

## 2018-07-12 NOTE — Progress Notes (Signed)
START ON PATHWAY REGIMEN - Breast     Cycles 1 through 4 = every 14 days:     Doxorubicin      Cyclophosphamide      Pegfilgrastim-xxxx    Cycles 5 through 8 = every 14 days:     Paclitaxel      Pegfilgrastim-xxxx   **Always confirm dose/schedule in your pharmacy ordering system**  Patient Characteristics: Postoperative without Neoadjuvant Therapy (Pathologic Staging), Invasive Disease, Adjuvant Therapy, HER2 Negative/Unknown/Equivocal, ER Negative/Unknown, Node Negative, pT1a-c, pN0/N50m or pT2 or Higher, pN0 Therapeutic Status: Postoperative without Neoadjuvant Therapy (Pathologic Staging) AJCC Grade: G3 AJCC N Category: pN0 AJCC M Category: cM0 ER Status: Negative (-) AJCC 8 Stage Grouping: IB HER2 Status: Negative (-) Oncotype Dx Recurrence Score: Not Appropriate AJCC T Category: pT1b PR Status: Negative (-) Intent of Therapy: Curative Intent, Discussed with Patient

## 2018-07-12 NOTE — Progress Notes (Signed)
[facility]  [phone] Fax:([phone]     ID: Rachel Kelly DOB: 04-Oct-1945  MR#: [id number]  TDH#:[id number]  Patient Care Team: [name], MD as PCP - General (Family Medicine) Deneise Lever, MD as Referring Physician (Pulmonary Disease) [name], MD as Consulting Physician (General Surgery) , [name], MD as Consulting Physician (Oncology) Eppie Gibson, MD as Attending Physician (Radiation Oncology) Chelsea Aus, DDS as Consulting Physician (Dentistry) OTHER MD:  CHIEF COMPLAINT: Triple negative breast cancer  CURRENT TREATMENT: Adjuvant chemotherapy and anti-HER-2 immunotherapy  INTERVAL HISTORY: Rachel Kelly returns today for follow-up of her triple negative breast cancer. She is accompanied by her daughter.  Since her last visit here on 06/09/2018, she underwent a right breast lumpectomy on 07/02/2018. Her surgery went very well and she didn't need to fill her pain prescription.  There was no bleeding or fever.  She is delighted with the cosmetic results.  The pathology from her lumpectomy showed ([id number]): invasive ductal carcinoma, 0.8 cm, nottingham grade II of III. Prognostic indicators significant for: estrogen receptor, 0% negative and progesterone receptor, 0% negative. Proliferation marker Ki67 at 1%. HER2 positive by immunohistochemisty (3+). Margins of resection are not involved. Closest margin: 0.7 cm, posterior.   On the same day, biopsy of the right breast at seed #2 shows: biopsy site changes. There is a fibroadenoma, hyalinized. There is sclerosing adenosis. There is usual duct epithelial hyperplasia. There are fibrocystic changes. Microcalcifications are associated with these processes. In addition, biopsy of an additional lateral margin shows: breast parenchyma, negative for carcinoma. Finally, biopsy of the right sentinel axillary #1 lymph node and the SLN right sentinel node show: negative for  carcinoma.   REVIEW OF SYSTEMS: Rachel Kelly is doing well overall. Dr. Elvera Lennox prescribed her a cream for contact dermatitis.  She was delighted to learn that she will need less chemotherapy than originally thought. The patient denies unusual headaches, visual changes, nausea, vomiting, or dizziness. There has been no unusual cough, phlegm production, or pleurisy. This been no change in bowel or bladder habits. The patient denies unexplained fatigue or unexplained weight loss, bleeding, rash, or fever. A detailed review of systems was otherwise noncontributory.    HISTORY OF CURRENT ILLNESS: From the original intake note:  Rachel Kelly had routine screening mammography on 05/26/2018 showing a possible abnormality in the right breast. She underwent bilateral diagnostic mammography with tomography and right breast ultrasonography at [facility] on 06/02/2018 showing: breast density category C, suspicious mass at the 10 o'clock position 8 cm from the nipple. It measures 10 x 7 x 6 mm. Just adjacent to this mass, there is an irregular hypoechoic mass with punctate echogenic foci. It measures 9 x 8 x 3 mm. Additional indeterminate mass at the 9 o'clock position 6 cm from the nipple. It measures 6 x 4 x 4 mm. No suspicious right axillary lymphadenopathy.  Accordingly on 06/02/2018 she proceeded to biopsy of two of the right breast areas in question. The pathology from this procedure showed [phone]): at the 10 o'clock position, invasive ductal carcinoma, grade 2, estrogen and progesterone receptor negative, HER-2 equivocal by immunohistochemistry at 2+, but negative by [facility] with a signals ratio of 1.47 and number per cell 2.20.  Biopsy of the right breast lesion at the 9 o'clock position showed a sclerotic lesion with calcifications and negative for carcinoma.  The patient's subsequent history is as detailed below.   PAST MEDICAL HISTORY: Past Medical History:  Diagnosis Date  . Anemia   .  Blood transfusion 2005  . Hernia   . HH (hiatus hernia)   . Hyperlipidemia   . Hypertension   . PAH (pulmonary artery hypertension) ([place])   . Sarcoidosis   . Shortness of breath    on exertion  . Sleep apnea    does not use cpap  . Wheezing     PAST SURGICAL HISTORY: Past Surgical History:  Procedure Laterality Date  . ABDOMINAL HYSTERECTOMY  1988  . BACK SURGERY  2005   lower  . BREAST LUMPECTOMY WITH RADIOACTIVE SEED AND SENTINEL LYMPH NODE BIOPSY Right 07/02/2018   Procedure: RIGHT BREAST RADIOACTIVE SEED LUMPECTOMY X2 AND RIGHT AXILLARY  SENTINEL LYMPH NODE BIOPSY;  Surgeon: [name], MD;  Location: [facility];  Service: General;  Laterality: Right;  . ESOPHAGOGASTRODUODENOSCOPY  06/24/2012   Procedure: ESOPHAGOGASTRODUODENOSCOPY (EGD);  Surgeon: [name], MD;  Location: [name];  Service: General;  Laterality: N/A;  . HIATAL HERNIA REPAIR  08/18/2012   Procedure: LAPAROSCOPIC REPAIR OF HIATAL HERNIA;  Surgeon: [name], MD;  Location: [facility];  Service: General;  Laterality: N/A;  Laparoscopic Hiatal Hernia Repair   . INSERTION OF MESH  08/18/2012   Procedure: INSERTION OF MESH;  Surgeon: [name], MD;  Location: [facility];  Service: General;;  . PORTACATH PLACEMENT N/A 07/02/2018   Procedure: INSERTION PORT-A-CATH TRIED NO PORT IN PLACE;  Surgeon: [name], MD;  Location: [facility];  Service: General;  Laterality: N/A;  . VESICOVAGINAL FISTULA CLOSURE W/ TAH      FAMILY HISTORY Family History  Problem Relation Age of Onset  . Clotting disorder Mother        PE  . Heart disease Father   . Cancer Maternal Grandmother        not sure type, but spread to the brain.  . Coronary artery disease Other   . Cancer Other        ? type   She notes that her father died from heart disease at age 37. Patients' mother lives in a nursing home and is 84 (as of November 2019)-- she has a form of dementia. The patient has 3 brothers and 0 sisters.  A maternal aunt had a form of cancer, not clear what it was.  The maternal grandmother had breast cancer, but was never treated because of her old age.  The patient does not think anyone in her family had ovarian cancer, but there are 2 others in her family that had colon cancer.    GYNECOLOGIC HISTORY:  No LMP recorded. Patient has had a hysterectomy. 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 She lives alone. She does not have any pets.  Her daughter Deatra Canter "Cantrece" Grandville Silos, 48, lives in [place] and works in Psychologist, educational. No grandchildren.  The patient belongs to [place]: In place, but she can't remember who her power of attorney is (believes it is her daughter).   HEALTH MAINTENANCE: Social History   Tobacco Use  . Smoking status: Never Smoker  . Smokeless tobacco: Never Used  Substance Use Topics  . Alcohol use: No  . Drug use: No     Colonoscopy: Dr. Collene Mares  PAP: yes, but not sure when  Bone density: No  Allergies  Allergen Reactions  . Penicillins Rash    Fine red bumps that spread all over the body. Has patient had a PCN reaction causing immediate rash, facial/tongue/throat swelling, SOB or lightheadedness with hypotension: No Has patient had a PCN reaction causing severe rash involving mucus membranes or skin necrosis: No Has patient had a PCN reaction that required hospitalization: No Has patient had a PCN reaction occurring within the last 10 years: No If all of the above answers are "NO", then may proceed with Cephalosporin use.   . Skin Adhesives [Cyanoacrylate] Itching and Rash    Dermabond    Current Outpatient Medications  Medication Sig Dispense Refill  . Ascorbic Acid (VITAMIN C) 1000 MG tablet Take 1,000 mg  by mouth every other day.     . Cholecalciferol (VITAMIN D) 2000 UNITS CAPS Take 2,000 Units by mouth daily.     . Coenzyme Q10 200 MG TABS Take 200 mg by mouth daily.    Marland Kitchen ibuprofen (ADVIL,MOTRIN) 200 MG tablet Take 400 mg by mouth every 6 (six) hours as needed for headache or moderate pain.    Marland Kitchen ketotifen (ALAWAY) 0.025 % ophthalmic solution Place 1 drop into both eyes 2 (two) times daily.    Marland Kitchen loperamide (IMODIUM A-D) 2 MG capsule Take 2 mg by mouth daily as needed for diarrhea or loose stools.    Marland Kitchen omeprazole (PRILOSEC) 20 MG capsule Take 40 mg by mouth daily.     . Polyvinyl Alcohol-Povidone PF (REFRESH) 1.4-0.6 % SOLN Place 1 drop into both eyes daily as needed (dry eyes).     . potassium gluconate 595 MG TABS Take 595 mg by mouth daily.     . simvastatin (ZOCOR) 20 MG tablet Take 20 mg by mouth every morning.     . traMADol (ULTRAM) 50 MG tablet Take 1 tablet (50 mg total) by mouth every 6 (six) hours as needed. 15 tablet 1  . triamterene-hydrochlorothiazide (MAXZIDE-25) 37.5-25 MG per tablet Take 1 tablet by mouth every morning.      No current facility-administered medications for this visit.     OBJECTIVE: Middle-aged African-American woman in no acute distress  Vitals:   07/13/18 1122  BP: 120/76  Pulse: 74  Resp: 18  Temp: 97.8 F (36.6 C)  SpO2: 100%     Body mass index is 36.26 kg/m.   Wt Readings from Last 3 Encounters:  07/13/18 170 lb 8 oz (77.3 kg)  07/02/18 169 lb (76.7 kg)  06/29/18 169 lb 11.2 oz (77 kg)      ECOG FS:1 - Symptomatic but completely ambulatory  Sclerae unicteric, EOMs intact Oropharynx clear and moist No cervical or supraclavicular adenopathy Lungs no rales or rhonchi Heart regular rate and rhythm Abd soft, nontender, positive bowel sounds MSK no focal spinal tenderness, no upper extremity lymphedema Neuro: nonfocal, well oriented, appropriate affect Breasts: The right breast is status post recent lumpectomy.  The incisions are healing  nicely, without dehiscence, erythema, or swelling.  The left breast is unremarkable.  Both axillae are benign.  LAB RESULTS:  CMP     Component Value Date/Time   NA 137 06/29/2018 1328   K 3.7 06/29/2018 1328   CL 103 06/29/2018 1328   CO2 24 06/29/2018 1328   GLUCOSE 130 (H) 06/29/2018 1328   BUN 16 06/29/2018 1328   CREATININE 1.27 (H) 06/29/2018 1328   CREATININE 1.27 (H) 06/09/2018 1239   CALCIUM 9.4 06/29/2018 1328   PROT 7.7 06/09/2018 1239  ALBUMIN 3.9 06/09/2018 1239   AST 23 06/09/2018 1239   ALT 15 06/09/2018 1239   ALKPHOS 78 06/09/2018 1239   BILITOT 0.6 06/09/2018 1239   GFRNONAA 42 (L) 06/29/2018 1328   GFRNONAA 41 (L) 06/09/2018 1239   GFRAA 48 (L) 06/29/2018 1328   GFRAA 47 (L) 06/09/2018 1239    No results found for: TOTALPROTELP, ALBUMINELP, A1GS, A2GS, BETS, BETA2SER, GAMS, MSPIKE, SPEI  No results found for: KPAFRELGTCHN, LAMBDASER, KAPLAMBRATIO  Lab Results  Component Value Date   WBC 4.3 06/29/2018   NEUTROABS 2.7 06/09/2018   HGB 12.8 06/29/2018   HCT 40.6 06/29/2018   MCV 92.1 06/29/2018   PLT 196 06/29/2018    @LASTCHEMISTRY @  No results found for: LABCA2  No components found for: [id number]  No results for input(s): INR in the last 168 hours.  No results found for: LABCA2  No results found for: YQI347  No results found for: QQV956  No results found for: LOV564  No results found for: CA2729  No components found for: HGQUANT  No results found for: CEA1 / No results found for: CEA1   No results found for: AFPTUMOR  No results found for: CHROMOGRNA  No results found for: PSA1  No visits with results within 3 Day(s) from this visit.  Latest known visit with results is:  Hospital Outpatient Visit on 06/29/2018  Component Date Value Ref Range Status  . Sodium 06/29/2018 137  135 - 145 mmol/L Final  . Potassium 06/29/2018 3.7  3.5 - 5.1 mmol/L Final  . Chloride 06/29/2018 103  98 - 111 mmol/L Final  . CO2 06/29/2018 24  22  - 32 mmol/L Final  . Glucose, Bld 06/29/2018 130* 70 - 99 mg/dL Final  . BUN 06/29/2018 16  8 - 23 mg/dL Final  . Creatinine, Ser 06/29/2018 1.27* 0.44 - 1.00 mg/dL Final  . Calcium 06/29/2018 9.4  8.9 - 10.3 mg/dL Final  . GFR calc non Af Amer 06/29/2018 42* >60 mL/min Final  . GFR calc Af Amer 06/29/2018 48* >60 mL/min Final  . Anion gap 06/29/2018 10  5 - 15 Final   Performed at [facility], [place] [address], [place], [place] [address]  . WBC 06/29/2018 4.3  4.0 - 10.5 K/uL Final  . RBC 06/29/2018 4.41  3.87 - 5.11 MIL/uL Final  . Hemoglobin 06/29/2018 12.8  12.0 - 15.0 g/dL Final  . HCT 06/29/2018 40.6  36.0 - 46.0 % Final  . MCV 06/29/2018 92.1  80.0 - 100.0 fL Final  . MCH 06/29/2018 29.0  26.0 - 34.0 pg Final  . MCHC 06/29/2018 31.5  30.0 - 36.0 g/dL Final  . RDW 06/29/2018 12.7  11.5 - 15.5 % Final  . Platelets 06/29/2018 196  150 - 400 K/uL Final  . nRBC 06/29/2018 0.0  0.0 - 0.2 % Final   Performed at [facility], [place] [address], [place], [place] [address]    (this displays the last labs from the last 3 days)  No results found for: TOTALPROTELP, ALBUMINELP, A1GS, A2GS, BETS, BETA2SER, GAMS, MSPIKE, SPEI (this displays SPEP labs)  No results found for: KPAFRELGTCHN, LAMBDASER, KAPLAMBRATIO (kappa/lambda light chains)  No results found for: HGBA, HGBA2QUANT, HGBFQUANT, HGBSQUAN (Hemoglobinopathy evaluation)   No results found for: LDH  No results found for: IRON, TIBC, IRONPCTSAT (Iron and TIBC)  No results found for: FERRITIN  Urinalysis    Component Value Date/Time   COLORURINE YELLOW 11/21/2012 [place]  11/21/2012 1402   LABSPEC 1.025 11/21/2012 1402   PHURINE 5.0 11/21/2012 1402   GLUCOSEU NEGATIVE 11/21/2012 1402   HGBUR TRACE (A) 11/21/2012 1402   BILIRUBINUR NEGATIVE 11/21/2012 1402   KETONESUR NEGATIVE 11/21/2012 1402   PROTEINUR NEGATIVE 11/21/2012 1402   UROBILINOGEN 1.0 11/21/2012 1402   NITRITE NEGATIVE 11/21/2012  1402   LEUKOCYTESUR NEGATIVE 11/21/2012 1402     STUDIES:  Nm Sentinel Node Inj-no Rpt (breast)  Result Date: 07/02/2018 Sulfur colloid was injected by the nuclear medicine technologist for melanoma sentinel node.   Mm Breast Surgical Specimen  Result Date: 07/02/2018 CLINICAL DATA:  Status post surgical excision of a second right breast lesion following radioactive seed localization. EXAM: SPECIMEN RADIOGRAPH OF THE RIGHT BREAST COMPARISON:  Previous exam(s). FINDINGS: Status post excision of the right breast. The radioactive seed and biopsy marker clip are present, completely intact, and were marked for pathology. IMPRESSION: Specimen radiograph of the right breast. Electronically Signed   By: Lajean Manes M.D.   On: 07/02/2018 10:06   Mm Breast Surgical Specimen  Result Date: 07/02/2018 CLINICAL DATA:  Status post surgical excision of a right breast lesion following radioactive seed localization. EXAM: SPECIMEN RADIOGRAPH OF THE RIGHT BREAST COMPARISON:  Previous exam(s). FINDINGS: Status post excision of the right breast. The radioactive seed and biopsy marker clip are present, completely intact, and were marked for pathology. IMPRESSION: Specimen radiograph of the right breast. Electronically Signed   By: Lajean Manes M.D.   On: 07/02/2018 10:00   Dg Chest Port 1 View  Result Date: 07/02/2018 CLINICAL DATA:  Attempted Port-A-Cath placement. EXAM: PORTABLE CHEST 1 VIEW COMPARISON:  Radiographs of Nov 21, 2012. FINDINGS: Stable cardiomediastinal silhouette. Stable right paratracheal calcified adenopathy is noted. No pneumothorax is noted. Mild bibasilar subsegmental atelectasis is noted. Bony thorax is unremarkable. No significant pleural effusion is noted. IMPRESSION: Mild bibasilar subsegmental atelectasis. Electronically Signed   By: Marijo Conception, M.D.   On: 07/02/2018 11:47   Mm Rt Radioactive Seed Loc Mammo Guide  Result Date: 07/01/2018 CLINICAL DATA:  Patient presents for  radioactive seed localization of an area of breast carcinoma, marked with a ribbon shaped biopsy clip, in the lateral right breast, as well as radioactive seed localization of an area of possible papilloma, marked with a coil shaped clip, in the more anterior, lateral right breast. EXAM: MAMMOGRAPHIC GUIDED RADIOACTIVE SEED LOCALIZATION OF THE RIGHT BREAST: 2 BIOPSY CLIPS IN THE RIGHT BREAST WERE LOCALIZED. COMPARISON:  Previous exam(s). FINDINGS: Patient presents for radioactive seed localization prior to surgical excision. I met with the patient and we discussed the procedure of seed localization including benefits and alternatives. We discussed the high likelihood of a successful procedure. We discussed the risks of the procedure including infection, bleeding, tissue injury and further surgery. We discussed the low dose of radioactivity involved in the procedure. Informed, written consent was given. The usual time-out protocol was performed immediately prior to the procedure. Ribbon shaped clip: Using mammographic guidance, sterile technique, 1% lidocaine and an I-125 radioactive seed, the ribbon shaped biopsy clip was localized using a lateral approach. Order number of I-125 seed:  [id number]. Total activity:  5.456 millicuries reference Date: 06/11/2018 Coil shaped clip: Using mammographic guidance, sterile technique, 1% lidocaine and an I-125 radioactive seed, the coil shaped biopsy clip was localized using a lateral approach. Order number of I-125 seed:  [id number]. Total activity:  4.287 millicuries reference Date: 06/11/2018 The follow-up mammogram images confirm the seed in the expected location and  were marked for Dr. [name]. Follow-up survey of the patient confirms presence of both radioactive seeds. The patient tolerated the procedure well and was released from the [facility]. She was given instructions regarding seed removal. IMPRESSION: Radioactive seed localization of 2 lesions in the right  breast. No apparent complications. Electronically Signed   By: Lajean Manes M.D.   On: 07/01/2018 15:04   Mm Rt Radio Seed Ea Add Lesion Loc Mammo  Result Date: 07/01/2018 CLINICAL DATA:  Patient presents for radioactive seed localization of an area of breast carcinoma, marked with a ribbon shaped biopsy clip, in the lateral right breast, as well as radioactive seed localization of an area of possible papilloma, marked with a coil shaped clip, in the more anterior, lateral right breast. EXAM: MAMMOGRAPHIC GUIDED RADIOACTIVE SEED LOCALIZATION OF THE RIGHT BREAST: 2 BIOPSY CLIPS IN THE RIGHT BREAST WERE LOCALIZED. COMPARISON:  Previous exam(s). FINDINGS: Patient presents for radioactive seed localization prior to surgical excision. I met with the patient and we discussed the procedure of seed localization including benefits and alternatives. We discussed the high likelihood of a successful procedure. We discussed the risks of the procedure including infection, bleeding, tissue injury and further surgery. We discussed the low dose of radioactivity involved in the procedure. Informed, written consent was given. The usual time-out protocol was performed immediately prior to the procedure. Ribbon shaped clip: Using mammographic guidance, sterile technique, 1% lidocaine and an I-125 radioactive seed, the ribbon shaped biopsy clip was localized using a lateral approach. Order number of I-125 seed:  [id number]. Total activity:  8.250 millicuries reference Date: 06/11/2018 Coil shaped clip: Using mammographic guidance, sterile technique, 1% lidocaine and an I-125 radioactive seed, the coil shaped biopsy clip was localized using a lateral approach. Order number of I-125 seed:  [id number]. Total activity:  9.379 millicuries reference Date: 06/11/2018 The follow-up mammogram images confirm the seed in the expected location and were marked for Dr. [name]. Follow-up survey of the patient confirms presence of both radioactive  seeds. The patient tolerated the procedure well and was released from the Breast Center. She was given instructions regarding seed removal. IMPRESSION: Radioactive seed localization of 2 lesions in the right breast. No apparent complications. Electronically Signed   By: Lajean Manes M.D.   On: 07/01/2018 15:04    ELIGIBLE FOR AVAILABLE RESEARCH PROTOCOL: PREVENT  ASSESSMENT: 72 y.o. Rachel Kelly woman status post right breast upper outer quadrant biopsy 06/02/2018 for a clinical T1b N0, stage IB invasive ductal carcinoma, grade 2, triple negative, with an MIB-1 of 10%  (1) status post right lumpectomy and sentinel lymph node sampling 07/02/2018 for a pT1b pN0,stage IA invasive ductal carcinoma, grade 2, now HER-2 positive, still estrogen and progesterone receptor negative, with an MIB-1 of 1%  (2) adjuvant chemotherapy will consist of weekly paclitaxel x12 with trastuzumab  (3) trastuzumab to continue to total 1 year  (a) echocardiogram pending  (3) adjuvant radiation to follow   PLAN: I am delighted that the repeat prognostic panel on the definitive pathology from Rachel Kelly's tumor shows the cancer to be clearly HER-2 positive.  This greatly simplifies her chemotherapy and increase her chance of cure.  We discussed the fact that she will not need doxorubicin or cyclophosphamide but only weekly paclitaxel x12.  We discussed the possible toxicity side effects and complications of this agent including issues relating to peripheral neuropathy.  We then discussed trastuzumab in detail.  She understands this can weaken the heart muscle.  Most of the time if  this occurs it is temporary.  I am setting her up for an echocardiogram as soon as we can get that scheduled and hopefully we will be able to start her chemotherapy on 07/26/2018.  She is interested in the prevent study and she met with the study nurse today to begin completing the paperwork  She will see [place] again on January 6 and a week later, to  make sure she tolerated the initial Taxol without event.  After that we will see her with every other Taxol treatment until she completes her chemotherapy  She knows to call for any other issues that may develop before the next visit.  I spent approximately 30 minutes face to face with Rachel Kelly with more than 50% of that time spent in counseling and coordination of care.    , [name], MD  07/13/18 12:21 PM Medical Oncology and Hematology [facility] [address] [place], [place] [address] Tel. [phone]    Fax. [phone]    I, Jacqualyn Posey am acting as a Education administrator for Chauncey Cruel, MD.   I, Lurline Del MD, have reviewed the above documentation for accuracy and completeness, and I agree with the above.

## 2018-07-13 ENCOUNTER — Telehealth: Payer: Self-pay | Admitting: Oncology

## 2018-07-13 ENCOUNTER — Inpatient Hospital Stay (HOSPITAL_BASED_OUTPATIENT_CLINIC_OR_DEPARTMENT_OTHER): Payer: Medicare Other | Admitting: Oncology

## 2018-07-13 ENCOUNTER — Encounter: Payer: Self-pay | Admitting: *Deleted

## 2018-07-13 VITALS — BP 120/76 | HR 74 | Temp 97.8°F | Resp 18 | Ht <= 58 in | Wt 170.5 lb

## 2018-07-13 DIAGNOSIS — Z90710 Acquired absence of both cervix and uterus: Secondary | ICD-10-CM | POA: Diagnosis not present

## 2018-07-13 DIAGNOSIS — Z79899 Other long term (current) drug therapy: Secondary | ICD-10-CM

## 2018-07-13 DIAGNOSIS — Z171 Estrogen receptor negative status [ER-]: Secondary | ICD-10-CM | POA: Diagnosis not present

## 2018-07-13 DIAGNOSIS — I10 Essential (primary) hypertension: Secondary | ICD-10-CM

## 2018-07-13 DIAGNOSIS — C50411 Malignant neoplasm of upper-outer quadrant of right female breast: Secondary | ICD-10-CM

## 2018-07-13 MED ORDER — LIDOCAINE-PRILOCAINE 2.5-2.5 % EX CREA: TOPICAL_CREAM | CUTANEOUS | 3 refills | Status: DC

## 2018-07-13 MED ORDER — PROCHLORPERAZINE MALEATE 10 MG PO TABS: 10.0000 mg | ORAL_TABLET | Freq: Four times a day (QID) | ORAL | 1 refills | Status: DC | PRN

## 2018-07-13 NOTE — Progress Notes (Signed)
DISCONTINUE ON PATHWAY REGIMEN - Breast     Cycles 1 through 4 = every 14 days:     Doxorubicin      Cyclophosphamide      Pegfilgrastim-xxxx    Cycles 5 through 8 = every 14 days:     Paclitaxel      Pegfilgrastim-xxxx   **Always confirm dose/schedule in your pharmacy ordering system**  REASON: Other Reason PRIOR TREATMENT: [id number]: Dose-Dense AC-T (Paclitaxel q14d) - [Doxorubicin + Cyclophosphamide q14 Days x 4 Cycles, Followed by Paclitaxel 175 mg/m2 q14 Days x 4 Cycles] TREATMENT RESPONSE: Unable to Evaluate  START ON PATHWAY REGIMEN - Breast   Paclitaxel Weekly + Trastuzumab Weekly:   Administer weekly:     Paclitaxel      Trastuzumab-xxxx      Trastuzumab-xxxx   **Always confirm dose/schedule in your pharmacy ordering system**  Trastuzumab (Maintenance - NO Loading Dose):   A cycle is every 21 days:     Trastuzumab-xxxx   **Always confirm dose/schedule in your pharmacy ordering system**  Patient Characteristics: Postoperative without Neoadjuvant Therapy (Pathologic Staging), Invasive Disease, Adjuvant Therapy, HER2 Positive, ER Negative/Unknown, Node Negative, pT1b, pN0/N65m, Chemotherapy Indicated Therapeutic Status: Postoperative without Neoadjuvant Therapy (Pathologic Staging) AJCC Grade: G3 AJCC N Category: pN0 AJCC M Category: cM0 ER Status: Negative (-) AJCC 8 Stage Grouping: IA HER2 Status: Positive (+) Oncotype Dx Recurrence Score: Not Appropriate AJCC T Category: pT1b PR Status: Negative (-) Intent of Therapy: Curative Intent, Discussed with Patient

## 2018-07-13 NOTE — Telephone Encounter (Signed)
Gave avs and calendar waiting for 1/6

## 2018-07-13 NOTE — Research (Signed)
WF [id number], UNDERSTANDING AND PREDICTING BREAST CANCER EVENTS AFTER TREATMENT (UPBEAT STUDY).  [name] referred patient for the Upbeat study.  Met with patient and her daughter in exam room for 15 minutes. Briefly explained the purpose of this study and reviewed the study procedures and corresponding time points for completing.Informed patient thatall baseline activities including cardiac MRI, questionnaires, neurocognitive tests and physical functions tests will need to be completed prior to starting on treatment. Informed patient that participation is voluntary. Patient agreed to take a copy of the consent form home to review. Provided patient with a copy of study consentand hippa forms along with the study brochure for families. Also gave patient a clinical trials informational brochure and my business card.  Patient agreed for research nurse to call her later this week on Friday to discuss this study further and answer any questions.   We discussed patients schedule for next week and when she would be available for Cardiac MRI and other study activities if she decides to participate.  Informed patient she will need to meet with research nurse again to review and sign consent form prior to any study activities. Tentatively patient would like to participate in study activities including Cardiac MRI on 07/20/18. She has appt at [facility] at 2 pm that day.   Informed patient that research nurse will review her chart for eligibility for this study. Patient denies symptomatic claustrophobia or any other contraindications to having Cardiac MRI. She says she can walk ona treadmill and she can walk 2 blocks without chest pain, dyspnea, shortnessofbreath or fainting.  She does not remember why the diagnosis of PAH (pulmonary artery hypertension) is listed in her chart. She does not remember being told she has that or been treated for this. She says she thinks she has been in remission from the  Sarcoidosis for many years and is not currently on any treatment for this.  She walks one mile a day on her treadmill at home.  Thanked patient and her daughter very much for their time today and patient's willingness to consider this study.   Foye Spurling, BSN, RN Clinical Research Nurse 07/13/2018 12:20PM

## 2018-07-16 ENCOUNTER — Other Ambulatory Visit: Payer: Self-pay | Admitting: Radiology

## 2018-07-16 ENCOUNTER — Telehealth: Payer: Self-pay | Admitting: *Deleted

## 2018-07-16 ENCOUNTER — Other Ambulatory Visit: Payer: Self-pay | Admitting: Physician Assistant

## 2018-07-16 NOTE — Telephone Encounter (Signed)
Called patient to follow up on her interest in the [place] [id number] study. Patient states she has not had a chance to read the consent form or really think about the study.  She wants to read it over the weekend and states ok for research nurse to call her on Monday.  Informed patient of available MRI date/times for next week to think about if she does want to enroll on this study.  Thanked patient for her time and look forward to talking to her on Monday.  Foye Spurling, BSN, RN Clinical Research Nurse 07/16/2018 11:50 AM

## 2018-07-18 ENCOUNTER — Telehealth: Payer: Self-pay | Admitting: Radiology

## 2018-07-18 ENCOUNTER — Other Ambulatory Visit: Payer: Self-pay | Admitting: Radiology

## 2018-07-19 ENCOUNTER — Telehealth: Payer: Self-pay | Admitting: *Deleted

## 2018-07-19 ENCOUNTER — Ambulatory Visit (HOSPITAL_COMMUNITY)
Admission: RE | Admit: 2018-07-19 | Discharge: 2018-07-19 | Disposition: A | Discharge status: Home / Self Care | Payer: Medicare Other | Source: Ambulatory Visit | Attending: General Surgery | Admitting: General Surgery

## 2018-07-19 ENCOUNTER — Encounter (HOSPITAL_COMMUNITY): Payer: Self-pay

## 2018-07-19 DIAGNOSIS — I10 Essential (primary) hypertension: Secondary | ICD-10-CM | POA: Diagnosis not present

## 2018-07-19 DIAGNOSIS — Z8249 Family history of ischemic heart disease and other diseases of the circulatory system: Secondary | ICD-10-CM | POA: Insufficient documentation

## 2018-07-19 DIAGNOSIS — G4730 Sleep apnea, unspecified: Secondary | ICD-10-CM | POA: Insufficient documentation

## 2018-07-19 DIAGNOSIS — Z90710 Acquired absence of both cervix and uterus: Secondary | ICD-10-CM | POA: Diagnosis not present

## 2018-07-19 DIAGNOSIS — E785 Hyperlipidemia, unspecified: Secondary | ICD-10-CM | POA: Insufficient documentation

## 2018-07-19 DIAGNOSIS — C50411 Malignant neoplasm of upper-outer quadrant of right female breast: Secondary | ICD-10-CM | POA: Insufficient documentation

## 2018-07-19 DIAGNOSIS — Z171 Estrogen receptor negative status [ER-]: Secondary | ICD-10-CM | POA: Diagnosis not present

## 2018-07-19 DIAGNOSIS — Z888 Allergy status to other drugs, medicaments and biological substances status: Secondary | ICD-10-CM | POA: Diagnosis not present

## 2018-07-19 DIAGNOSIS — Z880 Allergy status to penicillin: Secondary | ICD-10-CM | POA: Insufficient documentation

## 2018-07-19 DIAGNOSIS — Z809 Family history of malignant neoplasm, unspecified: Secondary | ICD-10-CM | POA: Insufficient documentation

## 2018-07-19 DIAGNOSIS — I272 Pulmonary hypertension, unspecified: Secondary | ICD-10-CM | POA: Diagnosis not present

## 2018-07-19 HISTORY — PX: IR IMAGING GUIDED PORT INSERTION: IMG5740

## 2018-07-19 LAB — BASIC METABOLIC PANEL
Anion gap: 10 (ref 5–15)
BUN: 25 mg/dL — ABNORMAL HIGH (ref 8–23)
CO2: 24 mmol/L (ref 22–32)
Calcium: 9.3 mg/dL (ref 8.9–10.3)
Chloride: 106 mmol/L (ref 98–111)
Creatinine, Ser: 1.39 mg/dL — ABNORMAL HIGH (ref 0.44–1.00)
GFR calc Af Amer: 43 mL/min — ABNORMAL LOW (ref >60)
GFR calc non Af Amer: 38 mL/min — ABNORMAL LOW (ref >60)
Glucose, Bld: 106 mg/dL — ABNORMAL HIGH (ref 70–99)
Potassium: 3.8 mmol/L (ref 3.5–5.1)
Sodium: 140 mmol/L (ref 135–145)

## 2018-07-19 LAB — CBC
HCT: 38.7 % (ref 36.0–46.0)
HEMOGLOBIN: 12.8 g/dL (ref 12.0–15.0)
MCH: 30.6 pg (ref 26.0–34.0)
MCHC: 33.1 g/dL (ref 30.0–36.0)
MCV: 92.6 fL (ref 80.0–100.0)
Platelets: 212 10*3/uL (ref 150–400)
RBC: 4.18 MIL/uL (ref 3.87–5.11)
RDW: 13 % (ref 11.5–15.5)
WBC: 5.8 10*3/uL (ref 4.0–10.5)
nRBC: 0 % (ref 0.0–0.2)

## 2018-07-19 LAB — PROTIME-INR
INR: 1.07
Prothrombin Time: 13.8 seconds (ref 11.4–15.2)

## 2018-07-19 MED ORDER — VANCOMYCIN HCL IN DEXTROSE 1-5 GM/200ML-% IV SOLN: 1000.0000 mg | INTRAVENOUS | Status: DC

## 2018-07-19 MED ORDER — MIDAZOLAM HCL 2 MG/2ML IJ SOLN
INTRAMUSCULAR | Status: AC
  Filled 2018-07-19: qty 2

## 2018-07-19 MED ORDER — FENTANYL CITRATE (PF) 100 MCG/2ML IJ SOLN
INTRAMUSCULAR | Status: AC
  Filled 2018-07-19: qty 2

## 2018-07-19 MED ORDER — SODIUM CHLORIDE 0.9 % IV SOLN: INTRAVENOUS | Status: DC

## 2018-07-19 MED ORDER — VANCOMYCIN HCL IN DEXTROSE 1-5 GM/200ML-% IV SOLN
INTRAVENOUS | Status: AC
  Filled 2018-07-19: qty 200

## 2018-07-19 MED ORDER — FENTANYL CITRATE (PF) 100 MCG/2ML IJ SOLN
INTRAMUSCULAR | Status: DC | PRN
  Administered 2018-07-19: 25 ug via INTRAVENOUS

## 2018-07-19 MED ORDER — LIDOCAINE-EPINEPHRINE 2 %-1:100000 IJ SOLN
INTRAMUSCULAR | Status: DC | PRN
  Administered 2018-07-19: 20 mL

## 2018-07-19 MED ORDER — MIDAZOLAM HCL 2 MG/2ML IJ SOLN
INTRAMUSCULAR | Status: DC | PRN
  Administered 2018-07-19: 0.5 mg via INTRAVENOUS

## 2018-07-19 MED ORDER — LIDOCAINE-EPINEPHRINE (PF) 1 %-1:200000 IJ SOLN
INTRAMUSCULAR | Status: AC
  Filled 2018-07-19: qty 30

## 2018-07-19 MED ORDER — SODIUM CHLORIDE 0.9 % IV SOLN
INTRAVENOUS | Status: DC | PRN
  Administered 2018-07-19: 10 mL/h via INTRAVENOUS

## 2018-07-19 MED ORDER — HEPARIN SOD (PORK) LOCK FLUSH 100 UNIT/ML IV SOLN
INTRAVENOUS | Status: AC
  Filled 2018-07-19: qty 5

## 2018-07-19 NOTE — Telephone Encounter (Signed)
Called patient to follow up on the [place], [id number] study.  Patient states she has had time to think about it and it just feels like too much to do in addition to all her other appointments before starting on chemotherapy.  Thanked patient for taking the time to think about it.  Presented the DCP-001 study, informing patient this is a one time collection of demographic data and medical history from patients who were screened on NCI trials.  Asked patient if it is ok for research nurse to meet her in infusion room on 08/02/17 to give her more information. Informed patient this is also voluntary.  Patient agreed to discussing this study with research nurse on 08/02/17.   Foye Spurling, BSN, RN Clinical Research Nurse 07/19/2018 1:44 PM

## 2018-07-19 NOTE — Procedures (Signed)
Interventional Radiology Procedure Note  Procedure: Placement of a right IJ approach single lumen PowerPort.  Tip is positioned at the superior cavoatrial junction and catheter is ready for immediate use.  Complications: No immediate Recommendations:  - Ok to shower tomorrow - Do not submerge for 7 days - Routine line care   Signed,  Heath K. McCullough, MD   

## 2018-07-19 NOTE — H&P (Signed)
Chief Complaint: Patient was seen in consultation today for [facility] placement at the request of Rachel Kelly  Referring Physician(s): Rachel Kelly  Supervising Physician: Rachel Kelly  Patient Status: Rachel Kelly - Out-pt  History of Present Illness: Rachel Kelly is a 72 y.o. female   Newly diagnosed Breast Ca Chemo treatment  Scheduled for [facility] placement  Unsuccessful attempt at [facility] placement in OR 07/02/18    Past Medical History:  Diagnosis Date  . Anemia   . Blood transfusion 2005  . Hernia   . HH (hiatus hernia)   . Hyperlipidemia   . Hypertension   . PAH (pulmonary artery hypertension) ([place])   . Sarcoidosis   . Shortness of breath    on exertion  . Sleep apnea    does not use cpap  . Wheezing     Past Surgical History:  Procedure Laterality Date  . ABDOMINAL HYSTERECTOMY  1988  . BACK SURGERY  2005   lower  . BREAST LUMPECTOMY WITH RADIOACTIVE SEED AND SENTINEL LYMPH NODE BIOPSY Right 07/02/2018   Procedure: RIGHT BREAST RADIOACTIVE SEED LUMPECTOMY X2 AND RIGHT AXILLARY  SENTINEL LYMPH NODE BIOPSY;  Surgeon: Rachel Seltzer, Kelly;  Location: [facility];  Service: General;  Laterality: Right;  . ESOPHAGOGASTRODUODENOSCOPY  06/24/2012   Procedure: ESOPHAGOGASTRODUODENOSCOPY (EGD);  Surgeon: [name], Kelly;  Location: [name];  Service: General;  Laterality: N/A;  . HIATAL HERNIA REPAIR  08/18/2012   Procedure: LAPAROSCOPIC REPAIR OF HIATAL HERNIA;  Surgeon: [name], Kelly;  Location: [facility];  Service: General;  Laterality: N/A;  Laparoscopic Hiatal Hernia Repair   . INSERTION OF MESH  08/18/2012   Procedure: INSERTION OF MESH;  Surgeon: [name], Kelly;  Location: [facility];  Service: General;;  . PORTACATH PLACEMENT N/A 07/02/2018   Procedure: INSERTION PORT-A-CATH TRIED NO PORT IN PLACE;  Surgeon: Rachel Seltzer, Kelly;  Location: [facility];  Service: General;  Laterality: N/A;  . VESICOVAGINAL FISTULA CLOSURE W/ TAH       Allergies: Penicillins and Skin adhesives [cyanoacrylate]  Medications: Prior to Admission medications   Medication Sig Start Date End Date Taking? Authorizing Provider  Ascorbic Acid (VITAMIN C) 1000 MG tablet Take 1,000 mg by mouth every other day.    Yes Provider, Historical, Kelly  Cholecalciferol (VITAMIN D) 2000 UNITS CAPS Take 2,000 Units by mouth daily.    Yes Provider, Historical, Kelly  Coenzyme Q10 200 MG TABS Take 200 mg by mouth daily.   Yes Provider, Historical, Kelly  hydrocortisone 2.5 % cream Apply 1 application topically 2 (two) times daily as needed (dry skin).   Yes Provider, Historical, Kelly  ibuprofen (ADVIL,MOTRIN) 200 MG tablet Take 400 mg by mouth every 6 (six) hours as needed for headache or moderate pain.   Yes Provider, Historical, Kelly  ketotifen (ALAWAY) 0.025 % ophthalmic solution Place 1 drop into both eyes 2 (two) times daily.   Yes Provider, Historical, Kelly  loperamide (IMODIUM A-D) 2 MG capsule Take 2 mg by mouth daily as needed for diarrhea or loose stools.   Yes Provider, Historical, Kelly  omeprazole (PRILOSEC) 20 MG capsule Take 20 mg by mouth daily.    Yes Provider, Historical, Kelly  Polyvinyl Alcohol-Povidone PF (REFRESH) 1.4-0.6 % SOLN Place 1 drop into both eyes daily as needed (dry eyes).    Yes Provider, Historical, Kelly  potassium gluconate 595 MG TABS Take 595 mg by mouth daily.    Yes Provider, Historical, Kelly  simvastatin (ZOCOR) 20  MG tablet Take 20 mg by mouth every morning.    Yes Provider, Historical, Kelly  triamterene-hydrochlorothiazide (MAXZIDE-25) 37.5-25 MG per tablet Take 1 tablet by mouth every morning.    Yes Provider, Historical, Kelly  lidocaine-prilocaine (EMLA) cream Apply to affected area once 07/13/18   Rachel Kelly  prochlorperazine (COMPAZINE) 10 MG tablet Take 1 tablet (10 mg total) by mouth every 6 (six) hours as needed (Nausea or vomiting). 07/13/18   Rachel Kelly  traMADol (ULTRAM) 50 MG tablet Take 1 tablet (50 mg total) by  mouth every 6 (six) hours as needed. 07/02/18   Rachel Seltzer, Kelly     Family History  Problem Relation Age of Onset  . Clotting disorder Mother        PE  . Heart disease Father   . Cancer Maternal Grandmother        not sure type, but spread to the brain.  . Coronary artery disease Other   . Cancer Other        ? type    Social History   Socioeconomic History  . Marital status: Single    Spouse name: Not on file  . Number of children: Not on file  . Years of education: Not on file  . Highest education level: Not on file  Occupational History  . Occupation: Retired Tour manager  . Financial resource strain: Not on file  . Food insecurity:    Worry: Not on file    Inability: Not on file  . Transportation needs:    Medical: Not on file    Non-medical: Not on file  Tobacco Use  . Smoking status: Never Smoker  . Smokeless tobacco: Never Used  Substance and Sexual Activity  . Alcohol use: No  . Drug use: No  . Sexual activity: Not on file  Lifestyle  . Physical activity:    Days per week: Not on file    Minutes per session: Not on file  . Stress: Not on file  Relationships  . Social connections:    Talks on phone: Not on file    Gets together: Not on file    Attends religious service: Not on file    Active member of club or organization: Not on file    Attends meetings of clubs or organizations: Not on file    Relationship status: Not on file  Other Topics Concern  . Not on file  Social History Narrative  . Not on file    Review of Systems: A 12 point ROS discussed and pertinent positives are indicated in the HPI above.  All other systems are negative.  Review of Systems  Constitutional: Positive for fatigue. Negative for fever.  Respiratory: Negative for shortness of breath.   Cardiovascular: Negative for chest pain.  Gastrointestinal: Negative for abdominal pain.  Neurological: Negative for weakness.  Psychiatric/Behavioral: Negative for  behavioral problems and confusion.    Vital Signs: BP 125/81   Pulse 67   Temp 97.8 F (36.6 C) (Oral)   Resp 16   Ht 4' 11"  (1.499 m)   Wt 169 lb (76.7 kg)   SpO2 99%   BMI 34.13 kg/m   Physical Exam Vitals signs reviewed.  Cardiovascular:     Rate and Rhythm: Normal rate and regular rhythm.  Pulmonary:     Effort: Pulmonary effort is normal.     Breath sounds: Normal breath sounds.  Abdominal:     General: Bowel sounds are  normal.     Palpations: Abdomen is soft.  Musculoskeletal: Normal range of motion.  Skin:    General: Skin is warm and dry.  Neurological:     General: No focal deficit present.     Mental Status: She is oriented to person, place, and time.  Psychiatric:        Mood and Affect: Mood normal.        Behavior: Behavior normal.        Thought Content: Thought content normal.        Judgment: Judgment normal.     Imaging: Nm Sentinel Node Inj-no Rpt (breast)  Result Date: 07/02/2018 Sulfur colloid was injected by the nuclear medicine technologist for melanoma sentinel node.   Mm Breast Surgical Specimen  Result Date: 07/02/2018 CLINICAL DATA:  Status post surgical excision of a second right breast lesion following radioactive seed localization. EXAM: SPECIMEN RADIOGRAPH OF THE RIGHT BREAST COMPARISON:  Previous exam(s). FINDINGS: Status post excision of the right breast. The radioactive seed and biopsy marker clip are present, completely intact, and were marked for pathology. IMPRESSION: Specimen radiograph of the right breast. Electronically Signed   By: Lajean Manes M.D.   On: 07/02/2018 10:06   Mm Breast Surgical Specimen  Result Date: 07/02/2018 CLINICAL DATA:  Status post surgical excision of a right breast lesion following radioactive seed localization. EXAM: SPECIMEN RADIOGRAPH OF THE RIGHT BREAST COMPARISON:  Previous exam(s). FINDINGS: Status post excision of the right breast. The radioactive seed and biopsy marker clip are present,  completely intact, and were marked for pathology. IMPRESSION: Specimen radiograph of the right breast. Electronically Signed   By: Lajean Manes M.D.   On: 07/02/2018 10:00   Dg Chest Port 1 View  Result Date: 07/02/2018 CLINICAL DATA:  Attempted Port-A-Cath placement. EXAM: PORTABLE CHEST 1 VIEW COMPARISON:  Radiographs of Nov 21, 2012. FINDINGS: Stable cardiomediastinal silhouette. Stable right paratracheal calcified adenopathy is noted. No pneumothorax is noted. Mild bibasilar subsegmental atelectasis is noted. Bony thorax is unremarkable. No significant pleural effusion is noted. IMPRESSION: Mild bibasilar subsegmental atelectasis. Electronically Signed   By: Marijo Conception, M.D.   On: 07/02/2018 11:47   Mm Rt Radioactive Seed Loc Mammo Guide  Result Date: 07/01/2018 CLINICAL DATA:  Patient presents for radioactive seed localization of an area of breast carcinoma, marked with a ribbon shaped biopsy clip, in the lateral right breast, as well as radioactive seed localization of an area of possible papilloma, marked with a coil shaped clip, in the more anterior, lateral right breast. EXAM: MAMMOGRAPHIC GUIDED RADIOACTIVE SEED LOCALIZATION OF THE RIGHT BREAST: 2 BIOPSY CLIPS IN THE RIGHT BREAST WERE LOCALIZED. COMPARISON:  Previous exam(s). FINDINGS: Patient presents for radioactive seed localization prior to surgical excision. I met with the patient and we discussed the procedure of seed localization including benefits and alternatives. We discussed the high likelihood of a successful procedure. We discussed the risks of the procedure including infection, bleeding, tissue injury and further surgery. We discussed the low dose of radioactivity involved in the procedure. Informed, written consent was given. The usual time-out protocol was performed immediately prior to the procedure. Ribbon shaped clip: Using mammographic guidance, sterile technique, 1% lidocaine and an I-125 radioactive seed, the ribbon  shaped biopsy clip was localized using a lateral approach. Order number of I-125 seed:  [id number]. Total activity:  8.469 millicuries reference Date: 06/11/2018 Coil shaped clip: Using mammographic guidance, sterile technique, 1% lidocaine and an I-125 radioactive seed, the coil shaped biopsy clip  was localized using a lateral approach. Order number of I-125 seed:  [id number]. Total activity:  0.865 millicuries reference Date: 06/11/2018 The follow-up mammogram images confirm the seed in the expected location and were marked for Rachel. Excell Kelly. Follow-up survey of the patient confirms presence of both radioactive seeds. The patient tolerated the procedure well and was released from the [facility]. She was given instructions regarding seed removal. IMPRESSION: Radioactive seed localization of 2 lesions in the right breast. No apparent complications. Electronically Signed   By: Lajean Manes M.D.   On: 07/01/2018 15:04   Mm Rt Radio Seed Ea Add Lesion Loc Mammo  Result Date: 07/01/2018 CLINICAL DATA:  Patient presents for radioactive seed localization of an area of breast carcinoma, marked with a ribbon shaped biopsy clip, in the lateral right breast, as well as radioactive seed localization of an area of possible papilloma, marked with a coil shaped clip, in the more anterior, lateral right breast. EXAM: MAMMOGRAPHIC GUIDED RADIOACTIVE SEED LOCALIZATION OF THE RIGHT BREAST: 2 BIOPSY CLIPS IN THE RIGHT BREAST WERE LOCALIZED. COMPARISON:  Previous exam(s). FINDINGS: Patient presents for radioactive seed localization prior to surgical excision. I met with the patient and we discussed the procedure of seed localization including benefits and alternatives. We discussed the high likelihood of a successful procedure. We discussed the risks of the procedure including infection, bleeding, tissue injury and further surgery. We discussed the low dose of radioactivity involved in the procedure. Informed, written consent was  given. The usual time-out protocol was performed immediately prior to the procedure. Ribbon shaped clip: Using mammographic guidance, sterile technique, 1% lidocaine and an I-125 radioactive seed, the ribbon shaped biopsy clip was localized using a lateral approach. Order number of I-125 seed:  [id number]. Total activity:  2.841 millicuries reference Date: 06/11/2018 Coil shaped clip: Using mammographic guidance, sterile technique, 1% lidocaine and an I-125 radioactive seed, the coil shaped biopsy clip was localized using a lateral approach. Order number of I-125 seed:  [id number]. Total activity:  2.536 millicuries reference Date: 06/11/2018 The follow-up mammogram images confirm the seed in the expected location and were marked for Rachel. Excell Kelly. Follow-up survey of the patient confirms presence of both radioactive seeds. The patient tolerated the procedure well and was released from the Breast Center. She was given instructions regarding seed removal. IMPRESSION: Radioactive seed localization of 2 lesions in the right breast. No apparent complications. Electronically Signed   By: Lajean Manes M.D.   On: 07/01/2018 15:04    Labs:  CBC: Recent Labs    06/09/18 1239 06/29/18 1328 07/19/18 0710  WBC 5.2 4.3 5.8  HGB 13.0 12.8 12.8  HCT 39.7 40.6 38.7  PLT 217 196 212    COAGS: Recent Labs    07/19/18 0710  INR 1.07    BMP: Recent Labs    06/09/18 1239 06/29/18 1328 07/19/18 0710  NA 141 137 140  K 4.6 3.7 3.8  CL 106 103 106  CO2 28 24 24   GLUCOSE 97 130* 106*  BUN 17 16 25*  CALCIUM 10.0 9.4 9.3  CREATININE 1.27* 1.27* 1.39*  GFRNONAA 41* 42* 38*  GFRAA 47* 48* 43*    LIVER FUNCTION TESTS: Recent Labs    06/09/18 1239  BILITOT 0.6  AST 23  ALT 15  ALKPHOS 78  PROT 7.7  ALBUMIN 3.9    TUMOR MARKERS: No results for input(s): AFPTM, CEA, CA199, CHROMGRNA in the last 8760 hours.  Assessment and Plan:  Newly Dx Breast Ca For  Port a cath placement this am Risks  and benefits of image guided port-a-catheter placement was discussed with the patient including, but not limited to bleeding, infection, pneumothorax, or fibrin sheath development and need for additional procedures.  All of the patient's questions were answered, patient is agreeable to proceed. Consent signed and in chart.   Thank you for this interesting consult.  I greatly enjoyed meeting SHENEA GIACOBBE and look forward to participating in their care.  A copy of this report was sent to the requesting provider on this date.  Electronically Signed: Lavonia Drafts, PA-C 07/19/2018, 8:25 AM   I spent a total of  30 Minutes   in face to face in clinical consultation, greater than 50% of which was counseling/coordinating care for [facility] placement

## 2018-07-19 NOTE — Discharge Instructions (Signed)
Implanted Port Home Guide  An implanted port is a device that is placed under the skin. It is usually placed in the chest. The device can be used to give IV medicine, to take blood, or for dialysis. You may have an implanted port if:  · You need IV medicine that would be irritating to the small veins in your hands or arms.  · You need IV medicines, such as antibiotics, for a long period of time.  · You need IV nutrition for a long period of time.  · You need dialysis.  Having a port means that your health care provider will not need to use the veins in your arms for these procedures. You may have fewer limitations when using a port than you would if you used other types of long-term IVs, and you will likely be able to return to normal activities after your incision heals.  An implanted port has two main parts:  · Reservoir. The reservoir is the part where a needle is inserted to give medicines or draw blood. The reservoir is round. After it is placed, it appears as a small, raised area under your skin.  · Catheter. The catheter is a thin, flexible tube that connects the reservoir to a vein. Medicine that is inserted into the reservoir goes into the catheter and then into the vein.  How is my port accessed?  To access your port:  · A numbing cream may be placed on the skin over the port site.  · Your health care provider will put on a mask and sterile gloves.  · The skin over your port will be cleaned carefully with a germ-killing soap and allowed to dry.  · Your health care provider will gently pinch the port and insert a needle into it.  · Your health care provider will check for a blood return to make sure the port is in the vein and is not clogged.  · If your port needs to remain accessed to get medicine continuously (constant infusion), your health care provider will place a clear bandage (dressing) over the needle site. The dressing and needle will need to be changed every week, or as told by your health care  provider.  What is flushing?  Flushing helps keep the port from getting clogged. Follow instructions from your health care provider about how and when to flush the port. Ports are usually flushed with saline solution or a medicine called heparin. The need for flushing will depend on how the port is used:  · If the port is only used from time to time to give medicines or draw blood, the port may need to be flushed:  ? Before and after medicines have been given.  ? Before and after blood has been drawn.  ? As part of routine maintenance. Flushing may be recommended every 4-6 weeks.  · If a constant infusion is running, the port may not need to be flushed.  · Throw away any syringes in a disposal container that is meant for sharp items (sharps container). You can buy a sharps container from a pharmacy, or you can make one by using an empty hard plastic bottle with a cover.  How long will my port stay implanted?  The port can stay in for as long as your health care provider thinks it is needed. When it is time for the port to come out, a surgery will be done to remove it. The surgery will be similar to   the procedure that was done to put the port in.  Follow these instructions at home:    · Flush your port as told by your health care provider.  · If you need an infusion over several days, follow instructions from your health care provider about how to take care of your port site. Make sure you:  ? Wash your hands with soap and water before you change your dressing. If soap and water are not available, use alcohol-based hand sanitizer.  ? Change your dressing as told by your health care provider.  ? Place any used dressings or infusion bags into a plastic bag. Throw that bag in the trash.  ? Keep the dressing that covers the needle clean and dry. Do not get it wet.  ? Do not use scissors or sharp objects near the tube.  ? Keep the tube clamped, unless it is being used.  · Check your port site every day for signs of  infection. Check for:  ? Redness, swelling, or pain.  ? Fluid or blood.  ? Pus or a bad smell.  · Protect the skin around the port site.  ? Avoid wearing bra straps that rub or irritate the site.  ? Protect the skin around your port from seat belts. Place a soft pad over your chest if needed.  · Bathe or shower as told by your health care provider. The site may get wet as long as you are not actively receiving an infusion.  · Return to your normal activities as told by your health care provider. Ask your health care provider what activities are safe for you.  · Carry a medical alert card or wear a medical alert bracelet at all times. This will let health care providers know that you have an implanted port in case of an emergency.  Get help right away if:  · You have redness, swelling, or pain at the port site.  · You have fluid or blood coming from your port site.  · You have pus or a bad smell coming from the port site.  · You have a fever.  Summary  · Implanted ports are usually placed in the chest for long-term IV access.  · Follow instructions from your health care provider about flushing the port and changing bandages (dressings).  · Take care of the area around your port by avoiding clothing that puts pressure on the area, and by watching for signs of infection.  · Protect the skin around your port from seat belts. Place a soft pad over your chest if needed.  · Get help right away if you have a fever or you have redness, swelling, pain, drainage, or a bad smell at the port site.  This information is not intended to replace advice given to you by your health care provider. Make sure you discuss any questions you have with your health care provider.  Document Released: 07/07/2005 Document Revised: 08/09/2016 Document Reviewed: 08/09/2016  Elsevier Interactive Patient Education © 2019 Else[employer] Inc[employer]  Implanted Port Insertion, Care After  This sheet gives you information about how to care for yourself after your  procedure. Your health care provider may also give you more specific instructions. If you have problems or questions, contact your health care provider.  What can I expect after the procedure?  After the procedure, it is common to have:  · Discomfort at the port insertion site.  · Bruising on the skin over the port. This should improve   over 3-4 days.  Follow these instructions at home:  Port care  · After your port is placed, you will get a manufacturer's information card. The card has information about your port. Keep this card with you at all times.  · Take care of the port as told by your health care provider. Ask your health care provider if you or a family member can get training for taking care of the port at home. A home health care nurse may also take care of the port.  · Make sure to remember what type of port you have.  Incision care         · Follow instructions from your health care provider about how to take care of your port insertion site. Make sure you:  ? Wash your hands with soap and water before and after you change your bandage (dressing). If soap and water are not available, use hand sanitizer.  ? Change your dressing as told by your health care provider.  ? Leave stitches (sutures), skin glue, or adhesive strips in place. These skin closures may need to stay in place for 2 weeks or longer. If adhesive strip edges start to loosen and curl up, you may trim the loose edges. Do not remove adhesive strips completely unless your health care provider tells you to do that.  · Check your port insertion site every day for signs of infection. Check for:  ? Redness, swelling, or pain.  ? Fluid or blood.  ? Warmth.  ? Pus or a bad smell.  Activity  · Return to your normal activities as told by your health care provider. Ask your health care provider what activities are safe for you.  · Do not lift anything that is heavier than 10 lb (4.5 kg), or the limit that you are told, until your health care provider  says that it is safe.  General instructions  · Take over-the-counter and prescription medicines only as told by your health care provider.  · Do not take baths, swim, or use a hot tub until your health care provider approves. Ask your health care provider if you may take showers. You may only be allowed to take sponge baths.  · Do not drive for 24 hours if you were given a sedative during your procedure.  · Wear a medical alert bracelet in case of an emergency. This will tell any health care providers that you have a port.  · Keep all follow-up visits as told by your health care provider. This is important.  Contact a health care provider if:  · You cannot flush your port with saline as directed, or you cannot draw blood from the port.  · You have a fever or chills.  · You have redness, swelling, or pain around your port insertion site.  · You have fluid or blood coming from your port insertion site.  · Your port insertion site feels warm to the touch.  · You have pus or a bad smell coming from the port insertion site.  Get help right away if:  · You have chest pain or shortness of breath.  · You have bleeding from your port that you cannot control.  Summary  · Take care of the port as told by your health care provider. Keep the manufacturer's information card with you at all times.  · Change your dressing as told by your health care provider.  · Contact a health care provider if you have a   fever or chills or if you have redness, swelling, or pain around your port insertion site.  · Keep all follow-up visits as told by your health care provider.  This information is not intended to replace advice given to you by your health care provider. Make sure you discuss any questions you have with your health care provider.  Document Released: 04/27/2013 Document Revised: 02/02/2018 Document Reviewed: 02/02/2018  Elsevier Interactive Patient Education © 2019 Elsevier Inc.  Moderate Conscious Sedation, Adult, Care After  These  instructions provide you with information about caring for yourself after your procedure. Your health care provider may also give you more specific instructions. Your treatment has been planned according to current medical practices, but problems sometimes occur. Call your health care provider if you have any problems or questions after your procedure.  What can I expect after the procedure?  After your procedure, it is common:  · To feel sleepy for several hours.  · To feel clumsy and have poor balance for several hours.  · To have poor judgment for several hours.  · To vomit if you eat too soon.  Follow these instructions at home:  For at least 24 hours after the procedure:    · Do not:  ? Participate in activities where you could fall or become injured.  ? Drive.  ? Use heavy machinery.  ? Drink alcohol.  ? Take sleeping pills or medicines that cause drowsiness.  ? Make important decisions or sign legal documents.  ? Take care of children on your own.  · Rest.  Eating and drinking  · Follow the diet recommended by your health care provider.  · If you vomit:  ? Drink water, juice, or soup when you can drink without vomiting.  ? Make sure you have little or no nausea before eating solid foods.  General instructions  · Have a responsible adult stay with you until you are awake and alert.  · Take over-the-counter and prescription medicines only as told by your health care provider.  · If you smoke, do not smoke without supervision.  · Keep all follow-up visits as told by your health care provider. This is important.  Contact a health care provider if:  · You keep feeling nauseous or you keep vomiting.  · You feel light-headed.  · You develop a rash.  · You have a fever.  Get help right away if:  · You have trouble breathing.  This information is not intended to replace advice given to you by your health care provider. Make sure you discuss any questions you have with your health care provider.  Document Released:  04/27/2013 Document Revised: 12/10/2015 Document Reviewed: 10/27/2015  Elsevier Interactive Patient Education © 2019 Elsevier Inc.

## 2018-07-22 ENCOUNTER — Telehealth: Payer: Self-pay | Admitting: Oncology

## 2018-07-22 ENCOUNTER — Ambulatory Visit (HOSPITAL_COMMUNITY)
Admission: RE | Admit: 2018-07-22 | Discharge: 2018-07-22 | Disposition: A | Discharge status: Home / Self Care | Payer: Medicare Other | Source: Ambulatory Visit | Attending: Oncology | Admitting: Oncology

## 2018-07-22 DIAGNOSIS — I10 Essential (primary) hypertension: Secondary | ICD-10-CM | POA: Insufficient documentation

## 2018-07-22 DIAGNOSIS — E785 Hyperlipidemia, unspecified: Secondary | ICD-10-CM | POA: Diagnosis not present

## 2018-07-22 DIAGNOSIS — I071 Rheumatic tricuspid insufficiency: Secondary | ICD-10-CM | POA: Insufficient documentation

## 2018-07-22 DIAGNOSIS — C50411 Malignant neoplasm of upper-outer quadrant of right female breast: Secondary | ICD-10-CM | POA: Diagnosis not present

## 2018-07-22 DIAGNOSIS — Z171 Estrogen receptor negative status [ER-]: Secondary | ICD-10-CM | POA: Insufficient documentation

## 2018-07-22 NOTE — Progress Notes (Signed)
  Echocardiogram 2D Echocardiogram has been performed.  Jennette Dubin 07/22/2018, 12:04 PM

## 2018-07-22 NOTE — Telephone Encounter (Signed)
Called regarding 1/7 left a message

## 2018-07-23 ENCOUNTER — Telehealth: Payer: Self-pay

## 2018-07-23 ENCOUNTER — Other Ambulatory Visit: Payer: Self-pay | Admitting: Oncology

## 2018-07-23 NOTE — Telephone Encounter (Signed)
Returned patient's call regarding time of future appointments.  Questions answered, no further needs.

## 2018-07-26 ENCOUNTER — Other Ambulatory Visit: Payer: Self-pay

## 2018-07-26 ENCOUNTER — Encounter: Payer: Self-pay | Admitting: Physical Therapy

## 2018-07-26 ENCOUNTER — Ambulatory Visit: Payer: Medicare Other | Attending: General Surgery | Admitting: Physical Therapy

## 2018-07-26 ENCOUNTER — Encounter: Payer: Medicare Other | Admitting: Physical Therapy

## 2018-07-26 DIAGNOSIS — R600 Localized edema: Secondary | ICD-10-CM | POA: Insufficient documentation

## 2018-07-26 DIAGNOSIS — C50411 Malignant neoplasm of upper-outer quadrant of right female breast: Secondary | ICD-10-CM | POA: Diagnosis not present

## 2018-07-26 DIAGNOSIS — Z483 Aftercare following surgery for neoplasm: Secondary | ICD-10-CM | POA: Diagnosis present

## 2018-07-26 DIAGNOSIS — Z171 Estrogen receptor negative status [ER-]: Secondary | ICD-10-CM | POA: Diagnosis present

## 2018-07-26 DIAGNOSIS — R293 Abnormal posture: Secondary | ICD-10-CM | POA: Insufficient documentation

## 2018-07-26 NOTE — Therapy (Signed)
[place], [place], [address] Phone: [phone]   Fax:  [phone]  Physical Therapy Treatment  Patient Details  Name: Rachel Kelly MRN: [id number] Date of Birth: [birth year] Referring Provider (PT): Dr. [name]   Encounter Date: 07/26/2018  PT End of Session - 07/26/18 1416    Visit Number  2    Number of Visits  10    Date for PT Re-Evaluation  08/23/18    PT Start Time  7322    PT Stop Time  1350    PT Time Calculation (min)  45 min    Activity Tolerance  Patient tolerated treatment well    Behavior During Therapy  [facility] for tasks assessed/performed       Past Medical History:  Diagnosis Date  . Anemia   . Blood transfusion 2005  . Hernia   . HH (hiatus hernia)   . Hyperlipidemia   . Hypertension   . PAH (pulmonary artery hypertension) ([place])   . Sarcoidosis   . Shortness of breath    on exertion  . Sleep apnea    does not use cpap  . Wheezing     Past Surgical History:  Procedure Laterality Date  . ABDOMINAL HYSTERECTOMY  1988  . BACK SURGERY  2005   lower  . BREAST LUMPECTOMY WITH RADIOACTIVE SEED AND SENTINEL LYMPH NODE BIOPSY Right 07/02/2018   Procedure: RIGHT BREAST RADIOACTIVE SEED LUMPECTOMY X2 AND RIGHT AXILLARY  SENTINEL LYMPH NODE BIOPSY;  Surgeon: [name], MD;  Location: [facility];  Service: General;  Laterality: Right;  . ESOPHAGOGASTRODUODENOSCOPY  06/24/2012   Procedure: ESOPHAGOGASTRODUODENOSCOPY (EGD);  Surgeon: [name], MD;  Location: [name];  Service: General;  Laterality: N/A;  . HIATAL HERNIA REPAIR  08/18/2012   Procedure: LAPAROSCOPIC REPAIR OF HIATAL HERNIA;  Surgeon: [name], MD;  Location: [facility];  Service: General;  Laterality: N/A;  Laparoscopic Hiatal Hernia Repair   . INSERTION OF MESH  08/18/2012   Procedure: INSERTION OF MESH;  Surgeon: [name], MD;  Location: [facility];  Service: General;;  . IR IMAGING  GUIDED PORT INSERTION  07/19/2018  . PORTACATH PLACEMENT N/A 07/02/2018   Procedure: INSERTION PORT-A-CATH TRIED NO PORT IN PLACE;  Surgeon: [name], MD;  Location: [facility];  Service: General;  Laterality: N/A;  . VESICOVAGINAL FISTULA CLOSURE W/ TAH      There were no vitals filed for this visit.  Subjective Assessment - 07/26/18 1307    Subjective  Patient underwent a right lumpectomy and sentinel node biopsy (0/2 nodes positive) on 07/02/18. She has HER2 positive breast cancer so she will undergo chemotherapy followed by radiation.    Pertinent History  Patient was diagnosed on 05/26/18 with right ER/PR negative and HER2 positive invasive ductal carcinoma breast cancer. Patient underwent a right lumpectomy and sentinel node biopsy (0/2 nodes positive) on 07/02/18. Ki67 is 10%.    Patient Stated Goals  See if I'm doing ok with my arm    Currently in Pain?  Yes    Pain Score  1     Pain Location  Breast    Pain Orientation  Right    Pain Descriptors / Indicators  Other (Comment)   Sensitivity   Pain Type  Surgical pain    Pain Onset  1 to 4 weeks ago    Pain Frequency  Intermittent    Aggravating Factors   Bra    Pain Relieving  Factors  Wearing different bra    Multiple Pain Sites  No         [facility] PT Assessment - 07/26/18 0001      Assessment   Medical Diagnosis  s/p right lumpectomy and sentinel node biopsy    Referring Provider (PT)  Dr. [name]    Onset Date/Surgical Date  07/02/18    Hand Dominance  Left    Prior Therapy  Baseline assessment      Precautions   Precautions  Other (comment)    Precaution Comments  recent cancer urgery      Restrictions   Weight Bearing Restrictions  No      Balance Screen   Has the patient fallen in the past 6 months  No    Has the patient had a decrease in activity level because of a fear of falling?   No    Is the patient reluctant to leave their home because of a fear of falling?   No      Home Academic librarian  Private residence    Living Arrangements  Alone    Available Help at Discharge  Family      Prior Function   Level of Independence  Independent    Vocation  Retired    Biomedical scientist  Retired Patent examiner    Leisure  She is walking 25 minutes each day      Cognition   Overall Cognitive Status  Within Functional Limits for tasks assessed      Observation/Other Assessments   Observations  Significant edema present in right breast which feels thick and fibrotic. This is also present around her axillary incision which is somewhat sensitive to touch.      Posture/Postural Control   Posture/Postural Control  Postural limitations    Postural Limitations  Forward head;Rounded Shoulders      AROM   AROM Assessment Site  Shoulder    Right/Left Shoulder  Right    Right Shoulder Extension  37 Degrees    Right Shoulder Flexion  131 Degrees    Right Shoulder ABduction  132 Degrees    Right Shoulder Internal Rotation  61 Degrees    Right Shoulder External Rotation  81 Degrees      Strength   Overall Strength  Within functional limits for tasks performed        LYMPHEDEMA/ONCOLOGY QUESTIONNAIRE - 07/26/18 1312      Type   Cancer Type  Right breast cancer      Surgeries   Lumpectomy Date  07/02/18    Sentinel Lymph Node Biopsy Date  07/02/18    Number Lymph Nodes Removed  2      Treatment   Active Chemotherapy Treatment  No   Will begin soon   Past Chemotherapy Treatment  No    Active Radiation Treatment  No    Past Radiation Treatment  No    Current Hormone Treatment  No    Past Hormone Therapy  No      Lymphedema Assessments   Lymphedema Assessments  Upper extremities      Right Upper Extremity Lymphedema   10 cm Proximal to Olecranon Process  34.6 cm    Olecranon Process  25.8 cm    10 cm Proximal to Ulnar Styloid Process  24.3 cm    Just Proximal to Ulnar Styloid Process  15.8 cm    Across Hand at [employer]  18.8 cm  At  [facility] of 2nd Digit  6.4 cm      Left Upper Extremity Lymphedema   10 cm Proximal to Olecranon Process  34.2 cm    Olecranon Process  25.9 cm    10 cm Proximal to Ulnar Styloid Process  24.1 cm    Just Proximal to Ulnar Styloid Process  16.5 cm    Across Hand at [employer]  18.8 cm    At [place] of 2nd Digit  6.2 cm        Quick Dash - 07/26/18 0001    Open a tight or new jar  Moderate difficulty    Do heavy household chores (wash walls, wash floors)  Mild difficulty    Carry a shopping bag or briefcase  No difficulty    Wash your back  No difficulty    Use a knife to cut food  No difficulty    Recreational activities in which you take some force or impact through your arm, shoulder, or hand (golf, hammering, tennis)  No difficulty    During the past week, to what extent has your arm, shoulder or hand problem interfered with your normal social activities with family, friends, neighbors, or groups?  Slightly    During the past week, to what extent has your arm, shoulder or hand problem limited your work or other regular daily activities  Not at all    Arm, shoulder, or hand pain.  Mild    Tingling (pins and needles) in your arm, shoulder, or hand  None    Difficulty Sleeping  No difficulty    DASH Score  11.36 %                     PT Education - 07/26/18 1415    Education Details  Educated patient on seromas and lymphedema risk reduction    Person(s) Educated  Patient    Methods  Explanation    Comprehension  Verbalized understanding          PT Long Term Goals - 07/26/18 1419      PT LONG TERM GOAL #1   Title  Patient will demonstrate she has returned to baseline realted to shoulder function and ROM post operatively.    Time  8    Period  Weeks    Status  Achieved      PT LONG TERM GOAL #2   Title  Patient will report >/= 25% reduction in the sensitivity around her breast and axillary incisions.    Time  4    Period  Weeks    Status  New      PT  LONG TERM GOAL #3   Title  Patient will report >/= 40% reduction in right breast edema and fullness to fit more comfortably in her bras.    Time  4    Period  Weeks    Status  New      PT LONG TERM GOAL #4   Title  Patient will demonstrate self manual lymph drainage for management of her edema.    Time  4    Period  Weeks    Status  New            Plan - 07/26/18 1416    Clinical Impression Statement  Patient is doing very well functionally s/p right lumpectomy and sentinel node biopsy. Her shoulder ROM and strength appears to be nearly back to baseline and there are no signs  of lymphedema in her arm. She does have significant edema present in her right breast and around her axillary incision that may benefit from physical therapy. She will begin chemotherapy tomorrow as she is HER2 positive but would like to try therapy to reduce her swelling.    History and Personal Factors relevant to plan of care:  Lives alone    Clinical Presentation  Stable    Rehab Potential  Excellent    Clinical Impairments Affecting Rehab Potential  None currently - will begin chemo 07/27/2018 so that may effect her ability to attend PT appointments    PT Frequency  2x / week    PT Duration  4 weeks    PT Treatment/Interventions  ADLs/Self Care Home Management;Therapeutic exercise;Patient/family education;Manual lymph drainage;Scar mobilization;Passive range of motion;Manual techniques;Taping    PT Next Visit Plan  Breast manual lymph drainage and scar mobilization    PT Home Exercise Plan  Post op shoulder ROM HEP    Consulted and Agree with Plan of Care  Patient       Patient will benefit from skilled therapeutic intervention in order to improve the following deficits and impairments:  Postural dysfunction, Decreased range of motion, Decreased knowledge of precautions, Impaired UE functional use, Pain  Visit Diagnosis: Malignant neoplasm of upper-outer quadrant of right breast in female, estrogen  receptor negative ([place]) - Plan: PT plan of care cert/re-cert  Abnormal posture - Plan: PT plan of care cert/re-cert  Aftercare following surgery for neoplasm - Plan: PT plan of care cert/re-cert  Localized edema - Plan: PT plan of care cert/re-cert     Problem List Patient Active Problem List   Diagnosis Date Noted  . Pre-op evaluation 06/24/2018  . Malignant neoplasm of upper-outer quadrant of right breast in female, estrogen receptor negative ([place]) 06/07/2018  . Nausea & vomiting 11/21/2012  . Bilateral renal masses 11/21/2012  . Back pain, thoracic 08/14/2012  . Obstructive sleep apnea 05/09/2012  . GERD (gastroesophageal reflux disease) 06/28/2010  . HYPERLIPIDEMIA 12/09/2007  . HYPERTENSION 12/09/2007  . Sarcoidosis 11/26/2007  . DYSPNEA 11/26/2007   Annia Friendly, PT 07/26/18 2:22 PM  [place], [place], [address] Phone: [phone]   Fax:  [phone]  Name: Rachel Kelly MRN: [id number] Date of Birth: [birth year]

## 2018-07-27 ENCOUNTER — Inpatient Hospital Stay: Payer: Medicare Other | Attending: Oncology

## 2018-07-27 ENCOUNTER — Other Ambulatory Visit: Payer: Self-pay | Admitting: Medical

## 2018-07-27 ENCOUNTER — Inpatient Hospital Stay (HOSPITAL_BASED_OUTPATIENT_CLINIC_OR_DEPARTMENT_OTHER): Payer: Medicare Other | Admitting: Medical

## 2018-07-27 ENCOUNTER — Inpatient Hospital Stay: Payer: Medicare Other

## 2018-07-27 ENCOUNTER — Other Ambulatory Visit: Payer: Medicare Other

## 2018-07-27 VITALS — BP 118/74 | HR 68 | Temp 98.6°F | Resp 18

## 2018-07-27 DIAGNOSIS — Z171 Estrogen receptor negative status [ER-]: Principal | ICD-10-CM

## 2018-07-27 DIAGNOSIS — C50411 Malignant neoplasm of upper-outer quadrant of right female breast: Secondary | ICD-10-CM | POA: Insufficient documentation

## 2018-07-27 DIAGNOSIS — Z5111 Encounter for antineoplastic chemotherapy: Secondary | ICD-10-CM | POA: Insufficient documentation

## 2018-07-27 DIAGNOSIS — G620 Drug-induced polyneuropathy: Secondary | ICD-10-CM | POA: Insufficient documentation

## 2018-07-27 DIAGNOSIS — R300 Dysuria: Secondary | ICD-10-CM

## 2018-07-27 LAB — CMP (CANCER CENTER ONLY)
ALT: 13 U/L (ref 0–44)
AST: 21 U/L (ref 15–41)
Albumin: 3.6 g/dL (ref 3.5–5.0)
Alkaline Phosphatase: 79 U/L (ref 38–126)
Anion gap: 9 (ref 5–15)
BUN: 17 mg/dL (ref 8–23)
CALCIUM: 9.2 mg/dL (ref 8.9–10.3)
CO2: 25 mmol/L (ref 22–32)
Chloride: 107 mmol/L (ref 98–111)
Creatinine: 1.17 mg/dL — ABNORMAL HIGH (ref 0.44–1.00)
GFR, Est AFR Am: 54 mL/min — ABNORMAL LOW (ref >60)
GFR, Estimated: 46 mL/min — ABNORMAL LOW (ref >60)
Glucose, Bld: 107 mg/dL — ABNORMAL HIGH (ref 70–99)
Potassium: 3.7 mmol/L (ref 3.5–5.1)
Sodium: 141 mmol/L (ref 135–145)
Total Bilirubin: 0.6 mg/dL (ref 0.3–1.2)
Total Protein: 7 g/dL (ref 6.5–8.1)

## 2018-07-27 LAB — URINALYSIS, COMPLETE (UACMP) WITH MICROSCOPIC
Bacteria, UA: NONE SEEN
Bilirubin Urine: NEGATIVE
Glucose, UA: NEGATIVE mg/dL
Ketones, ur: NEGATIVE mg/dL
Nitrite: NEGATIVE
PROTEIN: NEGATIVE mg/dL
Specific Gravity, Urine: 1.011 (ref 1.005–1.030)
pH: 6 (ref 5.0–8.0)

## 2018-07-27 LAB — CBC WITH DIFFERENTIAL (CANCER CENTER ONLY)
Abs Immature Granulocytes: 0.01 10*3/uL (ref 0.00–0.07)
Basophils Absolute: 0 10*3/uL (ref 0.0–0.1)
Basophils Relative: 0 %
Eosinophils Absolute: 0.1 10*3/uL (ref 0.0–0.5)
Eosinophils Relative: 2 %
HCT: 35.9 % — ABNORMAL LOW (ref 36.0–46.0)
Hemoglobin: 11.8 g/dL — ABNORMAL LOW (ref 12.0–15.0)
Immature Granulocytes: 0 %
Lymphocytes Relative: 28 %
Lymphs Abs: 1.4 10*3/uL (ref 0.7–4.0)
MCH: 30.2 pg (ref 26.0–34.0)
MCHC: 32.9 g/dL (ref 30.0–36.0)
MCV: 91.8 fL (ref 80.0–100.0)
MONO ABS: 0.5 10*3/uL (ref 0.1–1.0)
Monocytes Relative: 9 %
Neutro Abs: 3 10*3/uL (ref 1.7–7.7)
Neutrophils Relative %: 61 %
Platelet Count: 170 10*3/uL (ref 150–400)
RBC: 3.91 MIL/uL (ref 3.87–5.11)
RDW: 13 % (ref 11.5–15.5)
WBC Count: 5 10*3/uL (ref 4.0–10.5)
nRBC: 0 % (ref 0.0–0.2)

## 2018-07-27 MED ORDER — ACETAMINOPHEN 325 MG PO TABS
ORAL_TABLET | ORAL | Status: AC
  Filled 2018-07-27: qty 2

## 2018-07-27 MED ORDER — TRASTUZUMAB-ANNS CHEMO 420 MG IV SOLR
300.0000 mg | Freq: Once | INTRAVENOUS | Status: AC
  Administered 2018-07-27: 300 mg via INTRAVENOUS
  Filled 2018-07-27: qty 14.29

## 2018-07-27 MED ORDER — SODIUM CHLORIDE 0.9% FLUSH
10.0000 mL | INTRAVENOUS | Status: DC | PRN
  Administered 2018-07-27: 10 mL
  Filled 2018-07-27: qty 10

## 2018-07-27 MED ORDER — SODIUM CHLORIDE 0.9 % IV SOLN
Freq: Once | INTRAVENOUS | Status: AC
  Administered 2018-07-27: 11:00:00 via INTRAVENOUS
  Filled 2018-07-27: qty 250

## 2018-07-27 MED ORDER — SODIUM CHLORIDE 0.9 % IV SOLN
20.0000 mg | Freq: Once | INTRAVENOUS | Status: AC
  Administered 2018-07-27: 20 mg via INTRAVENOUS
  Filled 2018-07-27: qty 2

## 2018-07-27 MED ORDER — FAMOTIDINE IN NACL 20-0.9 MG/50ML-% IV SOLN
INTRAVENOUS | Status: AC
  Filled 2018-07-27: qty 50

## 2018-07-27 MED ORDER — DIPHENHYDRAMINE HCL 50 MG/ML IJ SOLN
25.0000 mg | Freq: Once | INTRAMUSCULAR | Status: AC
  Administered 2018-07-27: 25 mg via INTRAVENOUS

## 2018-07-27 MED ORDER — DIPHENHYDRAMINE HCL 50 MG/ML IJ SOLN
INTRAMUSCULAR | Status: AC
  Filled 2018-07-27: qty 1

## 2018-07-27 MED ORDER — PHENAZOPYRIDINE HCL 100 MG PO TABS: 100.0000 mg | ORAL_TABLET | Freq: Three times a day (TID) | ORAL | 0 refills | Status: DC | PRN

## 2018-07-27 MED ORDER — ACETAMINOPHEN 325 MG PO TABS
650.0000 mg | ORAL_TABLET | Freq: Once | ORAL | Status: AC
  Administered 2018-07-27: 650 mg via ORAL

## 2018-07-27 MED ORDER — TRASTUZUMAB CHEMO 150 MG IV SOLR: 4.0000 mg/kg | Freq: Once | INTRAVENOUS | Status: DC

## 2018-07-27 MED ORDER — HEPARIN SOD (PORK) LOCK FLUSH 100 UNIT/ML IV SOLN
500.0000 [IU] | Freq: Once | INTRAVENOUS | Status: AC | PRN
  Administered 2018-07-27: 500 [IU]
  Filled 2018-07-27: qty 5

## 2018-07-27 MED ORDER — FAMOTIDINE IN NACL 20-0.9 MG/50ML-% IV SOLN
20.0000 mg | Freq: Once | INTRAVENOUS | Status: AC
  Administered 2018-07-27: 20 mg via INTRAVENOUS

## 2018-07-27 MED ORDER — SODIUM CHLORIDE 0.9 % IV SOLN
80.0000 mg/m2 | Freq: Once | INTRAVENOUS | Status: AC
  Administered 2018-07-27: 144 mg via INTRAVENOUS
  Filled 2018-07-27: qty 24

## 2018-07-27 MED ORDER — TRASTUZUMAB-ANNS CHEMO 420 MG IV SOLR
4.0000 mg/kg | Freq: Once | INTRAVENOUS | Status: DC
  Filled 2018-07-27: qty 15

## 2018-07-27 MED ORDER — SULFAMETHOXAZOLE-TRIMETHOPRIM 800-160 MG PO TABS: 1.0000 | ORAL_TABLET | Freq: Two times a day (BID) | ORAL | 0 refills | Status: DC

## 2018-07-27 NOTE — Patient Instructions (Signed)
[facility] Discharge Instructions for Patients Receiving Chemotherapy  Today you received the following chemotherapy agents Kanjinti; taxol  To help prevent nausea and vomiting after your treatment, we encourage you to take your nausea medication as prescribed.  If you develop nausea and vomiting that is not controlled by your nausea medication, call the clinic.   BELOW ARE SYMPTOMS THAT SHOULD BE REPORTED IMMEDIATELY:  *FEVER GREATER THAN 100.5 F  *CHILLS WITH OR WITHOUT FEVER  NAUSEA AND VOMITING THAT IS NOT CONTROLLED WITH YOUR NAUSEA MEDICATION  *UNUSUAL SHORTNESS OF BREATH  *UNUSUAL BRUISING OR BLEEDING  TENDERNESS IN MOUTH AND THROAT WITH OR WITHOUT PRESENCE OF ULCERS  *URINARY PROBLEMS  *BOWEL PROBLEMS  UNUSUAL RASH Items with * indicate a potential emergency and should be followed up as soon as possible.  Feel free to call the clinic should you have any questions or concerns. The clinic phone number is [phone].  Please show the [facility] at check-in to the Emergency Department and triage nurse.  Paclitaxel injection What is this medicine? PACLITAXEL (PAK li TAX el) is a chemotherapy drug. It targets fast dividing cells, like cancer cells, and causes these cells to die. This medicine is used to treat ovarian cancer, breast cancer, lung cancer, Kaposi's sarcoma, and other cancers. This medicine may be used for other purposes; ask your health care provider or pharmacist if you have questions. COMMON BRAND NAME(S): Onxol, Taxol What should I tell my health care provider before I take this medicine? They need to know if you have any of these conditions: -history of irregular heartbeat -liver disease -low blood counts, like low white cell, platelet, or red cell counts -lung or breathing disease, like asthma -tingling of the fingers or toes, or other nerve disorder -an unusual or allergic reaction to paclitaxel, alcohol, polyoxyethylated  castor oil, other chemotherapy, other medicines, foods, dyes, or preservatives -pregnant or trying to get pregnant -breast-feeding How should I use this medicine? This drug is given as an infusion into a vein. It is administered in a hospital or clinic by a specially trained health care professional. Talk to your pediatrician regarding the use of this medicine in children. Special care may be needed. Overdosage: If you think you have taken too much of this medicine contact a poison control center or emergency room at once. NOTE: This medicine is only for you. Do not share this medicine with others. What if I miss a dose? It is important not to miss your dose. Call your doctor or health care professional if you are unable to keep an appointment. What may interact with this medicine? Do not take this medicine with any of the following medications: -disulfiram -metronidazole This medicine may also interact with the following medications: -antiviral medicines for hepatitis, HIV or AIDS -certain antibiotics like erythromycin and clarithromycin -certain medicines for fungal infections like ketoconazole and itraconazole -certain medicines for seizures like carbamazepine, phenobarbital, phenytoin -gemfibrozil -nefazodone -rifampin -St. John's wort This list may not describe all possible interactions. Give your health care provider a list of all the medicines, herbs, non-prescription drugs, or dietary supplements you use. Also tell them if you smoke, drink alcohol, or use illegal drugs. Some items may interact with your medicine. What should I watch for while using this medicine? Your condition will be monitored carefully while you are receiving this medicine. You will need important blood work done while you are taking this medicine. This medicine can cause serious allergic reactions. To reduce your risk  you will need to take other medicine(s) before treatment with this medicine. If you experience  allergic reactions like skin rash, itching or hives, swelling of the face, lips, or tongue, tell your doctor or health care professional right away. In some cases, you may be given additional medicines to help with side effects. Follow all directions for their use. This drug may make you feel generally unwell. This is not uncommon, as chemotherapy can affect healthy cells as well as cancer cells. Report any side effects. Continue your course of treatment even though you feel ill unless your doctor tells you to stop. Call your doctor or health care professional for advice if you get a fever, chills or sore throat, or other symptoms of a cold or flu. Do not treat yourself. This drug decreases your body's ability to fight infections. Try to avoid being around people who are sick. This medicine may increase your risk to bruise or bleed. Call your doctor or health care professional if you notice any unusual bleeding. Be careful brushing and flossing your teeth or using a toothpick because you may get an infection or bleed more easily. If you have any dental work done, tell your dentist you are receiving this medicine. Avoid taking products that contain aspirin, acetaminophen, ibuprofen, naproxen, or ketoprofen unless instructed by your doctor. These medicines may hide a fever. Do not become pregnant while taking this medicine. Women should inform their doctor if they wish to become pregnant or think they might be pregnant. There is a potential for serious side effects to an unborn child. Talk to your health care professional or pharmacist for more information. Do not breast-feed an infant while taking this medicine. Men are advised not to father a child while receiving this medicine. This product may contain alcohol. Ask your pharmacist or healthcare provider if this medicine contains alcohol. Be sure to tell all healthcare providers you are taking this medicine. Certain medicines, like metronidazole and  disulfiram, can cause an unpleasant reaction when taken with alcohol. The reaction includes flushing, headache, nausea, vomiting, sweating, and increased thirst. The reaction can last from 30 minutes to several hours. What side effects may I notice from receiving this medicine? Side effects that you should report to your doctor or health care professional as soon as possible: -allergic reactions like skin rash, itching or hives, swelling of the face, lips, or tongue -breathing problems -changes in vision -fast, irregular heartbeat -high or low blood pressure -mouth sores -pain, tingling, numbness in the hands or feet -signs of decreased platelets or bleeding - bruising, pinpoint red spots on the skin, black, tarry stools, blood in the urine -signs of decreased red blood cells - unusually weak or tired, feeling faint or lightheaded, falls -signs of infection - fever or chills, cough, sore throat, pain or difficulty passing urine -signs and symptoms of liver injury like dark yellow or brown urine; general ill feeling or flu-like symptoms; light-colored stools; loss of appetite; nausea; right upper belly pain; unusually weak or tired; yellowing of the eyes or skin -swelling of the ankles, feet, hands -unusually slow heartbeat Side effects that usually do not require medical attention (report to your doctor or health care professional if they continue or are bothersome): -diarrhea -hair loss -loss of appetite -muscle or joint pain -nausea, vomiting -pain, redness, or irritation at site where injected -tiredness This list may not describe all possible side effects. Call your doctor for medical advice about side effects. You may report side effects to FDA  at 1-800-FDA-1088. Where should I keep my medicine? This drug is given in a hospital or clinic and will not be stored at home. NOTE: This sheet is a summary. It may not cover all possible information. If you have questions about this medicine,  talk to your doctor, pharmacist, or health care provider.  2019 Elsevier/Gold Standard (2017-03-10 13:14:55)  Trastuzumab injection for infusion What is this medicine? TRASTUZUMAB (tras TOO zoo mab) is a monoclonal antibody. It is used to treat breast cancer and stomach cancer. This medicine may be used for other purposes; ask your health care provider or pharmacist if you have questions. COMMON BRAND NAME(S): Herceptin, Calla Kicks, OGIVRI What should I tell my health care provider before I take this medicine? They need to know if you have any of these conditions: -heart disease -heart failure -lung or breathing disease, like asthma -an unusual or allergic reaction to trastuzumab, benzyl alcohol, or other medications, foods, dyes, or preservatives -pregnant or trying to get pregnant -breast-feeding How should I use this medicine? This drug is given as an infusion into a vein. It is administered in a hospital or clinic by a specially trained health care professional. Talk to your pediatrician regarding the use of this medicine in children. This medicine is not approved for use in children. Overdosage: If you think you have taken too much of this medicine contact a poison control center or emergency room at once. NOTE: This medicine is only for you. Do not share this medicine with others. What if I miss a dose? It is important not to miss a dose. Call your doctor or health care professional if you are unable to keep an appointment. What may interact with this medicine? This medicine may interact with the following medications: -certain types of chemotherapy, such as daunorubicin, doxorubicin, epirubicin, and idarubicin This list may not describe all possible interactions. Give your health care provider a list of all the medicines, herbs, non-prescription drugs, or dietary supplements you use. Also tell them if you smoke, drink alcohol, or use illegal drugs. Some items may interact with your  medicine. What should I watch for while using this medicine? Visit your doctor for checks on your progress. Report any side effects. Continue your course of treatment even though you feel ill unless your doctor tells you to stop. Call your doctor or health care professional for advice if you get a fever, chills or sore throat, or other symptoms of a cold or flu. Do not treat yourself. Try to avoid being around people who are sick. You may experience fever, chills and shaking during your first infusion. These effects are usually mild and can be treated with other medicines. Report any side effects during the infusion to your health care professional. Fever and chills usually do not happen with later infusions. Do not become pregnant while taking this medicine or for 7 months after stopping it. Women should inform their doctor if they wish to become pregnant or think they might be pregnant. Women of child-bearing potential will need to have a negative pregnancy test before starting this medicine. There is a potential for serious side effects to an unborn child. Talk to your health care professional or pharmacist for more information. Do not breast-feed an infant while taking this medicine or for 7 months after stopping it. Women must use effective birth control with this medicine. What side effects may I notice from receiving this medicine? Side effects that you should report to your doctor or health care professional  as soon as possible: -allergic reactions like skin rash, itching or hives, swelling of the face, lips, or tongue -chest pain or palpitations -cough -dizziness -feeling faint or lightheaded, falls -fever -general ill feeling or flu-like symptoms -signs of worsening heart failure like breathing problems; swelling in your legs and feet -unusually weak or tired Side effects that usually do not require medical attention (report to your doctor or health care professional if they continue or  are bothersome): -bone pain -changes in taste -diarrhea -joint pain -nausea/vomiting -weight loss This list may not describe all possible side effects. Call your doctor for medical advice about side effects. You may report side effects to FDA at 1-800-FDA-1088. Where should I keep my medicine? This drug is given in a hospital or clinic and will not be stored at home. NOTE: This sheet is a summary. It may not cover all possible information. If you have questions about this medicine, talk to your doctor, pharmacist, or health care provider.  2019 Elsevier/Gold Standard (2016-07-01 14:37:52)

## 2018-07-29 ENCOUNTER — Ambulatory Visit: Payer: Medicare Other | Admitting: Physical Therapy

## 2018-07-29 ENCOUNTER — Encounter: Payer: Self-pay | Admitting: Rehabilitation

## 2018-07-29 ENCOUNTER — Encounter: Payer: Self-pay | Admitting: Physical Therapy

## 2018-07-29 ENCOUNTER — Telehealth: Payer: Self-pay

## 2018-07-29 DIAGNOSIS — C50411 Malignant neoplasm of upper-outer quadrant of right female breast: Secondary | ICD-10-CM

## 2018-07-29 DIAGNOSIS — Z171 Estrogen receptor negative status [ER-]: Principal | ICD-10-CM

## 2018-07-29 DIAGNOSIS — Z483 Aftercare following surgery for neoplasm: Secondary | ICD-10-CM

## 2018-07-29 DIAGNOSIS — R293 Abnormal posture: Secondary | ICD-10-CM

## 2018-07-29 DIAGNOSIS — R600 Localized edema: Secondary | ICD-10-CM

## 2018-07-29 NOTE — Patient Instructions (Addendum)
Manual Lymph Drainage for Right Breast.  Do daily.  Do slowly. Use flat hands with just enough pressure to stretch the skin. Do not slide over the skin, but move the skin with the hand you're using. Lie down or sit comfortably (in a recliner, for example) to do this.  1) Take slow deep breaths, allowing your belly to balloon out as your breathe in, 5x (to "wake up" abdominal lymph nodes to take on extra fluid). 2) Left armpit-stretch skin in small circles to stimulate intact lymph nodes there, 5-7x. 3) Right groin area, at panty line-stretch skin in small circles to stimulate lymph nodes 5-7x. 4) Redirect fluid from right chest toward left armpit (stretch skin starting at right chest in 3-4 spots working toward left armpit) 3-4x across the chest. AVOID AREA AROUND PORT  5) Redirect fluid from right armpit toward right groin (cup your hand around the curve of your right side and do 3-4 "pumps" from armpit to groin) 3-4x down your side. 6) Draw an imaginary diagonal line from upper outer breast through the nipple area toward lower inner breast.  Direct fluid upward and inward from this line toward the pathway across your upper chest (established in #5).  Do this in three rows to treat all of the upper inner breast tissue, and do each row 3-4x. 7) Then repeat #5 above. 8) From the imaginary diagonal, direct fluid in three rows (to treat all of lower outer breast tissue) downward and outward toward pathway established in #6 that is aimed at the right groin. 9)  Then repeat #6 above. 10)  End with repeating #3 and #4 above.       First of all, check with your insurance company to see if provider is in [place] (for wigs and compression sleeves / gloves/gauntlets )  [place], [place] [address] [phone]  Will file some insurances --- call for appointment   Second to [facility] (for mastectomy prosthetics and garments) [place], [place]  [address] [phone] Will file some insurances --- call for appointment  [facility]  [address]  [place], [place] [address] [phone] Lower extremity garments  [facility] [place] [employer], [place]  [address] [place] ( Medicaid certified lymphedema fitter) [phone] [email or web address]  [place]  [address] [address], [place] [address] [phone]  Other Resources: National Lymphedema Network:  www.lymphnet.org www.Klosetraining.com for patient articles and self manual lymph drainage information www.lymphedemablog.com has informative articles.  [email or web address].com www.[email or web address] [email or web address] [facility] [phone] [address],    [address] [phone]

## 2018-07-29 NOTE — Therapy (Signed)
[facility] [address] [place], [place], [address] Phone: [phone]   Fax:  [phone]  Physical Therapy Treatment  Patient Details  Name: Rachel Kelly MRN: [id number] Date of Birth: 10/18/1945 Referring Provider (PT): Dr. [name] [name]   Encounter Date: 07/29/2018  PT End of Session - 07/29/18 1624    Visit Number  3    Number of Visits  10    Date for PT Re-Evaluation  08/23/18    PT Start Time  1440    PT Stop Time  1520    PT Time Calculation (min)  40 min    Activity Tolerance  Patient tolerated treatment well    Behavior During Therapy  WFL for tasks assessed/performed       Past Medical History:  Diagnosis Date  . Anemia   . Blood transfusion 2005  . Hernia   . HH (hiatus hernia)   . Hyperlipidemia   . Hypertension   . PAH (pulmonary artery hypertension) (HCC)   . Sarcoidosis   . Shortness of breath    on exertion  . Sleep apnea    does not use cpap  . Wheezing     Past Surgical History:  Procedure Laterality Date  . ABDOMINAL HYSTERECTOMY  1988  . BACK SURGERY  2005   lower  . BREAST LUMPECTOMY WITH RADIOACTIVE SEED AND SENTINEL LYMPH NODE BIOPSY Right 07/02/2018   Procedure: RIGHT BREAST RADIOACTIVE SEED LUMPECTOMY X2 AND RIGHT AXILLARY  SENTINEL LYMPH NODE BIOPSY;  Surgeon: [name], [name], MD;  Location: [facility] OR;  Service: General;  Laterality: Right;  . ESOPHAGOGASTRODUODENOSCOPY  06/24/2012   Procedure: ESOPHAGOGASTRODUODENOSCOPY (EGD);  Surgeon: David H Newman, MD;  Location: [facility];  Service: General;  Laterality: N/A;  . HIATAL HERNIA REPAIR  08/18/2012   Procedure: LAPAROSCOPIC REPAIR OF HIATAL HERNIA;  Surgeon: [name] T [name], MD;  Location: [facility];  Service: General;  Laterality: N/A;  Laparoscopic Hiatal Hernia Repair   . INSERTION OF MESH  08/18/2012   Procedure: INSERTION OF MESH;  Surgeon: [name] T [name], MD;  Location: [facility];  Service: General;;  . IR IMAGING  GUIDED PORT INSERTION  07/19/2018  . PORTACATH PLACEMENT N/A 07/02/2018   Procedure: INSERTION PORT-A-CATH TRIED NO PORT IN PLACE;  Surgeon: [name], [name], MD;  Location: [facility] OR;  Service: General;  Laterality: N/A;  . VESICOVAGINAL FISTULA CLOSURE W/ TAH      There were no vitals filed for this visit.  Subjective Assessment - 07/29/18 1447    Subjective  Pt is here to learn manual lymph drainage to her breast and to the firm areas around her incision     Pertinent History  Patient was diagnosed on 05/26/18 with right ER/PR negative and HER2 positive invasive ductal carcinoma breast cancer. Patient underwent a right lumpectomy and sentinel node biopsy (0/2 nodes positive) on 07/02/18. Ki67 is 10%.    Patient Stated Goals  See if I'm doing ok with my arm    Currently in Pain?  No/denies                       [facility] Adult PT Treatment/Exercise - 07/29/18 0001      Self-Care   Self-Care  Other Self-Care Comments    Other Self-Care Comments   issued patch of peach lined foam to wear at area of fullness around incision                     PT Long Term Goals - 07/26/18 1419      PT LONG TERM GOAL #1   Title  Patient will demonstrate she has returned to baseline realted to shoulder function and ROM post operatively.    Time  8    Period  Weeks    Status  Achieved      PT LONG TERM GOAL #2   Title  Patient will report >/= 25% reduction in the sensitivity around her breast and axillary incisions.    Time  4    Period  Weeks    Status  New      PT LONG TERM GOAL #3   Title  Patient will report >/= 40% reduction in right breast edema and fullness to fit more comfortably in her bras.    Time  4    Period  Weeks    Status  New      PT LONG TERM GOAL #4   Title  Patient will demonstrate self manual lymph drainage for management of her edema.    Time  4    Period  Weeks    Status  New            Plan - 07/29/18 1625    Clinical Impression Statement   Pt continues with lymphedema in right breast with fullness incisions.  Began instruction in self MLD . She has port in right chest so avoided interaxillay anastamosis at this time.  She is wearing an underwire bra today. Gave her information about getting a comrpession bra and she will ask Dr, [name] to sign the prescription when she goes to see him next week. Added lined peach foam to wear inside bra around incision and pt knows to remove it if at all uncomfortable.     Rehab Potential  Excellent    Clinical Impairments Affecting Rehab Potential  None currently - will begin chemo 07/27/2018 so that may effect her ability to attend PT appointments    PT Next Visit Plan  Breast manual lymph drainage and scar mobilization  Check effect of lined foam and if pt will get compression bra     Consulted and Agree with Plan of Care  Patient       Patient will benefit from skilled therapeutic intervention in order to improve the following deficits and impairments:  Postural dysfunction, Decreased range of motion, Decreased knowledge of precautions, Impaired UE functional use, Pain  Visit Diagnosis: Malignant neoplasm of upper-outer quadrant of right breast in female, estrogen receptor negative (HCC)  Abnormal posture  Aftercare following surgery for neoplasm  Localized edema     Problem List Patient Active Problem List   Diagnosis Date Noted  . Pre-op evaluation 06/24/2018  . Malignant neoplasm of upper-outer quadrant of right breast in female, estrogen receptor negative (HCC) 06/07/2018  . Nausea & vomiting 11/21/2012  . Bilateral renal masses 11/21/2012  . Back pain, thoracic 08/14/2012  . Obstructive sleep apnea 05/09/2012  . GERD (gastroesophageal reflux disease) 06/28/2010  . HYPERLIPIDEMIA 12/09/2007  . HYPERTENSION 12/09/2007  . Sarcoidosis 11/26/2007  . DYSPNEA 11/26/2007    K. , PT  ,  [place] 07/29/2018, 4:30 PM  [facility] [address] [place], [place], [address] Phone: [phone]   Fax:  [phone]  Name: Rachel Kelly MRN: [id number] Date of Birth: 01/27/1946   

## 2018-07-29 NOTE — Telephone Encounter (Signed)
Called to return vm. Called pt lvm to call back and confirm next upcoming appt.

## 2018-07-29 NOTE — Progress Notes (Signed)
Encounter created in error

## 2018-07-30 ENCOUNTER — Other Ambulatory Visit: Payer: Self-pay | Admitting: *Deleted

## 2018-07-30 DIAGNOSIS — Z171 Estrogen receptor negative status [ER-]: Principal | ICD-10-CM

## 2018-07-30 DIAGNOSIS — C50411 Malignant neoplasm of upper-outer quadrant of right female breast: Secondary | ICD-10-CM

## 2018-07-30 NOTE — Progress Notes (Signed)
[facility] Progress Note   Rachel Kelly [id number] 1945/08/12 73 y.o.  Rachel Kelly is managed by [name]  Actively treated with chemotherapy/immunotherapy/hormonal therapy: yes  Current Therapy: Paclitaxel and Kanjinti  Last Treated: 07/27/2018 (cycle 1, day 1)  Assessment: Plan:    Dysuria  Malignant neoplasm of upper-outer quadrant of right breast in female, estrogen receptor negative ([place])   Dysuria: Patient was given prescription for Bactrim DS 1 p.o. twice daily x7 days and Pyridium to use as needed.  A urinalysis was completed today.  ER negative malignant neoplasm of the right breast: The patient continues to be managed by [name] and presents to the clinic today for cycle 1, day 1 of paclitaxel and Kanjinti.  She will return on 08/02/2022 a port flush, labs, consideration of treatment, and will see [name].  Please see After Visit Summary for patient specific instructions.  Future Appointments  Date Time Provider [place]  08/02/2018 11:15 AM [facility] ONLY [facility] None  08/02/2018 11:30 AM [facility] FLUSH [facility] None  08/02/2018 12:15 PM [name], [name], MD [facility] None  08/02/2018  1:15 PM [facility] INFUSION [facility] None  08/03/2018  2:30 PM Kipp Laurence, PT [facility] None  08/05/2018  2:30 PM Kipp Laurence, PT [facility] None  08/09/2018 11:45 AM [facility] LAB 3 [facility] None  08/09/2018 12:00 PM [facility] [place] FLUSH [facility] None  08/09/2018  1:00 PM [facility] INFUSION [facility] None  08/16/2018 11:45 AM [facility] LAB 3 [facility] None  08/16/2018 12:00 PM [facility] [place] FLUSH [facility] None  08/16/2018  1:00 PM [facility] INFUSION [facility] None    No orders of the defined types were placed in this encounter.      Subjective:   [name] is a 73 y.o. (DOB [birth year]) female.  Chief Complaint: No chief complaint on file.   [name] is a 73 year old  female with a history of a ER negative malignant neoplasm of the right breast who is managed by [name].  She presents to the clinic today for consideration of cycle 1, day 1 of paclitaxel and Kanjinti.  She reports that she has been having urinary frequency and dysuria.  She denies that her urine is cloudy but does state that it is strong smelling.  She is having no fevers, chills, sweats, nausea, vomiting, diarrhea, or constipation.   Medications: I have reviewed the patient's current medications.  Allergies:  Allergies  Allergen Reactions  . Penicillins Rash    Fine red bumps that spread all over the body. Has patient had a PCN reaction causing immediate rash, facial/tongue/throat swelling, SOB or lightheadedness with hypotension: No Has patient had a PCN reaction causing severe rash involving mucus membranes or skin necrosis: No Has patient had a PCN reaction that required hospitalization: No Has patient had a PCN reaction occurring within the last 10 years: No If all of the above answers are "NO", then may proceed with Cephalosporin use.   . Skin Adhesives [Cyanoacrylate] Itching and Rash    Dermabond    Past Medical History:  Diagnosis Date  . Anemia   . Blood transfusion 2005  . Hernia   . HH (hiatus hernia)   . Hyperlipidemia   . Hypertension   . PAH (pulmonary artery hypertension) ([place])   . Sarcoidosis   . Shortness of breath    on exertion  . Sleep apnea    does not use cpap  . Wheezing  Past Surgical History:  Procedure Laterality Date  . ABDOMINAL HYSTERECTOMY  1988  . BACK SURGERY  2005   lower  . BREAST LUMPECTOMY WITH RADIOACTIVE SEED AND SENTINEL LYMPH NODE BIOPSY Right 07/02/2018   Procedure: RIGHT BREAST RADIOACTIVE SEED LUMPECTOMY X2 AND RIGHT AXILLARY  SENTINEL LYMPH NODE BIOPSY;  Surgeon: [name], MD;  Location: [facility];  Service: General;  Laterality: Right;  . ESOPHAGOGASTRODUODENOSCOPY  06/24/2012   Procedure:  ESOPHAGOGASTRODUODENOSCOPY (EGD);  Surgeon: [name], MD;  Location: [name];  Service: General;  Laterality: N/A;  . HIATAL HERNIA REPAIR  08/18/2012   Procedure: LAPAROSCOPIC REPAIR OF HIATAL HERNIA;  Surgeon: [name], MD;  Location: [facility];  Service: General;  Laterality: N/A;  Laparoscopic Hiatal Hernia Repair   . INSERTION OF MESH  08/18/2012   Procedure: INSERTION OF MESH;  Surgeon: [name], MD;  Location: [facility];  Service: General;;  . IR IMAGING GUIDED PORT INSERTION  07/19/2018  . PORTACATH PLACEMENT N/A 07/02/2018   Procedure: INSERTION PORT-A-CATH TRIED NO PORT IN PLACE;  Surgeon: [name], MD;  Location: [facility];  Service: General;  Laterality: N/A;  . VESICOVAGINAL FISTULA CLOSURE W/ TAH      Family History  Problem Relation Age of Onset  . Clotting disorder Mother        PE  . Heart disease Father   . Cancer Maternal Grandmother        not sure type, but spread to the brain.  . Coronary artery disease Other   . Cancer Other        ? type    Social History   Socioeconomic History  . Marital status: Single    Spouse name: Not on file  . Number of children: Not on file  . Years of education: Not on file  . Highest education level: Not on file  Occupational History  . Occupation: Retired Tour manager  . Financial resource strain: Not on file  . Food insecurity:    Worry: Not on file    Inability: Not on file  . Transportation needs:    Medical: Not on file    Non-medical: Not on file  Tobacco Use  . Smoking status: Never Smoker  . Smokeless tobacco: Never Used  Substance and Sexual Activity  . Alcohol use: No  . Drug use: No  . Sexual activity: Not on file  Lifestyle  . Physical activity:    Days per week: Not on file    Minutes per session: Not on file  . Stress: Not on file  Relationships  . Social connections:    Talks on phone: Not on file    Gets together: Not on file    Attends religious  service: Not on file    Active member of club or organization: Not on file    Attends meetings of clubs or organizations: Not on file    Relationship status: Not on file  . Intimate partner violence:    Fear of current or ex partner: Not on file    Emotionally abused: Not on file    Physically abused: Not on file    Forced sexual activity: Not on file  Other Topics Concern  . Not on file  Social History Narrative  . Not on file    Past Medical History, Surgical history, Social history, and Family history were reviewed and updated as appropriate.   Please see review of systems for further details on  the patient's review from today.   Review of Systems:  Review of Systems  Constitutional: Negative for chills, diaphoresis and fever.  Respiratory: Negative for cough and shortness of breath.   Cardiovascular: Negative for chest pain, palpitations and leg swelling.  Gastrointestinal: Negative for abdominal pain, constipation, diarrhea, nausea and vomiting.  Genitourinary: Positive for dysuria and frequency. Negative for difficulty urinating, flank pain, hematuria and urgency.  Skin: Negative for rash.  Neurological: Negative for dizziness and headaches.    Objective:   Physical Exam:  There were no vitals taken for this visit. ECOG: 0  Physical Exam Constitutional:      General: She is not in acute distress.    Appearance: Normal appearance. She is not ill-appearing or diaphoretic.  HENT:     Head: Normocephalic and atraumatic.  Cardiovascular:     Rate and Rhythm: Normal rate and regular rhythm.     Heart sounds: Normal heart sounds. No murmur. No friction rub. No gallop.   Pulmonary:     Effort: Pulmonary effort is normal. No respiratory distress.     Breath sounds: Normal breath sounds. No wheezing or rales.  Skin:    General: Skin is warm and dry.     Lab Review:     Component Value Date/Time   NA 141 07/27/2018 0950   K 3.7 07/27/2018 0950   CL 107 07/27/2018  0950   CO2 25 07/27/2018 0950   GLUCOSE 107 (H) 07/27/2018 0950   BUN 17 07/27/2018 0950   CREATININE 1.17 (H) 07/27/2018 0950   CALCIUM 9.2 07/27/2018 0950   PROT 7.0 07/27/2018 0950   ALBUMIN 3.6 07/27/2018 0950   AST 21 07/27/2018 0950   ALT 13 07/27/2018 0950   ALKPHOS 79 07/27/2018 0950   BILITOT 0.6 07/27/2018 0950   GFRNONAA 46 (L) 07/27/2018 0950   GFRAA 54 (L) 07/27/2018 0950       Component Value Date/Time   WBC 5.0 07/27/2018 0950   WBC 5.8 07/19/2018 0710   RBC 3.91 07/27/2018 0950   HGB 11.8 (L) 07/27/2018 0950   HCT 35.9 (L) 07/27/2018 0950   PLT 170 07/27/2018 0950   MCV 91.8 07/27/2018 0950   MCH 30.2 07/27/2018 0950   MCHC 32.9 07/27/2018 0950   RDW 13.0 07/27/2018 0950   LYMPHSABS 1.4 07/27/2018 0950   MONOABS 0.5 07/27/2018 0950   EOSABS 0.1 07/27/2018 0950   BASOSABS 0.0 07/27/2018 0950   -------------------------------  Imaging from last 24 hours (if applicable):  Radiology interpretation: Nm Sentinel Node Inj-no Rpt (breast)  Result Date: 07/02/2018 Sulfur colloid was injected by the nuclear medicine technologist for melanoma sentinel node.   Mm Breast Surgical Specimen  Result Date: 07/02/2018 CLINICAL DATA:  Status post surgical excision of a second right breast lesion following radioactive seed localization. EXAM: SPECIMEN RADIOGRAPH OF THE RIGHT BREAST COMPARISON:  Previous exam(s). FINDINGS: Status post excision of the right breast. The radioactive seed and biopsy marker clip are present, completely intact, and were marked for pathology. IMPRESSION: Specimen radiograph of the right breast. Electronically Signed   By: Lajean Manes M.D.   On: 07/02/2018 10:06   Mm Breast Surgical Specimen  Result Date: 07/02/2018 CLINICAL DATA:  Status post surgical excision of a right breast lesion following radioactive seed localization. EXAM: SPECIMEN RADIOGRAPH OF THE RIGHT BREAST COMPARISON:  Previous exam(s). FINDINGS: Status post excision of the  right breast. The radioactive seed and biopsy marker clip are present, completely intact, and were marked for pathology. IMPRESSION: Specimen  radiograph of the right breast. Electronically Signed   By: Lajean Manes M.D.   On: 07/02/2018 10:00   Dg Chest Port 1 View  Result Date: 07/02/2018 CLINICAL DATA:  Attempted Port-A-Cath placement. EXAM: PORTABLE CHEST 1 VIEW COMPARISON:  Radiographs of Nov 21, 2012. FINDINGS: Stable cardiomediastinal silhouette. Stable right paratracheal calcified adenopathy is noted. No pneumothorax is noted. Mild bibasilar subsegmental atelectasis is noted. Bony thorax is unremarkable. No significant pleural effusion is noted. IMPRESSION: Mild bibasilar subsegmental atelectasis. Electronically Signed   By: Marijo Conception, M.D.   On: 07/02/2018 11:47   Mm Rt Radioactive Seed Loc Mammo Guide  Result Date: 07/01/2018 CLINICAL DATA:  Patient presents for radioactive seed localization of an area of breast carcinoma, marked with a ribbon shaped biopsy clip, in the lateral right breast, as well as radioactive seed localization of an area of possible papilloma, marked with a coil shaped clip, in the more anterior, lateral right breast. EXAM: MAMMOGRAPHIC GUIDED RADIOACTIVE SEED LOCALIZATION OF THE RIGHT BREAST: 2 BIOPSY CLIPS IN THE RIGHT BREAST WERE LOCALIZED. COMPARISON:  Previous exam(s). FINDINGS: Patient presents for radioactive seed localization prior to surgical excision. I met with the patient and we discussed the procedure of seed localization including benefits and alternatives. We discussed the high likelihood of a successful procedure. We discussed the risks of the procedure including infection, bleeding, tissue injury and further surgery. We discussed the low dose of radioactivity involved in the procedure. Informed, written consent was given. The usual time-out protocol was performed immediately prior to the procedure. Ribbon shaped clip: Using mammographic guidance,  sterile technique, 1% lidocaine and an I-125 radioactive seed, the ribbon shaped biopsy clip was localized using a lateral approach. Order number of I-125 seed:  [id number]. Total activity:  6.759 millicuries reference Date: 06/11/2018 Coil shaped clip: Using mammographic guidance, sterile technique, 1% lidocaine and an I-125 radioactive seed, the coil shaped biopsy clip was localized using a lateral approach. Order number of I-125 seed:  [id number]. Total activity:  9.357 millicuries reference Date: 06/11/2018 The follow-up mammogram images confirm the seed in the expected location and were marked for Dr. [name]. Follow-up survey of the patient confirms presence of both radioactive seeds. The patient tolerated the procedure well and was released from the [facility]. She was given instructions regarding seed removal. IMPRESSION: Radioactive seed localization of 2 lesions in the right breast. No apparent complications. Electronically Signed   By: Lajean Manes M.D.   On: 07/01/2018 15:04   Mm Rt Radio Seed Ea Add Lesion Loc Mammo  Result Date: 07/01/2018 CLINICAL DATA:  Patient presents for radioactive seed localization of an area of breast carcinoma, marked with a ribbon shaped biopsy clip, in the lateral right breast, as well as radioactive seed localization of an area of possible papilloma, marked with a coil shaped clip, in the more anterior, lateral right breast. EXAM: MAMMOGRAPHIC GUIDED RADIOACTIVE SEED LOCALIZATION OF THE RIGHT BREAST: 2 BIOPSY CLIPS IN THE RIGHT BREAST WERE LOCALIZED. COMPARISON:  Previous exam(s). FINDINGS: Patient presents for radioactive seed localization prior to surgical excision. I met with the patient and we discussed the procedure of seed localization including benefits and alternatives. We discussed the high likelihood of a successful procedure. We discussed the risks of the procedure including infection, bleeding, tissue injury and further surgery. We discussed the low dose  of radioactivity involved in the procedure. Informed, written consent was given. The usual time-out protocol was performed immediately prior to the procedure. Ribbon shaped clip: Using mammographic  guidance, sterile technique, 1% lidocaine and an I-125 radioactive seed, the ribbon shaped biopsy clip was localized using a lateral approach. Order number of I-125 seed:  [id number]. Total activity:  6.063 millicuries reference Date: 06/11/2018 Coil shaped clip: Using mammographic guidance, sterile technique, 1% lidocaine and an I-125 radioactive seed, the coil shaped biopsy clip was localized using a lateral approach. Order number of I-125 seed:  [id number]. Total activity:  3.557 millicuries reference Date: 06/11/2018 The follow-up mammogram images confirm the seed in the expected location and were marked for Dr. [name]. Follow-up survey of the patient confirms presence of both radioactive seeds. The patient tolerated the procedure well and was released from the [facility]. She was given instructions regarding seed removal. IMPRESSION: Radioactive seed localization of 2 lesions in the right breast. No apparent complications. Electronically Signed   By: Lajean Manes M.D.   On: 07/01/2018 15:04   Ir Imaging Guided Port Insertion  Result Date: 07/19/2018 INDICATION: 73 year old female with right-sided breast cancer and bilateral renal cell cancer. She presents for portacatheter placement for chemotherapy. EXAM: IMPLANTED PORT A CATH PLACEMENT WITH ULTRASOUND AND FLUOROSCOPIC GUIDANCE MEDICATIONS: 1 g vancomycin; The antibiotic was administered within an appropriate time interval prior to skin puncture. ANESTHESIA/SEDATION: Versed 0.5 mg IV; Fentanyl 25 mcg IV; Moderate Sedation Time:  22 minutes The patient was continuously monitored during the procedure by the interventional radiology nurse under my direct supervision. FLUOROSCOPY TIME:  0 minutes, 6 seconds (1 mGy) COMPLICATIONS: None immediate. PROCEDURE: The  right neck and chest was prepped with chlorhexidine, and draped in the usual sterile fashion using maximum barrier technique (cap and mask, sterile gown, sterile gloves, large sterile sheet, hand hygiene and cutaneous antiseptic). Antibiotic prophylaxis was provided with 1g vancomycin administered IV one hour prior to skin incision. Local anesthesia was attained by infiltration with 1% lidocaine with epinephrine. Ultrasound demonstrated patency of the right internal jugular vein, and this was documented with an image. Under real-time ultrasound guidance, this vein was accessed with a 21 gauge micropuncture needle and image documentation was performed. A small dermatotomy was made at the access site with an 11 scalpel. A 0.018" wire was advanced into the SVC and the access needle exchanged for a 14F micropuncture vascular sheath. The 0.018" wire was then removed and a 0.035" wire advanced into the IVC. An appropriate location for the subcutaneous reservoir was selected below the clavicle and an incision was made through the skin and underlying soft tissues. The subcutaneous tissues were then dissected using a combination of blunt and sharp surgical technique and a pocket was formed. A single lumen power injectable portacatheter was then tunneled through the subcutaneous tissues from the pocket to the dermatotomy and the port reservoir placed within the subcutaneous pocket. The venous access site was then serially dilated and a peel away vascular sheath placed over the wire. The wire was removed and the port catheter advanced into position under fluoroscopic guidance. The catheter tip is positioned in the superior cavoatrial junction. This was documented with a spot image. The portacatheter was then tested and found to flush and aspirate well. The port was flushed with saline followed by 100 units/mL heparinized saline. The pocket was then closed in two layers using first subdermal inverted interrupted absorbable  sutures followed by a running subcuticular suture. The dermatotomy at the venous access site was also closed with a single inverted subdermal suture. IMPRESSION: Successful placement of a right IJ approach Power Port with ultrasound and fluoroscopic guidance. The catheter is  ready for use. Signed, Criselda Peaches, MD, [facility] Electronically Signed   By: Jacqulynn Cadet M.D.   On: 07/19/2018 10:36

## 2018-08-01 NOTE — Progress Notes (Signed)
[place]  [phone] [phone]     ID: RONNESHA MESTER DOB: [birth year]  MR#: [id number]  KDT#:[id number]  Patient Care Team: [name], MD as PCP - General (Family Medicine) Deneise Lever, MD as Referring Physician (Pulmonary Disease) [name], MD as Consulting Physician (General Surgery) , [name], MD as Consulting Physician (Oncology) Eppie Gibson, MD as Attending Physician (Radiation Oncology) Chelsea Aus, DDS as Consulting Physician (Dentistry) Harriett Sine, MD as Consulting Physician (Dermatology) Bensimhon, Shaune Pascal, MD as Consulting Physician (Cardiology) OTHER MD:  CHIEF COMPLAINT: Triple negative breast cancer  CURRENT TREATMENT: Adjuvant chemotherapy and anti-HER-2 immunotherapy  INTERVAL HISTORY: Malie returns today for follow-up and treatment of her triple negative breast cancer. She is accompanied by a friend   The patient continues on adjuvant chemotherapy consisting of weekly paclitaxel x12 with trastuzumab. Today is day 8 cycle 1 (Taxol dose #2; 8 days from initial trastuzumab dose). She states that she did very well with her treatment; she did not experience any pain, swelling, nausea, vomiting, diarrhea, rash, mouth sores, or bony aches. She has had some mild funny feelings in the plantar aspect of both feet,, but she states that she experienced that even before she received treatment, it is intermittent, and it is not more frequent or intense than prior..  Since her last visit here, she underwent an echocardiogram on 07/22/2018, showing an ejection fraction in the 55% - 60% range.     REVIEW OF SYSTEMS: Ferrell denies unusual headaches, visual changes, nausea, vomiting, or dizziness. There has been no unusual cough, phlegm production, or pleurisy. This been no change in bowel or bladder habits. The patient denies unexplained fatigue or unexplained weight loss, bleeding, rash, or fever. A detailed  review of systems was otherwise noncontributory.    HISTORY OF CURRENT ILLNESS: From the original intake note:  COREEN SHIPPEE had routine screening mammography on 05/26/2018 showing a possible abnormality in the right breast. She underwent bilateral diagnostic mammography with tomography and right breast ultrasonography at [facility] on 06/02/2018 showing: breast density category C, suspicious mass at the 10 o'clock position 8 cm from the nipple. It measures 10 x 7 x 6 mm. Just adjacent to this mass, there is an irregular hypoechoic mass with punctate echogenic foci. It measures 9 x 8 x 3 mm. Additional indeterminate mass at the 9 o'clock position 6 cm from the nipple. It measures 6 x 4 x 4 mm. No suspicious right axillary lymphadenopathy.  Accordingly on 06/02/2018 she proceeded to biopsy of two of the right breast areas in question. The pathology from this procedure showed [id number]): at the 10 o'clock position, invasive ductal carcinoma, grade 2, estrogen and progesterone receptor negative, HER-2 equivocal by immunohistochemistry at 2+, but negative by [facility] with a signals ratio of 1.47 and number per cell 2.20.  Biopsy of the right breast lesion at the 9 o'clock position showed a sclerotic lesion with calcifications and negative for carcinoma.  The patient's subsequent history is as detailed below.   PAST MEDICAL HISTORY: Past Medical History:  Diagnosis Date  . Anemia   . Blood transfusion 2005  . Hernia   . HH (hiatus hernia)   . Hyperlipidemia   . Hypertension   . PAH (pulmonary artery hypertension) ([place])   . Sarcoidosis   . Shortness of breath    on exertion  . Sleep apnea    does not use cpap  . Wheezing     PAST SURGICAL HISTORY: Past  Surgical History:  Procedure Laterality Date  . ABDOMINAL HYSTERECTOMY  1988  . BACK SURGERY  2005   lower  . BREAST LUMPECTOMY WITH RADIOACTIVE SEED AND SENTINEL LYMPH NODE BIOPSY Right 07/02/2018   Procedure: RIGHT BREAST  RADIOACTIVE SEED LUMPECTOMY X2 AND RIGHT AXILLARY  SENTINEL LYMPH NODE BIOPSY;  Surgeon: [name], MD;  Location: [facility];  Service: General;  Laterality: Right;  . ESOPHAGOGASTRODUODENOSCOPY  06/24/2012   Procedure: ESOPHAGOGASTRODUODENOSCOPY (EGD);  Surgeon: [name], MD;  Location: [name];  Service: General;  Laterality: N/A;  . HIATAL HERNIA REPAIR  08/18/2012   Procedure: LAPAROSCOPIC REPAIR OF HIATAL HERNIA;  Surgeon: [name], MD;  Location: [facility];  Service: General;  Laterality: N/A;  Laparoscopic Hiatal Hernia Repair   . INSERTION OF MESH  08/18/2012   Procedure: INSERTION OF MESH;  Surgeon: [name], MD;  Location: [facility];  Service: General;;  . IR IMAGING GUIDED PORT INSERTION  07/19/2018  . PORTACATH PLACEMENT N/A 07/02/2018   Procedure: INSERTION PORT-A-CATH TRIED NO PORT IN PLACE;  Surgeon: [name], MD;  Location: [facility];  Service: General;  Laterality: N/A;  . VESICOVAGINAL FISTULA CLOSURE W/ TAH      FAMILY HISTORY Family History  Problem Relation Age of Onset  . Clotting disorder Mother        PE  . Heart disease Father   . Cancer Maternal Grandmother        not sure type, but spread to the brain.  . Coronary artery disease Other   . Cancer Other        ? type   She notes that her father died from heart disease at age 24. Patients' mother lives in a nursing home and is 71 (as of November 2019)-- she has a form of dementia. The patient has 3 brothers and 0 sisters. A maternal aunt had a form of cancer, not clear what it was.  The maternal grandmother had breast cancer, but was never treated because of her old age.  The patient does not think anyone in her family had ovarian cancer, but there are 2 others in her family that had colon cancer.    GYNECOLOGIC HISTORY:  No LMP recorded. Patient has had a hysterectomy. 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 She lives alone. She does not have any pets.  Her daughter Deatra Canter "Cantrece" Grandville Silos, 48, lives in [place] and works in Psychologist, educational. No grandchildren.  The patient belongs to [place]: In place, but she can't remember who her power of attorney is (believes it is her daughter).   HEALTH MAINTENANCE: Social History   Tobacco Use  . Smoking status: Never Smoker  . Smokeless tobacco: Never Used  Substance Use Topics  . Alcohol use: No  . Drug use: No    Colonoscopy: Dr. Collene Mares  PAP: yes, but not sure when  Bone density: No   Allergies  Allergen Reactions  . Penicillins Rash    Fine red bumps that spread all over the body. Has patient had a PCN reaction causing immediate rash, facial/tongue/throat swelling, SOB or lightheadedness with hypotension: No Has patient had a PCN reaction causing severe rash involving mucus membranes or skin necrosis: No Has patient  had a PCN reaction that required hospitalization: No Has patient had a PCN reaction occurring within the last 10 years: No If all of the above answers are "NO", then may proceed with Cephalosporin use.   . Skin Adhesives [Cyanoacrylate] Itching and Rash    Dermabond    Current Outpatient Medications  Medication Sig Dispense Refill  . Ascorbic Acid (VITAMIN C) 1000 MG tablet Take 1,000 mg by mouth every other day.     . Cholecalciferol (VITAMIN D) 2000 UNITS CAPS Take 2,000 Units by mouth daily.     . Coenzyme Q10 200 MG TABS Take 200 mg by mouth daily.    . hydrocortisone 2.5 % cream Apply 1 application topically 2 (two) times daily as needed (dry skin).    Marland Kitchen ibuprofen (ADVIL,MOTRIN) 200 MG tablet Take 400 mg by mouth every 6 (six) hours as needed for headache or moderate pain.    Marland Kitchen ketotifen (ALAWAY) 0.025 % ophthalmic  solution Place 1 drop into both eyes 2 (two) times daily.    Marland Kitchen lidocaine-prilocaine (EMLA) cream Apply to affected area once 30 g 3  . loperamide (IMODIUM A-D) 2 MG capsule Take 2 mg by mouth daily as needed for diarrhea or loose stools.    Marland Kitchen omeprazole (PRILOSEC) 20 MG capsule Take 20 mg by mouth daily.     . phenazopyridine (PYRIDIUM) 100 MG tablet Take 1 tablet (100 mg total) by mouth 3 (three) times daily as needed for pain. 10 tablet 0  . Polyvinyl Alcohol-Povidone PF (REFRESH) 1.4-0.6 % SOLN Place 1 drop into both eyes daily as needed (dry eyes).     . potassium gluconate 595 MG TABS Take 595 mg by mouth daily.     . prochlorperazine (COMPAZINE) 10 MG tablet Take 1 tablet (10 mg total) by mouth every 6 (six) hours as needed (Nausea or vomiting). 30 tablet 1  . simvastatin (ZOCOR) 20 MG tablet Take 20 mg by mouth every morning.     . sulfamethoxazole-trimethoprim (BACTRIM DS,SEPTRA DS) 800-160 MG tablet Take 1 tablet by mouth 2 (two) times daily. 14 tablet 0  . traMADol (ULTRAM) 50 MG tablet Take 1 tablet (50 mg total) by mouth every 6 (six) hours as needed. 15 tablet 1  . triamterene-hydrochlorothiazide (MAXZIDE-25) 37.5-25 MG per tablet Take 1 tablet by mouth every morning.      No current facility-administered medications for this visit.     OBJECTIVE: Middle-aged African-American woman who appears well  Vitals:   08/02/18 1140  BP: 119/74  Pulse: 86  Resp: 18  Temp: 98.3 F (36.8 C)  SpO2: 100%     Body mass index is 34.54 kg/m.   Wt Readings from Last 3 Encounters:  08/02/18 171 lb (77.6 kg)  07/19/18 169 lb (76.7 kg)  07/13/18 170 lb 8 oz (77.3 kg)      ECOG FS:1 - Symptomatic but completely ambulatory  Sclerae unicteric, pupils round and equal No cervical or supraclavicular adenopathy Lungs no rales or rhonchi Heart regular rate and rhythm Abd soft, nontender, positive bowel sounds MSK no focal spinal tenderness, no upper extremity lymphedema Neuro: nonfocal,  well oriented, positive affect Breasts: The right breast has undergone lumpectomy.  There is minimal distortion of the breast contour.  There is no suspicious finding.  The left breast is benign.  Both axillae are benign.  LAB RESULTS:  CMP     Component Value Date/Time   NA 141 07/27/2018 0950   K 3.7 07/27/2018 0950   CL 107  07/27/2018 0950   CO2 25 07/27/2018 0950   GLUCOSE 107 (H) 07/27/2018 0950   BUN 17 07/27/2018 0950   CREATININE 1.17 (H) 07/27/2018 0950   CALCIUM 9.2 07/27/2018 0950   PROT 7.0 07/27/2018 0950   ALBUMIN 3.6 07/27/2018 0950   AST 21 07/27/2018 0950   ALT 13 07/27/2018 0950   ALKPHOS 79 07/27/2018 0950   BILITOT 0.6 07/27/2018 0950   GFRNONAA 46 (L) 07/27/2018 0950   GFRAA 54 (L) 07/27/2018 0950    No results found for: TOTALPROTELP, ALBUMINELP, A1GS, A2GS, BETS, BETA2SER, GAMS, MSPIKE, SPEI  No results found for: KPAFRELGTCHN, LAMBDASER, KAPLAMBRATIO  Lab Results  Component Value Date   WBC 4.5 08/02/2018   NEUTROABS PENDING 08/02/2018   HGB 11.4 (L) 08/02/2018   HCT 34.4 (L) 08/02/2018   MCV 91.0 08/02/2018   PLT 175 08/02/2018    _0 @  No results found for: LABCA2  No components found for: FYBOFB510  No results for input(s): INR in the last 168 hours.  No results found for: LABCA2  No results found for: CHE527  No results found for: POE423  No results found for: NTI144  No results found for: CA2729  No components found for: HGQUANT  No results found for: CEA1 / No results found for: CEA1   No results found for: AFPTUMOR  No results found for: CHROMOGRNA  No results found for: PSA1  Appointment on 08/02/2018  Component Date Value Ref Range Status  . WBC Count 08/02/2018 4.5  4.0 - 10.5 K/uL Final  . RBC 08/02/2018 3.78* 3.87 - 5.11 MIL/uL Final  . Hemoglobin 08/02/2018 11.4* 12.0 - 15.0 g/dL Final  . HCT 08/02/2018 34.4* 36.0 - 46.0 % Final  . MCV 08/02/2018 91.0  80.0 - 100.0 fL Final  . MCH 08/02/2018  30.2  26.0 - 34.0 pg Final  . MCHC 08/02/2018 33.1  30.0 - 36.0 g/dL Final  . RDW 08/02/2018 12.7  11.5 - 15.5 % Final  . Platelet Count 08/02/2018 175  150 - 400 K/uL Final  . nRBC 08/02/2018 0.0  0.0 - 0.2 % Final   Performed at [facility], [place] [address], [place], [place] [address]  . Neutrophils Relative % 08/02/2018 PENDING  % Incomplete  . Neutro Abs 08/02/2018 PENDING  1.7 - 7.7 K/uL Incomplete  . Band Neutrophils 08/02/2018 PENDING  % Incomplete  . Lymphocytes Relative 08/02/2018 PENDING  % Incomplete  . Lymphs Abs 08/02/2018 PENDING  0.7 - 4.0 K/uL Incomplete  . Monocytes Relative 08/02/2018 PENDING  % Incomplete  . Monocytes Absolute 08/02/2018 PENDING  0.1 - 1.0 K/uL Incomplete  . Eosinophils Relative 08/02/2018 PENDING  % Incomplete  . Eosinophils Absolute 08/02/2018 PENDING  0.0 - 0.5 K/uL Incomplete  . Basophils Relative 08/02/2018 PENDING  % Incomplete  . Basophils Absolute 08/02/2018 PENDING  0.0 - 0.1 K/uL Incomplete  . WBC Morphology 08/02/2018 PENDING   Incomplete  . RBC Morphology 08/02/2018 PENDING   Incomplete  . Smear Review 08/02/2018 PENDING   Incomplete  . Other 08/02/2018 PENDING  % Incomplete  . nRBC 08/02/2018 PENDING  0 /100 WBC Incomplete  . Metamyelocytes Relative 08/02/2018 PENDING  % Incomplete  . Myelocytes 08/02/2018 PENDING  % Incomplete  . Promyelocytes Relative 08/02/2018 PENDING  % Incomplete  . Blasts 08/02/2018 PENDING  % Incomplete    (this displays the last labs from the last 3 days)  No results found for: TOTALPROTELP, ALBUMINELP, A1GS, A2GS, BETS, BETA2SER, GAMS, MSPIKE, SPEI (this  displays SPEP labs)  No results found for: KPAFRELGTCHN, LAMBDASER, KAPLAMBRATIO (kappa/lambda light chains)  No results found for: HGBA, HGBA2QUANT, HGBFQUANT, HGBSQUAN (Hemoglobinopathy evaluation)   No results found for: LDH  No results found for: IRON, TIBC, IRONPCTSAT (Iron and TIBC)  No results found for:  FERRITIN  Urinalysis    Component Value Date/Time   COLORURINE STRAW (A) 07/27/2018 1050   APPEARANCEUR CLEAR 07/27/2018 1050   LABSPEC 1.011 07/27/2018 1050   PHURINE 6.0 07/27/2018 1050   GLUCOSEU NEGATIVE 07/27/2018 1050   HGBUR SMALL (A) 07/27/2018 1050   BILIRUBINUR NEGATIVE 07/27/2018 1050   KETONESUR NEGATIVE 07/27/2018 1050   PROTEINUR NEGATIVE 07/27/2018 1050   UROBILINOGEN 1.0 11/21/2012 1402   NITRITE NEGATIVE 07/27/2018 1050   LEUKOCYTESUR MODERATE (A) 07/27/2018 1050     STUDIES:  Ir Imaging Guided Port Insertion  Result Date: 07/19/2018 INDICATION: 73 year old female with right-sided breast cancer and bilateral renal cell cancer. She presents for portacatheter placement for chemotherapy. EXAM: IMPLANTED PORT A CATH PLACEMENT WITH ULTRASOUND AND FLUOROSCOPIC GUIDANCE MEDICATIONS: 1 g vancomycin; The antibiotic was administered within an appropriate time interval prior to skin puncture. ANESTHESIA/SEDATION: Versed 0.5 mg IV; Fentanyl 25 mcg IV; Moderate Sedation Time:  22 minutes The patient was continuously monitored during the procedure by the interventional radiology nurse under my direct supervision. FLUOROSCOPY TIME:  0 minutes, 6 seconds (1 mGy) COMPLICATIONS: None immediate. PROCEDURE: The right neck and chest was prepped with chlorhexidine, and draped in the usual sterile fashion using maximum barrier technique (cap and mask, sterile gown, sterile gloves, large sterile sheet, hand hygiene and cutaneous antiseptic). Antibiotic prophylaxis was provided with 1g vancomycin administered IV one hour prior to skin incision. Local anesthesia was attained by infiltration with 1% lidocaine with epinephrine. Ultrasound demonstrated patency of the right internal jugular vein, and this was documented with an image. Under real-time ultrasound guidance, this vein was accessed with a 21 gauge micropuncture needle and image documentation was performed. A small dermatotomy was made at the  access site with an 11 scalpel. A 0.018" wire was advanced into the SVC and the access needle exchanged for a 22F micropuncture vascular sheath. The 0.018" wire was then removed and a 0.035" wire advanced into the IVC. An appropriate location for the subcutaneous reservoir was selected below the clavicle and an incision was made through the skin and underlying soft tissues. The subcutaneous tissues were then dissected using a combination of blunt and sharp surgical technique and a pocket was formed. A single lumen power injectable portacatheter was then tunneled through the subcutaneous tissues from the pocket to the dermatotomy and the port reservoir placed within the subcutaneous pocket. The venous access site was then serially dilated and a peel away vascular sheath placed over the wire. The wire was removed and the port catheter advanced into position under fluoroscopic guidance. The catheter tip is positioned in the superior cavoatrial junction. This was documented with a spot image. The portacatheter was then tested and found to flush and aspirate well. The port was flushed with saline followed by 100 units/mL heparinized saline. The pocket was then closed in two layers using first subdermal inverted interrupted absorbable sutures followed by a running subcuticular suture. The dermatotomy at the venous access site was also closed with a single inverted subdermal suture. IMPRESSION: Successful placement of a right IJ approach Power Port with ultrasound and fluoroscopic guidance. The catheter is ready for use. Signed, Criselda Peaches, MD, [facility] Radiology Electronically  Signed   By: Jacqulynn Cadet M.D.   On: 07/19/2018 10:36    ELIGIBLE FOR AVAILABLE RESEARCH PROTOCOL: PREVENT  ASSESSMENT: 73 y.o. [place] woman status post right breast upper outer quadrant biopsy 06/02/2018 for a clinical T1b N0, stage IB invasive ductal carcinoma, grade 2,  triple negative, with an MIB-1 of 10%  (1) status post right lumpectomy and sentinel lymph node sampling 07/02/2018 for a pT1b pN0,stage IA invasive ductal carcinoma, grade 2, now HER-2 positive, still estrogen and progesterone receptor negative, with an MIB-1 of 1%  (2) adjuvant chemotherapy will consist of  paclitaxel x12 with trastuzumab given weekly  (3) trastuzumab to continue to total 1 year, given every 21 days  (a) echocardiogram 07/22/2018 showed an ejection fraction in the 55-60% range.  (3) adjuvant radiation to follow   PLAN: Robbin tolerated her first chemo and immunotherapy remarkably well.  She will receive her second dose today.  Apparently someone told her she was getting 19 chemotherapy treatments instead of 12.  We corrected that today.  She understands she will be losing her hair within the next 2 weeks.  I wrote her a prescription for a cranial prosthesis.  Also for bras.  She had forgotten that she met with Dr. Isidore Moos and that she will need adjuvant radiation.  That will start when she completes her chemotherapy.  Otherwise I am delighted that she is doing so well.  She will see [place] every other cycle until cycle 8 after which we will see her weekly.  She knows to call for any other issue that may develop before the next visit.    Jasia Hiltunen, [name], MD  08/02/18 11:52 AM Medical Oncology and Hematology [facility] [address] [place], [place] [address] Tel. [phone]    Fax. [phone]   I, Jacqualyn Posey am acting as a Education administrator for Chauncey Cruel, MD.   I, Lurline Del MD, have reviewed the above documentation for accuracy and completeness, and I agree with the above.

## 2018-08-02 ENCOUNTER — Encounter: Payer: Self-pay | Admitting: *Deleted

## 2018-08-02 ENCOUNTER — Inpatient Hospital Stay: Payer: Medicare Other

## 2018-08-02 ENCOUNTER — Inpatient Hospital Stay (HOSPITAL_BASED_OUTPATIENT_CLINIC_OR_DEPARTMENT_OTHER): Payer: Medicare Other | Admitting: Oncology

## 2018-08-02 VITALS — BP 119/74 | HR 86 | Temp 98.3°F | Resp 18 | Ht 59.0 in | Wt 171.0 lb

## 2018-08-02 DIAGNOSIS — Z171 Estrogen receptor negative status [ER-]: Principal | ICD-10-CM

## 2018-08-02 DIAGNOSIS — C50411 Malignant neoplasm of upper-outer quadrant of right female breast: Secondary | ICD-10-CM

## 2018-08-02 DIAGNOSIS — Z79899 Other long term (current) drug therapy: Secondary | ICD-10-CM

## 2018-08-02 DIAGNOSIS — Z95828 Presence of other vascular implants and grafts: Secondary | ICD-10-CM | POA: Insufficient documentation

## 2018-08-02 DIAGNOSIS — I10 Essential (primary) hypertension: Secondary | ICD-10-CM | POA: Diagnosis not present

## 2018-08-02 DIAGNOSIS — Z5111 Encounter for antineoplastic chemotherapy: Secondary | ICD-10-CM | POA: Diagnosis not present

## 2018-08-02 DIAGNOSIS — Z90710 Acquired absence of both cervix and uterus: Secondary | ICD-10-CM

## 2018-08-02 LAB — CMP (CANCER CENTER ONLY)
ALT: 14 U/L (ref 0–44)
AST: 18 U/L (ref 15–41)
Albumin: 3.7 g/dL (ref 3.5–5.0)
Alkaline Phosphatase: 73 U/L (ref 38–126)
Anion gap: 8 (ref 5–15)
BUN: 25 mg/dL — ABNORMAL HIGH (ref 8–23)
CO2: 25 mmol/L (ref 22–32)
Calcium: 9.4 mg/dL (ref 8.9–10.3)
Chloride: 103 mmol/L (ref 98–111)
Creatinine: 1.49 mg/dL — ABNORMAL HIGH (ref 0.44–1.00)
GFR, Est AFR Am: 40 mL/min — ABNORMAL LOW (ref >60)
GFR, Estimated: 34 mL/min — ABNORMAL LOW (ref >60)
Glucose, Bld: 97 mg/dL (ref 70–99)
POTASSIUM: 4.1 mmol/L (ref 3.5–5.1)
Sodium: 136 mmol/L (ref 135–145)
Total Bilirubin: 0.6 mg/dL (ref 0.3–1.2)
Total Protein: 7.1 g/dL (ref 6.5–8.1)

## 2018-08-02 LAB — CBC WITH DIFFERENTIAL (CANCER CENTER ONLY)
Abs Immature Granulocytes: 0.02 10*3/uL (ref 0.00–0.07)
Basophils Absolute: 0 10*3/uL (ref 0.0–0.1)
Basophils Relative: 0 %
Eosinophils Absolute: 0.1 10*3/uL (ref 0.0–0.5)
Eosinophils Relative: 3 %
HCT: 34.4 % — ABNORMAL LOW (ref 36.0–46.0)
Hemoglobin: 11.4 g/dL — ABNORMAL LOW (ref 12.0–15.0)
Immature Granulocytes: 0 %
Lymphocytes Relative: 33 %
Lymphs Abs: 1.5 10*3/uL (ref 0.7–4.0)
MCH: 30.2 pg (ref 26.0–34.0)
MCHC: 33.1 g/dL (ref 30.0–36.0)
MCV: 91 fL (ref 80.0–100.0)
Monocytes Absolute: 0.1 10*3/uL (ref 0.1–1.0)
Monocytes Relative: 3 %
NEUTROS ABS: 2.7 10*3/uL (ref 1.7–7.7)
Neutrophils Relative %: 61 %
Platelet Count: 175 10*3/uL (ref 150–400)
RBC: 3.78 MIL/uL — ABNORMAL LOW (ref 3.87–5.11)
RDW: 12.7 % (ref 11.5–15.5)
WBC Count: 4.5 10*3/uL (ref 4.0–10.5)
nRBC: 0 % (ref 0.0–0.2)

## 2018-08-02 MED ORDER — HEPARIN SOD (PORK) LOCK FLUSH 100 UNIT/ML IV SOLN
500.0000 [IU] | Freq: Once | INTRAVENOUS | Status: AC | PRN
  Administered 2018-08-02: 500 [IU]
  Filled 2018-08-02: qty 5

## 2018-08-02 MED ORDER — TRASTUZUMAB CHEMO 150 MG IV SOLR
150.0000 mg | Freq: Once | INTRAVENOUS | Status: AC
  Administered 2018-08-02: 150 mg via INTRAVENOUS
  Filled 2018-08-02: qty 7.14

## 2018-08-02 MED ORDER — SODIUM CHLORIDE 0.9 % IV SOLN
80.0000 mg/m2 | Freq: Once | INTRAVENOUS | Status: AC
  Administered 2018-08-02: 144 mg via INTRAVENOUS
  Filled 2018-08-02: qty 24

## 2018-08-02 MED ORDER — ACETAMINOPHEN 325 MG PO TABS
ORAL_TABLET | ORAL | Status: AC
  Filled 2018-08-02: qty 2

## 2018-08-02 MED ORDER — ACETAMINOPHEN 325 MG PO TABS
650.0000 mg | ORAL_TABLET | Freq: Once | ORAL | Status: AC
  Administered 2018-08-02: 650 mg via ORAL

## 2018-08-02 MED ORDER — FAMOTIDINE IN NACL 20-0.9 MG/50ML-% IV SOLN
20.0000 mg | Freq: Once | INTRAVENOUS | Status: AC
  Administered 2018-08-02: 20 mg via INTRAVENOUS

## 2018-08-02 MED ORDER — DIPHENHYDRAMINE HCL 50 MG/ML IJ SOLN
INTRAMUSCULAR | Status: AC
  Filled 2018-08-02: qty 1

## 2018-08-02 MED ORDER — SODIUM CHLORIDE 0.9% FLUSH
10.0000 mL | INTRAVENOUS | Status: DC | PRN
  Administered 2018-08-02: 10 mL
  Filled 2018-08-02: qty 10

## 2018-08-02 MED ORDER — FAMOTIDINE IN NACL 20-0.9 MG/50ML-% IV SOLN
INTRAVENOUS | Status: AC
  Filled 2018-08-02: qty 50

## 2018-08-02 MED ORDER — DEXAMETHASONE SODIUM PHOSPHATE 10 MG/ML IJ SOLN
INTRAMUSCULAR | Status: AC
  Filled 2018-08-02: qty 1

## 2018-08-02 MED ORDER — DEXAMETHASONE SODIUM PHOSPHATE 10 MG/ML IJ SOLN
10.0000 mg | Freq: Once | INTRAMUSCULAR | Status: AC
  Administered 2018-08-02: 10 mg via INTRAVENOUS

## 2018-08-02 MED ORDER — SODIUM CHLORIDE 0.9 % IV SOLN
Freq: Once | INTRAVENOUS | Status: AC
  Administered 2018-08-02: 14:00:00 via INTRAVENOUS
  Filled 2018-08-02: qty 250

## 2018-08-02 MED ORDER — SODIUM CHLORIDE 0.9% FLUSH
10.0000 mL | Freq: Once | INTRAVENOUS | Status: AC
  Administered 2018-08-02: 10 mL
  Filled 2018-08-02: qty 10

## 2018-08-02 MED ORDER — DIPHENHYDRAMINE HCL 50 MG/ML IJ SOLN
25.0000 mg | Freq: Once | INTRAMUSCULAR | Status: AC
  Administered 2018-08-02: 25 mg via INTRAVENOUS

## 2018-08-02 NOTE — Research (Signed)
DCP-001 USE OF A CLINICAL TRIAL SCREENING TOOL TO ADDRESS CANCER HEALTH DISPARITIES IN THE [employer] COMMUNITY ONCOLOGY RESEARCH PROGRAM (NCORP). Patient declined the [id number] Upbeat Study due to time constraints. Met with patient and her friend for 15 minutes in the infusion room. Reminded patient about the DCP study and that her participation is completelyvoluntary. Informed patient the purpose of this study is to understand whoparticipates in clinical trials and for those who don't, the reasons why. The information will help researchers design future studies. The study involvesa one time consent and collection of demographic variables with the majority ofdata collected fromhermedical record. No patient identifiers are being reported via the screening tool. Patient said she was interested in participating in this study. Reviewed the consent form for protocol version date2/21/19 and HIPPA form dated 09/11/14 in their entirety with patient, includingthe purpose of the study, potential risksand benefits. Patient verbalized understanding, denied any questions and agreed to participate. She signed and dated the above forms. Research nurse then asked patient the demographic questions for the study and patient answered.  Copy of signed consent/hippa forms given topatient for her records.  Thanked patient for her time today and participation in this study. Patient meets eligibility and will be enrolled on this study. Original signed consent and HIPPA forms along with completed worksheet placed in basket for research specialist, Remer Macho, to register patient to study.  Foye Spurling, BSN, RN Clinical Research Nurse 08/02/2018 2:26 PM

## 2018-08-02 NOTE — Progress Notes (Signed)
Spoke w/ [name], okay to switch trastuzumab to Herceptin (from [place]) since this is what was authorized by insurance. Orders updated.   Demetrius Charity, PharmD, [facility] Pharmac[facility] Pharmacy Phone: [phone] 08/02/2018

## 2018-08-02 NOTE — Patient Instructions (Signed)
Dehydration, Adult  Dehydration is when there is not enough fluid or water in your body. This happens when you lose more fluids than you take in. Dehydration can range from mild to very bad. It should be treated right away to keep it from getting very bad. Symptoms of mild dehydration may include:  Thirst.  Dry lips.  Slightly dry mouth.  Dry, warm skin.  Dizziness. Symptoms of moderate dehydration may include:  Very dry mouth.  Muscle cramps.  Dark pee (urine). Pee may be the color of tea.  Your body making less pee.  Your eyes making fewer tears.  Heartbeat that is uneven or faster than normal (palpitations).  Headache.  Light-headedness, especially when you stand up from sitting.  Fainting (syncope). Symptoms of very bad dehydration may include:  Changes in skin, such as: ? Cold and clammy skin. ? Blotchy (mottled) or pale skin. ? Skin that does not quickly return to normal after being lightly pinched and let go (poor skin turgor).  Changes in body fluids, such as: ? Feeling very thirsty. ? Your eyes making fewer tears. ? Not sweating when body temperature is high, such as in hot weather. ? Your body making very little pee.  Changes in vital signs, such as: ? Weak pulse. ? Pulse that is more than 100 beats a minute when you are sitting still. ? Fast breathing. ? Low blood pressure.  Other changes, such as: ? Sunken eyes. ? Cold hands and feet. ? Confusion. ? Lack of energy (lethargy). ? Trouble waking up from sleep. ? Short-term weight loss. ? Unconsciousness. Follow these instructions at home:   If told by your doctor, drink an ORS: ? Make an ORS by using instructions on the package. ? Start by drinking small amounts, about  cup (120 mL) every 5-10 minutes. ? Slowly drink more until you have had the amount that your doctor said to have.  Drink enough clear fluid to keep your pee clear or pale yellow. If you were told to drink an ORS, finish the  ORS first, then start slowly drinking clear fluids. Drink fluids such as: ? Water. Do not drink only water by itself. Doing that can make the salt (sodium) level in your body get too low (hyponatremia). ? Ice chips. ? Fruit juice that you have added water to (diluted). ? Low-calorie sports drinks.  Avoid: ? Alcohol. ? Drinks that have a lot of sugar. These include high-calorie sports drinks, fruit juice that does not have water added, and soda. ? Caffeine. ? Foods that are greasy or have a lot of fat or sugar.  Take over-the-counter and prescription medicines only as told by your doctor.  Do not take salt tablets. Doing that can make the salt level in your body get too high (hypernatremia).  Eat foods that have minerals (electrolytes). Examples include bananas, oranges, potatoes, tomatoes, and spinach.  Keep all follow-up visits as told by your doctor. This is important. Contact a doctor if:  You have belly (abdominal) pain that: ? Gets worse. ? Stays in one area (localizes).  You have a rash.  You have a stiff neck.  You get angry or annoyed more easily than normal (irritability).  You are more sleepy than normal.  You have a harder time waking up than normal.  You feel: ? Weak. ? Dizzy. ? Very thirsty.  You have peed (urinated) only a small amount of very dark pee during 6-8 hours. Get help right away if:  You have   symptoms of very bad dehydration.  You cannot drink fluids without throwing up (vomiting).  Your symptoms get worse with treatment.  You have a fever.  You have a very bad headache.  You are throwing up or having watery poop (diarrhea) and it: ? Gets worse. ? Does not go away.  You have blood or something green (bile) in your throw-up.  You have blood in your poop (stool). This may cause poop to look black and tarry.  You have not peed in 6-8 hours.  You pass out (faint).  Your heart rate when you are sitting still is more than 100 beats a  minute.  You have trouble breathing. This information is not intended to replace advice given to you by your health care provider. Make sure you discuss any questions you have with your health care provider. Document Released: 05/03/2009 Document Revised: 01/25/2016 Document Reviewed: 08/31/2015 Elsevier Interactive Patient Education  2019 Elsevier Inc.  

## 2018-08-03 ENCOUNTER — Ambulatory Visit: Payer: Medicare Other | Admitting: Physical Therapy

## 2018-08-05 ENCOUNTER — Ambulatory Visit: Payer: Medicare Other | Admitting: Physical Therapy

## 2018-08-05 DIAGNOSIS — R293 Abnormal posture: Secondary | ICD-10-CM

## 2018-08-05 DIAGNOSIS — R600 Localized edema: Secondary | ICD-10-CM

## 2018-08-05 DIAGNOSIS — Z483 Aftercare following surgery for neoplasm: Secondary | ICD-10-CM

## 2018-08-05 DIAGNOSIS — C50411 Malignant neoplasm of upper-outer quadrant of right female breast: Secondary | ICD-10-CM | POA: Diagnosis not present

## 2018-08-05 NOTE — Therapy (Signed)
[place], [place], [address] Phone: [phone]   Fax:  [phone]  Physical Therapy Treatment  Patient Details  Name: Rachel Kelly MRN: [id number] Date of Birth: 12-10-1945 Referring Provider (PT): Dr. [name]   Encounter Date: 08/05/2018  PT End of Session - 08/05/18 1518    Visit Number  4    Number of Visits  10    Date for PT Re-Evaluation  08/23/18    PT Start Time  6962    PT Stop Time  1515    PT Time Calculation (min)  41 min    Activity Tolerance  Patient tolerated treatment well    Behavior During Therapy  [facility] for tasks assessed/performed       Past Medical History:  Diagnosis Date  . Anemia   . Blood transfusion 2005  . Hernia   . HH (hiatus hernia)   . Hyperlipidemia   . Hypertension   . PAH (pulmonary artery hypertension) ([place])   . Sarcoidosis   . Shortness of breath    on exertion  . Sleep apnea    does not use cpap  . Wheezing     Past Surgical History:  Procedure Laterality Date  . ABDOMINAL HYSTERECTOMY  1988  . BACK SURGERY  2005   lower  . BREAST LUMPECTOMY WITH RADIOACTIVE SEED AND SENTINEL LYMPH NODE BIOPSY Right 07/02/2018   Procedure: RIGHT BREAST RADIOACTIVE SEED LUMPECTOMY X2 AND RIGHT AXILLARY  SENTINEL LYMPH NODE BIOPSY;  Surgeon: [name], MD;  Location: [place];  Service: General;  Laterality: Right;  . ESOPHAGOGASTRODUODENOSCOPY  06/24/2012   Procedure: ESOPHAGOGASTRODUODENOSCOPY (EGD);  Surgeon: [name], MD;  Location: [name];  Service: General;  Laterality: N/A;  . HIATAL HERNIA REPAIR  08/18/2012   Procedure: LAPAROSCOPIC REPAIR OF HIATAL HERNIA;  Surgeon: [name], MD;  Location: [facility];  Service: General;  Laterality: N/A;  Laparoscopic Hiatal Hernia Repair   . INSERTION OF MESH  08/18/2012   Procedure: INSERTION OF MESH;  Surgeon: [name], MD;  Location: [facility];  Service: General;;  . IR  IMAGING GUIDED PORT INSERTION  07/19/2018  . PORTACATH PLACEMENT N/A 07/02/2018   Procedure: INSERTION PORT-A-CATH TRIED NO PORT IN PLACE;  Surgeon: [name], MD;  Location: [facility];  Service: General;  Laterality: N/A;  . VESICOVAGINAL FISTULA CLOSURE W/ TAH      There were no vitals filed for this visit.  Subjective Assessment - 08/05/18 1435    Subjective  Pt states she is feeling much better today.  She has nausea on Tuesday after her infusion . She wore the peach foam but found it too constricting     Pertinent History  Patient was diagnosed on 05/26/18 with right ER/PR negative and HER2 positive invasive ductal carcinoma breast cancer. Patient underwent a right lumpectomy and sentinel node biopsy (0/2 nodes positive) on 07/02/18. Ki67 is 10%.    Patient Stated Goals  See if I'm doing ok with my arm    Currently in Pain?  No/denies                       [facility] Adult PT Treatment/Exercise - 08/05/18 0001      Self-Care   Self-Care  Other Self-Care Comments    Other Self-Care Comments   pt reports she got a presciption for a compression bra from [name].  She was shown samples to know what to  look for.  Gave her demographics so she has the phone number of [facility] to call       Manual Therapy   Manual Therapy  Manual Lymphatic Drainage (MLD)    Manual Lymphatic Drainage (MLD)  instructed and performed. in supine : short neck avoiding right chest at port, superficial and deep abdominals. left axillary nodes  with right breast and chest towared left side., right inguinal nodes with right axillo-inguinal anastamosis , abdomen, breast and latearal chest pt with hand over hand istruction and use of mirror for visual cues, then to left sidelying for posterior interaxillary anastamosis, back and lateral chest then retrun to supine for rights shoudler ROM and more deep breathing              PT Education - 08/05/18 1518    Education Details  self MLD      Person(s) Educated  Patient    Methods  Explanation;Demonstration    Comprehension  Verbalized understanding;Returned demonstration          PT Long Term Goals - 08/05/18 1520      PT LONG TERM GOAL #1   Title  Patient will demonstrate she has returned to baseline realted to shoulder function and ROM post operatively.    Status  Achieved      PT LONG TERM GOAL #2   Title  Patient will report >/= 25% reduction in the sensitivity around her breast and axillary incisions.    Time  4    Status  On-going      PT LONG TERM GOAL #3   Title  Patient will report >/= 40% reduction in right breast edema and fullness to fit more comfortably in her bras.    Status  On-going      PT LONG TERM GOAL #4   Title  Patient will demonstrate self manual lymph drainage for management of her edema.    Status  Partially Met            Plan - 08/05/18 1519    Clinical Impression Statement  Pt reports she has lots of appointments and feels she will be able to follow through with MLD at home.  She may call for another appt after she gets compression bra if needed     Rehab Potential  Excellent    Clinical Impairments Affecting Rehab Potential  None currently - will begin chemo 07/27/2018 so that may effect her ability to attend PT appointments    PT Frequency  2x / week    PT Duration  4 weeks    PT Treatment/Interventions  ADLs/Self Care Home Management;Therapeutic exercise;Patient/family education;Manual lymph drainage;Scar mobilization;Passive range of motion;Manual techniques;Taping    PT Next Visit Plan  Review Breast manual lymph drainage and scar mobilization  Check effect of lined foam and t compression bra     Consulted and Agree with Plan of Care  Patient       Patient will benefit from skilled therapeutic intervention in order to improve the following deficits and impairments:  Postural dysfunction, Decreased range of motion, Decreased knowledge of precautions, Impaired UE functional  use, Pain  Visit Diagnosis: Abnormal posture  Aftercare following surgery for neoplasm  Localized edema     Problem List Patient Active Problem List   Diagnosis Date Noted  . Port-A-Cath in place 08/02/2018  . Pre-op evaluation 06/24/2018  . Malignant neoplasm of upper-outer quadrant of right breast in female, estrogen receptor negative ([place]) 06/07/2018  . Nausea &  vomiting 11/21/2012  . Bilateral renal masses 11/21/2012  . Back pain, thoracic 08/14/2012  . Obstructive sleep apnea 05/09/2012  . GERD (gastroesophageal reflux disease) 06/28/2010  . HYPERLIPIDEMIA 12/09/2007  . HYPERTENSION 12/09/2007  . Sarcoidosis 11/26/2007  . DYSPNEA 11/26/2007   Donato Heinz. Owens Shark PT  Norwood Levo 08/05/2018, 3:21 PM  [place], [place], [address] Phone: [phone]   Fax:  [phone]  Name: KAILE BIXLER MRN: [id number] Date of Birth: 09-23-1945

## 2018-08-05 NOTE — Patient Instructions (Signed)
First of all, check with your insurance company to see if provider is in network    [facility] (for wigs and compression sleeves / gloves/gauntlets )  [address] [place], [place] [address] [phone]  Will file some insurances --- call for appointment   [facility] (for mastectomy prosthetics and garments) [address] [place], [place] [address] [phone] Will file some insurances --- call for appointment  [facility]  [address]  , [place] [address] [phone] Lower extremity garments  [facility] [address] [place], [place]  [address] [phone]  Cathy Rubel ( Medicaid certified lymphedema fitter) [phone] [email or web address]  Melissa Meares  [facility]  [phone]  [facility] [address] [place],  [address] [phone]  Other Resources: National Lymphedema Network:  www.lymphnet.org www.K[employer]etraining.com for patient articles and self manual lymph drainage information www.lymphedemablog.com has informative articles.  www.compressionguru.com [email or web address].com [email or web address].com [email or web address].com 

## 2018-08-06 ENCOUNTER — Other Ambulatory Visit: Payer: Self-pay | Admitting: *Deleted

## 2018-08-06 DIAGNOSIS — C50411 Malignant neoplasm of upper-outer quadrant of right female breast: Secondary | ICD-10-CM

## 2018-08-06 DIAGNOSIS — Z171 Estrogen receptor negative status [ER-]: Principal | ICD-10-CM

## 2018-08-09 ENCOUNTER — Other Ambulatory Visit: Payer: Self-pay | Admitting: Hematology and Oncology

## 2018-08-09 ENCOUNTER — Telehealth: Payer: Self-pay

## 2018-08-09 ENCOUNTER — Other Ambulatory Visit: Payer: Self-pay

## 2018-08-09 ENCOUNTER — Inpatient Hospital Stay: Payer: Medicare Other

## 2018-08-09 VITALS — BP 128/72 | HR 74 | Temp 97.9°F | Resp 17

## 2018-08-09 DIAGNOSIS — Z171 Estrogen receptor negative status [ER-]: Principal | ICD-10-CM

## 2018-08-09 DIAGNOSIS — Z5111 Encounter for antineoplastic chemotherapy: Secondary | ICD-10-CM | POA: Diagnosis not present

## 2018-08-09 DIAGNOSIS — C50411 Malignant neoplasm of upper-outer quadrant of right female breast: Secondary | ICD-10-CM

## 2018-08-09 LAB — CBC WITH DIFFERENTIAL (CANCER CENTER ONLY)
Abs Immature Granulocytes: 0.02 10*3/uL (ref 0.00–0.07)
Basophils Absolute: 0 10*3/uL (ref 0.0–0.1)
Basophils Relative: 1 %
EOS PCT: 1 %
Eosinophils Absolute: 0 10*3/uL (ref 0.0–0.5)
HCT: 31.6 % — ABNORMAL LOW (ref 36.0–46.0)
Hemoglobin: 10.3 g/dL — ABNORMAL LOW (ref 12.0–15.0)
Immature Granulocytes: 1 %
Lymphocytes Relative: 43 %
Lymphs Abs: 1.2 10*3/uL (ref 0.7–4.0)
MCH: 30 pg (ref 26.0–34.0)
MCHC: 32.6 g/dL (ref 30.0–36.0)
MCV: 92.1 fL (ref 80.0–100.0)
MONO ABS: 0.1 10*3/uL (ref 0.1–1.0)
Monocytes Relative: 4 %
Neutro Abs: 1.4 10*3/uL — ABNORMAL LOW (ref 1.7–7.7)
Neutrophils Relative %: 50 %
Platelet Count: 251 10*3/uL (ref 150–400)
RBC: 3.43 MIL/uL — ABNORMAL LOW (ref 3.87–5.11)
RDW: 12.7 % (ref 11.5–15.5)
WBC: 2.8 10*3/uL — AB (ref 4.0–10.5)
nRBC: 0 % (ref 0.0–0.2)

## 2018-08-09 LAB — CMP (CANCER CENTER ONLY)
ALT: 19 U/L (ref 0–44)
AST: 24 U/L (ref 15–41)
Albumin: 3.7 g/dL (ref 3.5–5.0)
Alkaline Phosphatase: 65 U/L (ref 38–126)
Anion gap: 8 (ref 5–15)
BILIRUBIN TOTAL: 0.3 mg/dL (ref 0.3–1.2)
BUN: 16 mg/dL (ref 8–23)
CO2: 27 mmol/L (ref 22–32)
Calcium: 8.9 mg/dL (ref 8.9–10.3)
Chloride: 104 mmol/L (ref 98–111)
Creatinine: 1.41 mg/dL — ABNORMAL HIGH (ref 0.44–1.00)
GFR, Est AFR Am: 43 mL/min — ABNORMAL LOW (ref >60)
GFR, Estimated: 37 mL/min — ABNORMAL LOW (ref >60)
Glucose, Bld: 103 mg/dL — ABNORMAL HIGH (ref 70–99)
POTASSIUM: 3.9 mmol/L (ref 3.5–5.1)
Sodium: 139 mmol/L (ref 135–145)
Total Protein: 7 g/dL (ref 6.5–8.1)

## 2018-08-09 MED ORDER — TRASTUZUMAB CHEMO 150 MG IV SOLR
150.0000 mg | Freq: Once | INTRAVENOUS | Status: AC
  Administered 2018-08-09: 150 mg via INTRAVENOUS
  Filled 2018-08-09: qty 7.14

## 2018-08-09 MED ORDER — DEXAMETHASONE SODIUM PHOSPHATE 10 MG/ML IJ SOLN
10.0000 mg | Freq: Once | INTRAMUSCULAR | Status: AC
  Administered 2018-08-09: 10 mg via INTRAVENOUS

## 2018-08-09 MED ORDER — SODIUM CHLORIDE 0.9 % IV SOLN
Freq: Once | INTRAVENOUS | Status: AC
  Administered 2018-08-09: 14:00:00 via INTRAVENOUS
  Filled 2018-08-09: qty 250

## 2018-08-09 MED ORDER — SODIUM CHLORIDE 0.9% FLUSH
10.0000 mL | INTRAVENOUS | Status: DC | PRN
  Administered 2018-08-09: 10 mL
  Filled 2018-08-09: qty 10

## 2018-08-09 MED ORDER — DEXAMETHASONE SODIUM PHOSPHATE 10 MG/ML IJ SOLN
INTRAMUSCULAR | Status: AC
  Filled 2018-08-09: qty 1

## 2018-08-09 MED ORDER — SODIUM CHLORIDE 0.9 % IV SOLN
80.0000 mg/m2 | Freq: Once | INTRAVENOUS | Status: AC
  Administered 2018-08-09: 144 mg via INTRAVENOUS
  Filled 2018-08-09: qty 24

## 2018-08-09 MED ORDER — DIPHENHYDRAMINE HCL 50 MG/ML IJ SOLN
25.0000 mg | Freq: Once | INTRAMUSCULAR | Status: AC
  Administered 2018-08-09: 25 mg via INTRAVENOUS

## 2018-08-09 MED ORDER — HEPARIN SOD (PORK) LOCK FLUSH 100 UNIT/ML IV SOLN
500.0000 [IU] | Freq: Once | INTRAVENOUS | Status: AC | PRN
  Administered 2018-08-09: 500 [IU]
  Filled 2018-08-09: qty 5

## 2018-08-09 MED ORDER — FAMOTIDINE IN NACL 20-0.9 MG/50ML-% IV SOLN
INTRAVENOUS | Status: AC
  Filled 2018-08-09: qty 50

## 2018-08-09 MED ORDER — ACETAMINOPHEN 325 MG PO TABS
650.0000 mg | ORAL_TABLET | Freq: Once | ORAL | Status: AC
  Administered 2018-08-09: 650 mg via ORAL

## 2018-08-09 MED ORDER — ACETAMINOPHEN 325 MG PO TABS
ORAL_TABLET | ORAL | Status: AC
  Filled 2018-08-09: qty 2

## 2018-08-09 MED ORDER — DIPHENHYDRAMINE HCL 50 MG/ML IJ SOLN
INTRAMUSCULAR | Status: AC
  Filled 2018-08-09: qty 1

## 2018-08-09 MED ORDER — FAMOTIDINE IN NACL 20-0.9 MG/50ML-% IV SOLN
20.0000 mg | Freq: Once | INTRAVENOUS | Status: AC
  Administered 2018-08-09: 20 mg via INTRAVENOUS

## 2018-08-09 NOTE — Progress Notes (Signed)
Okay to treat with ANC 1.4 per Dr. Gudena. 

## 2018-08-09 NOTE — Patient Instructions (Signed)
[facility] Discharge Instructions for Patients Receiving Chemotherapy  Today you received the following chemotherapy agents Herceptin; taxol  To help prevent nausea and vomiting after your treatment, we encourage you to take your nausea medication as prescribed.  If you develop nausea and vomiting that is not controlled by your nausea medication, call the clinic.   BELOW ARE SYMPTOMS THAT SHOULD BE REPORTED IMMEDIATELY:  *FEVER GREATER THAN 100.5 F  *CHILLS WITH OR WITHOUT FEVER  NAUSEA AND VOMITING THAT IS NOT CONTROLLED WITH YOUR NAUSEA MEDICATION  *UNUSUAL SHORTNESS OF BREATH  *UNUSUAL BRUISING OR BLEEDING  TENDERNESS IN MOUTH AND THROAT WITH OR WITHOUT PRESENCE OF ULCERS  *URINARY PROBLEMS  *BOWEL PROBLEMS  UNUSUAL RASH Items with * indicate a potential emergency and should be followed up as soon as possible.  Feel free to call the clinic should you have any questions or concerns. The clinic phone number is [phone].  Please show the [facility] at check-in to the Emergency Department and triage nurse.  Paclitaxel injection What is this medicine? PACLITAXEL (PAK li TAX el) is a chemotherapy drug. It targets fast dividing cells, like cancer cells, and causes these cells to die. This medicine is used to treat ovarian cancer, breast cancer, lung cancer, Kaposi's sarcoma, and other cancers. This medicine may be used for other purposes; ask your health care provider or pharmacist if you have questions. COMMON BRAND NAME(S): Onxol, Taxol What should I tell my health care provider before I take this medicine? They need to know if you have any of these conditions: -history of irregular heartbeat -liver disease -low blood counts, like low white cell, platelet, or red cell counts -lung or breathing disease, like asthma -tingling of the fingers or toes, or other nerve disorder -an unusual or allergic reaction to paclitaxel, alcohol, polyoxyethylated  castor oil, other chemotherapy, other medicines, foods, dyes, or preservatives -pregnant or trying to get pregnant -breast-feeding How should I use this medicine? This drug is given as an infusion into a vein. It is administered in a hospital or clinic by a specially trained health care professional. Talk to your pediatrician regarding the use of this medicine in children. Special care may be needed. Overdosage: If you think you have taken too much of this medicine contact a poison control center or emergency room at once. NOTE: This medicine is only for you. Do not share this medicine with others. What if I miss a dose? It is important not to miss your dose. Call your doctor or health care professional if you are unable to keep an appointment. What may interact with this medicine? Do not take this medicine with any of the following medications: -disulfiram -metronidazole This medicine may also interact with the following medications: -antiviral medicines for hepatitis, HIV or AIDS -certain antibiotics like erythromycin and clarithromycin -certain medicines for fungal infections like ketoconazole and itraconazole -certain medicines for seizures like carbamazepine, phenobarbital, phenytoin -gemfibrozil -nefazodone -rifampin -St. John's wort This list may not describe all possible interactions. Give your health care provider a list of all the medicines, herbs, non-prescription drugs, or dietary supplements you use. Also tell them if you smoke, drink alcohol, or use illegal drugs. Some items may interact with your medicine. What should I watch for while using this medicine? Your condition will be monitored carefully while you are receiving this medicine. You will need important blood work done while you are taking this medicine. This medicine can cause serious allergic reactions. To reduce your risk  you will need to take other medicine(s) before treatment with this medicine. If you experience  allergic reactions like skin rash, itching or hives, swelling of the face, lips, or tongue, tell your doctor or health care professional right away. In some cases, you may be given additional medicines to help with side effects. Follow all directions for their use. This drug may make you feel generally unwell. This is not uncommon, as chemotherapy can affect healthy cells as well as cancer cells. Report any side effects. Continue your course of treatment even though you feel ill unless your doctor tells you to stop. Call your doctor or health care professional for advice if you get a fever, chills or sore throat, or other symptoms of a cold or flu. Do not treat yourself. This drug decreases your body's ability to fight infections. Try to avoid being around people who are sick. This medicine may increase your risk to bruise or bleed. Call your doctor or health care professional if you notice any unusual bleeding. Be careful brushing and flossing your teeth or using a toothpick because you may get an infection or bleed more easily. If you have any dental work done, tell your dentist you are receiving this medicine. Avoid taking products that contain aspirin, acetaminophen, ibuprofen, naproxen, or ketoprofen unless instructed by your doctor. These medicines may hide a fever. Do not become pregnant while taking this medicine. Women should inform their doctor if they wish to become pregnant or think they might be pregnant. There is a potential for serious side effects to an unborn child. Talk to your health care professional or pharmacist for more information. Do not breast-feed an infant while taking this medicine. Men are advised not to father a child while receiving this medicine. This product may contain alcohol. Ask your pharmacist or healthcare provider if this medicine contains alcohol. Be sure to tell all healthcare providers you are taking this medicine. Certain medicines, like metronidazole and  disulfiram, can cause an unpleasant reaction when taken with alcohol. The reaction includes flushing, headache, nausea, vomiting, sweating, and increased thirst. The reaction can last from 30 minutes to several hours. What side effects may I notice from receiving this medicine? Side effects that you should report to your doctor or health care professional as soon as possible: -allergic reactions like skin rash, itching or hives, swelling of the face, lips, or tongue -breathing problems -changes in vision -fast, irregular heartbeat -high or low blood pressure -mouth sores -pain, tingling, numbness in the hands or feet -signs of decreased platelets or bleeding - bruising, pinpoint red spots on the skin, black, tarry stools, blood in the urine -signs of decreased red blood cells - unusually weak or tired, feeling faint or lightheaded, falls -signs of infection - fever or chills, cough, sore throat, pain or difficulty passing urine -signs and symptoms of liver injury like dark yellow or brown urine; general ill feeling or flu-like symptoms; light-colored stools; loss of appetite; nausea; right upper belly pain; unusually weak or tired; yellowing of the eyes or skin -swelling of the ankles, feet, hands -unusually slow heartbeat Side effects that usually do not require medical attention (report to your doctor or health care professional if they continue or are bothersome): -diarrhea -hair loss -loss of appetite -muscle or joint pain -nausea, vomiting -pain, redness, or irritation at site where injected -tiredness This list may not describe all possible side effects. Call your doctor for medical advice about side effects. You may report side effects to FDA  at 1-800-FDA-1088. Where should I keep my medicine? This drug is given in a hospital or clinic and will not be stored at home. NOTE: This sheet is a summary. It may not cover all possible information. If you have questions about this medicine,  talk to your doctor, pharmacist, or health care provider.  2019 Elsevier/Gold Standard (2017-03-10 13:14:55)

## 2018-08-09 NOTE — Telephone Encounter (Signed)
LVM for patient to call back to let her know about labwork.

## 2018-08-09 NOTE — Telephone Encounter (Signed)
-----   Message from Gardenia Phlegm, NP sent at 08/09/2018  3:29 PM EST ----- Please call patient and make sure she is drinking plenty of water.  Her kidney function is increased.   ----- Message ----- From: Buel Ream, Lab In [place] Sent: 08/09/2018  12:05 PM EST To: Chauncey Cruel, MD

## 2018-08-13 ENCOUNTER — Other Ambulatory Visit: Payer: Self-pay | Admitting: *Deleted

## 2018-08-13 DIAGNOSIS — C50411 Malignant neoplasm of upper-outer quadrant of right female breast: Secondary | ICD-10-CM

## 2018-08-13 DIAGNOSIS — Z171 Estrogen receptor negative status [ER-]: Principal | ICD-10-CM

## 2018-08-16 ENCOUNTER — Telehealth: Payer: Self-pay | Admitting: *Deleted

## 2018-08-16 ENCOUNTER — Inpatient Hospital Stay: Payer: Medicare Other

## 2018-08-16 ENCOUNTER — Telehealth: Payer: Self-pay | Admitting: Oncology

## 2018-08-16 ENCOUNTER — Inpatient Hospital Stay: Payer: Medicare Other | Admitting: Adult Health

## 2018-08-16 ENCOUNTER — Encounter: Payer: Self-pay | Admitting: Adult Health

## 2018-08-16 VITALS — BP 109/77 | HR 78 | Temp 97.7°F | Resp 18 | Ht 59.0 in | Wt 165.5 lb

## 2018-08-16 DIAGNOSIS — T451X5A Adverse effect of antineoplastic and immunosuppressive drugs, initial encounter: Secondary | ICD-10-CM

## 2018-08-16 DIAGNOSIS — Z171 Estrogen receptor negative status [ER-]: Secondary | ICD-10-CM

## 2018-08-16 DIAGNOSIS — Z5111 Encounter for antineoplastic chemotherapy: Secondary | ICD-10-CM | POA: Diagnosis not present

## 2018-08-16 DIAGNOSIS — Z95828 Presence of other vascular implants and grafts: Secondary | ICD-10-CM

## 2018-08-16 DIAGNOSIS — C50411 Malignant neoplasm of upper-outer quadrant of right female breast: Secondary | ICD-10-CM

## 2018-08-16 DIAGNOSIS — G620 Drug-induced polyneuropathy: Secondary | ICD-10-CM | POA: Diagnosis not present

## 2018-08-16 LAB — CMP (CANCER CENTER ONLY)
ALBUMIN: 3.7 g/dL (ref 3.5–5.0)
ALT: 16 U/L (ref 0–44)
AST: 20 U/L (ref 15–41)
Alkaline Phosphatase: 62 U/L (ref 38–126)
Anion gap: 7 (ref 5–15)
BUN: 18 mg/dL (ref 8–23)
CHLORIDE: 104 mmol/L (ref 98–111)
CO2: 27 mmol/L (ref 22–32)
Calcium: 8.8 mg/dL — ABNORMAL LOW (ref 8.9–10.3)
Creatinine: 1.34 mg/dL — ABNORMAL HIGH (ref 0.44–1.00)
GFR, Est AFR Am: 45 mL/min — ABNORMAL LOW (ref >60)
GFR, Estimated: 39 mL/min — ABNORMAL LOW (ref >60)
Glucose, Bld: 118 mg/dL — ABNORMAL HIGH (ref 70–99)
Potassium: 3.7 mmol/L (ref 3.5–5.1)
SODIUM: 138 mmol/L (ref 135–145)
Total Bilirubin: 0.4 mg/dL (ref 0.3–1.2)
Total Protein: 6.9 g/dL (ref 6.5–8.1)

## 2018-08-16 LAB — CBC WITH DIFFERENTIAL (CANCER CENTER ONLY)
ABS IMMATURE GRANULOCYTES: 0.02 10*3/uL (ref 0.00–0.07)
Basophils Absolute: 0 10*3/uL (ref 0.0–0.1)
Basophils Relative: 1 %
Eosinophils Absolute: 0 10*3/uL (ref 0.0–0.5)
Eosinophils Relative: 1 %
HCT: 30.3 % — ABNORMAL LOW (ref 36.0–46.0)
Hemoglobin: 10.1 g/dL — ABNORMAL LOW (ref 12.0–15.0)
IMMATURE GRANULOCYTES: 1 %
Lymphocytes Relative: 48 %
Lymphs Abs: 1.4 10*3/uL (ref 0.7–4.0)
MCH: 30.2 pg (ref 26.0–34.0)
MCHC: 33.3 g/dL (ref 30.0–36.0)
MCV: 90.7 fL (ref 80.0–100.0)
Monocytes Absolute: 0.1 10*3/uL (ref 0.1–1.0)
Monocytes Relative: 5 %
Neutro Abs: 1.2 10*3/uL — ABNORMAL LOW (ref 1.7–7.7)
Neutrophils Relative %: 44 %
PLATELETS: 264 10*3/uL (ref 150–400)
RBC: 3.34 MIL/uL — ABNORMAL LOW (ref 3.87–5.11)
RDW: 12.8 % (ref 11.5–15.5)
WBC Count: 2.8 10*3/uL — ABNORMAL LOW (ref 4.0–10.5)
nRBC: 0 % (ref 0.0–0.2)

## 2018-08-16 MED ORDER — SODIUM CHLORIDE 0.9% FLUSH
10.0000 mL | Freq: Once | INTRAVENOUS | Status: AC
  Administered 2018-08-16: 10 mL
  Filled 2018-08-16: qty 10

## 2018-08-16 NOTE — Telephone Encounter (Signed)
Gave avs and calendar ° °

## 2018-08-16 NOTE — Patient Instructions (Signed)
You will not receive any treatment today.  Starting next week, you will receive every 3 week Herceptin dosing.  This is your "immunotherapy" for your breast cancer.  You will receive this every three weeks until the end of this year (06/2019).  We will need to do echocardiograms (ultrasounds) on your heart to make sure the heart is pumping the blood efficiently every 3 months while you are receiving the Herceptin.  I placed a referral for you to see Dr. Aundra Dubin in cardiology to review your echocardiograms.  You will also now be referred back to Dr. Isidore Moos in radiation oncology.    I have let your navigator, Bary Castilla, RN know about the updates.  Please reach out to [place] if you have any questions or concerns.       Trastuzumab injection for infusion What is this medicine? TRASTUZUMAB (tras TOO zoo mab) is a monoclonal antibody. It is used to treat breast cancer and stomach cancer. This medicine may be used for other purposes; ask your health care provider or pharmacist if you have questions. COMMON BRAND NAME(S): Herceptin, Calla Kicks, OGIVRI What should I tell my health care provider before I take this medicine? They need to know if you have any of these conditions: -heart disease -heart failure -lung or breathing disease, like asthma -an unusual or allergic reaction to trastuzumab, benzyl alcohol, or other medications, foods, dyes, or preservatives -pregnant or trying to get pregnant -breast-feeding How should I use this medicine? This drug is given as an infusion into a vein. It is administered in a hospital or clinic by a specially trained health care professional. Talk to your pediatrician regarding the use of this medicine in children. This medicine is not approved for use in children. Overdosage: If you think you have taken too much of this medicine contact a poison control center or emergency room at once. NOTE: This medicine is only for you. Do not share this medicine with  others. What if I miss a dose? It is important not to miss a dose. Call your doctor or health care professional if you are unable to keep an appointment. What may interact with this medicine? This medicine may interact with the following medications: -certain types of chemotherapy, such as daunorubicin, doxorubicin, epirubicin, and idarubicin This list may not describe all possible interactions. Give your health care provider a list of all the medicines, herbs, non-prescription drugs, or dietary supplements you use. Also tell them if you smoke, drink alcohol, or use illegal drugs. Some items may interact with your medicine. What should I watch for while using this medicine? Visit your doctor for checks on your progress. Report any side effects. Continue your course of treatment even though you feel ill unless your doctor tells you to stop. Call your doctor or health care professional for advice if you get a fever, chills or sore throat, or other symptoms of a cold or flu. Do not treat yourself. Try to avoid being around people who are sick. You may experience fever, chills and shaking during your first infusion. These effects are usually mild and can be treated with other medicines. Report any side effects during the infusion to your health care professional. Fever and chills usually do not happen with later infusions. Do not become pregnant while taking this medicine or for 7 months after stopping it. Women should inform their doctor if they wish to become pregnant or think they might be pregnant. Women of child-bearing potential will need to have a negative  pregnancy test before starting this medicine. There is a potential for serious side effects to an unborn child. Talk to your health care professional or pharmacist for more information. Do not breast-feed an infant while taking this medicine or for 7 months after stopping it. Women must use effective birth control with this medicine. What side  effects may I notice from receiving this medicine? Side effects that you should report to your doctor or health care professional as soon as possible: -allergic reactions like skin rash, itching or hives, swelling of the face, lips, or tongue -chest pain or palpitations -cough -dizziness -feeling faint or lightheaded, falls -fever -general ill feeling or flu-like symptoms -signs of worsening heart failure like breathing problems; swelling in your legs and feet -unusually weak or tired Side effects that usually do not require medical attention (report to your doctor or health care professional if they continue or are bothersome): -bone pain -changes in taste -diarrhea -joint pain -nausea/vomiting -weight loss This list may not describe all possible side effects. Call your doctor for medical advice about side effects. You may report side effects to FDA at 1-800-FDA-1088. Where should I keep my medicine? This drug is given in a hospital or clinic and will not be stored at home. NOTE: This sheet is a summary. It may not cover all possible information. If you have questions about this medicine, talk to your doctor, pharmacist, or health care provider.  2019 [employer] (2016-07-01 14:37:52)

## 2018-08-16 NOTE — Telephone Encounter (Signed)
Pt called with symptoms of "tingling in toes" and "extreme tiredness". Upon further discussion pt relate fatigue started on Thursday and she was unable to do routine walking until Sunday. She informed she typically does 6 laps around the track. Informed the neuropathy is now constant in bilateral toes, left foot toepads are red and neuropathy goes back to ball of foot. Informed pt that I would discuss these with Mendel Ryder as well as pt should discuss symptoms as well. Received verbal understanding Discussed pt symptoms with Mendel Ryder, NP.

## 2018-08-16 NOTE — Progress Notes (Addendum)
[facility]  [phone] [phone]     ID: Rachel Kelly DOB: 26-May-1946  MR#: [id number]  YTK#:[id number]  Patient Care Team: [name], MD as PCP - General (Family Medicine) Deneise Lever, MD as Referring Physician (Pulmonary Disease) [name], MD as Consulting Physician (General Surgery) [name], [name], MD as Consulting Physician (Oncology) Eppie Gibson, MD as Attending Physician (Radiation Oncology) Chelsea Aus, DDS as Consulting Physician (Dentistry) Harriett Sine, MD as Consulting Physician (Dermatology) Bensimhon, Shaune Pascal, MD as Consulting Physician (Cardiology) OTHER MD:  CHIEF COMPLAINT: HER-2 positive breast cancer  CURRENT TREATMENT: anti-HER-2 immunotherapy  INTERVAL HISTORY: Rachel Kelly returns today for follow-up and treatment of her HER-2 positive breast cancer. She is accompanied by daughter today.  She is here today to receive her third week of weekly Paclitaxel and Trastuzumab.  She notes that she has new onset peripheral neuropathy in her toes and soles of her feet.  It is not painful.  She notes she can feel it all the time.     REVIEW OF SYSTEMS: Rachel Kelly also experienced an increase in fatigue last week for a few days.  This past Wednesday and Thursday she had two episodes of vomiting, her nausea has since resolved.  Her bowels are normal.  She hasn't noted any fevers or chills.  Otherwise, she is without bowel/bladder issues. She denies cough, shortness of breath, chest pain, or palpitations.  A detailed ROS was otherwise non contributory.     HISTORY OF CURRENT ILLNESS: From the original intake note:  Rachel Kelly had routine screening mammography on 05/26/2018 showing a possible abnormality in the right breast. She underwent bilateral diagnostic mammography with tomography and right breast ultrasonography at [facility] on 06/02/2018 showing: breast density category C, suspicious mass at the 10  o'clock position 8 cm from the nipple. It measures 10 x 7 x 6 mm. Just adjacent to this mass, there is an irregular hypoechoic mass with punctate echogenic foci. It measures 9 x 8 x 3 mm. Additional indeterminate mass at the 9 o'clock position 6 cm from the nipple. It measures 6 x 4 x 4 mm. No suspicious right axillary lymphadenopathy.  Accordingly on 06/02/2018 she proceeded to biopsy of two of the right breast areas in question. The pathology from this procedure showed [id number]): at the 10 o'clock position, invasive ductal carcinoma, grade 2, estrogen and progesterone receptor negative, HER-2 equivocal by immunohistochemistry at 2+, but negative by [facility] with a signals ratio of 1.47 and number per cell 2.20.  Biopsy of the right breast lesion at the 9 o'clock position showed a sclerotic lesion with calcifications and negative for carcinoma.  The patient's subsequent history is as detailed below.   PAST MEDICAL HISTORY: Past Medical History:  Diagnosis Date  . Anemia   . Blood transfusion 2005  . Hernia   . HH (hiatus hernia)   . Hyperlipidemia   . Hypertension   . PAH (pulmonary artery hypertension) ([place])   . Sarcoidosis   . Shortness of breath    on exertion  . Sleep apnea    does not use cpap  . Wheezing     PAST SURGICAL HISTORY: Past Surgical History:  Procedure Laterality Date  . ABDOMINAL HYSTERECTOMY  1988  . BACK SURGERY  2005   lower  . BREAST LUMPECTOMY WITH RADIOACTIVE SEED AND SENTINEL LYMPH NODE BIOPSY Right 07/02/2018   Procedure: RIGHT BREAST RADIOACTIVE SEED LUMPECTOMY X2 AND RIGHT AXILLARY  SENTINEL LYMPH NODE BIOPSY;  Surgeon: [name], MD;  Location: [facility];  Service: General;  Laterality: Right;  . ESOPHAGOGASTRODUODENOSCOPY  06/24/2012   Procedure: ESOPHAGOGASTRODUODENOSCOPY (EGD);  Surgeon: [name], MD;  Location: [name];  Service: General;  Laterality: N/A;  . HIATAL HERNIA REPAIR  08/18/2012   Procedure: LAPAROSCOPIC REPAIR OF  HIATAL HERNIA;  Surgeon: [name], MD;  Location: [facility];  Service: General;  Laterality: N/A;  Laparoscopic Hiatal Hernia Repair   . INSERTION OF MESH  08/18/2012   Procedure: INSERTION OF MESH;  Surgeon: [name], MD;  Location: [facility];  Service: General;;  . IR IMAGING GUIDED PORT INSERTION  07/19/2018  . PORTACATH PLACEMENT N/A 07/02/2018   Procedure: INSERTION PORT-A-CATH TRIED NO PORT IN PLACE;  Surgeon: [name], MD;  Location: [facility];  Service: General;  Laterality: N/A;  . VESICOVAGINAL FISTULA CLOSURE W/ TAH      FAMILY HISTORY Family History  Problem Relation Age of Onset  . Clotting disorder Mother        PE  . Heart disease Father   . Cancer Maternal Grandmother        not sure type, but spread to the brain.  . Coronary artery disease Other   . Cancer Other        ? type   She notes that her father died from heart disease at age 61. Patients' mother lives in a nursing home and is 36 (as of November 2019)-- she has a form of dementia. The patient has 3 brothers and 0 sisters. A maternal aunt had a form of cancer, not clear what it was.  The maternal grandmother had breast cancer, but was never treated because of her old age.  The patient does not think anyone in her family had ovarian cancer, but there are 2 others in her family that had colon cancer.    GYNECOLOGIC HISTORY:  No LMP recorded. Patient has had a hysterectomy. 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 She lives alone. She does not have any pets.  Her daughter Rachel Kelly, 48, lives in [place] and works in Psychologist, educational. No grandchildren.  The patient belongs to [place]: In place, but she can't remember who her power of  attorney is (believes it is her daughter).   HEALTH MAINTENANCE: Social History   Tobacco Use  . Smoking status: Never Smoker  . Smokeless tobacco: Never Used  Substance Use Topics  . Alcohol use: No  . Drug use: No    Colonoscopy: Dr. Collene Mares  PAP: yes, but not sure when  Bone density: No   Allergies  Allergen Reactions  . Penicillins Rash    Fine red bumps that spread all over the body. Has patient had a PCN reaction causing immediate rash, facial/tongue/throat swelling, SOB or lightheadedness with hypotension: No Has patient had a PCN reaction causing severe rash involving mucus membranes or skin necrosis: No Has patient had a PCN reaction that required hospitalization: No Has patient had a PCN reaction occurring within the last 10 years: No If all of the above answers are "NO", then may proceed with Cephalosporin use.   . Skin Adhesives [Cyanoacrylate] Itching and Rash    Dermabond    Current Outpatient  Medications  Medication Sig Dispense Refill  . Ascorbic Acid (VITAMIN C) 1000 MG tablet Take 1,000 mg by mouth every other day.     . Cholecalciferol (VITAMIN D) 2000 UNITS CAPS Take 2,000 Units by mouth daily.     . Coenzyme Q10 200 MG TABS Take 200 mg by mouth daily.    . hydrocortisone 2.5 % cream Apply 1 application topically 2 (two) times daily as needed (dry skin).    Marland Kitchen ibuprofen (ADVIL,MOTRIN) 200 MG tablet Take 400 mg by mouth every 6 (six) hours as needed for headache or moderate pain.    Marland Kitchen ketotifen (ALAWAY) 0.025 % ophthalmic solution Place 1 drop into both eyes 2 (two) times daily.    Marland Kitchen lidocaine-prilocaine (EMLA) cream Apply to affected area once 30 g 3  . loperamide (IMODIUM A-D) 2 MG capsule Take 2 mg by mouth daily as needed for diarrhea or loose stools.    Marland Kitchen omeprazole (PRILOSEC) 20 MG capsule Take 20 mg by mouth daily.     . phenazopyridine (PYRIDIUM) 100 MG tablet Take 1 tablet (100 mg total) by mouth 3 (three) times daily as needed for pain. 10 tablet 0    . Polyvinyl Alcohol-Povidone PF (REFRESH) 1.4-0.6 % SOLN Place 1 drop into both eyes daily as needed (dry eyes).     . potassium gluconate 595 MG TABS Take 595 mg by mouth daily.     . prochlorperazine (COMPAZINE) 10 MG tablet Take 1 tablet (10 mg total) by mouth every 6 (six) hours as needed (Nausea or vomiting). 30 tablet 1  . simvastatin (ZOCOR) 20 MG tablet Take 20 mg by mouth every morning.     . sulfamethoxazole-trimethoprim (BACTRIM DS,SEPTRA DS) 800-160 MG tablet Take 1 tablet by mouth 2 (two) times daily. 14 tablet 0  . traMADol (ULTRAM) 50 MG tablet Take 1 tablet (50 mg total) by mouth every 6 (six) hours as needed. 15 tablet 1  . triamterene-hydrochlorothiazide (MAXZIDE-25) 37.5-25 MG per tablet Take 1 tablet by mouth every morning.      No current facility-administered medications for this visit.     OBJECTIVE:   Vitals:   08/16/18 1305  BP: 109/77  Pulse: 78  Resp: 18  Temp: 97.7 F (36.5 C)  SpO2: 100%     Body mass index is 33.43 kg/m.   Wt Readings from Last 3 Encounters:  08/16/18 165 lb 8 oz (75.1 kg)  08/02/18 171 lb (77.6 kg)  07/19/18 169 lb (76.7 kg)  ECOG FS:1 - Symptomatic but completely ambulatory GENERAL: Patient is a well appearing female in no acute distress HEENT:  Sclerae anicteric.  Oropharynx clear and moist. No ulcerations or evidence of oropharyngeal candidiasis. Neck is supple.  NODES:  No cervical, supraclavicular, or axillary lymphadenopathy palpated.  BREAST EXAM:  Deferred. LUNGS:  Clear to auscultation bilaterally.  No wheezes or rhonchi. HEART:  Regular rate and rhythm. No murmur appreciated. ABDOMEN:  Soft, nontender.  Positive, normoactive bowel sounds. No organomegaly palpated. MSK:  No focal spinal tenderness to palpation. Full range of motion bilaterally in the upper extremities. EXTREMITIES:  No peripheral edema.   SKIN:  Clear with no obvious rashes or skin changes. No nail dyscrasia. NEURO:  Nonfocal. Well oriented.  Appropriate  affect.   LAB RESULTS:  CMP     Component Value Date/Time   NA 138 08/16/2018 1155   K 3.7 08/16/2018 1155   CL 104 08/16/2018 1155   CO2 27 08/16/2018 1155   GLUCOSE 118 (H) 08/16/2018  1155   BUN 18 08/16/2018 1155   CREATININE 1.34 (H) 08/16/2018 1155   CALCIUM 8.8 (L) 08/16/2018 1155   PROT 6.9 08/16/2018 1155   ALBUMIN 3.7 08/16/2018 1155   AST 20 08/16/2018 1155   ALT 16 08/16/2018 1155   ALKPHOS 62 08/16/2018 1155   BILITOT 0.4 08/16/2018 1155   GFRNONAA 39 (L) 08/16/2018 1155   GFRAA 45 (L) 08/16/2018 1155    No results found for: TOTALPROTELP, ALBUMINELP, A1GS, A2GS, BETS, BETA2SER, GAMS, MSPIKE, SPEI  No results found for: KPAFRELGTCHN, LAMBDASER, KAPLAMBRATIO  Lab Results  Component Value Date   WBC 2.8 (L) 08/16/2018   NEUTROABS 1.2 (L) 08/16/2018   HGB 10.1 (L) 08/16/2018   HCT 30.3 (L) 08/16/2018   MCV 90.7 08/16/2018   PLT 264 08/16/2018    @LASTCHEMISTRY @  No results found for: LABCA2  No components found for: XBLTJQ300  No results for input(s): INR in the last 168 hours.  No results found for: LABCA2  No results found for: PQZ300  No results found for: TMA263  No results found for: FHL456  No results found for: CA2729  No components found for: HGQUANT  No results found for: CEA1 / No results found for: CEA1   No results found for: AFPTUMOR  No results found for: CHROMOGRNA  No results found for: PSA1  Appointment on 08/16/2018  Component Date Value Ref Range Status  . Sodium 08/16/2018 138  135 - 145 mmol/L Final  . Potassium 08/16/2018 3.7  3.5 - 5.1 mmol/L Final  . Chloride 08/16/2018 104  98 - 111 mmol/L Final  . CO2 08/16/2018 27  22 - 32 mmol/L Final  . Glucose, Bld 08/16/2018 118* 70 - 99 mg/dL Final  . BUN 08/16/2018 18  8 - 23 mg/dL Final  . Creatinine 08/16/2018 1.34* 0.44 - 1.00 mg/dL Final  . Calcium 08/16/2018 8.8* 8.9 - 10.3 mg/dL Final  . Total Protein 08/16/2018 6.9  6.5 - 8.1 g/dL Final  . Albumin  08/16/2018 3.7  3.5 - 5.0 g/dL Final  . AST 08/16/2018 20  15 - 41 U/L Final  . ALT 08/16/2018 16  0 - 44 U/L Final  . Alkaline Phosphatase 08/16/2018 62  38 - 126 U/L Final  . Total Bilirubin 08/16/2018 0.4  0.3 - 1.2 mg/dL Final  . GFR, Est Non Af Am 08/16/2018 39* >60 mL/min Final  . GFR, Est AFR Am 08/16/2018 45* >60 mL/min Final  . Anion gap 08/16/2018 7  5 - 15 Final   Performed at [facility], [place] [address], [place], [place] [address]  . WBC Count 08/16/2018 2.8* 4.0 - 10.5 K/uL Final  . RBC 08/16/2018 3.34* 3.87 - 5.11 MIL/uL Final  . Hemoglobin 08/16/2018 10.1* 12.0 - 15.0 g/dL Final  . HCT 08/16/2018 30.3* 36.0 - 46.0 % Final  . MCV 08/16/2018 90.7  80.0 - 100.0 fL Final  . MCH 08/16/2018 30.2  26.0 - 34.0 pg Final  . MCHC 08/16/2018 33.3  30.0 - 36.0 g/dL Final  . RDW 08/16/2018 12.8  11.5 - 15.5 % Final  . Platelet Count 08/16/2018 264  150 - 400 K/uL Final  . nRBC 08/16/2018 0.0  0.0 - 0.2 % Final  . Neutrophils Relative % 08/16/2018 44  % Final  . Neutro Abs 08/16/2018 1.2* 1.7 - 7.7 K/uL Final  . Lymphocytes Relative 08/16/2018 48  % Final  . Lymphs Abs 08/16/2018 1.4  0.7 - 4.0 K/uL Final  . Monocytes  Relative 08/16/2018 5  % Final  . Monocytes Absolute 08/16/2018 0.1  0.1 - 1.0 K/uL Final  . Eosinophils Relative 08/16/2018 1  % Final  . Eosinophils Absolute 08/16/2018 0.0  0.0 - 0.5 K/uL Final  . Basophils Relative 08/16/2018 1  % Final  . Basophils Absolute 08/16/2018 0.0  0.0 - 0.1 K/uL Final  . Immature Granulocytes 08/16/2018 1  % Final  . Abs Immature Granulocytes 08/16/2018 0.02  0.00 - 0.07 K/uL Final   Performed at [facility], [address] [place], [place] [address]    (this displays the last labs from the last 3 days)  No results found for: TOTALPROTELP, ALBUMINELP, A1GS, A2GS, BETS, BETA2SER, GAMS, MSPIKE, SPEI (this displays SPEP labs)  No results found for: KPAFRELGTCHN, LAMBDASER,  KAPLAMBRATIO (kappa/lambda light chains)  No results found for: HGBA, HGBA2QUANT, HGBFQUANT, HGBSQUAN (Hemoglobinopathy evaluation)   No results found for: LDH  No results found for: IRON, TIBC, IRONPCTSAT (Iron and TIBC)  No results found for: FERRITIN  Urinalysis    Component Value Date/Time   COLORURINE STRAW (A) 07/27/2018 1050   APPEARANCEUR CLEAR 07/27/2018 1050   LABSPEC 1.011 07/27/2018 1050   PHURINE 6.0 07/27/2018 1050   GLUCOSEU NEGATIVE 07/27/2018 1050   HGBUR SMALL (A) 07/27/2018 1050   BILIRUBINUR NEGATIVE 07/27/2018 1050   KETONESUR NEGATIVE 07/27/2018 1050   PROTEINUR NEGATIVE 07/27/2018 1050   UROBILINOGEN 1.0 11/21/2012 1402   NITRITE NEGATIVE 07/27/2018 1050   LEUKOCYTESUR MODERATE (A) 07/27/2018 1050     STUDIES:  Ir Imaging Guided Port Insertion  Result Date: 07/19/2018 INDICATION: 73 year old female with right-sided breast cancer and bilateral renal cell cancer. She presents for portacatheter placement for chemotherapy. EXAM: IMPLANTED PORT A CATH PLACEMENT WITH ULTRASOUND AND FLUOROSCOPIC GUIDANCE MEDICATIONS: 1 g vancomycin; The antibiotic was administered within an appropriate time interval prior to skin puncture. ANESTHESIA/SEDATION: Versed 0.5 mg IV; Fentanyl 25 mcg IV; Moderate Sedation Time:  22 minutes The patient was continuously monitored during the procedure by the interventional radiology nurse under my direct supervision. FLUOROSCOPY TIME:  0 minutes, 6 seconds (1 mGy) COMPLICATIONS: None immediate. PROCEDURE: The right neck and chest was prepped with chlorhexidine, and draped in the usual sterile fashion using maximum barrier technique (cap and mask, sterile gown, sterile gloves, large sterile sheet, hand hygiene and cutaneous antiseptic). Antibiotic prophylaxis was provided with 1g vancomycin administered IV one hour prior to skin incision. Local anesthesia was attained by infiltration with 1% lidocaine with epinephrine. Ultrasound demonstrated  patency of the right internal jugular vein, and this was documented with an image. Under real-time ultrasound guidance, this vein was accessed with a 21 gauge micropuncture needle and image documentation was performed. A small dermatotomy was made at the access site with an 11 scalpel. A 0.018" wire was advanced into the SVC and the access needle exchanged for a 74F micropuncture vascular sheath. The 0.018" wire was then removed and a 0.035" wire advanced into the IVC. An appropriate location for the subcutaneous reservoir was selected below the clavicle and an incision was made through the skin and underlying soft tissues. The subcutaneous tissues were then dissected using a combination of blunt and sharp surgical technique and a pocket was formed. A single lumen power injectable portacatheter was then tunneled through the subcutaneous tissues from the pocket to the dermatotomy and the port reservoir placed within the subcutaneous pocket. The venous access site was then serially dilated and a peel away vascular sheath placed over the wire. The  wire was removed and the port catheter advanced into position under fluoroscopic guidance. The catheter tip is positioned in the superior cavoatrial junction. This was documented with a spot image. The portacatheter was then tested and found to flush and aspirate well. The port was flushed with saline followed by 100 units/mL heparinized saline. The pocket was then closed in two layers using first subdermal inverted interrupted absorbable sutures followed by a running subcuticular suture. The dermatotomy at the venous access site was also closed with a single inverted subdermal suture. IMPRESSION: Successful placement of a right IJ approach Power Port with ultrasound and fluoroscopic guidance. The catheter is ready for use. Signed, Criselda Peaches, MD, [facility] Electronically Signed   By: Jacqulynn Cadet  M.D.   On: 07/19/2018 10:36    ELIGIBLE FOR AVAILABLE RESEARCH PROTOCOL: PREVENT  ASSESSMENT: 73 y.o. [place] woman status post right breast upper outer quadrant biopsy 06/02/2018 for a clinical T1b N0, stage IB invasive ductal carcinoma, grade 2, triple negative, with an MIB-1 of 10%  (1) status post right lumpectomy and sentinel lymph node sampling 07/02/2018 for a pT1b pN0,stage IA invasive ductal carcinoma, grade 2, now HER-2 positive, still estrogen and progesterone receptor negative, with an MIB-1 of 1%  (2) adjuvant chemotherapy will consist of paclitaxel x12 with trastuzumab given weekly  (a) paclitaxel discontinued after 3 doses because of significant  (3) trastuzumab to continue to total 1 year, given every 21 days  (a) echocardiogram 07/22/2018 showed an ejection fraction in the 55-60% range.  (3) adjuvant radiation to follow   PLAN: Goldye is here today for evaluation prior to receiving her treatment with Paclitaxel and Trastuzumab.  Today would have been week 4, however, I reviewed her grade 2 peripheral neuropathy with [name].  Due to this, we will stop the chemotherapy portion.  She will go onto receive Trastuzumab every 3 weeks to complete one year of Trastuzumab therapy.  Menaal reviewed this in detail with [name] who reviewed her recurrence risk with her and her daughter.    She will also at this point be referred to radiation oncology.  I placed this referral today.  I also put in a referral for her to be seen in the heart failure clinic by Dr. Aundra Dubin with her echocardiograms.  I will CC this note to Bary Castilla, breast navigator.    Kaelei will return in one week for labs and Trastuzumab.  We will see her back in 4 weeks for labs, evaluation and Trastuzumab.  She knows to call for any other issue that may develop before the next visit.  Wilber Bihari, NP  08/16/18 1:26 PM Medical Oncology and Hematology [facility] [place], [place] [address] Tel. [phone]    Fax. [phone]    ADDENDUM: Javeah has developed well defined neuropathy after only 3 cycles of paclitaxel.  I do not believe we can give her any more taxanes.  Given the very early early stage of her tumor and the fact that she will continue to receive Herceptin, I do not think we can switch to for example Cytoxan and doxorubicin, which could affect her heart function and certainly not Cytoxan and Taxotere which would worsen the neuropathy.    Accordingly the plan will be to continue trastuzumab alone, and proceed to radiation.  He is very much in favor of this plan.  I personally saw this patient and performed a substantive portion of this  encounter with the listed APP documented above.   Chauncey Cruel, MD Medical Oncology and Hematology [facility] [address] [place], [place] [address] Tel. [phone]    Fax. [phone]

## 2018-08-20 ENCOUNTER — Other Ambulatory Visit: Payer: Self-pay | Admitting: Oncology

## 2018-08-23 ENCOUNTER — Inpatient Hospital Stay: Payer: Medicare Other

## 2018-08-23 ENCOUNTER — Inpatient Hospital Stay: Payer: Medicare Other | Attending: Oncology

## 2018-08-23 DIAGNOSIS — Z79899 Other long term (current) drug therapy: Secondary | ICD-10-CM | POA: Insufficient documentation

## 2018-08-23 DIAGNOSIS — Z171 Estrogen receptor negative status [ER-]: Secondary | ICD-10-CM | POA: Insufficient documentation

## 2018-08-23 DIAGNOSIS — I10 Essential (primary) hypertension: Secondary | ICD-10-CM | POA: Insufficient documentation

## 2018-08-23 DIAGNOSIS — Z791 Long term (current) use of non-steroidal anti-inflammatories (NSAID): Secondary | ICD-10-CM | POA: Insufficient documentation

## 2018-08-23 DIAGNOSIS — C50411 Malignant neoplasm of upper-outer quadrant of right female breast: Secondary | ICD-10-CM | POA: Insufficient documentation

## 2018-08-23 DIAGNOSIS — Z5112 Encounter for antineoplastic immunotherapy: Secondary | ICD-10-CM | POA: Insufficient documentation

## 2018-08-23 DIAGNOSIS — G629 Polyneuropathy, unspecified: Secondary | ICD-10-CM | POA: Insufficient documentation

## 2018-08-23 DIAGNOSIS — Z90710 Acquired absence of both cervix and uterus: Secondary | ICD-10-CM | POA: Insufficient documentation

## 2018-08-26 NOTE — Progress Notes (Signed)
Location of Breast Cancer: Right Breast  Histology per Pathology Report:  06/02/18 Diagnosis 1. Breast, right, needle core biopsy, upper outer, 10 o'clock position (ribbon clip) - INVASIVE DUCTAL CARCINOMA, GRADE 2. SEE NOTE. - DUCTAL CARCINOMA IN SITU, INTERMEDIATE NUCLEAR GRADE. 2. Breast, right, needle core biopsy, outer, 9 o'clock position (coil clip) - BENIGN BREAST TISSUE SHOWING SCLEROTIC LESION WITH CALCIFICATIONS. SEE NOTE. - NEGATIVE FOR CARCINOMA.  Receptor Status: ER(NEG), PR (NEG), Her2-neu (NEG), Ki-(10%)  07/02/18 Diagnosis 1. Breast, lumpectomy, Right with seed #1 - INVASIVE DUCTAL CARCINOMA, 0.8 CM, [place] GRADE II OF III. - MARGINS OF RESECTION ARE NOT INVOLVED (CLOSEST MARGIN: 7 MM, POSTERIOR). - BIOPSY SITE CHANGES. - SEE ONCOLOGY TABLE. 2. Breast, lumpectomy, Right with seed #2 - BIOPSY SITE CHANGES. FIBROADENOMA, HYALINIZED. SCLEROSING ADENOSIS. USUAL DUCT EPITHELIAL HYPERPLASIA. FIBROCYSTIC CHANGES. MICROCALCIFICATIONS ARE ASSOCIATED WITH THESE PROCESSES. 3. Breast, excision, Additional lateral margin - BREAST PARENCHYMA, NEGATIVE FOR CARCINOMA. 4. Lymph node, sentinel, biopsy, Right axillary #1 - ONE LYMPH NODE, NEGATIVE FOR CARCINOMA (0/1). 5. Lymph node, sentinel, biopsy, SLN Right - ONE LYMPH NODE, NEGATIVE FOR CARCINOMA (0/1).  1. Receptor status: ER(NEG), PR(NEG), Her2 (POS), Ki(1%),   Did patient present with symptoms or was this found on screening mammography?: It was found on a screening mammogram.   Past/Anticipated interventions by surgeon, if any: 07/02/18 Procedure: Procedure(s): RIGHT BREAST RADIOACTIVE SEED LUMPECTOMY X2 AND RIGHT AXILLARY  SENTINEL LYMPH NODE BIOPSY Attempted placement of Port-A-Cath-aborted Surgeon: [name] T   Past/Anticipated interventions by medical oncology, if any:  [name] 08/16/18 ADDENDUM: Rachel Kelly has developed well defined neuropathy after only 3 cycles of paclitaxel.  I do not believe we  can give her any more taxanes. Given the very early early stage of her tumor and the fact that she will continue to receive Herceptin, I do not think we can switch to for example Cytoxan and doxorubicin, which could affect her heart function and certainly not Cytoxan and Taxotere which would worsen the neuropathy.   Accordingly the plan will be to continue trastuzumab alone, and proceed to radiation.  He is very much in favor of this plan. I personally saw this patient and performed a substantive portion of this encounter with the listed APP documented above.  Chauncey Cruel, MD  Lymphedema issues, if any: She denies. She has good mobility.     Pain issues, if any:  She denies pain.   SAFETY ISSUES:  Prior radiation? No  Pacemaker/ICD? No  Possible current pregnancy? No  Is the patient on methotrexate? No  Current Complaints / other details:    BP 129/86 (BP Location: Left Arm, Patient Position: Sitting)   Pulse 72   Temp 98.2 F (36.8 C) (Oral)   Resp 20   Ht _0  (1.499 m)   Wt 172 lb 3.2 oz (78.1 kg)   SpO2 99%   BMI 34.78 kg/m  Letti Towell, Stephani Police, RN 08/26/2018,2:30 PM

## 2018-08-27 ENCOUNTER — Telehealth: Payer: Self-pay | Admitting: Oncology

## 2018-08-27 NOTE — Telephone Encounter (Signed)
Scheduled appt per 2/7 sch message - left message for patient - let Rn and MD know that I was unable to get patient.

## 2018-08-30 ENCOUNTER — Inpatient Hospital Stay: Payer: Medicare Other

## 2018-08-30 ENCOUNTER — Telehealth: Payer: Self-pay | Admitting: Oncology

## 2018-08-30 ENCOUNTER — Other Ambulatory Visit: Payer: Medicare Other

## 2018-08-30 ENCOUNTER — Other Ambulatory Visit: Payer: Self-pay | Admitting: Oncology

## 2018-08-30 ENCOUNTER — Ambulatory Visit: Payer: Medicare Other

## 2018-08-30 ENCOUNTER — Ambulatory Visit: Payer: Medicare Other | Admitting: Adult Health

## 2018-08-30 ENCOUNTER — Inpatient Hospital Stay (HOSPITAL_BASED_OUTPATIENT_CLINIC_OR_DEPARTMENT_OTHER): Payer: Medicare Other | Admitting: Oncology

## 2018-08-30 VITALS — BP 142/83 | HR 81 | Temp 97.9°F | Resp 18 | Ht 59.0 in | Wt 170.4 lb

## 2018-08-30 DIAGNOSIS — Z90710 Acquired absence of both cervix and uterus: Secondary | ICD-10-CM | POA: Diagnosis not present

## 2018-08-30 DIAGNOSIS — Z79899 Other long term (current) drug therapy: Secondary | ICD-10-CM

## 2018-08-30 DIAGNOSIS — C50411 Malignant neoplasm of upper-outer quadrant of right female breast: Secondary | ICD-10-CM

## 2018-08-30 DIAGNOSIS — Z791 Long term (current) use of non-steroidal anti-inflammatories (NSAID): Secondary | ICD-10-CM | POA: Diagnosis not present

## 2018-08-30 DIAGNOSIS — Z95828 Presence of other vascular implants and grafts: Secondary | ICD-10-CM

## 2018-08-30 DIAGNOSIS — Z171 Estrogen receptor negative status [ER-]: Principal | ICD-10-CM

## 2018-08-30 DIAGNOSIS — G629 Polyneuropathy, unspecified: Secondary | ICD-10-CM | POA: Diagnosis not present

## 2018-08-30 DIAGNOSIS — I10 Essential (primary) hypertension: Secondary | ICD-10-CM | POA: Diagnosis not present

## 2018-08-30 DIAGNOSIS — Z5112 Encounter for antineoplastic immunotherapy: Secondary | ICD-10-CM | POA: Diagnosis present

## 2018-08-30 LAB — COMPREHENSIVE METABOLIC PANEL
ALBUMIN: 3.5 g/dL (ref 3.5–5.0)
ALT: 16 U/L (ref 0–44)
AST: 22 U/L (ref 15–41)
Alkaline Phosphatase: 60 U/L (ref 38–126)
Anion gap: 7 (ref 5–15)
BUN: 16 mg/dL (ref 8–23)
CO2: 25 mmol/L (ref 22–32)
Calcium: 8.9 mg/dL (ref 8.9–10.3)
Chloride: 109 mmol/L (ref 98–111)
Creatinine, Ser: 1.05 mg/dL — ABNORMAL HIGH (ref 0.44–1.00)
GFR calc Af Amer: >60 mL/min (ref >60)
GFR calc non Af Amer: 53 mL/min — ABNORMAL LOW (ref >60)
GLUCOSE: 103 mg/dL — AB (ref 70–99)
Potassium: 4 mmol/L (ref 3.5–5.1)
Sodium: 141 mmol/L (ref 135–145)
Total Bilirubin: 0.5 mg/dL (ref 0.3–1.2)
Total Protein: 6.5 g/dL (ref 6.5–8.1)

## 2018-08-30 LAB — CBC WITH DIFFERENTIAL/PLATELET
Abs Immature Granulocytes: 0.04 10*3/uL (ref 0.00–0.07)
BASOS PCT: 0 %
Basophils Absolute: 0 10*3/uL (ref 0.0–0.1)
EOS ABS: 0.1 10*3/uL (ref 0.0–0.5)
Eosinophils Relative: 1 %
HCT: 32.6 % — ABNORMAL LOW (ref 36.0–46.0)
Hemoglobin: 10.5 g/dL — ABNORMAL LOW (ref 12.0–15.0)
Immature Granulocytes: 1 %
Lymphocytes Relative: 26 %
Lymphs Abs: 1.5 10*3/uL (ref 0.7–4.0)
MCH: 30.1 pg (ref 26.0–34.0)
MCHC: 32.2 g/dL (ref 30.0–36.0)
MCV: 93.4 fL (ref 80.0–100.0)
Monocytes Absolute: 0.6 10*3/uL (ref 0.1–1.0)
Monocytes Relative: 10 %
Neutro Abs: 3.7 10*3/uL (ref 1.7–7.7)
Neutrophils Relative %: 62 %
Platelets: 217 10*3/uL (ref 150–400)
RBC: 3.49 MIL/uL — ABNORMAL LOW (ref 3.87–5.11)
RDW: 14 % (ref 11.5–15.5)
WBC: 5.9 10*3/uL (ref 4.0–10.5)
nRBC: 0 % (ref 0.0–0.2)

## 2018-08-30 MED ORDER — DIPHENHYDRAMINE HCL 25 MG PO CAPS
25.0000 mg | ORAL_CAPSULE | Freq: Once | ORAL | Status: AC
  Administered 2018-08-30: 25 mg via ORAL

## 2018-08-30 MED ORDER — DIPHENHYDRAMINE HCL 25 MG PO CAPS
ORAL_CAPSULE | ORAL | Status: AC
  Filled 2018-08-30: qty 1

## 2018-08-30 MED ORDER — SODIUM CHLORIDE 0.9% FLUSH
10.0000 mL | INTRAVENOUS | Status: DC | PRN
  Administered 2018-08-30: 10 mL
  Filled 2018-08-30: qty 10

## 2018-08-30 MED ORDER — HEPARIN SOD (PORK) LOCK FLUSH 100 UNIT/ML IV SOLN
500.0000 [IU] | Freq: Once | INTRAVENOUS | Status: AC | PRN
  Administered 2018-08-30: 500 [IU]
  Filled 2018-08-30: qty 5

## 2018-08-30 MED ORDER — SODIUM CHLORIDE 0.9% FLUSH
10.0000 mL | Freq: Once | INTRAVENOUS | Status: AC
  Administered 2018-08-30: 10 mL
  Filled 2018-08-30: qty 10

## 2018-08-30 MED ORDER — SODIUM CHLORIDE 0.9 % IV SOLN
Freq: Once | INTRAVENOUS | Status: AC
  Administered 2018-08-30: 10:00:00 via INTRAVENOUS
  Filled 2018-08-30: qty 250

## 2018-08-30 MED ORDER — ACETAMINOPHEN 325 MG PO TABS
ORAL_TABLET | ORAL | Status: AC
  Filled 2018-08-30: qty 2

## 2018-08-30 MED ORDER — TRASTUZUMAB CHEMO 150 MG IV SOLR
300.0000 mg | Freq: Once | INTRAVENOUS | Status: AC
  Administered 2018-08-30: 300 mg via INTRAVENOUS
  Filled 2018-08-30: qty 14.29

## 2018-08-30 MED ORDER — ACETAMINOPHEN 325 MG PO TABS
650.0000 mg | ORAL_TABLET | Freq: Once | ORAL | Status: AC
  Administered 2018-08-30: 650 mg via ORAL

## 2018-08-30 NOTE — Progress Notes (Signed)
Rachel Kelly  [phone]) [phone] [phone]) [phone]     ID: Rachel Kelly DOB: 12/11/1945  MR#: [id number]  YKD#:[id number]  Patient Care Team: [name], MD as PCP - General (Family Medicine) Deneise Lever, MD as Referring Physician (Pulmonary Disease) [name], MD as Consulting Physician (General Surgery) [name], [name], MD as Consulting Physician (Oncology) Eppie Gibson, MD as Attending Physician (Radiation Oncology) Chelsea Aus, DDS as Consulting Physician (Dentistry) Harriett Sine, MD as Consulting Physician (Dermatology) Bensimhon, Shaune Pascal, MD as Consulting Physician (Cardiology) OTHER MD:  CHIEF COMPLAINT: HER-2 positive, estrogen receptor negative breast cancer  CURRENT TREATMENT: anti-HER-2 immunotherapy  INTERVAL HISTORY: Rachel Kelly returns today for follow-up and treatment of her HER-2 positive breast cancer. She is accompanied by daughter today.    She was able to receive only 3 of the 12 planned doses of weekly paclitaxel because of the early development of peripheral neuropathy.  She is now receiving trastuzumab every 3 weeks, which will be continued to complete a year. She still feels some neuropathy in her toes. She denies tingling, but she notes a "feeling I call slight numbness, it's a feeling that's not natural." She denies issues with her hands.  Her most recent echocardiogram was on 07/22/2018 with EF of 55 - 60%.  She will be due for repeat in March.   REVIEW OF SYSTEMS: Rachel Kelly reports she walks for exercise. She states she "watched her hair fall out. It came out in one piece!" The patient denies unusual headaches, visual changes, nausea, vomiting, stiff neck, dizziness, or gait imbalance. There has been no cough, phlegm production, or pleurisy, no chest pain or pressure, and no change in bowel or bladder habits. The patient denies fever, rash, bleeding, unexplained fatigue or unexplained weight loss. A detailed review of  systems was otherwise entirely negative.   HISTORY OF CURRENT ILLNESS: From the original intake note:  Rachel Kelly had routine screening mammography on 05/26/2018 showing a possible abnormality in the right breast. She underwent bilateral diagnostic mammography with tomography and right breast ultrasonography at [facility] on 06/02/2018 showing: breast density category C, suspicious mass at the 10 o'clock position 8 cm from the nipple. It measures 10 x 7 x 6 mm. Just adjacent to this mass, there is an irregular hypoechoic mass with punctate echogenic foci. It measures 9 x 8 x 3 mm. Additional indeterminate mass at the 9 o'clock position 6 cm from the nipple. It measures 6 x 4 x 4 mm. No suspicious right axillary lymphadenopathy.  Accordingly on 06/02/2018 she proceeded to biopsy of two of the right breast areas in question. The pathology from this procedure showed [id number]): at the 10 o'clock position, invasive ductal carcinoma, grade 2, estrogen and progesterone receptor negative, HER-2 equivocal by immunohistochemistry at 2+, but negative by [facility] with a signals ratio of 1.47 and number per cell 2.20.  Biopsy of the right breast lesion at the 9 o'clock position showed a sclerotic lesion with calcifications and negative for carcinoma.  The patient's subsequent history is as detailed below.   PAST MEDICAL HISTORY: Past Medical History:  Diagnosis Date  . Anemia   . Blood transfusion 2005  . Hernia   . HH (hiatus hernia)   . Hyperlipidemia   . Hypertension   . PAH (pulmonary artery hypertension) ([place])   . Sarcoidosis   . Shortness of breath    on exertion  . Sleep apnea    does not use cpap  . Wheezing  PAST SURGICAL HISTORY: Past Surgical History:  Procedure Laterality Date  . ABDOMINAL HYSTERECTOMY  1988  . BACK SURGERY  2005   lower  . BREAST LUMPECTOMY WITH RADIOACTIVE SEED AND SENTINEL LYMPH NODE BIOPSY Right 07/02/2018   Procedure: RIGHT BREAST RADIOACTIVE  SEED LUMPECTOMY X2 AND RIGHT AXILLARY  SENTINEL LYMPH NODE BIOPSY;  Surgeon: [name], MD;  Location: [facility];  Service: General;  Laterality: Right;  . ESOPHAGOGASTRODUODENOSCOPY  06/24/2012   Procedure: ESOPHAGOGASTRODUODENOSCOPY (EGD);  Surgeon: [name], MD;  Location: [name];  Service: General;  Laterality: N/A;  . HIATAL HERNIA REPAIR  08/18/2012   Procedure: LAPAROSCOPIC REPAIR OF HIATAL HERNIA;  Surgeon: [name], MD;  Location: [facility];  Service: General;  Laterality: N/A;  Laparoscopic Hiatal Hernia Repair   . INSERTION OF MESH  08/18/2012   Procedure: INSERTION OF MESH;  Surgeon: [name], MD;  Location: [facility];  Service: General;;  . IR IMAGING GUIDED PORT INSERTION  07/19/2018  . PORTACATH PLACEMENT N/A 07/02/2018   Procedure: INSERTION PORT-A-CATH TRIED NO PORT IN PLACE;  Surgeon: [name], MD;  Location: [facility];  Service: General;  Laterality: N/A;  . VESICOVAGINAL FISTULA CLOSURE W/ TAH      FAMILY HISTORY Family History  Problem Relation Age of Onset  . Clotting disorder Mother        PE  . Heart disease Father   . Cancer Maternal Grandmother        not sure type, but spread to the brain.  . Coronary artery disease Other   . Cancer Other        ? type   She notes that her father died from heart disease at age 48. Patients' mother lives in a nursing home and is 61 (as of November 2019)-- she has a form of dementia. The patient has 3 brothers and 0 sisters. A maternal aunt had a form of cancer, not clear what it was.  The maternal grandmother had breast cancer, but was never treated because of her old age.  The patient does not think anyone in her family had ovarian cancer, but there are 2 others in her family that had colon cancer.    GYNECOLOGIC HISTORY:  No LMP recorded. Patient has had a hysterectomy. 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 She lives alone. She does not have any pets.  Her daughter Rachel Kelly, 48, lives in [place] and works in Psychologist, educational. No grandchildren.  The patient belongs to [employer]: In place, but she can't remember who her power of attorney is (believes it is her daughter).   HEALTH MAINTENANCE: Social History   Tobacco Use  . Smoking status: Never Smoker  . Smokeless tobacco: Never Used  Substance Use Topics  . Alcohol use: No  . Drug use: No    Colonoscopy: Dr. Collene Mares  PAP: yes, but not sure when  Bone density: No   Allergies  Allergen Reactions  . Penicillins Rash    Fine red bumps that spread all over the body. Has patient had a PCN reaction causing immediate rash, facial/tongue/throat swelling, SOB or lightheadedness with hypotension: No Has patient had a PCN reaction causing severe rash involving mucus membranes or skin  necrosis: No Has patient had a PCN reaction that required hospitalization: No Has patient had a PCN reaction occurring within the last 10 years: No If all of the above answers are "NO", then may proceed with Cephalosporin use.   . Skin Adhesives [Cyanoacrylate] Itching and Rash    Dermabond    Current Outpatient Medications  Medication Sig Dispense Refill  . Ascorbic Acid (VITAMIN C) 1000 MG tablet Take 1,000 mg by mouth every other day.     . Cholecalciferol (VITAMIN D) 2000 UNITS CAPS Take 2,000 Units by mouth daily.     . Coenzyme Q10 200 MG TABS Take 200 mg by mouth daily.    . hydrocortisone 2.5 % cream Apply 1 application topically 2 (two) times daily as needed (dry skin).    Marland Kitchen ibuprofen (ADVIL,MOTRIN) 200 MG tablet Take 400 mg by mouth every 6 (six) hours as needed for headache or moderate pain.    Marland Kitchen ketotifen (ALAWAY) 0.025 % ophthalmic solution Place 1 drop into  both eyes 2 (two) times daily.    Marland Kitchen loperamide (IMODIUM A-D) 2 MG capsule Take 2 mg by mouth daily as needed for diarrhea or loose stools.    Marland Kitchen omeprazole (PRILOSEC) 20 MG capsule Take 20 mg by mouth daily.     . phenazopyridine (PYRIDIUM) 100 MG tablet Take 1 tablet (100 mg total) by mouth 3 (three) times daily as needed for pain. 10 tablet 0  . Polyvinyl Alcohol-Povidone PF (REFRESH) 1.4-0.6 % SOLN Place 1 drop into both eyes daily as needed (dry eyes).     . potassium gluconate 595 MG TABS Take 595 mg by mouth daily.     . simvastatin (ZOCOR) 20 MG tablet Take 20 mg by mouth every morning.     . sulfamethoxazole-trimethoprim (BACTRIM DS,SEPTRA DS) 800-160 MG tablet Take 1 tablet by mouth 2 (two) times daily. 14 tablet 0  . traMADol (ULTRAM) 50 MG tablet Take 1 tablet (50 mg total) by mouth every 6 (six) hours as needed. 15 tablet 1  . triamterene-hydrochlorothiazide (MAXZIDE-25) 37.5-25 MG per tablet Take 1 tablet by mouth every morning.      No current facility-administered medications for this visit.     OBJECTIVE: Middle-aged African-American woman in no acute distress  Vitals:   08/30/18 0848  BP: (!) 142/83  Pulse: 81  Resp: 18  Temp: 97.9 F (36.6 C)  SpO2: 99%     Body mass index is 34.42 kg/m.   Wt Readings from Last 3 Encounters:  08/30/18 170 lb 6.4 oz (77.3 kg)  08/16/18 165 lb 8 oz (75.1 kg)  08/02/18 171 lb (77.6 kg)  ECOG FS:1 - Symptomatic but completely ambulatory  Glabrous scalp with some hypopigmentation Sclerae unicteric, EOMs intact No cervical or supraclavicular adenopathy Lungs no rales or rhonchi Heart regular rate and rhythm Abd soft, nontender, positive bowel sounds MSK no focal spinal tenderness, no upper extremity lymphedema Neuro: nonfocal, well oriented, appropriate affect Breasts: Deferred    LAB RESULTS:  CMP     Component Value Date/Time   NA 138 08/16/2018 1155   K 3.7 08/16/2018 1155   CL 104 08/16/2018 1155   CO2 27 08/16/2018  1155   GLUCOSE 118 (H) 08/16/2018 1155   BUN 18 08/16/2018 1155   CREATININE 1.34 (H) 08/16/2018 1155   CALCIUM 8.8 (L) 08/16/2018 1155   PROT 6.9 08/16/2018 1155   ALBUMIN 3.7 08/16/2018 1155   AST 20 08/16/2018 1155   ALT 16 08/16/2018 1155   ALKPHOS  62 08/16/2018 1155   BILITOT 0.4 08/16/2018 1155   GFRNONAA 39 (L) 08/16/2018 1155   GFRAA 45 (L) 08/16/2018 1155    No results found for: TOTALPROTELP, ALBUMINELP, A1GS, A2GS, BETS, BETA2SER, GAMS, MSPIKE, SPEI  No results found for: KPAFRELGTCHN, LAMBDASER, [facility]  Lab Results  Component Value Date   WBC 5.9 08/30/2018   NEUTROABS 3.7 08/30/2018   HGB 10.5 (L) 08/30/2018   HCT 32.6 (L) 08/30/2018   MCV 93.4 08/30/2018   PLT 217 08/30/2018    @LASTCHEMISTRY @  No results found for: LABCA2  No components found for: ZESPQZ300  No results for input(s): INR in the last 168 hours.  No results found for: LABCA2  No results found for: TMA263  No results found for: FHL456  No results found for: YBW389  No results found for: CA2729  No components found for: HGQUANT  No results found for: CEA1 / No results found for: CEA1   No results found for: AFPTUMOR  No results found for: CHROMOGRNA  No results found for: PSA1  Appointment on 08/30/2018  Component Date Value Ref Range Status  . WBC 08/30/2018 5.9  4.0 - 10.5 K/uL Final  . RBC 08/30/2018 3.49* 3.87 - 5.11 MIL/uL Final  . Hemoglobin 08/30/2018 10.5* 12.0 - 15.0 g/dL Final  . HCT 08/30/2018 32.6* 36.0 - 46.0 % Final  . MCV 08/30/2018 93.4  80.0 - 100.0 fL Final  . MCH 08/30/2018 30.1  26.0 - 34.0 pg Final  . MCHC 08/30/2018 32.2  30.0 - 36.0 g/dL Final  . RDW 08/30/2018 14.0  11.5 - 15.5 % Final  . Platelets 08/30/2018 217  150 - 400 K/uL Final  . nRBC 08/30/2018 0.0  0.0 - 0.2 % Final  . Neutrophils Relative % 08/30/2018 62  % Final  . Neutro Abs 08/30/2018 3.7  1.7 - 7.7 K/uL Final  . Lymphocytes Relative 08/30/2018 26  % Final  . Lymphs Abs  08/30/2018 1.5  0.7 - 4.0 K/uL Final  . Monocytes Relative 08/30/2018 10  % Final  . Monocytes Absolute 08/30/2018 0.6  0.1 - 1.0 K/uL Final  . Eosinophils Relative 08/30/2018 1  % Final  . Eosinophils Absolute 08/30/2018 0.1  0.0 - 0.5 K/uL Final  . Basophils Relative 08/30/2018 0  % Final  . Basophils Absolute 08/30/2018 0.0  0.0 - 0.1 K/uL Final  . Immature Granulocytes 08/30/2018 1  % Final  . Abs Immature Granulocytes 08/30/2018 0.04  0.00 - 0.07 K/uL Final   Performed at [facility], [place] [address], [place], [place] [address]    (this displays the last labs from the last 3 days)  No results found for: TOTALPROTELP, ALBUMINELP, A1GS, A2GS, BETS, BETA2SER, GAMS, MSPIKE, SPEI (this displays SPEP labs)  No results found for: KPAFRELGTCHN, LAMBDASER, KAPLAMBRATIO (kappa/lambda light chains)  No results found for: HGBA, HGBA2QUANT, HGBFQUANT, HGBSQUAN (Hemoglobinopathy evaluation)   No results found for: LDH  No results found for: IRON, TIBC, IRONPCTSAT (Iron and TIBC)  No results found for: FERRITIN  Urinalysis    Component Value Date/Time   COLORURINE STRAW (A) 07/27/2018 1050   APPEARANCEUR CLEAR 07/27/2018 1050   LABSPEC 1.011 07/27/2018 1050   PHURINE 6.0 07/27/2018 1050   GLUCOSEU NEGATIVE 07/27/2018 1050   HGBUR SMALL (A) 07/27/2018 1050   BILIRUBINUR NEGATIVE 07/27/2018 1050   KETONESUR NEGATIVE 07/27/2018 1050   PROTEINUR NEGATIVE 07/27/2018 1050   UROBILINOGEN 1.0 11/21/2012 1402   NITRITE NEGATIVE 07/27/2018 1050   LEUKOCYTESUR MODERATE (  A) 07/27/2018 1050     STUDIES:  No results found.  ELIGIBLE FOR AVAILABLE RESEARCH PROTOCOL: PREVENT  ASSESSMENT: 73 y.o. [place] woman status post right breast upper outer quadrant biopsy 06/02/2018 for a clinical T1b N0, stage IB invasive ductal carcinoma, grade 2, triple negative, with an MIB-1 of 10%  (1) status post right lumpectomy and sentinel lymph node sampling 07/02/2018 for a  pT1b pN0,stage IA invasive ductal carcinoma, grade 2, now HER-2 positive, still estrogen and progesterone receptor negative, with an MIB-1 of 1%  (2) adjuvant chemotherapy consisting of paclitaxel x12 with trastuzumab given weekly started 07/27/2018  (a) paclitaxel discontinued after 3 doses because of neuropathy-- last dose 08/09/2018  (3) trastuzumab to continue to total 1 year (through December 2020)  (a) echocardiogram 07/22/2018 showed an ejection fraction in the 55-60% range.  (3) adjuvant radiation pending   PLAN: Larhonda is very philosophical about her hair loss.  The good news is that she already has a little bit of [place] and I think in 3 to 4 months she will have enough that she will not have to wear a hat.  She will receive a 2-week dose of trastuzumab today simply because she is already been scheduled to receive the next dose 2 weeks from now and we do not want to change all her appointments.  The plan basically is for her to receive trastuzumab to complete a year which will take [place] through December of this year.  I have encouraged her to continue her excellent walking program.  She is already scheduled to meet with Dr. Isidore Moos later this week.  I anticipate she will be done with radiation by April and I am going to see her November 15, 2018 in particular but of course she has other appointments in place as well.  She will be due for her next echocardiogram in March.  She knows to call for any other issues that may develop before the next visit.    08/30/18 9:16 AM Chauncey Cruel, MD Medical Oncology and Hematology [facility] [place] [address], [place] [address] Tel. [phone]    Fax. [phone]  I, Wilburn Mylar, am acting as scribe for Dr. [name]. [name].  I, Lurline Del MD, have reviewed the above documentation for accuracy and completeness, and I agree with the above.

## 2018-08-30 NOTE — Patient Instructions (Signed)
[facility] Discharge Instructions for Patients Receiving Chemotherapy Today you received the following chemotherapy agents:  Herceptin To help prevent nausea and vomiting after your treatment, we encourage you to take your nausea medication as prescribed.   If you develop nausea and vomiting that is not controlled by your nausea medication, call the clinic.   BELOW ARE SYMPTOMS THAT SHOULD BE REPORTED IMMEDIATELY:  *FEVER GREATER THAN 100.5 F  *CHILLS WITH OR WITHOUT FEVER  NAUSEA AND VOMITING THAT IS NOT CONTROLLED WITH YOUR NAUSEA MEDICATION  *UNUSUAL SHORTNESS OF BREATH  *UNUSUAL BRUISING OR BLEEDING  TENDERNESS IN MOUTH AND THROAT WITH OR WITHOUT PRESENCE OF ULCERS  *URINARY PROBLEMS  *BOWEL PROBLEMS  UNUSUAL RASH Items with * indicate a potential emergency and should be followed up as soon as possible.  Feel free to call the clinic should you have any questions or concerns. The clinic phone number is [phone].  Please show the CHEMO ALERT CARD at check-in to the Emergency Department and triage nurse.   

## 2018-08-30 NOTE — Telephone Encounter (Signed)
Gave avs and calendar ° °

## 2018-09-01 ENCOUNTER — Encounter: Payer: Self-pay | Admitting: Radiation Oncology

## 2018-09-01 ENCOUNTER — Ambulatory Visit
Admission: RE | Admit: 2018-09-01 | Discharge: 2018-09-01 | Disposition: A | Discharge status: Home / Self Care | Payer: Medicare Other | Source: Ambulatory Visit | Attending: Radiation Oncology | Admitting: Radiation Oncology

## 2018-09-01 ENCOUNTER — Other Ambulatory Visit: Payer: Self-pay

## 2018-09-01 DIAGNOSIS — Z171 Estrogen receptor negative status [ER-]: Secondary | ICD-10-CM | POA: Insufficient documentation

## 2018-09-01 DIAGNOSIS — C50411 Malignant neoplasm of upper-outer quadrant of right female breast: Secondary | ICD-10-CM

## 2018-09-01 DIAGNOSIS — Z79899 Other long term (current) drug therapy: Secondary | ICD-10-CM | POA: Insufficient documentation

## 2018-09-01 NOTE — Progress Notes (Signed)
Radiation Oncology         [phone] ________________________________  Name: Rachel Kelly MRN: [id number]  Date: 09/01/2018  DOB: 12/01/1945  Follow-Up Visit Note  Outpatient  CC: [name], MD  [name], [name], MD  Diagnosis:      ICD-10-CM   1. Malignant neoplasm of upper-outer quadrant of right breast in female, estrogen receptor negative ([place]) C50.411    Z17.1   Cancer Staging Malignant neoplasm of upper-outer quadrant of right breast in female, estrogen receptor negative ([place]) Staging form: Breast, AJCC 8th Edition - Clinical stage from 06/09/2018: Stage IB (cT1b, cN0, cM0, G2, ER-, PR-, HER2-) - Unsigned - Pathologic: Stage IA (pT1b, pN0, cM0, G2, ER-, PR-, HER2+) - Signed by Eppie Gibson, MD on 09/01/2018   CHIEF COMPLAINT: Here to discuss management of Right breast cancer  Narrative:  The patient returns today for follow-up.     Since consultation date, she underwent lumpectomy and sentinel lymph node biopsy on 07/02/2018 by Dr. [name].  This revealed an 8 mm tumor, invasive ductal carcinoma, grade 2, ER and PR negative, HER-2 positive.  The margins were negative.  The 2 sentinel lymph nodes were negative.  She then proceeded with chemotherapy.  She received adjuvant Taxol and trastuzumab.  The paclitaxel was stopped after 3 cycles due to neuropathy.  She completed this paclitaxel on 08/09/2018.  She has been referred back for radiotherapy.  Symptomatically, the patient reports: Peripheral neuropathy and alopecia  She is making an effort to walk at least a mile a day  Plans to continue trastuzumab alone        ALLERGIES:  is allergic to penicillins and skin adhesives [cyanoacrylate].  Meds: Current Outpatient Medications  Medication Sig Dispense Refill  . Ascorbic Acid (VITAMIN C) 1000 MG tablet Take 1,000 mg by mouth every other day.     . Cholecalciferol (VITAMIN D) 2000 UNITS CAPS Take 2,000 Units by mouth daily.     . Coenzyme Q10 200 MG TABS Take  200 mg by mouth daily.    . hydrocortisone 2.5 % cream Apply 1 application topically 2 (two) times daily as needed (dry skin).    Marland Kitchen ibuprofen (ADVIL,MOTRIN) 200 MG tablet Take 400 mg by mouth every 6 (six) hours as needed for headache or moderate pain.    Marland Kitchen ketotifen (ALAWAY) 0.025 % ophthalmic solution Place 1 drop into both eyes 2 (two) times daily.    Marland Kitchen loperamide (IMODIUM A-D) 2 MG capsule Take 2 mg by mouth daily as needed for diarrhea or loose stools.    Marland Kitchen omeprazole (PRILOSEC) 20 MG capsule Take 20 mg by mouth daily.     . Polyvinyl Alcohol-Povidone PF (REFRESH) 1.4-0.6 % SOLN Place 1 drop into both eyes daily as needed (dry eyes).     . potassium gluconate 595 MG TABS Take 595 mg by mouth daily.     . simvastatin (ZOCOR) 20 MG tablet Take 20 mg by mouth every morning.     . triamterene-hydrochlorothiazide (MAXZIDE-25) 37.5-25 MG per tablet Take 1 tablet by mouth every morning.     . phenazopyridine (PYRIDIUM) 100 MG tablet Take 1 tablet (100 mg total) by mouth 3 (three) times daily as needed for pain. (Patient not taking: Reported on 09/01/2018) 10 tablet 0  . sulfamethoxazole-trimethoprim (BACTRIM DS,SEPTRA DS) 800-160 MG tablet Take 1 tablet by mouth 2 (two) times daily. (Patient not taking: Reported on 09/01/2018) 14 tablet 0  . traMADol (ULTRAM) 50 MG tablet Take 1 tablet (50 mg  total) by mouth every 6 (six) hours as needed. (Patient not taking: Reported on 09/01/2018) 15 tablet 1   No current facility-administered medications for this encounter.     Physical Findings:  height is _0  (1.499 m) and weight is 172 lb 3.2 oz (78.1 kg). Her oral temperature is 98.2 F (36.8 C). Her blood pressure is 129/86 and her pulse is 72. Her respiration is 20 and oxygen saturation is 99%. .     General: Alert and oriented, in no acute distress Psychiatric: Judgment and insight are intact. Affect is appropriate. Breast exam reveals well-healed lumpectomy and axillary scars, right breast  Lab  Findings: Lab Results  Component Value Date   WBC 5.9 08/30/2018   HGB 10.5 (L) 08/30/2018   HCT 32.6 (L) 08/30/2018   MCV 93.4 08/30/2018   PLT 217 08/30/2018    Radiographic Findings: No results found.  Impression/Plan: We discussed adjuvant radiotherapy today.  I recommend radiotherapy to the right breast in order to reduce risk of local regional recurrence by two thirds.  The risks, benefits and side effects of this treatment were discussed in detail.  She understands that radiotherapy is associated with skin irritation and fatigue in the acute setting. Late effects can include cosmetic changes and rare injury to internal organs.   She is enthusiastic about proceeding with treatment. A consent form has been signed and placed in her chart.  A total of 3 medically necessary complex treatment devices will be fabricated and supervised by me: 2 fields with MLCs for custom blocks to protect heart, and lungs;  and, a Vac-lok. MORE COMPLEX DEVICES MAY BE MADE IN DOSIMETRY FOR FIELD IN FIELD BEAMS FOR DOSE HOMOGENEITY.  I have requested : 3D Simulation which is medically necessary to give adequate dose to at risk tissues while sparing lungs and heart.  I have requested a DVH of the following structures: lungs, heart, right lumpectomy cavity.    The patient will receive 40.05 Gy in 15 fractions to the right breast with 2 fields.  This will be followed by a boost.  Simulation is next week.  In a face to face visit lasting at least 15 minutes, I spent over 50% of the time on counseling and coordination of care  _____________________________________   Eppie Gibson, MD

## 2018-09-06 ENCOUNTER — Ambulatory Visit: Payer: Medicare Other | Admitting: Radiation Oncology

## 2018-09-10 ENCOUNTER — Encounter: Payer: Self-pay | Admitting: Radiation Oncology

## 2018-09-10 ENCOUNTER — Ambulatory Visit
Admission: RE | Admit: 2018-09-10 | Discharge: 2018-09-10 | Disposition: A | Discharge status: Home / Self Care | Payer: Medicare Other | Source: Ambulatory Visit | Attending: Radiation Oncology | Admitting: Radiation Oncology

## 2018-09-10 DIAGNOSIS — Z171 Estrogen receptor negative status [ER-]: Secondary | ICD-10-CM | POA: Insufficient documentation

## 2018-09-10 DIAGNOSIS — Z510 Encounter for antineoplastic radiation therapy: Secondary | ICD-10-CM | POA: Insufficient documentation

## 2018-09-10 DIAGNOSIS — C50411 Malignant neoplasm of upper-outer quadrant of right female breast: Secondary | ICD-10-CM | POA: Diagnosis not present

## 2018-09-10 NOTE — Progress Notes (Signed)
  Radiation Oncology         [phone]) [phone] ________________________________  Name: Rachel Kelly MRN: [id number]  Date: 09/10/2018  DOB: 03/07/1946  SIMULATION AND TREATMENT PLANNING NOTE    Outpatient  DIAGNOSIS:     ICD-10-CM   1. Malignant neoplasm of upper-outer quadrant of right breast in female, estrogen receptor negative ([place]) C50.411    Z17.1     NARRATIVE:  The patient was brought to the [facility].  Identity was confirmed.  All relevant records and images related to the planned course of therapy were reviewed.  The patient freely provided informed written consent to proceed with treatment after reviewing the details related to the planned course of therapy. The consent form was witnessed and verified by the simulation staff.    Then, the patient was set-up in a stable reproducible supine position for radiation therapy with her ipsilateral arm over her head, and her upper body secured in a custom-made Vac-lok device.  CT images were obtained.  Surface markings were placed.  The CT images were loaded into the planning software.    TREATMENT PLANNING NOTE: Treatment planning then occurred.  The radiation prescription was entered and confirmed.     A total of 3 medically necessary complex treatment devices were fabricated and supervised by me: 2 fields with MLCs for custom blocks to protect heart, and lungs;  and, a Vac-lok. MORE COMPLEX DEVICES MAY BE MADE IN DOSIMETRY FOR FIELD IN FIELD BEAMS FOR DOSE HOMOGENEITY.  I have requested : 3D Simulation which is medically necessary to give adequate dose to at risk tissues while sparing lungs and heart.  I have requested a DVH of the following structures: lungs, heart, right lumpectomy cavity.    The patient will receive 40.05 Gy in 15 fractions to the right breast with 2 tangential fields.  This will be followed by a boost.  Optical Surface Tracking Plan:  Since intensity modulated radiotherapy (IMRT) and 3D conformal  radiation treatment methods are predicated on accurate and precise positioning for treatment, intrafraction motion monitoring is medically necessary to ensure accurate and safe treatment delivery. The ability to quantify intrafraction motion without excessive ionizing radiation dose can only be performed with optical surface tracking. Accordingly, surface imaging offers the opportunity to obtain 3D measurements of patient position throughout IMRT and 3D treatments without excessive radiation exposure. I am ordering optical surface tracking for this patient's upcoming course of radiotherapy.  ________________________________   Reference:  Ursula Alert, J, et al. Surface imaging-based analysis of intrafraction motion for breast radiotherapy patients.Journal of [place], n. 6, nov. 2014. ISSN [id number].  Available at: <http://www.jacmp.org/index.php/jacmp/article/view/4957>.    -----------------------------------  Eppie Gibson, MD

## 2018-09-13 ENCOUNTER — Inpatient Hospital Stay: Payer: Medicare Other

## 2018-09-13 ENCOUNTER — Inpatient Hospital Stay (HOSPITAL_BASED_OUTPATIENT_CLINIC_OR_DEPARTMENT_OTHER): Payer: Medicare Other | Admitting: Adult Health

## 2018-09-13 ENCOUNTER — Ambulatory Visit: Payer: Medicare Other | Admitting: Radiation Oncology

## 2018-09-13 ENCOUNTER — Encounter: Payer: Self-pay | Admitting: Adult Health

## 2018-09-13 VITALS — BP 135/85 | HR 71 | Temp 97.8°F | Resp 18 | Ht 59.0 in | Wt 168.1 lb

## 2018-09-13 DIAGNOSIS — G629 Polyneuropathy, unspecified: Secondary | ICD-10-CM | POA: Diagnosis not present

## 2018-09-13 DIAGNOSIS — Z90710 Acquired absence of both cervix and uterus: Secondary | ICD-10-CM

## 2018-09-13 DIAGNOSIS — I10 Essential (primary) hypertension: Secondary | ICD-10-CM

## 2018-09-13 DIAGNOSIS — C50411 Malignant neoplasm of upper-outer quadrant of right female breast: Secondary | ICD-10-CM

## 2018-09-13 DIAGNOSIS — Z95828 Presence of other vascular implants and grafts: Secondary | ICD-10-CM

## 2018-09-13 DIAGNOSIS — Z171 Estrogen receptor negative status [ER-]: Principal | ICD-10-CM

## 2018-09-13 DIAGNOSIS — Z5112 Encounter for antineoplastic immunotherapy: Secondary | ICD-10-CM | POA: Diagnosis not present

## 2018-09-13 DIAGNOSIS — N63 Unspecified lump in unspecified breast: Secondary | ICD-10-CM

## 2018-09-13 DIAGNOSIS — Z79899 Other long term (current) drug therapy: Secondary | ICD-10-CM

## 2018-09-13 DIAGNOSIS — Z791 Long term (current) use of non-steroidal anti-inflammatories (NSAID): Secondary | ICD-10-CM

## 2018-09-13 DIAGNOSIS — Z510 Encounter for antineoplastic radiation therapy: Secondary | ICD-10-CM | POA: Diagnosis not present

## 2018-09-13 LAB — COMPREHENSIVE METABOLIC PANEL
ALBUMIN: 3.8 g/dL (ref 3.5–5.0)
ALT: 16 U/L (ref 0–44)
AST: 26 U/L (ref 15–41)
Alkaline Phosphatase: 68 U/L (ref 38–126)
Anion gap: 9 (ref 5–15)
BUN: 16 mg/dL (ref 8–23)
CO2: 26 mmol/L (ref 22–32)
CREATININE: 1.02 mg/dL — AB (ref 0.44–1.00)
Calcium: 9.2 mg/dL (ref 8.9–10.3)
Chloride: 105 mmol/L (ref 98–111)
GFR calc Af Amer: >60 mL/min (ref >60)
GFR, EST NON AFRICAN AMERICAN: 54 mL/min — AB (ref >60)
Glucose, Bld: 104 mg/dL — ABNORMAL HIGH (ref 70–99)
Potassium: 3.6 mmol/L (ref 3.5–5.1)
Sodium: 140 mmol/L (ref 135–145)
Total Bilirubin: 0.6 mg/dL (ref 0.3–1.2)
Total Protein: 7 g/dL (ref 6.5–8.1)

## 2018-09-13 LAB — CBC WITH DIFFERENTIAL/PLATELET
Abs Immature Granulocytes: 0.01 10*3/uL (ref 0.00–0.07)
Basophils Absolute: 0 10*3/uL (ref 0.0–0.1)
Basophils Relative: 0 %
EOS PCT: 2 %
Eosinophils Absolute: 0.1 10*3/uL (ref 0.0–0.5)
HCT: 34.3 % — ABNORMAL LOW (ref 36.0–46.0)
Hemoglobin: 11.2 g/dL — ABNORMAL LOW (ref 12.0–15.0)
Immature Granulocytes: 0 %
Lymphocytes Relative: 36 %
Lymphs Abs: 1.9 10*3/uL (ref 0.7–4.0)
MCH: 30.4 pg (ref 26.0–34.0)
MCHC: 32.7 g/dL (ref 30.0–36.0)
MCV: 93 fL (ref 80.0–100.0)
Monocytes Absolute: 0.5 10*3/uL (ref 0.1–1.0)
Monocytes Relative: 10 %
Neutro Abs: 2.6 10*3/uL (ref 1.7–7.7)
Neutrophils Relative %: 52 %
Platelets: 186 10*3/uL (ref 150–400)
RBC: 3.69 MIL/uL — ABNORMAL LOW (ref 3.87–5.11)
RDW: 14 % (ref 11.5–15.5)
WBC: 5.1 10*3/uL (ref 4.0–10.5)
nRBC: 0 % (ref 0.0–0.2)

## 2018-09-13 MED ORDER — SODIUM CHLORIDE 0.9 % IV SOLN
Freq: Once | INTRAVENOUS | Status: AC
  Administered 2018-09-13: 14:00:00 via INTRAVENOUS
  Filled 2018-09-13: qty 250

## 2018-09-13 MED ORDER — DIPHENHYDRAMINE HCL 25 MG PO CAPS
25.0000 mg | ORAL_CAPSULE | Freq: Once | ORAL | Status: AC
  Administered 2018-09-13: 25 mg via ORAL

## 2018-09-13 MED ORDER — SODIUM CHLORIDE 0.9% FLUSH
10.0000 mL | INTRAVENOUS | Status: DC | PRN
  Administered 2018-09-13: 10 mL
  Filled 2018-09-13: qty 10

## 2018-09-13 MED ORDER — TRASTUZUMAB CHEMO 150 MG IV SOLR
450.0000 mg | Freq: Once | INTRAVENOUS | Status: AC
  Administered 2018-09-13: 450 mg via INTRAVENOUS
  Filled 2018-09-13: qty 21.43

## 2018-09-13 MED ORDER — ACETAMINOPHEN 325 MG PO TABS
ORAL_TABLET | ORAL | Status: AC
  Filled 2018-09-13: qty 2

## 2018-09-13 MED ORDER — SODIUM CHLORIDE 0.9% FLUSH
10.0000 mL | Freq: Once | INTRAVENOUS | Status: AC
  Administered 2018-09-13: 10 mL
  Filled 2018-09-13: qty 10

## 2018-09-13 MED ORDER — DIPHENHYDRAMINE HCL 25 MG PO CAPS
ORAL_CAPSULE | ORAL | Status: AC
  Filled 2018-09-13: qty 1

## 2018-09-13 MED ORDER — HEPARIN SOD (PORK) LOCK FLUSH 100 UNIT/ML IV SOLN
500.0000 [IU] | Freq: Once | INTRAVENOUS | Status: AC | PRN
  Administered 2018-09-13: 500 [IU]
  Filled 2018-09-13: qty 5

## 2018-09-13 MED ORDER — ACETAMINOPHEN 325 MG PO TABS
650.0000 mg | ORAL_TABLET | Freq: Once | ORAL | Status: AC
  Administered 2018-09-13: 650 mg via ORAL

## 2018-09-13 NOTE — Progress Notes (Signed)
[facility]  [phone] Fax:([phone]     ID: Rachel Kelly DOB: 29-Sep-1945  MR#: [id number]  YKZ#:[id number]  Patient Care Team: [name], MD as PCP - General (Family Medicine) Deneise Lever, MD as Referring Physician (Pulmonary Disease) [name], MD as Consulting Physician (General Surgery) [name], [name], MD as Consulting Physician (Oncology) Eppie Gibson, MD as Attending Physician (Radiation Oncology) Chelsea Aus, DDS as Consulting Physician (Dentistry) Harriett Sine, MD as Consulting Physician (Dermatology) Bensimhon, Shaune Pascal, MD as Consulting Physician (Cardiology) OTHER MD:  CHIEF COMPLAINT: HER-2 positive, estrogen receptor negative breast cancer  CURRENT TREATMENT: anti-HER-2 immunotherapy, adjuvant radiation  INTERVAL HISTORY: Rachel Kelly returns today for follow-up and treatment of her HER-2 positive breast cancer. She is accompanied by daughter today.    She was able to receive only 74 of the 12 planned doses of weekly paclitaxel because of the early development of peripheral neuropathy.  She is now receiving trastuzumab every 3 weeks, which will be continued to complete a year. She will receive this again today.  She is due to start radiation on Monday.    Her most recent echocardiogram was on 07/22/2018 with EF of 55 - 60%.  She will be due for repeat in March.   REVIEW OF SYSTEMS: Rachel Kelly continues to have mild neuropathy in her toes, but it is improved from prior.  She has continued being active and has noted a skin change on her hands bilaterally.  She says her skin has become darker and more "leather like" in its appearance.  She is applying lotion regularly.    She would like a referral to PT regarding her right breast and a concern of it being swollen.  She also thinks her arm might be slightly swollen and she wants this evaluated.    Otherwise Rachel Kelly is doing well.  She hasn't had any fevers, chills, chest pain,  palpitations, cough, shortness of breath.  She is without nausea, vomiting, dysphagia, indigestion, bowel/bladder changes.  A detailed ROS was otherwise non contributory.     HISTORY OF CURRENT ILLNESS: From the original intake note:  MCKENLEIGH TARLTON had routine screening mammography on 05/26/2018 showing a possible abnormality in the right breast. She underwent bilateral diagnostic mammography with tomography and right breast ultrasonography at [facility] on 06/02/2018 showing: breast density category C, suspicious mass at the 10 o'clock position 8 cm from the nipple. It measures 10 x 7 x 6 mm. Just adjacent to this mass, there is an irregular hypoechoic mass with punctate echogenic foci. It measures 9 x 8 x 3 mm. Additional indeterminate mass at the 9 o'clock position 6 cm from the nipple. It measures 6 x 4 x 4 mm. No suspicious right axillary lymphadenopathy.  Accordingly on 06/02/2018 she proceeded to biopsy of two of the right breast areas in question. The pathology from this procedure showed [phone]): at the 10 o'clock position, invasive ductal carcinoma, grade 2, estrogen and progesterone receptor negative, HER-2 equivocal by immunohistochemistry at 2+, but negative by [facility] with a signals ratio of 1.47 and number per cell 2.20.  Biopsy of the right breast lesion at the 9 o'clock position showed a sclerotic lesion with calcifications and negative for carcinoma.  The patient's subsequent history is as detailed below.   PAST MEDICAL HISTORY: Past Medical History:  Diagnosis Date  . Anemia   . Blood transfusion 2005  . Hernia   . HH (hiatus hernia)   . Hyperlipidemia   . Hypertension   .  PAH (pulmonary artery hypertension) ([place])   . Sarcoidosis   . Shortness of breath    on exertion  . Sleep apnea    does not use cpap  . Wheezing     PAST SURGICAL HISTORY: Past Surgical History:  Procedure Laterality Date  . ABDOMINAL HYSTERECTOMY  1988  . BACK SURGERY  2005   lower   . BREAST LUMPECTOMY WITH RADIOACTIVE SEED AND SENTINEL LYMPH NODE BIOPSY Right 07/02/2018   Procedure: RIGHT BREAST RADIOACTIVE SEED LUMPECTOMY X2 AND RIGHT AXILLARY  SENTINEL LYMPH NODE BIOPSY;  Surgeon: [name], MD;  Location: [facility];  Service: General;  Laterality: Right;  . ESOPHAGOGASTRODUODENOSCOPY  06/24/2012   Procedure: ESOPHAGOGASTRODUODENOSCOPY (EGD);  Surgeon: [name], MD;  Location: [name];  Service: General;  Laterality: N/A;  . HIATAL HERNIA REPAIR  08/18/2012   Procedure: LAPAROSCOPIC REPAIR OF HIATAL HERNIA;  Surgeon: [name], MD;  Location: [facility];  Service: General;  Laterality: N/A;  Laparoscopic Hiatal Hernia Repair   . INSERTION OF MESH  08/18/2012   Procedure: INSERTION OF MESH;  Surgeon: [name], MD;  Location: [facility];  Service: General;;  . IR IMAGING GUIDED PORT INSERTION  07/19/2018  . PORTACATH PLACEMENT N/A 07/02/2018   Procedure: INSERTION PORT-A-CATH TRIED NO PORT IN PLACE;  Surgeon: [name], MD;  Location: [facility];  Service: General;  Laterality: N/A;  . VESICOVAGINAL FISTULA CLOSURE W/ TAH      FAMILY HISTORY Family History  Problem Relation Age of Onset  . Clotting disorder Mother        PE  . Heart disease Father   . Cancer Maternal Grandmother        not sure type, but spread to the brain.  . Coronary artery disease Other   . Cancer Other        ? type   She notes that her father died from heart disease at age 74. Patients' mother lives in a nursing home and is 74 (as of November 2019)-- she has a form of dementia. The patient has 3 brothers and 0 sisters. A maternal aunt had a form of cancer, not clear what it was.  The maternal grandmother had breast cancer, but was never treated because of her old age.  The patient does not think anyone in her family had ovarian cancer, but there are 2 others in her family that had colon cancer.    GYNECOLOGIC HISTORY:  No LMP recorded. Patient has had a  hysterectomy. 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 She lives alone. She does not have any pets.  Her daughter Deatra Canter "Cantrece" Grandville Silos, 48, lives in [place] and works in Psychologist, educational. No grandchildren.  The patient belongs to [place]: In place, but she can't remember who her power of attorney is (believes it is her daughter).   HEALTH MAINTENANCE: Social History   Tobacco Use  . Smoking status: Never Smoker  . Smokeless tobacco: Never Used  Substance Use Topics  . Alcohol use: No  . Drug use: No    Colonoscopy: Dr. Collene Mares  PAP: yes, but not sure when  Bone density: No   Allergies  Allergen Reactions  . Penicillins Rash    Fine red  bumps that spread all over the body. Has patient had a PCN reaction causing immediate rash, facial/tongue/throat swelling, SOB or lightheadedness with hypotension: No Has patient had a PCN reaction causing severe rash involving mucus membranes or skin necrosis: No Has patient had a PCN reaction that required hospitalization: No Has patient had a PCN reaction occurring within the last 10 years: No If all of the above answers are "NO", then may proceed with Cephalosporin use.   . Skin Adhesives [Cyanoacrylate] Itching and Rash    Dermabond    Current Outpatient Medications  Medication Sig Dispense Refill  . Ascorbic Acid (VITAMIN C) 1000 MG tablet Take 1,000 mg by mouth every other day.     . Cholecalciferol (VITAMIN D) 2000 UNITS CAPS Take 2,000 Units by mouth daily.     . Coenzyme Q10 200 MG TABS Take 200 mg by mouth daily.    . hydrocortisone 2.5 % cream Apply 1 application topically 2 (two) times daily as needed (dry skin).    Marland Kitchen ibuprofen (ADVIL,MOTRIN) 200 MG tablet Take 400 mg by mouth every  6 (six) hours as needed for headache or moderate pain.    Marland Kitchen ketotifen (ALAWAY) 0.025 % ophthalmic solution Place 1 drop into both eyes 2 (two) times daily.    Marland Kitchen loperamide (IMODIUM A-D) 2 MG capsule Take 2 mg by mouth daily as needed for diarrhea or loose stools.    Marland Kitchen omeprazole (PRILOSEC) 20 MG capsule Take 20 mg by mouth daily.     . phenazopyridine (PYRIDIUM) 100 MG tablet Take 1 tablet (100 mg total) by mouth 3 (three) times daily as needed for pain. 10 tablet 0  . Polyvinyl Alcohol-Povidone PF (REFRESH) 1.4-0.6 % SOLN Place 1 drop into both eyes daily as needed (dry eyes).     . potassium gluconate 595 MG TABS Take 595 mg by mouth daily.     . simvastatin (ZOCOR) 20 MG tablet Take 20 mg by mouth every morning.     . sulfamethoxazole-trimethoprim (BACTRIM DS,SEPTRA DS) 800-160 MG tablet Take 1 tablet by mouth 2 (two) times daily. 14 tablet 0  . traMADol (ULTRAM) 50 MG tablet Take 1 tablet (50 mg total) by mouth every 6 (six) hours as needed. 15 tablet 1  . triamterene-hydrochlorothiazide (MAXZIDE-25) 37.5-25 MG per tablet Take 1 tablet by mouth every morning.      No current facility-administered medications for this visit.     OBJECTIVE: Middle-aged African-American woman in no acute distress  Vitals:   09/13/18 1250  BP: 135/85  Pulse: 71  Resp: 18  Temp: 97.8 F (36.6 C)  SpO2: 100%     Body mass index is 33.95 kg/m.   Wt Readings from Last 3 Encounters:  09/13/18 168 lb 1.6 oz (76.2 kg)  09/01/18 172 lb 3.2 oz (78.1 kg)  08/30/18 170 lb 6.4 oz (77.3 kg)  ECOG FS:1 - Symptomatic but completely ambulatory  GENERAL: Patient is a well appearing female in no acute distress HEENT:  Sclerae anicteric.  Oropharynx clear and moist. No ulcerations or evidence of oropharyngeal candidiasis. Neck is supple.  NODES:  No cervical, supraclavicular, or axillary lymphadenopathy palpated.  BREAST EXAM: right breast s/Kelly lumpectomy, no sign of local recurrence, left breast benign LUNGS:   Clear to auscultation bilaterally.  No wheezes or rhonchi. HEART:  Regular rate and rhythm. No murmur appreciated. ABDOMEN:  Soft, nontender.  Positive, normoactive bowel sounds. No organomegaly palpated. MSK:  No focal spinal tenderness to palpation. Full range of motion bilaterally  in the upper extremities. EXTREMITIES:  No peripheral edema.   SKIN:  Clear with no obvious rashes or skin changes. No nail dyscrasia. NEURO:  Nonfocal. Well oriented.  Appropriate affect.      LAB RESULTS:  CMP     Component Value Date/Time   NA 141 08/30/2018 0824   K 4.0 08/30/2018 0824   CL 109 08/30/2018 0824   CO2 25 08/30/2018 0824   GLUCOSE 103 (H) 08/30/2018 0824   BUN 16 08/30/2018 0824   CREATININE 1.05 (H) 08/30/2018 0824   CREATININE 1.34 (H) 08/16/2018 1155   CALCIUM 8.9 08/30/2018 0824   PROT 6.5 08/30/2018 0824   ALBUMIN 3.5 08/30/2018 0824   AST 22 08/30/2018 0824   AST 20 08/16/2018 1155   ALT 16 08/30/2018 0824   ALT 16 08/16/2018 1155   ALKPHOS 60 08/30/2018 0824   BILITOT 0.5 08/30/2018 0824   BILITOT 0.4 08/16/2018 1155   GFRNONAA 53 (L) 08/30/2018 0824   GFRNONAA 39 (L) 08/16/2018 1155   GFRAA >60 08/30/2018 0824   GFRAA 45 (L) 08/16/2018 1155    No results found for: TOTALPROTELP, ALBUMINELP, A1GS, A2GS, BETS, BETA2SER, GAMS, MSPIKE, SPEI  No results found for: KPAFRELGTCHN, LAMBDASER, KAPLAMBRATIO  Lab Results  Component Value Date   WBC 5.1 09/13/2018   NEUTROABS 2.6 09/13/2018   HGB 11.2 (L) 09/13/2018   HCT 34.3 (L) 09/13/2018   MCV 93.0 09/13/2018   PLT 186 09/13/2018    @LASTCHEMISTRY @  No results found for: LABCA2  No components found for: WUXLKG401  No results for input(s): INR in the last 168 hours.  No results found for: LABCA2  No results found for: UUV253  No results found for: GUY403  No results found for: KVQ259  No results found for: CA2729  No components found for: HGQUANT  No results found for: CEA1 / No results found for:  CEA1   No results found for: AFPTUMOR  No results found for: CHROMOGRNA  No results found for: PSA1  Appointment on 09/13/2018  Component Date Value Ref Range Status  . WBC 09/13/2018 5.1  4.0 - 10.5 K/uL Final  . RBC 09/13/2018 3.69* 3.87 - 5.11 MIL/uL Final  . Hemoglobin 09/13/2018 11.2* 12.0 - 15.0 g/dL Final  . HCT 09/13/2018 34.3* 36.0 - 46.0 % Final  . MCV 09/13/2018 93.0  80.0 - 100.0 fL Final  . MCH 09/13/2018 30.4  26.0 - 34.0 pg Final  . MCHC 09/13/2018 32.7  30.0 - 36.0 g/dL Final  . RDW 09/13/2018 14.0  11.5 - 15.5 % Final  . Platelets 09/13/2018 186  150 - 400 K/uL Final  . nRBC 09/13/2018 0.0  0.0 - 0.2 % Final  . Neutrophils Relative % 09/13/2018 52  % Final  . Neutro Abs 09/13/2018 2.6  1.7 - 7.7 K/uL Final  . Lymphocytes Relative 09/13/2018 36  % Final  . Lymphs Abs 09/13/2018 1.9  0.7 - 4.0 K/uL Final  . Monocytes Relative 09/13/2018 10  % Final  . Monocytes Absolute 09/13/2018 0.5  0.1 - 1.0 K/uL Final  . Eosinophils Relative 09/13/2018 2  % Final  . Eosinophils Absolute 09/13/2018 0.1  0.0 - 0.5 K/uL Final  . Basophils Relative 09/13/2018 0  % Final  . Basophils Absolute 09/13/2018 0.0  0.0 - 0.1 K/uL Final  . Immature Granulocytes 09/13/2018 0  % Final  . Abs Immature Granulocytes 09/13/2018 0.01  0.00 - 0.07 K/uL Final   Performed at [facility], [address] [place], [place] [address]    (this displays the last labs from the last 3 days)  No results found for: TOTALPROTELP, ALBUMINELP, A1GS, A2GS, BETS, BETA2SER, GAMS, MSPIKE, SPEI (this displays SPEP labs)  No results found for: KPAFRELGTCHN, LAMBDASER, KAPLAMBRATIO (kappa/lambda light chains)  No results found for: HGBA, HGBA2QUANT, HGBFQUANT, HGBSQUAN (Hemoglobinopathy evaluation)   No results found for: LDH  No results found for: IRON, TIBC, IRONPCTSAT (Iron and TIBC)  No results found for: FERRITIN  Urinalysis    Component Value Date/Time   COLORURINE  STRAW (A) 07/27/2018 1050   APPEARANCEUR CLEAR 07/27/2018 1050   LABSPEC 1.011 07/27/2018 1050   PHURINE 6.0 07/27/2018 1050   GLUCOSEU NEGATIVE 07/27/2018 1050   HGBUR SMALL (A) 07/27/2018 1050   BILIRUBINUR NEGATIVE 07/27/2018 1050   KETONESUR NEGATIVE 07/27/2018 1050   PROTEINUR NEGATIVE 07/27/2018 1050   UROBILINOGEN 1.0 11/21/2012 1402   NITRITE NEGATIVE 07/27/2018 1050   LEUKOCYTESUR MODERATE (A) 07/27/2018 1050     STUDIES:  No results found.  ELIGIBLE FOR AVAILABLE RESEARCH PROTOCOL: PREVENT  ASSESSMENT: 73 y.o. [place] woman status post right breast upper outer quadrant biopsy 06/02/2018 for a clinical T1b N0, stage IB invasive ductal carcinoma, grade 2, triple negative, with an MIB-1 of 10%  (1) status post right lumpectomy and sentinel lymph node sampling 07/02/2018 for a pT1b pN0,stage IA invasive ductal carcinoma, grade 2, now HER-2 positive, still estrogen and progesterone receptor negative, with an MIB-1 of 1%  (2) adjuvant chemotherapy consisting of paclitaxel x12 with trastuzumab given weekly started 07/27/2018  (a) paclitaxel discontinued after 3 doses because of neuropathy-- last dose 08/09/2018  (3) trastuzumab to continue to total 1 year (through December 2020)  (a) echocardiogram 07/22/2018 showed an ejection fraction in the 55-60% range.  (3) adjuvant radiation pending   PLAN: Shanterica is doing well today.  She has no clinical sign of breast cancer recurrence.  She is tolerating the Trastuzumab well and will continue this.  Her labs are stable and I reviewed that with her in detail.    I gave Rachel Kelly a bottle Aveda lotion for her to use on her hands.  She was very Patent attorney.  I recommended that she continue to moisturize this area.    I placed a referral to PT for Rachel Kelly to review her concerns with the therapists.  Her neuropathy has improved, which is good.     She will be due for her next echocardiogram in early April.  I referred her to our cardiology  group for continued cardiac monitoring.    Rachel Kelly will return every three weeks for Trastuzumab.  We will see her again in April.  She knows to call for any other issues that may develop before the next visit.  A total of (20) minutes of face-to-face time was spent with this patient with greater than 50% of that time in counseling and care-coordination.     09/13/18 1:14 PM Scot Dock, NP Medical Oncology and Hematology [facility] [address] [place], [place] [address] Tel. [phone]    Fax. [phone]

## 2018-09-13 NOTE — Patient Instructions (Signed)
[facility] Discharge Instructions for Patients Receiving Chemotherapy Today you received the following chemotherapy agents:  Herceptin To help prevent nausea and vomiting after your treatment, we encourage you to take your nausea medication as prescribed.   If you develop nausea and vomiting that is not controlled by your nausea medication, call the clinic.   BELOW ARE SYMPTOMS THAT SHOULD BE REPORTED IMMEDIATELY:  *FEVER GREATER THAN 100.5 F  *CHILLS WITH OR WITHOUT FEVER  NAUSEA AND VOMITING THAT IS NOT CONTROLLED WITH YOUR NAUSEA MEDICATION  *UNUSUAL SHORTNESS OF BREATH  *UNUSUAL BRUISING OR BLEEDING  TENDERNESS IN MOUTH AND THROAT WITH OR WITHOUT PRESENCE OF ULCERS  *URINARY PROBLEMS  *BOWEL PROBLEMS  UNUSUAL RASH Items with * indicate a potential emergency and should be followed up as soon as possible.  Feel free to call the clinic should you have any questions or concerns. The clinic phone number is [phone].  Please show the CHEMO ALERT CARD at check-in to the Emergency Department and triage nurse.   

## 2018-09-14 ENCOUNTER — Telehealth: Payer: Self-pay | Admitting: Adult Health

## 2018-09-14 ENCOUNTER — Ambulatory Visit: Payer: Medicare Other

## 2018-09-14 NOTE — Telephone Encounter (Signed)
No los °

## 2018-09-15 ENCOUNTER — Ambulatory Visit: Payer: Medicare Other

## 2018-09-16 ENCOUNTER — Ambulatory Visit: Payer: Medicare Other

## 2018-09-17 ENCOUNTER — Ambulatory Visit: Payer: Medicare Other

## 2018-09-20 ENCOUNTER — Ambulatory Visit
Admission: RE | Admit: 2018-09-20 | Discharge: 2018-09-20 | Disposition: A | Discharge status: Home / Self Care | Payer: Medicare Other | Source: Ambulatory Visit | Attending: Radiation Oncology | Admitting: Radiation Oncology

## 2018-09-20 DIAGNOSIS — Z510 Encounter for antineoplastic radiation therapy: Secondary | ICD-10-CM | POA: Insufficient documentation

## 2018-09-20 DIAGNOSIS — C50411 Malignant neoplasm of upper-outer quadrant of right female breast: Secondary | ICD-10-CM | POA: Diagnosis not present

## 2018-09-20 DIAGNOSIS — Z171 Estrogen receptor negative status [ER-]: Secondary | ICD-10-CM | POA: Diagnosis not present

## 2018-09-20 MED ORDER — RADIAPLEXRX EX GEL
Freq: Once | CUTANEOUS | Status: AC
  Administered 2018-09-20: 19:00:00 via TOPICAL

## 2018-09-20 MED ORDER — ALRA NON-METALLIC DEODORANT (RAD-ONC)
1.0000 "application " | Freq: Once | TOPICAL | Status: AC
  Administered 2018-09-20: 1 via TOPICAL

## 2018-09-20 NOTE — Progress Notes (Signed)

## 2018-09-21 ENCOUNTER — Ambulatory Visit
Admission: RE | Admit: 2018-09-21 | Discharge: 2018-09-21 | Disposition: A | Discharge status: Home / Self Care | Payer: Medicare Other | Source: Ambulatory Visit | Attending: Radiation Oncology | Admitting: Radiation Oncology

## 2018-09-21 DIAGNOSIS — Z510 Encounter for antineoplastic radiation therapy: Secondary | ICD-10-CM | POA: Diagnosis not present

## 2018-09-22 ENCOUNTER — Other Ambulatory Visit: Payer: Self-pay

## 2018-09-22 ENCOUNTER — Encounter: Payer: Self-pay | Admitting: Rehabilitation

## 2018-09-22 ENCOUNTER — Ambulatory Visit
Admission: RE | Admit: 2018-09-22 | Discharge: 2018-09-22 | Disposition: A | Discharge status: Home / Self Care | Payer: Medicare Other | Source: Ambulatory Visit | Attending: Radiation Oncology | Admitting: Radiation Oncology

## 2018-09-22 ENCOUNTER — Ambulatory Visit: Payer: Medicare Other | Attending: Adult Health | Admitting: Rehabilitation

## 2018-09-22 DIAGNOSIS — R600 Localized edema: Secondary | ICD-10-CM | POA: Diagnosis present

## 2018-09-22 DIAGNOSIS — Z171 Estrogen receptor negative status [ER-]: Secondary | ICD-10-CM | POA: Diagnosis present

## 2018-09-22 DIAGNOSIS — R293 Abnormal posture: Secondary | ICD-10-CM

## 2018-09-22 DIAGNOSIS — Z510 Encounter for antineoplastic radiation therapy: Secondary | ICD-10-CM | POA: Diagnosis not present

## 2018-09-22 DIAGNOSIS — C50411 Malignant neoplasm of upper-outer quadrant of right female breast: Secondary | ICD-10-CM

## 2018-09-22 DIAGNOSIS — Z483 Aftercare following surgery for neoplasm: Secondary | ICD-10-CM

## 2018-09-22 NOTE — Therapy (Addendum)
[place], [place], [address] Phone: [phone]   Fax:  [phone]  Physical Therapy Evaluation  Patient Details  Name: Rachel Kelly MRN: [id number] Date of Birth: 05-22-1946 Referring Provider (PT): Wilber Bihari NP   Encounter Date: 09/22/2018  PT End of Session - 09/22/18 2043    Visit Number  5    Number of Visits  13    Date for PT Re-Evaluation  10/27/18    PT Start Time  6270    PT Stop Time  1100    PT Time Calculation (min)  45 min    Activity Tolerance  Patient tolerated treatment well    Behavior During Therapy  [facility] for tasks assessed/performed       Past Medical History:  Diagnosis Date  . Anemia   . Blood transfusion 2005  . Hernia   . HH (hiatus hernia)   . Hyperlipidemia   . Hypertension   . PAH (pulmonary artery hypertension) ([place])   . Sarcoidosis   . Shortness of breath    on exertion  . Sleep apnea    does not use cpap  . Wheezing     Past Surgical History:  Procedure Laterality Date  . ABDOMINAL HYSTERECTOMY  1988  . BACK SURGERY  2005   lower  . BREAST LUMPECTOMY WITH RADIOACTIVE SEED AND SENTINEL LYMPH NODE BIOPSY Right 07/02/2018   Procedure: RIGHT BREAST RADIOACTIVE SEED LUMPECTOMY X2 AND RIGHT AXILLARY  SENTINEL LYMPH NODE BIOPSY;  Surgeon: [name], MD;  Location: [facility];  Service: General;  Laterality: Right;  . ESOPHAGOGASTRODUODENOSCOPY  06/24/2012   Procedure: ESOPHAGOGASTRODUODENOSCOPY (EGD);  Surgeon: [name], MD;  Location: [name];  Service: General;  Laterality: N/A;  . HIATAL HERNIA REPAIR  08/18/2012   Procedure: LAPAROSCOPIC REPAIR OF HIATAL HERNIA;  Surgeon: [name], MD;  Location: [facility];  Service: General;  Laterality: N/A;  Laparoscopic Hiatal Hernia Repair   . INSERTION OF MESH  08/18/2012   Procedure: INSERTION OF MESH;  Surgeon: [name], MD;  Location: [facility];  Service: General;;  . IR IMAGING  GUIDED PORT INSERTION  07/19/2018  . PORTACATH PLACEMENT N/A 07/02/2018   Procedure: INSERTION PORT-A-CATH TRIED NO PORT IN PLACE;  Surgeon: [name], MD;  Location: [facility];  Service: General;  Laterality: N/A;  . VESICOVAGINAL FISTULA CLOSURE W/ TAH      There were no vitals filed for this visit.   Subjective Assessment - 09/22/18 1022    Subjective  I am not sure if I am doing the massage right. has no compression bra.  The arm started swelling unsure when. I noticed my sleeves were starting to get tighter     Pertinent History  Rt lumpectomy with SLNB 07/02/18 due to Right sided HER-2 positive, estrogen receptor negative breast cancer. 0/2 nodes positive Able to complete 3/12 chemo doses due to neuropathy. Now on Herceptin every 3 weeks for 1 year. EF 55%.  History of hysterectomy  1988.  Just started radiation on day 3 today.     Limitations  Lifting    Patient Stated Goals  get swelling down     Currently in Pain?  Yes    Pain Score  1     Pain Location  Breast    Pain Orientation  Right    Pain Descriptors / Indicators  Discomfort    Pain Type  Surgical pain    Pain Onset  More  than a month ago    Pain Frequency  Intermittent    Aggravating Factors   nothing    Pain Relieving Factors  nothing         [facility] PT Assessment - 09/22/18 0001      Assessment   Medical Diagnosis  s/p right lumpectomy and sentinel node biopsy    Referring Provider (PT)  Wilber Bihari NP    Onset Date/Surgical Date  07/02/18    Hand Dominance  Left    Prior Therapy  yes      Precautions   Precaution Comments  cancer, lymphedema      Restrictions   Weight Bearing Restrictions  No      Balance Screen   Has the patient fallen in the past 6 months  No    Has the patient had a decrease in activity level because of a fear of falling?   No    Is the patient reluctant to leave their home because of a fear of falling?   No      Home Environment   Living Environment  Private residence     Living Arrangements  Alone    Available Help at Discharge  Family      Prior Function   Level of Independence  Independent    Vocation  Retired    Biomedical scientist  Retired Patent examiner    Leisure  She is walking 25 minutes each day      Cognition   Overall Cognitive Status  Within Functional Limits for tasks assessed      Observation/Other Assessments   Observations  edema present in right breast which feels thick and fibrotic especially near the breast incision. This is also present around her axillary incision which is somewhat sensitive to touch.      Coordination   Gross Motor Movements are Fluid and Coordinated  Yes      Posture/Postural Control   Posture/Postural Control  Postural limitations    Postural Limitations  Forward head;Rounded Shoulders        LYMPHEDEMA/ONCOLOGY QUESTIONNAIRE - 09/22/18 1029      Type   Cancer Type  Right breast cancer      Surgeries   Lumpectomy Date  07/02/18    Sentinel Lymph Node Biopsy Date  07/02/18    Number Lymph Nodes Removed  2      Treatment   Active Chemotherapy Treatment  No    Past Chemotherapy Treatment  Yes    Active Radiation Treatment  Yes    Current Hormone Treatment  No    Past Hormone Therapy  No      Right Upper Extremity Lymphedema   15 cm Proximal to Olecranon Process  36.5 cm    10 cm Proximal to Olecranon Process  35.2 cm    Olecranon Process  27.6 cm    10 cm Proximal to Ulnar Styloid Process  22.6 cm    Just Proximal to Ulnar Styloid Process  16.6 cm    Across Hand at [employer]  20.3 cm    At [place] of 2nd Digit  6.3 cm    Other  30.3   2 cmabove elbow crease where pt feels most swollen     Left Upper Extremity Lymphedema   15 cm Proximal to Olecranon Process  36 cm    10 cm Proximal to Olecranon Process  34.7 cm    Olecranon Process  27.5 cm    10 cm  Proximal to Ulnar Styloid Process  23.8 cm    Just Proximal to Ulnar Styloid Process  17.5 cm    Across Hand at [employer]   20.4 cm    At [place] of 2nd Digit  6.8 cm    Other  29.3   2 cm above elbow crease              Objective measurements completed on examination: See above findings.      [facility] Adult PT Treatment/Exercise - 09/22/18 0001      Manual Therapy   Manual Therapy  Edema management    Edema Management  pt given 1/2" gray foam in stockinette for fibrosis near breast incision             PT Education - 09/22/18 2043    Education Details  foam use, POC    Person(s) Educated  Patient    Methods  Explanation;Demonstration;Verbal cues    Comprehension  Verbalized understanding;Returned demonstration          PT Long Term Goals - 09/22/18 2054      PT LONG TERM GOAL #1   Title  Pt will be independent with self MLD for the Rt breast to maintain throughout radiation    Time  6    Period  Weeks    Status  New      PT LONG TERM GOAL #2   Title  Patient will report >/= 25% reduction in the sensitivity around her breast and axillary incisions.    Time  6    Period  Weeks    Status  New      PT LONG TERM GOAL #3   Title  Pt will obtain appropriate compression bra for fibrosis management during and/or after radiation    Time  6    Period  Weeks             Plan - 09/22/18 2045    Clinical Impression Statement  Pt returns to PT here with continued Rt breast lymphedema.  Pt continues to have increased fibrosis especially near the breast incision with puckering proximally.  Pt reports she is doing the MLD but it is most likely not able to move through this fibrotic region.  Circumferential measurements were similar bilaterally today.  Discussed getting compression for the UE but pt not wanting to do it now if not required.  Swelling pt noticed in the upper arm most likely adipose and how pt was holding her arm.  Shoulder ROM not assessed today due to time. Will restart PT visits at this time as tolerated with radiation just starting.     Personal Factors and Comorbidities   Comorbidity 1    Comorbidities  starting radiation    Examination-Activity Limitations  Reach Overhead;Lift    Stability/Clinical Decision Making  Stable/Uncomplicated    Clinical Decision Making  Low    Rehab Potential  Excellent    Clinical Impairments Affecting Rehab Potential  radiation effects and schedule    PT Frequency  2x / week    PT Duration  6 weeks    PT Treatment/Interventions  ADLs/Self Care Home Management;Therapeutic exercise;Patient/family education;Manual lymph drainage;Scar mobilization;Passive range of motion;Manual techniques;Taping    PT Next Visit Plan  Assess shoulder ROM, Rt breast MLD and fibrosis STM, scar work, how was foam? compression script back for bras?    Recommended Other Services  compression bras, request via staff message to send prescription    Consulted and  Agree with Plan of Care  Patient       Patient will benefit from skilled therapeutic intervention in order to improve the following deficits and impairments:  Postural dysfunction, Decreased range of motion, Decreased knowledge of precautions, Impaired UE functional use, Pain, Increased edema  Visit Diagnosis: Abnormal posture  Aftercare following surgery for neoplasm  Localized edema  Malignant neoplasm of upper-outer quadrant of right breast in female, estrogen receptor negative ([place])     Problem List Patient Active Problem List   Diagnosis Date Noted  . Port-A-Cath in place 08/02/2018  . Pre-op evaluation 06/24/2018  . Malignant neoplasm of upper-outer quadrant of right breast in female, estrogen receptor negative ([place]) 06/07/2018  . Nausea & vomiting 11/21/2012  . Bilateral renal masses 11/21/2012  . Back pain, thoracic 08/14/2012  . Obstructive sleep apnea 05/09/2012  . GERD (gastroesophageal reflux disease) 06/28/2010  . HYPERLIPIDEMIA 12/09/2007  . HYPERTENSION 12/09/2007  . Sarcoidosis 11/26/2007  . DYSPNEA 11/26/2007    Shan Levans, PT 09/22/2018, 8:58 PM  [place], [place], [address] Phone: [phone]   Fax:  [phone]  Name: NAZARIA RIESEN MRN: [id number] Date of Birth: 16-Jun-1946

## 2018-09-22 NOTE — Patient Instructions (Signed)
Use gray foam pad over incision for fibrosis

## 2018-09-23 ENCOUNTER — Ambulatory Visit
Admission: RE | Admit: 2018-09-23 | Discharge: 2018-09-23 | Disposition: A | Discharge status: Home / Self Care | Payer: Medicare Other | Source: Ambulatory Visit | Attending: Radiation Oncology | Admitting: Radiation Oncology

## 2018-09-23 DIAGNOSIS — Z510 Encounter for antineoplastic radiation therapy: Secondary | ICD-10-CM | POA: Diagnosis not present

## 2018-09-24 ENCOUNTER — Ambulatory Visit
Admission: RE | Admit: 2018-09-24 | Discharge: 2018-09-24 | Disposition: A | Discharge status: Home / Self Care | Payer: Medicare Other | Source: Ambulatory Visit | Attending: Radiation Oncology | Admitting: Radiation Oncology

## 2018-09-24 DIAGNOSIS — Z510 Encounter for antineoplastic radiation therapy: Secondary | ICD-10-CM | POA: Diagnosis not present

## 2018-09-27 ENCOUNTER — Ambulatory Visit
Admission: RE | Admit: 2018-09-27 | Discharge: 2018-09-27 | Disposition: A | Discharge status: Home / Self Care | Payer: Medicare Other | Source: Ambulatory Visit | Attending: Radiation Oncology | Admitting: Radiation Oncology

## 2018-09-27 DIAGNOSIS — Z510 Encounter for antineoplastic radiation therapy: Secondary | ICD-10-CM | POA: Diagnosis not present

## 2018-09-28 ENCOUNTER — Ambulatory Visit
Admission: RE | Admit: 2018-09-28 | Discharge: 2018-09-28 | Disposition: A | Discharge status: Home / Self Care | Payer: Medicare Other | Source: Ambulatory Visit | Attending: Radiation Oncology | Admitting: Radiation Oncology

## 2018-09-28 DIAGNOSIS — Z510 Encounter for antineoplastic radiation therapy: Secondary | ICD-10-CM | POA: Diagnosis not present

## 2018-09-29 ENCOUNTER — Ambulatory Visit
Admission: RE | Admit: 2018-09-29 | Discharge: 2018-09-29 | Disposition: A | Discharge status: Home / Self Care | Payer: Medicare Other | Source: Ambulatory Visit | Attending: Radiation Oncology | Admitting: Radiation Oncology

## 2018-09-29 ENCOUNTER — Encounter: Payer: Self-pay | Admitting: Rehabilitation

## 2018-09-29 ENCOUNTER — Other Ambulatory Visit: Payer: Self-pay

## 2018-09-29 ENCOUNTER — Ambulatory Visit: Payer: Medicare Other | Admitting: Rehabilitation

## 2018-09-29 DIAGNOSIS — Z483 Aftercare following surgery for neoplasm: Secondary | ICD-10-CM

## 2018-09-29 DIAGNOSIS — R293 Abnormal posture: Secondary | ICD-10-CM | POA: Diagnosis not present

## 2018-09-29 DIAGNOSIS — Z510 Encounter for antineoplastic radiation therapy: Secondary | ICD-10-CM | POA: Diagnosis not present

## 2018-09-29 DIAGNOSIS — Z171 Estrogen receptor negative status [ER-]: Secondary | ICD-10-CM

## 2018-09-29 DIAGNOSIS — C50411 Malignant neoplasm of upper-outer quadrant of right female breast: Secondary | ICD-10-CM

## 2018-09-29 DIAGNOSIS — R600 Localized edema: Secondary | ICD-10-CM

## 2018-09-29 NOTE — Therapy (Signed)
[place], [place], [address] Phone: [phone]   Fax:  [phone]  Physical Therapy Treatment  Patient Details  Name: Rachel Kelly MRN: [id number] Date of Birth: Oct 13, 1945 Referring Provider (PT): Wilber Bihari NP   Encounter Date: 09/29/2018  PT End of Session - 09/29/18 1536    Visit Number  6    Number of Visits  13    Date for PT Re-Evaluation  10/27/18    PT Start Time  1287    PT Stop Time  1515    PT Time Calculation (min)  43 min    Activity Tolerance  Patient tolerated treatment well    Behavior During Therapy  [facility] for tasks assessed/performed       Past Medical History:  Diagnosis Date  . Anemia   . Blood transfusion 2005  . Hernia   . HH (hiatus hernia)   . Hyperlipidemia   . Hypertension   . PAH (pulmonary artery hypertension) ([place])   . Sarcoidosis   . Shortness of breath    on exertion  . Sleep apnea    does not use cpap  . Wheezing     Past Surgical History:  Procedure Laterality Date  . ABDOMINAL HYSTERECTOMY  1988  . BACK SURGERY  2005   lower  . BREAST LUMPECTOMY WITH RADIOACTIVE SEED AND SENTINEL LYMPH NODE BIOPSY Right 07/02/2018   Procedure: RIGHT BREAST RADIOACTIVE SEED LUMPECTOMY X2 AND RIGHT AXILLARY  SENTINEL LYMPH NODE BIOPSY;  Surgeon: [name], MD;  Location: [facility];  Service: General;  Laterality: Right;  . ESOPHAGOGASTRODUODENOSCOPY  06/24/2012   Procedure: ESOPHAGOGASTRODUODENOSCOPY (EGD);  Surgeon: [name], MD;  Location: [name];  Service: General;  Laterality: N/A;  . HIATAL HERNIA REPAIR  08/18/2012   Procedure: LAPAROSCOPIC REPAIR OF HIATAL HERNIA;  Surgeon: [name], MD;  [facility];  Service: General;  Laterality: N/A;  Laparoscopic Hiatal Hernia Repair   . INSERTION OF MESH  08/18/2012   Procedure: INSERTION OF MESH;  Surgeon: [name], MD;  [facility];  Service: General;;  . IR IMAGING  GUIDED PORT INSERTION  07/19/2018  . PORTACATH PLACEMENT N/A 07/02/2018   Procedure: INSERTION PORT-A-CATH TRIED NO PORT IN PLACE;  Surgeon: [name], MD;  Location: [facility];  Service: General;  Laterality: N/A;  . VESICOVAGINAL FISTULA CLOSURE W/ TAH      There were no vitals filed for this visit.  Subjective Assessment - 09/29/18 1433    Subjective  Theres a little more pain in the breast.  I tried the foam. Can't tell if it helped or not.      Pertinent History  Rt lumpectomy with SLNB 07/02/18 due to Right sided HER-2 positive, estrogen receptor negative breast cancer. 0/2 nodes positive Able to complete 3/12 chemo doses due to neuropathy. Now on Herceptin every 3 weeks for 1 year. EF 55%.  History of hysterectomy  1988.  Just started radiation on day 3 today.     Currently in Pain?  Yes    Pain Score  1     Pain Location  Breast    Pain Orientation  Right    Pain Descriptors / Indicators  Aching;Discomfort    Pain Type  Surgical pain;Chronic pain    Pain Onset  More than a month ago    Pain Frequency  Intermittent  [place] Adult PT Treatment/Exercise - 09/29/18 0001      Manual Therapy   Manual Therapy  Other (comment)    Edema Management  pt given larger 1/2" gray foam rectangle for the inferior breast.  Refaxed prescription for bras as it was not back yet.      Manual Lymphatic Drainage (MLD)   in supine : short neck avoiding right chest at port, superficial and deep abdominals. left axillary nodes  with right breast and chest towared left side., right inguinal nodes with right axillo-inguinal anastamosis , then to left sidelying for posterior interaxillary anastamosis, back and lateral chest then retrun to supine for pathways and brief lateral breast MLD with shoulder flexion     Other Manual Therapy  scar massage to the lumpectomy incision                  PT Long Term Goals - 09/22/18 2054      PT LONG TERM GOAL #1    Title  Pt will be independent with self MLD for the Rt breast to maintain throughout radiation    Time  6    Period  Weeks    Status  New      PT LONG TERM GOAL #2   Title  Patient will report >/= 25% reduction in the sensitivity around her breast and axillary incisions.    Time  6    Period  Weeks    Status  New      PT LONG TERM GOAL #3   Title  Pt will obtain appropriate compression bra for fibrosis management during and/or after radiation    Time  6    Period  Weeks            Plan - 09/29/18 1536    Clinical Impression Statement  Restarted Rt breast MLD with scar massage and deeper cross friction utilized here.  fibrosis and puckering at the incision and a bit more laterally.      PT Frequency  2x / week    PT Duration  6 weeks    PT Treatment/Interventions  ADLs/Self Care Home Management;Therapeutic exercise;Patient/family education;Manual lymph drainage;Scar mobilization;Passive range of motion;Manual techniques;Taping    PT Next Visit Plan  Assess shoulder ROM, Rt breast MLD and fibrosis STM, scar work, how was foam? compression script back for bras?       Patient will benefit from skilled therapeutic intervention in order to improve the following deficits and impairments:     Visit Diagnosis: Abnormal posture  Aftercare following surgery for neoplasm  Localized edema  Malignant neoplasm of upper-outer quadrant of right breast in female, estrogen receptor negative [facility])     Problem List Patient Active Problem List   Diagnosis Date Noted  . Port-A-Cath in place 08/02/2018  . Pre-op evaluation 06/24/2018  . Malignant neoplasm of upper-outer quadrant of right breast in female, estrogen receptor negative ([place]) 06/07/2018  . Nausea & vomiting 11/21/2012  . Bilateral renal masses 11/21/2012  . Back pain, thoracic 08/14/2012  . Obstructive sleep apnea 05/09/2012  . GERD (gastroesophageal reflux disease) 06/28/2010  . HYPERLIPIDEMIA 12/09/2007  . HYPERTENSION  12/09/2007  . Sarcoidosis 11/26/2007  . DYSPNEA 11/26/2007    Shan Levans, PT 09/29/2018, 3:38 PM  [place], [place], [address] Phone: [phone]   Fax:  [phone]  Name: Rachel Kelly MRN: [id number] Date of Birth: March 27, 1946

## 2018-09-30 ENCOUNTER — Ambulatory Visit
Admission: RE | Admit: 2018-09-30 | Discharge: 2018-09-30 | Disposition: A | Discharge status: Home / Self Care | Payer: Medicare Other | Source: Ambulatory Visit | Attending: Radiation Oncology | Admitting: Radiation Oncology

## 2018-09-30 DIAGNOSIS — Z510 Encounter for antineoplastic radiation therapy: Secondary | ICD-10-CM | POA: Diagnosis not present

## 2018-10-01 ENCOUNTER — Other Ambulatory Visit: Payer: Self-pay

## 2018-10-01 ENCOUNTER — Ambulatory Visit
Admission: RE | Admit: 2018-10-01 | Discharge: 2018-10-01 | Disposition: A | Discharge status: Home / Self Care | Payer: Medicare Other | Source: Ambulatory Visit | Attending: Radiation Oncology | Admitting: Radiation Oncology

## 2018-10-01 DIAGNOSIS — Z510 Encounter for antineoplastic radiation therapy: Secondary | ICD-10-CM | POA: Diagnosis not present

## 2018-10-04 ENCOUNTER — Ambulatory Visit
Admission: RE | Admit: 2018-10-04 | Discharge: 2018-10-04 | Disposition: A | Discharge status: Home / Self Care | Payer: Medicare Other | Source: Ambulatory Visit | Attending: Radiation Oncology | Admitting: Radiation Oncology

## 2018-10-04 ENCOUNTER — Inpatient Hospital Stay: Payer: Medicare Other

## 2018-10-04 ENCOUNTER — Other Ambulatory Visit: Payer: Self-pay

## 2018-10-04 ENCOUNTER — Inpatient Hospital Stay: Payer: Medicare Other | Attending: Oncology

## 2018-10-04 ENCOUNTER — Ambulatory Visit: Payer: Medicare Other | Admitting: Radiation Oncology

## 2018-10-04 DIAGNOSIS — C50411 Malignant neoplasm of upper-outer quadrant of right female breast: Secondary | ICD-10-CM | POA: Diagnosis present

## 2018-10-04 DIAGNOSIS — Z95828 Presence of other vascular implants and grafts: Secondary | ICD-10-CM

## 2018-10-04 DIAGNOSIS — Z5112 Encounter for antineoplastic immunotherapy: Secondary | ICD-10-CM | POA: Insufficient documentation

## 2018-10-04 DIAGNOSIS — Z510 Encounter for antineoplastic radiation therapy: Secondary | ICD-10-CM | POA: Diagnosis not present

## 2018-10-04 DIAGNOSIS — Z171 Estrogen receptor negative status [ER-]: Principal | ICD-10-CM

## 2018-10-04 LAB — CBC WITH DIFFERENTIAL/PLATELET
Abs Immature Granulocytes: 0.02 10*3/uL (ref 0.00–0.07)
Basophils Absolute: 0 10*3/uL (ref 0.0–0.1)
Basophils Relative: 0 %
EOS PCT: 1 %
Eosinophils Absolute: 0.1 10*3/uL (ref 0.0–0.5)
HCT: 34.6 % — ABNORMAL LOW (ref 36.0–46.0)
Hemoglobin: 11.4 g/dL — ABNORMAL LOW (ref 12.0–15.0)
Immature Granulocytes: 0 %
LYMPHS PCT: 28 %
Lymphs Abs: 1.4 10*3/uL (ref 0.7–4.0)
MCH: 30.1 pg (ref 26.0–34.0)
MCHC: 32.9 g/dL (ref 30.0–36.0)
MCV: 91.3 fL (ref 80.0–100.0)
MONOS PCT: 8 %
Monocytes Absolute: 0.4 10*3/uL (ref 0.1–1.0)
Neutro Abs: 3.1 10*3/uL (ref 1.7–7.7)
Neutrophils Relative %: 63 %
Platelets: 189 10*3/uL (ref 150–400)
RBC: 3.79 MIL/uL — ABNORMAL LOW (ref 3.87–5.11)
RDW: 13.4 % (ref 11.5–15.5)
WBC: 4.9 10*3/uL (ref 4.0–10.5)
nRBC: 0 % (ref 0.0–0.2)

## 2018-10-04 LAB — CMP (CANCER CENTER ONLY)
ALK PHOS: 67 U/L (ref 38–126)
ALT: 15 U/L (ref 0–44)
AST: 24 U/L (ref 15–41)
Albumin: 3.7 g/dL (ref 3.5–5.0)
Anion gap: 10 (ref 5–15)
BUN: 16 mg/dL (ref 8–23)
CALCIUM: 9.1 mg/dL (ref 8.9–10.3)
CO2: 25 mmol/L (ref 22–32)
Chloride: 106 mmol/L (ref 98–111)
Creatinine: 1.07 mg/dL — ABNORMAL HIGH (ref 0.44–1.00)
GFR, Est AFR Am: 59 mL/min — ABNORMAL LOW (ref >60)
GFR, Estimated: 51 mL/min — ABNORMAL LOW (ref >60)
Glucose, Bld: 114 mg/dL — ABNORMAL HIGH (ref 70–99)
Potassium: 3.9 mmol/L (ref 3.5–5.1)
Sodium: 141 mmol/L (ref 135–145)
TOTAL PROTEIN: 7 g/dL (ref 6.5–8.1)
Total Bilirubin: 0.6 mg/dL (ref 0.3–1.2)

## 2018-10-04 MED ORDER — ACETAMINOPHEN 325 MG PO TABS
650.0000 mg | ORAL_TABLET | Freq: Once | ORAL | Status: AC
  Administered 2018-10-04: 650 mg via ORAL

## 2018-10-04 MED ORDER — SODIUM CHLORIDE 0.9 % IV SOLN
Freq: Once | INTRAVENOUS | Status: AC
  Administered 2018-10-04: 14:00:00 via INTRAVENOUS
  Filled 2018-10-04: qty 250

## 2018-10-04 MED ORDER — DIPHENHYDRAMINE HCL 25 MG PO CAPS
ORAL_CAPSULE | ORAL | Status: AC
  Filled 2018-10-04: qty 1

## 2018-10-04 MED ORDER — DIPHENHYDRAMINE HCL 25 MG PO CAPS
25.0000 mg | ORAL_CAPSULE | Freq: Once | ORAL | Status: AC
  Administered 2018-10-04: 25 mg via ORAL

## 2018-10-04 MED ORDER — HEPARIN SOD (PORK) LOCK FLUSH 100 UNIT/ML IV SOLN
500.0000 [IU] | Freq: Once | INTRAVENOUS | Status: AC | PRN
  Administered 2018-10-04: 500 [IU]
  Filled 2018-10-04: qty 5

## 2018-10-04 MED ORDER — DIPHENHYDRAMINE HCL 25 MG PO TABS: 25.0000 mg | ORAL_TABLET | Freq: Once | ORAL | Status: DC

## 2018-10-04 MED ORDER — SODIUM CHLORIDE 0.9% FLUSH
10.0000 mL | INTRAVENOUS | Status: DC | PRN
  Administered 2018-10-04: 10 mL
  Filled 2018-10-04: qty 10

## 2018-10-04 MED ORDER — TRASTUZUMAB CHEMO 150 MG IV SOLR
450.0000 mg | Freq: Once | INTRAVENOUS | Status: AC
  Administered 2018-10-04: 450 mg via INTRAVENOUS
  Filled 2018-10-04: qty 21.43

## 2018-10-04 MED ORDER — ACETAMINOPHEN 325 MG PO TABS
ORAL_TABLET | ORAL | Status: AC
  Filled 2018-10-04: qty 2

## 2018-10-04 MED ORDER — SODIUM CHLORIDE 0.9% FLUSH
10.0000 mL | Freq: Once | INTRAVENOUS | Status: AC
  Administered 2018-10-04: 10 mL
  Filled 2018-10-04: qty 10

## 2018-10-04 NOTE — Patient Instructions (Signed)
[facility] Discharge Instructions for Patients Receiving Chemotherapy Today you received the following chemotherapy agents:  Herceptin To help prevent nausea and vomiting after your treatment, we encourage you to take your nausea medication as prescribed.   If you develop nausea and vomiting that is not controlled by your nausea medication, call the clinic.   BELOW ARE SYMPTOMS THAT SHOULD BE REPORTED IMMEDIATELY:  *FEVER GREATER THAN 100.5 F  *CHILLS WITH OR WITHOUT FEVER  NAUSEA AND VOMITING THAT IS NOT CONTROLLED WITH YOUR NAUSEA MEDICATION  *UNUSUAL SHORTNESS OF BREATH  *UNUSUAL BRUISING OR BLEEDING  TENDERNESS IN MOUTH AND THROAT WITH OR WITHOUT PRESENCE OF ULCERS  *URINARY PROBLEMS  *BOWEL PROBLEMS  UNUSUAL RASH Items with * indicate a potential emergency and should be followed up as soon as possible.  Feel free to call the clinic should you have any questions or concerns. The clinic phone number is [phone].  Please show the CHEMO ALERT CARD at check-in to the Emergency Department and triage nurse.   

## 2018-10-05 ENCOUNTER — Ambulatory Visit
Admission: RE | Admit: 2018-10-05 | Discharge: 2018-10-05 | Disposition: A | Discharge status: Home / Self Care | Payer: Medicare Other | Source: Ambulatory Visit | Attending: Radiation Oncology | Admitting: Radiation Oncology

## 2018-10-05 DIAGNOSIS — Z510 Encounter for antineoplastic radiation therapy: Secondary | ICD-10-CM | POA: Diagnosis not present

## 2018-10-06 ENCOUNTER — Encounter: Payer: Self-pay | Admitting: Rehabilitation

## 2018-10-06 ENCOUNTER — Ambulatory Visit: Payer: Medicare Other | Admitting: Rehabilitation

## 2018-10-06 ENCOUNTER — Ambulatory Visit
Admission: RE | Admit: 2018-10-06 | Discharge: 2018-10-06 | Disposition: A | Discharge status: Home / Self Care | Payer: Medicare Other | Source: Ambulatory Visit | Attending: Radiation Oncology | Admitting: Radiation Oncology

## 2018-10-06 ENCOUNTER — Other Ambulatory Visit: Payer: Self-pay

## 2018-10-06 DIAGNOSIS — R600 Localized edema: Secondary | ICD-10-CM

## 2018-10-06 DIAGNOSIS — C50411 Malignant neoplasm of upper-outer quadrant of right female breast: Secondary | ICD-10-CM

## 2018-10-06 DIAGNOSIS — Z483 Aftercare following surgery for neoplasm: Secondary | ICD-10-CM

## 2018-10-06 DIAGNOSIS — Z171 Estrogen receptor negative status [ER-]: Secondary | ICD-10-CM

## 2018-10-06 DIAGNOSIS — R293 Abnormal posture: Secondary | ICD-10-CM | POA: Diagnosis not present

## 2018-10-06 DIAGNOSIS — Z510 Encounter for antineoplastic radiation therapy: Secondary | ICD-10-CM | POA: Diagnosis not present

## 2018-10-06 NOTE — Patient Instructions (Signed)
Self manual lymph drainage: Perform this sequence once a day.  Only give enough pressure no your skin to make the skin move.  Diaphragmatic - Supine   1.)Inhale through nose making navel move out toward hands. Exhale through puckered lips, hands follow navel in. Repeat _5__ times.   2.) Hug yourself.  Do circles at your neck just above your collarbones.  Repeat this 10 times.  Axilla - One at a Time   3.) Using full weight of flat right hand and fingers at center of the left armpit, make _10__ in-place circles.     4.) With small finger side of hand against hip crease on the right side, gently perform circles at the crease. Repeat __10_ times.    Axilla to Inguinal Nodes - Sweep   5.) On involved side, sweep _4__ times from armpit along side of trunk to hip crease on the right side.  Use the left had for the top part and switch to the right had for the bottom.  Should take you about 4 pushes.    6.) Now gently stretch skin from the right side to the left side across the chest at the shoulder line.  Repeat that 4 times. Use your right hand to push about 3 times to get across.   7.) Draw an imaginary diagonal line from upper outer breast through the nipple area toward lower inner breast.    Direct fluid upward and inward from this line toward the pathway across your upper chest .        Direct fluid to treat all of lower outer breast tissue downward and outward toward pathway that is aimed at the left groin. *Do some scar tissue massage as you can.  Looking in the mirror   8.) Finish by doing the pathways as described above going from your involved armpit to the same side groin and going across your upper chest from the involved shoulder to the uninvolved shoulder.  9.) Repeat the steps above where you do circles in your left groin and right armpit. Copyright  [employer]. All rights reserved.

## 2018-10-06 NOTE — Therapy (Signed)
[place], [place], [address] Phone: [phone]   Fax:  [phone]  Physical Therapy Treatment  Patient Details  Name: NATINA WIGINTON MRN: [id number] Date of Birth: [birth year] Referring Provider (PT): Wilber Bihari NP   Encounter Date: 10/06/2018  PT End of Session - 10/06/18 1517    Visit Number  7    Number of Visits  13    Date for PT Re-Evaluation  10/27/18    PT Start Time  9628    PT Stop Time  1515    PT Time Calculation (min)  43 min    Activity Tolerance  Patient tolerated treatment well    Behavior During Therapy  [facility] for tasks assessed/performed       Past Medical History:  Diagnosis Date  . Anemia   . Blood transfusion 2005  . Hernia   . HH (hiatus hernia)   . Hyperlipidemia   . Hypertension   . PAH (pulmonary artery hypertension) ([place])   . Sarcoidosis   . Shortness of breath    on exertion  . Sleep apnea    does not use cpap  . Wheezing     Past Surgical History:  Procedure Laterality Date  . ABDOMINAL HYSTERECTOMY  1988  . BACK SURGERY  2005   lower  . BREAST LUMPECTOMY WITH RADIOACTIVE SEED AND SENTINEL LYMPH NODE BIOPSY Right 07/02/2018   Procedure: RIGHT BREAST RADIOACTIVE SEED LUMPECTOMY X2 AND RIGHT AXILLARY  SENTINEL LYMPH NODE BIOPSY;  Surgeon: [name], MD;  Location: [facility];  Service: General;  Laterality: Right;  . ESOPHAGOGASTRODUODENOSCOPY  06/24/2012   Procedure: ESOPHAGOGASTRODUODENOSCOPY (EGD);  Surgeon: [name], MD;  Location: [name];  Service: General;  Laterality: N/A;  . HIATAL HERNIA REPAIR  08/18/2012   Procedure: LAPAROSCOPIC REPAIR OF HIATAL HERNIA;  Surgeon: [name], MD;  [facility];  Service: General;  Laterality: N/A;  Laparoscopic Hiatal Hernia Repair   . INSERTION OF MESH  08/18/2012   Procedure: INSERTION OF MESH;  Surgeon: [name], MD;  [facility];  Service: General;;  . IR IMAGING  GUIDED PORT INSERTION  07/19/2018  . PORTACATH PLACEMENT N/A 07/02/2018   Procedure: INSERTION PORT-A-CATH TRIED NO PORT IN PLACE;  Surgeon: [name], MD;  Location: [facility];  Service: General;  Laterality: N/A;  . VESICOVAGINAL FISTULA CLOSURE W/ TAH      There were no vitals filed for this visit.  Subjective Assessment - 10/06/18 1434    Subjective  It feels good except the nipple.  Maybe from radiation.  I'm not red yet. I'm not sure if I'm doing it right.      Pertinent History  Rt lumpectomy with SLNB 07/02/18 due to Right sided HER-2 positive, estrogen receptor negative breast cancer. 0/2 nodes positive Able to complete 3/12 chemo doses due to neuropathy. Now on Herceptin every 3 weeks for 1 year. EF 55%.  History of hysterectomy  1988.  Just started radiation on day 3 today.     Patient Stated Goals  get swelling down     Currently in Pain?  Yes    Pain Score  1     Pain Location  Breast    Pain Orientation  Right    Pain Descriptors / Indicators  Aching;Discomfort    Pain Type  Surgical pain;Chronic pain    Pain Onset  More than a month ago    Pain Frequency  Intermittent  [place] Adult PT Treatment/Exercise - 10/06/18 0001      Manual Therapy   Manual Lymphatic Drainage (MLD)  focus today on self MLD for home with clinic closing due to COVID-19.  Pt performance of the whole breast MLD sequence per handout with instruction and cueing for each step.  Also added some scar tissue work.  Script for compression bras back so pt will stop by second to nature after this visit.               PT Education - 10/06/18 1516    Education Details  self MLD    Person(s) Educated  Patient    Methods  Explanation;Demonstration;Tactile cues;Verbal cues;Handout    Comprehension  Verbalized understanding;Returned demonstration;Verbal cues required;Tactile cues required          PT Long Term Goals - 10/06/18 1518      PT LONG TERM GOAL #1    Title  Pt will be independent with self MLD for the Rt breast to maintain throughout radiation    Status  Achieved      PT LONG TERM GOAL #2   Title  Patient will report >/= 25% reduction in the sensitivity around her breast and axillary incisions.    Status  Not Met      PT LONG TERM GOAL #3   Title  Pt will obtain appropriate compression bra for fibrosis management during and/or after radiation    Status  Partially Met      PT LONG TERM GOAL #4   Title  Patient will demonstrate self manual lymph drainage for management of her edema.    Status  Achieved            Plan - 10/06/18 1517    Clinical Impression Statement  Self MLD focus today due to clinic closing due to COVID-19.  Good performance and understanding by the patient.  Pt feels ind with self care at this point and will let us know 3 weeks after radiation if any edema is still present or not responding to self care    PT Next Visit Plan  reassess if pt returns in 4 weeks or so       Patient will benefit from skilled therapeutic intervention in order to improve the following deficits and impairments:     Visit Diagnosis: Abnormal posture  Aftercare following surgery for neoplasm  Localized edema  Malignant neoplasm of upper-outer quadrant of right breast in female, estrogen receptor negative ([place])     Problem List Patient Active Problem List   Diagnosis Date Noted  . Port-A-Cath in place 08/02/2018  . Pre-op evaluation 06/24/2018  . Malignant neoplasm of upper-outer quadrant of right breast in female, estrogen receptor negative ([place]) 06/07/2018  . Nausea & vomiting 11/21/2012  . Bilateral renal masses 11/21/2012  . Back pain, thoracic 08/14/2012  . Obstructive sleep apnea 05/09/2012  . GERD (gastroesophageal reflux disease) 06/28/2010  . HYPERLIPIDEMIA 12/09/2007  . HYPERTENSION 12/09/2007  . Sarcoidosis 11/26/2007  . DYSPNEA 11/26/2007    Stark Bray 10/06/2018, 3:20 PM  [place], [place], [address] Phone: [phone]   Fax:  [phone]  Name: VELINA DROLLINGER MRN: [id number] Date of Birth: 1945/11/03

## 2018-10-07 ENCOUNTER — Other Ambulatory Visit: Payer: Self-pay

## 2018-10-07 ENCOUNTER — Ambulatory Visit
Admission: RE | Admit: 2018-10-07 | Discharge: 2018-10-07 | Disposition: A | Discharge status: Home / Self Care | Payer: Medicare Other | Source: Ambulatory Visit | Attending: Radiation Oncology | Admitting: Radiation Oncology

## 2018-10-07 DIAGNOSIS — Z510 Encounter for antineoplastic radiation therapy: Secondary | ICD-10-CM | POA: Diagnosis not present

## 2018-10-08 ENCOUNTER — Other Ambulatory Visit: Payer: Self-pay

## 2018-10-08 ENCOUNTER — Ambulatory Visit
Admission: RE | Admit: 2018-10-08 | Discharge: 2018-10-08 | Disposition: A | Discharge status: Home / Self Care | Payer: Medicare Other | Source: Ambulatory Visit | Attending: Radiation Oncology | Admitting: Radiation Oncology

## 2018-10-08 DIAGNOSIS — Z510 Encounter for antineoplastic radiation therapy: Secondary | ICD-10-CM | POA: Diagnosis not present

## 2018-10-11 ENCOUNTER — Ambulatory Visit
Admission: RE | Admit: 2018-10-11 | Discharge: 2018-10-11 | Disposition: A | Discharge status: Home / Self Care | Payer: Medicare Other | Source: Ambulatory Visit | Attending: Radiation Oncology | Admitting: Radiation Oncology

## 2018-10-11 ENCOUNTER — Other Ambulatory Visit: Payer: Self-pay

## 2018-10-11 DIAGNOSIS — Z510 Encounter for antineoplastic radiation therapy: Secondary | ICD-10-CM | POA: Diagnosis not present

## 2018-10-12 ENCOUNTER — Other Ambulatory Visit: Payer: Self-pay

## 2018-10-12 ENCOUNTER — Ambulatory Visit
Admission: RE | Admit: 2018-10-12 | Discharge: 2018-10-12 | Disposition: A | Discharge status: Home / Self Care | Payer: Medicare Other | Source: Ambulatory Visit | Attending: Radiation Oncology | Admitting: Radiation Oncology

## 2018-10-12 DIAGNOSIS — Z510 Encounter for antineoplastic radiation therapy: Secondary | ICD-10-CM | POA: Diagnosis not present

## 2018-10-13 ENCOUNTER — Other Ambulatory Visit: Payer: Self-pay

## 2018-10-13 ENCOUNTER — Ambulatory Visit
Admission: RE | Admit: 2018-10-13 | Discharge: 2018-10-13 | Disposition: A | Discharge status: Home / Self Care | Payer: Medicare Other | Source: Ambulatory Visit | Attending: Radiation Oncology | Admitting: Radiation Oncology

## 2018-10-13 ENCOUNTER — Encounter: Payer: Medicare Other | Admitting: Rehabilitation

## 2018-10-13 DIAGNOSIS — Z171 Estrogen receptor negative status [ER-]: Principal | ICD-10-CM

## 2018-10-13 DIAGNOSIS — C50411 Malignant neoplasm of upper-outer quadrant of right female breast: Secondary | ICD-10-CM

## 2018-10-13 DIAGNOSIS — Z510 Encounter for antineoplastic radiation therapy: Secondary | ICD-10-CM | POA: Diagnosis not present

## 2018-10-13 MED ORDER — RADIAPLEXRX EX GEL
Freq: Once | CUTANEOUS | Status: AC
  Administered 2018-10-13: 12:00:00 via TOPICAL

## 2018-10-14 ENCOUNTER — Ambulatory Visit
Admission: RE | Admit: 2018-10-14 | Discharge: 2018-10-14 | Disposition: A | Discharge status: Home / Self Care | Payer: Medicare Other | Source: Ambulatory Visit | Attending: Radiation Oncology | Admitting: Radiation Oncology

## 2018-10-14 ENCOUNTER — Other Ambulatory Visit: Payer: Self-pay

## 2018-10-14 DIAGNOSIS — Z510 Encounter for antineoplastic radiation therapy: Secondary | ICD-10-CM | POA: Diagnosis not present

## 2018-10-15 ENCOUNTER — Other Ambulatory Visit: Payer: Self-pay

## 2018-10-15 ENCOUNTER — Ambulatory Visit
Admission: RE | Admit: 2018-10-15 | Discharge: 2018-10-15 | Disposition: A | Discharge status: Home / Self Care | Payer: Medicare Other | Source: Ambulatory Visit | Attending: Radiation Oncology | Admitting: Radiation Oncology

## 2018-10-15 DIAGNOSIS — Z510 Encounter for antineoplastic radiation therapy: Secondary | ICD-10-CM | POA: Diagnosis not present

## 2018-10-18 ENCOUNTER — Ambulatory Visit: Payer: Medicare Other

## 2018-10-19 ENCOUNTER — Encounter: Payer: Self-pay | Admitting: Radiation Oncology

## 2018-10-19 ENCOUNTER — Ambulatory Visit: Payer: Medicare Other

## 2018-10-19 NOTE — Progress Notes (Signed)
  Patient Name: Rachel Kelly MRN: [id number] DOB: 1946/04/15 Referring Physician: Lurline Del (Profile Not Attached) Date of Service: 10/15/2018 [facility]-, [place]                                                        End Of Treatment Note  Diagnoses: C50.411-Malignant neoplasm of upper-outer quadrant of right female breast  Cancer Staging: Clinical stage from 06/09/2018: Stage IB (cT1b, cN0, cM0, G2, ER-, PR-, HER2-) - Unsigned - Pathologic: Stage IA (pT1b, pN0, cM0, G2, ER-, PR-, HER2+) - Signed by Eppie Gibson, MD on 09/01/2018  Intent: Curative  Radiation Treatment Dates: 09/20/2018 through 10/15/2018 Site Technique Total Dose Dose per Fx Completed Fx Beam Energies  Breast: Breast_Rt 3D 40.05/40.05 2.67 15/15 6X, 10X  Breast: Breast_Rt_Bst 3D 10/10 2 5/5 6X, 10X   Narrative: The patient tolerated radiation therapy relatively well. She has slight hyperpigmentation to her right breast and continues to use Radiaplex as directed. She also noted mild fatigue.  Plan: The patient will follow-up with radiation oncology in one month.  ________________________________________________  Eppie Gibson, MD  This document serves as a record of services personally performed by Eppie Gibson, MD. It was created on her behalf by Rae Lips, a trained medical scribe. The creation of this record is based on the scribe's personal observations and the provider's statements to them. This document has been checked and approved by the attending provider.

## 2018-10-20 ENCOUNTER — Encounter: Payer: Medicare Other | Admitting: Rehabilitation

## 2018-10-20 ENCOUNTER — Ambulatory Visit: Payer: Medicare Other

## 2018-10-21 ENCOUNTER — Ambulatory Visit: Payer: Medicare Other

## 2018-10-22 ENCOUNTER — Ambulatory Visit: Payer: Medicare Other

## 2018-10-25 ENCOUNTER — Other Ambulatory Visit: Payer: Self-pay

## 2018-10-25 ENCOUNTER — Inpatient Hospital Stay: Payer: Medicare Other

## 2018-10-25 ENCOUNTER — Ambulatory Visit: Payer: Medicare Other

## 2018-10-25 ENCOUNTER — Inpatient Hospital Stay: Payer: Medicare Other | Attending: Oncology

## 2018-10-25 ENCOUNTER — Inpatient Hospital Stay: Payer: Medicare Other | Admitting: Adult Health

## 2018-10-25 ENCOUNTER — Encounter: Payer: Self-pay | Admitting: Adult Health

## 2018-10-25 VITALS — BP 123/76 | HR 71 | Temp 98.7°F | Resp 18 | Ht 59.0 in | Wt 165.7 lb

## 2018-10-25 DIAGNOSIS — Z171 Estrogen receptor negative status [ER-]: Secondary | ICD-10-CM

## 2018-10-25 DIAGNOSIS — Z79899 Other long term (current) drug therapy: Secondary | ICD-10-CM | POA: Diagnosis not present

## 2018-10-25 DIAGNOSIS — Z5112 Encounter for antineoplastic immunotherapy: Secondary | ICD-10-CM | POA: Insufficient documentation

## 2018-10-25 DIAGNOSIS — Z800 Family history of malignant neoplasm of digestive organs: Secondary | ICD-10-CM

## 2018-10-25 DIAGNOSIS — C50411 Malignant neoplasm of upper-outer quadrant of right female breast: Secondary | ICD-10-CM

## 2018-10-25 DIAGNOSIS — Z809 Family history of malignant neoplasm, unspecified: Secondary | ICD-10-CM | POA: Diagnosis not present

## 2018-10-25 DIAGNOSIS — Z8041 Family history of malignant neoplasm of ovary: Secondary | ICD-10-CM

## 2018-10-25 DIAGNOSIS — Z803 Family history of malignant neoplasm of breast: Secondary | ICD-10-CM | POA: Diagnosis not present

## 2018-10-25 DIAGNOSIS — Z95828 Presence of other vascular implants and grafts: Secondary | ICD-10-CM

## 2018-10-25 LAB — COMPREHENSIVE METABOLIC PANEL
ALT: 12 U/L (ref 0–44)
AST: 20 U/L (ref 15–41)
Albumin: 3.5 g/dL (ref 3.5–5.0)
Alkaline Phosphatase: 67 U/L (ref 38–126)
Anion gap: 8 (ref 5–15)
BUN: 21 mg/dL (ref 8–23)
CO2: 25 mmol/L (ref 22–32)
Calcium: 9 mg/dL (ref 8.9–10.3)
Chloride: 109 mmol/L (ref 98–111)
Creatinine, Ser: 1.15 mg/dL — ABNORMAL HIGH (ref 0.44–1.00)
GFR calc Af Amer: 54 mL/min — ABNORMAL LOW (ref >60)
GFR calc non Af Amer: 47 mL/min — ABNORMAL LOW (ref >60)
Glucose, Bld: 96 mg/dL (ref 70–99)
Potassium: 3.9 mmol/L (ref 3.5–5.1)
Sodium: 142 mmol/L (ref 135–145)
Total Bilirubin: 0.3 mg/dL (ref 0.3–1.2)
Total Protein: 6.8 g/dL (ref 6.5–8.1)

## 2018-10-25 LAB — CBC WITH DIFFERENTIAL/PLATELET
Abs Immature Granulocytes: 0.01 10*3/uL (ref 0.00–0.07)
Basophils Absolute: 0 10*3/uL (ref 0.0–0.1)
Basophils Relative: 1 %
Eosinophils Absolute: 0.1 10*3/uL (ref 0.0–0.5)
Eosinophils Relative: 1 %
HCT: 34.6 % — ABNORMAL LOW (ref 36.0–46.0)
Hemoglobin: 11.2 g/dL — ABNORMAL LOW (ref 12.0–15.0)
Immature Granulocytes: 0 %
Lymphocytes Relative: 27 %
Lymphs Abs: 1.2 10*3/uL (ref 0.7–4.0)
MCH: 29.9 pg (ref 26.0–34.0)
MCHC: 32.4 g/dL (ref 30.0–36.0)
MCV: 92.5 fL (ref 80.0–100.0)
Monocytes Absolute: 0.4 10*3/uL (ref 0.1–1.0)
Monocytes Relative: 9 %
Neutro Abs: 2.6 10*3/uL (ref 1.7–7.7)
Neutrophils Relative %: 62 %
Platelets: 160 10*3/uL (ref 150–400)
RBC: 3.74 MIL/uL — ABNORMAL LOW (ref 3.87–5.11)
RDW: 13.2 % (ref 11.5–15.5)
WBC: 4.2 10*3/uL (ref 4.0–10.5)
nRBC: 0 % (ref 0.0–0.2)

## 2018-10-25 MED ORDER — DIPHENHYDRAMINE HCL 25 MG PO CAPS
ORAL_CAPSULE | ORAL | Status: AC
  Filled 2018-10-25: qty 1

## 2018-10-25 MED ORDER — TRASTUZUMAB CHEMO 150 MG IV SOLR
450.0000 mg | Freq: Once | INTRAVENOUS | Status: AC
  Administered 2018-10-25: 450 mg via INTRAVENOUS
  Filled 2018-10-25: qty 21.43

## 2018-10-25 MED ORDER — ACETAMINOPHEN 325 MG PO TABS
650.0000 mg | ORAL_TABLET | Freq: Once | ORAL | Status: AC
  Administered 2018-10-25: 650 mg via ORAL

## 2018-10-25 MED ORDER — SODIUM CHLORIDE 0.9% FLUSH
10.0000 mL | Freq: Once | INTRAVENOUS | Status: AC
  Administered 2018-10-25: 10 mL
  Filled 2018-10-25: qty 10

## 2018-10-25 MED ORDER — SODIUM CHLORIDE 0.9 % IV SOLN
Freq: Once | INTRAVENOUS | Status: AC
  Administered 2018-10-25: 14:00:00 via INTRAVENOUS
  Filled 2018-10-25: qty 250

## 2018-10-25 MED ORDER — HEPARIN SOD (PORK) LOCK FLUSH 100 UNIT/ML IV SOLN
500.0000 [IU] | Freq: Once | INTRAVENOUS | Status: AC | PRN
  Administered 2018-10-25: 500 [IU]
  Filled 2018-10-25: qty 5

## 2018-10-25 MED ORDER — SODIUM CHLORIDE 0.9% FLUSH
10.0000 mL | INTRAVENOUS | Status: DC | PRN
  Administered 2018-10-25: 10 mL
  Filled 2018-10-25: qty 10

## 2018-10-25 MED ORDER — ACETAMINOPHEN 325 MG PO TABS
ORAL_TABLET | ORAL | Status: AC
  Filled 2018-10-25: qty 2

## 2018-10-25 MED ORDER — DIPHENHYDRAMINE HCL 25 MG PO CAPS
25.0000 mg | ORAL_CAPSULE | Freq: Once | ORAL | Status: AC
  Administered 2018-10-25: 25 mg via ORAL

## 2018-10-25 NOTE — Progress Notes (Signed)
Rachel Kelly  [phone] [phone]     ID: SHAMONA WIRTZ DOB: [birth year]  MR#: [id number]  YOV#:[id number]  Patient Care Team: [name], MD as PCP - General (Family Medicine) Deneise Lever, MD as Referring Physician (Pulmonary Disease) [name], MD as Consulting Physician (General Surgery) [name], [name], MD as Consulting Physician (Oncology) Eppie Gibson, MD as Attending Physician (Radiation Oncology) Chelsea Aus, DDS as Consulting Physician (Dentistry) Harriett Sine, MD as Consulting Physician (Dermatology) Bensimhon, Shaune Pascal, MD as Consulting Physician (Cardiology) OTHER MD:  CHIEF COMPLAINT: HER-2 positive, estrogen receptor negative breast cancer  CURRENT TREATMENT: anti-HER-2 immunotherapy  INTERVAL HISTORY: Rachel Kelly returns today for follow-up and treatment of her HER-2 positive breast cancer. She is unaccompanied today.    She was able to receive only 3 of the 12 planned doses of weekly paclitaxel because of the early development of peripheral neuropathy.  She is now receiving trastuzumab every 3 weeks, which will be continued to complete a year. She is due for another dose today  Her most recent echocardiogram was on 07/22/2018 with EF of 55 - 60%.  She will be due for repeat this month.  Rachel Kelly completed adjuvant radiation on 10/15/2018 and tolerated it well.    REVIEW OF SYSTEMS: Rachel Kelly continues to feel well.  She notes that she is isolating from others during the Covid pandemic.  She denies any fevers, chills, chest pain, palpitations, cough, shortness of breath, bowel/bladder changes, nausea or vomiting.  She is excited that her hair is growing back.  A detailed ROS was otherwise non contributory.     HISTORY OF CURRENT ILLNESS: From the original intake note:  Rachel Kelly had routine screening mammography on 05/26/2018 showing a possible abnormality in the right breast. She underwent bilateral diagnostic  mammography with tomography and right breast ultrasonography at [facility] on 06/02/2018 showing: breast density category C, suspicious mass at the 10 o'clock position 8 cm from the nipple. It measures 10 x 7 x 6 mm. Just adjacent to this mass, there is an irregular hypoechoic mass with punctate echogenic foci. It measures 9 x 8 x 3 mm. Additional indeterminate mass at the 9 o'clock position 6 cm from the nipple. It measures 6 x 4 x 4 mm. No suspicious right axillary lymphadenopathy.  Accordingly on 06/02/2018 she proceeded to biopsy of two of the right breast areas in question. The pathology from this procedure showed [phone]): at the 10 o'clock position, invasive ductal carcinoma, grade 2, estrogen and progesterone receptor negative, HER-2 equivocal by immunohistochemistry at 2+, but negative by [facility] with a signals ratio of 1.47 and number per cell 2.20.  Biopsy of the right breast lesion at the 9 o'clock position showed a sclerotic lesion with calcifications and negative for carcinoma.  The patient's subsequent history is as detailed below.   PAST MEDICAL HISTORY: Past Medical History:  Diagnosis Date  . Anemia   . Blood transfusion 2005  . Hernia   . HH (hiatus hernia)   . Hyperlipidemia   . Hypertension   . PAH (pulmonary artery hypertension) ([place])   . Sarcoidosis   . Shortness of breath    on exertion  . Sleep apnea    does not use cpap  . Wheezing     PAST SURGICAL HISTORY: Past Surgical History:  Procedure Laterality Date  . ABDOMINAL HYSTERECTOMY  1988  . BACK SURGERY  2005   lower  . BREAST LUMPECTOMY WITH RADIOACTIVE SEED AND SENTINEL LYMPH  NODE BIOPSY Right 07/02/2018   Procedure: RIGHT BREAST RADIOACTIVE SEED LUMPECTOMY X2 AND RIGHT AXILLARY  SENTINEL LYMPH NODE BIOPSY;  Surgeon: [name], MD;  Location: [facility];  Service: General;  Laterality: Right;  . ESOPHAGOGASTRODUODENOSCOPY  06/24/2012   Procedure: ESOPHAGOGASTRODUODENOSCOPY (EGD);  Surgeon:  [name], MD;  Location: [name];  Service: General;  Laterality: N/A;  . HIATAL HERNIA REPAIR  08/18/2012   Procedure: LAPAROSCOPIC REPAIR OF HIATAL HERNIA;  Surgeon: [name], MD;  [facility];  Service: General;  Laterality: N/A;  Laparoscopic Hiatal Hernia Repair   . INSERTION OF MESH  08/18/2012   Procedure: INSERTION OF MESH;  Surgeon: [name], MD;  [facility];  Service: General;;  . IR IMAGING GUIDED PORT INSERTION  07/19/2018  . PORTACATH PLACEMENT N/A 07/02/2018   Procedure: INSERTION PORT-A-CATH TRIED NO PORT IN PLACE;  Surgeon: [name], MD;  Location: [facility];  Service: General;  Laterality: N/A;  . VESICOVAGINAL FISTULA CLOSURE W/ TAH      FAMILY HISTORY Family History  Problem Relation Age of Onset  . Clotting disorder Mother        PE  . Heart disease Father   . Cancer Maternal Grandmother        not sure type, but spread to the brain.  . Coronary artery disease Other   . Cancer Other        ? type   She notes that her father died from heart disease at age 47. Patients' mother lives in a nursing home and is 6 (as of November 2019)-- she has a form of dementia. The patient has 3 brothers and 0 sisters. A maternal aunt had a form of cancer, not clear what it was.  The maternal grandmother had breast cancer, but was never treated because of her old age.  The patient does not think anyone in her family had ovarian cancer, but there are 2 others in her family that had colon cancer.    GYNECOLOGIC HISTORY:  No LMP recorded. Patient has had a hysterectomy. 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 She lives alone. She does not have any pets.  Her daughter Deatra Canter "Cantrece" Grandville Silos, 48, lives in [place] and works  in Psychologist, educational. No grandchildren.  The patient belongs to [place]: In place, but she can't remember who her power of attorney is (believes it is her daughter).   HEALTH MAINTENANCE: Social History   Tobacco Use  . Smoking status: Never Smoker  . Smokeless tobacco: Never Used  Substance Use Topics  . Alcohol use: No  . Drug use: No    Colonoscopy: Dr. Collene Mares  PAP: yes, but not sure when  Bone density: No   Allergies  Allergen Reactions  . Penicillins Rash    Fine red bumps that spread all over the body. Has patient had a PCN reaction causing immediate rash, facial/tongue/throat swelling, SOB or lightheadedness with hypotension: No Has patient had a PCN reaction causing severe rash involving mucus membranes or skin necrosis: No Has patient had a PCN reaction that required hospitalization: No Has patient had a PCN reaction occurring within the last 10 years: No If all of the above answers are "NO", then may  proceed with Cephalosporin use.   . Skin Adhesives [Cyanoacrylate] Itching and Rash    Dermabond    Current Outpatient Medications  Medication Sig Dispense Refill  . Ascorbic Acid (VITAMIN C) 1000 MG tablet Take 1,000 mg by mouth every other day.     . Cholecalciferol (VITAMIN D) 2000 UNITS CAPS Take 2,000 Units by mouth daily.     . Coenzyme Q10 200 MG TABS Take 200 mg by mouth daily.    . hydrocortisone 2.5 % cream Apply 1 application topically 2 (two) times daily as needed (dry skin).    Marland Kitchen ibuprofen (ADVIL,MOTRIN) 200 MG tablet Take 400 mg by mouth every 6 (six) hours as needed for headache or moderate pain.    Marland Kitchen ketotifen (ALAWAY) 0.025 % ophthalmic solution Place 1 drop into both eyes 2 (two) times daily.    Marland Kitchen loperamide (IMODIUM A-D) 2 MG capsule Take 2 mg by mouth daily as needed for diarrhea or loose stools.    Marland Kitchen omeprazole (PRILOSEC) 20 MG capsule Take 20 mg by mouth daily.     . phenazopyridine (PYRIDIUM) 100 MG tablet Take 1  tablet (100 mg total) by mouth 3 (three) times daily as needed for pain. 10 tablet 0  . Polyvinyl Alcohol-Povidone PF (REFRESH) 1.4-0.6 % SOLN Place 1 drop into both eyes daily as needed (dry eyes).     . potassium gluconate 595 MG TABS Take 595 mg by mouth daily.     . simvastatin (ZOCOR) 20 MG tablet Take 20 mg by mouth every morning.     . sulfamethoxazole-trimethoprim (BACTRIM DS,SEPTRA DS) 800-160 MG tablet Take 1 tablet by mouth 2 (two) times daily. 14 tablet 0  . traMADol (ULTRAM) 50 MG tablet Take 1 tablet (50 mg total) by mouth every 6 (six) hours as needed. 15 tablet 1  . triamterene-hydrochlorothiazide (MAXZIDE-25) 37.5-25 MG per tablet Take 1 tablet by mouth every morning.      No current facility-administered medications for this visit.     OBJECTIVE:   Vitals:   10/25/18 1255  BP: 123/76  Pulse: 71  Resp: 18  Temp: 98.7 F (37.1 C)  SpO2: 100%     Body mass index is 33.47 kg/m.   Wt Readings from Last 3 Encounters:  10/25/18 165 lb 11.2 oz (75.2 kg)  09/13/18 168 lb 1.6 oz (76.2 kg)  09/01/18 172 lb 3.2 oz (78.1 kg)  ECOG FS:1 - Symptomatic but completely ambulatory  GENERAL: Patient is a well appearing female in no acute distress HEENT:  Sclerae anicteric.  Oropharynx clear and moist. No ulcerations or evidence of oropharyngeal candidiasis. Neck is supple.  NODES:  No cervical, supraclavicular, or axillary lymphadenopathy palpated.  BREAST EXAM: right breast s/p lumpectomy and radiation, no sign of local recurrence, left breast benign LUNGS:  Clear to auscultation bilaterally.  No wheezes or rhonchi. HEART:  Regular rate and rhythm. No murmur appreciated. ABDOMEN:  Soft, nontender.  Positive, normoactive bowel sounds. No organomegaly palpated. MSK:  No focal spinal tenderness to palpation. Full range of motion bilaterally in the upper extremities. EXTREMITIES:  No peripheral edema.   SKIN:  Clear with no obvious rashes or skin changes. No nail dyscrasia. NEURO:   Nonfocal. Well oriented.  Appropriate affect.      LAB RESULTS:  CMP     Component Value Date/Time   NA 141 10/04/2018 1221   K 3.9 10/04/2018 1221   CL 106 10/04/2018 1221   CO2 25 10/04/2018 1221   GLUCOSE 114 (H)  10/04/2018 1221   BUN 16 10/04/2018 1221   CREATININE 1.07 (H) 10/04/2018 1221   CALCIUM 9.1 10/04/2018 1221   PROT 7.0 10/04/2018 1221   ALBUMIN 3.7 10/04/2018 1221   AST 24 10/04/2018 1221   ALT 15 10/04/2018 1221   ALKPHOS 67 10/04/2018 1221   BILITOT 0.6 10/04/2018 1221   GFRNONAA 51 (L) 10/04/2018 1221   GFRAA 59 (L) 10/04/2018 1221    No results found for: TOTALPROTELP, ALBUMINELP, A1GS, A2GS, BETS, BETA2SER, GAMS, MSPIKE, SPEI  No results found for: KPAFRELGTCHN, LAMBDASER, KAPLAMBRATIO  Lab Results  Component Value Date   WBC 4.2 10/25/2018   NEUTROABS 2.6 10/25/2018   HGB 11.2 (L) 10/25/2018   HCT 34.6 (L) 10/25/2018   MCV 92.5 10/25/2018   PLT 160 10/25/2018    @LASTCHEMISTRY @  No results found for: LABCA2  No components found for: MGNOIB704  No results for input(s): INR in the last 168 hours.  No results found for: LABCA2  No results found for: UGQ916  No results found for: XIH038  No results found for: UEK800  No results found for: CA2729  No components found for: HGQUANT  No results found for: CEA1 / No results found for: CEA1   No results found for: AFPTUMOR  No results found for: CHROMOGRNA  No results found for: PSA1  Appointment on 10/25/2018  Component Date Value Ref Range Status  . WBC 10/25/2018 4.2  4.0 - 10.5 K/uL Final  . RBC 10/25/2018 3.74* 3.87 - 5.11 MIL/uL Final  . Hemoglobin 10/25/2018 11.2* 12.0 - 15.0 g/dL Final  . HCT 10/25/2018 34.6* 36.0 - 46.0 % Final  . MCV 10/25/2018 92.5  80.0 - 100.0 fL Final  . MCH 10/25/2018 29.9  26.0 - 34.0 pg Final  . MCHC 10/25/2018 32.4  30.0 - 36.0 g/dL Final  . RDW 10/25/2018 13.2  11.5 - 15.5 % Final  . Platelets 10/25/2018 160  150 - 400 K/uL Final  . nRBC  10/25/2018 0.0  0.0 - 0.2 % Final  . Neutrophils Relative % 10/25/2018 62  % Final  . Neutro Abs 10/25/2018 2.6  1.7 - 7.7 K/uL Final  . Lymphocytes Relative 10/25/2018 27  % Final  . Lymphs Abs 10/25/2018 1.2  0.7 - 4.0 K/uL Final  . Monocytes Relative 10/25/2018 9  % Final  . Monocytes Absolute 10/25/2018 0.4  0.1 - 1.0 K/uL Final  . Eosinophils Relative 10/25/2018 1  % Final  . Eosinophils Absolute 10/25/2018 0.1  0.0 - 0.5 K/uL Final  . Basophils Relative 10/25/2018 1  % Final  . Basophils Absolute 10/25/2018 0.0  0.0 - 0.1 K/uL Final  . Immature Granulocytes 10/25/2018 0  % Final  . Abs Immature Granulocytes 10/25/2018 0.01  0.00 - 0.07 K/uL Final   Performed at [facility], [address] [place], [place] [address]    (this displays the last labs from the last 3 days)  No results found for: TOTALPROTELP, ALBUMINELP, A1GS, A2GS, BETS, BETA2SER, GAMS, MSPIKE, SPEI (this displays SPEP labs)  No results found for: KPAFRELGTCHN, LAMBDASER, KAPLAMBRATIO (kappa/lambda light chains)  No results found for: HGBA, HGBA2QUANT, HGBFQUANT, HGBSQUAN (Hemoglobinopathy evaluation)   No results found for: LDH  No results found for: IRON, TIBC, IRONPCTSAT (Iron and TIBC)  No results found for: FERRITIN  Urinalysis    Component Value Date/Time   COLORURINE STRAW (A) 07/27/2018 1050   APPEARANCEUR CLEAR 07/27/2018 1050   LABSPEC 1.011 07/27/2018 1050   PHURINE 6.0 07/27/2018  [place] 07/27/2018 1050   HGBUR SMALL (A) 07/27/2018 1050   BILIRUBINUR NEGATIVE 07/27/2018 1050   KETONESUR NEGATIVE 07/27/2018 1050   PROTEINUR NEGATIVE 07/27/2018 1050   UROBILINOGEN 1.0 11/21/2012 1402   NITRITE NEGATIVE 07/27/2018 1050   LEUKOCYTESUR MODERATE (A) 07/27/2018 1050     STUDIES:  No results found.  ELIGIBLE FOR AVAILABLE RESEARCH PROTOCOL: PREVENT  ASSESSMENT: 73 y.o. [place] woman status post right breast upper outer quadrant biopsy  06/02/2018 for a clinical T1b N0, stage IB invasive ductal carcinoma, grade 2, triple negative, with an MIB-1 of 10%  (1) status post right lumpectomy and sentinel lymph node sampling 07/02/2018 for a pT1b pN0,stage IA invasive ductal carcinoma, grade 2, now HER-2 positive, still estrogen and progesterone receptor negative, with an MIB-1 of 1%  (2) adjuvant chemotherapy consisting of paclitaxel x12 with trastuzumab given weekly started 07/27/2018  (a) paclitaxel discontinued after 3 doses because of neuropathy-- last dose 08/09/2018  (3) trastuzumab to continue to total 1 year (through December 2020)  (a) echocardiogram 07/22/2018 showed an ejection fraction in the 55-60% range.  (3) adjuvant radiation completed on 10/15/2018   PLAN: Rachel Kelly is doing well today.  She has no clinical sign of recurrence.  She will continue to receive Trastuzumab every 3 weeks and she is tolerating it well.  I am delighted she tolerated radiation well.    She is due for an echocardiogram and I have placed orders for that today.  She was recommended healthy diet and exercise.    Return to clinic in 3 weeks for labs/f/u with [name].    A total of (20) minutes of face-to-face time was spent with this patient with greater than 50% of that time in counseling and care-coordination.     10/25/18 1:32 PM Scot Dock, NP Medical Oncology and Hematology [facility] [address] [place], [place] [address] Tel. [phone]    Fax. [phone]

## 2018-10-25 NOTE — Patient Instructions (Signed)
Coronavirus (COVID-19) Are you at risk?  Are you at risk for the Coronavirus (COVID-19)?  To be considered HIGH RISK for Coronavirus (COVID-19), you have to meet the following criteria:  . Traveled to [place], [place], [place], [place] or [place]; or in the [place] to [place], [place], [place], or [place]; and have fever, cough, and shortness of breath within the last 2 weeks of travel OR . Been in close contact with a person diagnosed with COVID-19 within the last 2 weeks and have fever, cough, and shortness of breath . IF YOU DO NOT MEET THESE CRITERIA, YOU ARE CONSIDERED LOW RISK FOR COVID-19.  What to do if you are HIGH RISK for COVID-19?  . If you are having a medical emergency, call 911. . Seek medical care right away. Before you go to a doctor's office, urgent care or emergency department, call ahead and tell them about your recent travel, contact with someone diagnosed with COVID-19, and your symptoms. You should receive instructions from your physician's office regarding next steps of care.  . When you arrive at healthcare provider, tell the healthcare staff immediately you have returned from visiting [place], [place], [place], [place] or [place]; or traveled in the [place] to [place], [place], [place], or [place]; in the last two weeks or you have been in close contact with a person diagnosed with COVID-19 in the last 2 weeks.   . Tell the health care staff about your symptoms: fever, cough and shortness of breath. . After you have been seen by a medical provider, you will be either: o Tested for (COVID-19) and discharged home on quarantine except to seek medical care if symptoms worsen, and asked to  - Stay home and avoid contact with others until you get your results (4-5 days)  - Avoid travel on public transportation if possible (such as bus, train, or airplane) or o Sent to the Emergency Department by EMS for evaluation, COVID-19 testing, and possible  admission depending on your condition and test results.  What to do if you are LOW RISK for COVID-19?  Reduce your risk of any infection by using the same precautions used for avoiding the common cold or flu:  . Wash your hands often with soap and warm water for at least 20 seconds.  If soap and water are not readily available, use an alcohol-based hand sanitizer with at least 60% alcohol.  . If coughing or sneezing, cover your mouth and nose by coughing or sneezing into the elbow areas of your shirt or coat, into a tissue or into your sleeve (not your hands). . Avoid shaking hands with others and consider head nods or verbal greetings only. . Avoid touching your eyes, nose, or mouth with unwashed hands.  . Avoid close contact with people who are sick. . Avoid places or events with large numbers of people in one location, like concerts or sporting events. . Carefully consider travel plans you have or are making. . If you are planning any travel outside or inside the [place], visit the CDC's Travelers' Health webpage for the latest health notices. . If you have some symptoms but not all symptoms, continue to monitor at home and seek medical attention if your symptoms worsen. . If you are having a medical emergency, call 911.  ADDITIONAL HEALTHCARE OPTIONS FOR PATIENTS  [facility] / e-Visit: [email or web address]         [facility]: [phone]  [facility]: [phone]                   [facility]: [phone]   [facility] Discharge Instructions for Patients Receiving Chemotherapy  Today you received the following chemotherapy agents: Trastuzumab (Herceptin)  To help prevent nausea and vomiting after your treatment, we encourage you to take your nausea medication as directed.    If you develop nausea and vomiting that is not controlled by your nausea medication, call the clinic.    BELOW ARE SYMPTOMS THAT SHOULD BE REPORTED IMMEDIATELY:  *FEVER GREATER THAN 100.5 F  *CHILLS WITH OR WITHOUT FEVER  NAUSEA AND VOMITING THAT IS NOT CONTROLLED WITH YOUR NAUSEA MEDICATION  *UNUSUAL SHORTNESS OF BREATH  *UNUSUAL BRUISING OR BLEEDING  TENDERNESS IN MOUTH AND THROAT WITH OR WITHOUT PRESENCE OF ULCERS  *URINARY PROBLEMS  *BOWEL PROBLEMS  UNUSUAL RASH Items with * indicate a potential emergency and should be followed up as soon as possible.  Feel free to call the clinic should you have any questions or concerns. The clinic phone number is [phone].  Please show the CHEMO ALERT CARD at check-in to the Emergency Department and triage nurse.   

## 2018-10-26 ENCOUNTER — Ambulatory Visit: Payer: Medicare Other

## 2018-10-27 ENCOUNTER — Ambulatory Visit: Payer: Medicare Other

## 2018-10-27 ENCOUNTER — Ambulatory Visit: Payer: Medicare Other | Admitting: Rehabilitation

## 2018-11-03 ENCOUNTER — Encounter: Payer: Medicare Other | Admitting: Rehabilitation

## 2018-11-12 NOTE — Progress Notes (Signed)
[facility]  [phone] [phone]     ID: Rachel Kelly DOB: [birth year]  MR#: [id number]  GMW#:[id number]  Patient Care Team: [name], MD as PCP - General (Family Medicine) Deneise Lever, MD as Referring Physician (Pulmonary Disease) [name], MD as Consulting Physician (General Surgery) Drusilla Wampole, [name], MD as Consulting Physician (Oncology) Eppie Gibson, MD as Attending Physician (Radiation Oncology) Chelsea Aus, DDS as Consulting Physician (Dentistry) Harriett Sine, MD as Consulting Physician (Dermatology) Bensimhon, Shaune Pascal, MD as Consulting Physician (Cardiology) OTHER MD:  CHIEF COMPLAINT: HER-2 positive, estrogen receptor negative breast cancer  CURRENT TREATMENT: anti-HER-2 immunotherapy  INTERVAL HISTORY: Rachel Kelly returns today for follow-up and treatment of her HER-2 positive breast cancer. She is unaccompanied today.    She continues on trastuzumab every 3 weeks. She tolerates this well with no noticeable side effects.  Her most recent echocardiogram was on 07/22/2018 with EF of 55 - 60%. A repeat exam has been ordered but not scheduled.  Rachel Kelly completed adjuvant radiation on 10/15/2018 and tolerated it very well. She denies fatigue. She experienced hyperpigmentation but notes this is improving.   REVIEW OF SYSTEMS: Rachel Kelly reports she continues to walk daily. Her hair is growing back nicely. She denies any changes to her breasts, but she notes continued shooting pains to her breast, as expected. The patient denies unusual headaches, visual changes, nausea, vomiting, stiff neck, dizziness, or gait imbalance. There has been no cough, phlegm production, or pleurisy, no chest pain or pressure, and no change in bowel or bladder habits. The patient denies fever, rash, bleeding, unexplained fatigue or unexplained weight loss. A detailed review of systems was otherwise entirely negative.   HISTORY OF CURRENT ILLNESS: From  the original intake note:  Rachel Kelly had routine screening mammography on 05/26/2018 showing a possible abnormality in the right breast. She underwent bilateral diagnostic mammography with tomography and right breast ultrasonography at [facility] on 06/02/2018 showing: breast density category C, suspicious mass at the 10 o'clock position 8 cm from the nipple. It measures 10 x 7 x 6 mm. Just adjacent to this mass, there is an irregular hypoechoic mass with punctate echogenic foci. It measures 9 x 8 x 3 mm. Additional indeterminate mass at the 9 o'clock position 6 cm from the nipple. It measures 6 x 4 x 4 mm. No suspicious right axillary lymphadenopathy.  Accordingly on 06/02/2018 she proceeded to biopsy of two of the right breast areas in question. The pathology from this procedure showed [phone]): at the 10 o'clock position, invasive ductal carcinoma, grade 2, estrogen and progesterone receptor negative, HER-2 equivocal by immunohistochemistry at 2+, but negative by [facility] with a signals ratio of 1.47 and number per cell 2.20.  Biopsy of the right breast lesion at the 9 o'clock position showed a sclerotic lesion with calcifications and negative for carcinoma.  The patient's subsequent history is as detailed below.   PAST MEDICAL HISTORY: Past Medical History:  Diagnosis Date   Anemia    Blood transfusion 2005   Hernia    HH (hiatus hernia)    Hyperlipidemia    Hypertension    PAH (pulmonary artery hypertension) ([place])    Sarcoidosis    Shortness of breath    on exertion   Sleep apnea    does not use cpap   Wheezing     PAST SURGICAL HISTORY: Past Surgical History:  Procedure Laterality Date   ABDOMINAL HYSTERECTOMY  1988   BACK SURGERY  2005  lower   BREAST LUMPECTOMY WITH RADIOACTIVE SEED AND SENTINEL LYMPH NODE BIOPSY Right 07/02/2018   Procedure: RIGHT BREAST RADIOACTIVE SEED LUMPECTOMY X2 AND RIGHT AXILLARY  SENTINEL LYMPH NODE BIOPSY;  Surgeon:  [name], MD;  Location: [facility];  Service: General;  Laterality: Right;   ESOPHAGOGASTRODUODENOSCOPY  06/24/2012   Procedure: ESOPHAGOGASTRODUODENOSCOPY (EGD);  Surgeon: [name], MD;  Location: [name];  Service: General;  Laterality: N/A;   HIATAL HERNIA REPAIR  08/18/2012   Procedure: LAPAROSCOPIC REPAIR OF HIATAL HERNIA;  Surgeon: [name], MD;  Location: [facility];  Service: General;  Laterality: N/A;  Laparoscopic Hiatal Hernia Repair    INSERTION OF MESH  08/18/2012   Procedure: INSERTION OF MESH;  Surgeon: [name], MD;  Location: [facility];  Service: General;;   IR IMAGING GUIDED PORT INSERTION  07/19/2018   PORTACATH PLACEMENT N/A 07/02/2018   Procedure: INSERTION PORT-A-CATH TRIED NO PORT IN PLACE;  Surgeon: [name], MD;  Location: [facility];  Service: General;  Laterality: N/A;   VESICOVAGINAL FISTULA CLOSURE W/ TAH      FAMILY HISTORY Family History  Problem Relation Age of Onset   Clotting disorder Mother        PE   Heart disease Father    Cancer Maternal Grandmother        not sure type, but spread to the brain.   Coronary artery disease Other    Cancer Other        ? type   She notes that her father died from heart disease at age 52. Patients' mother lives in a nursing home and is 73 (as of November 2019)-- she has a form of dementia. The patient has 3 brothers and 0 sisters. A maternal aunt had a form of cancer, not clear what it was.  The maternal grandmother had breast cancer, but was never treated because of her old age.  The patient does not think anyone in her family had ovarian cancer, but there are 2 others in her family that had colon cancer.    GYNECOLOGIC HISTORY:  No LMP recorded. Patient has had a hysterectomy. 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 She lives alone. She does not have any pets.  Her daughter Deatra Canter "Cantrece" Grandville Silos, 48, lives in [place] and works in Psychologist, educational. No grandchildren.  The patient belongs to [place]: In place, but she can't remember who her power of attorney is (believes it is her daughter).   HEALTH MAINTENANCE: Social History   Tobacco Use   Smoking status: Never Smoker   Smokeless tobacco: Never Used  Substance Use Topics   Alcohol use: No   Drug use: No    Colonoscopy: Dr. Collene Mares  PAP: yes, but not sure when  Bone density: No   Allergies  Allergen Reactions   Penicillins Rash    Fine red bumps that spread all over the body. Has patient had a PCN reaction causing immediate rash, facial/tongue/throat swelling, SOB or lightheadedness with hypotension: No Has patient had a PCN reaction causing severe rash involving mucus membranes or skin necrosis: No Has patient had a PCN reaction that required hospitalization: No Has patient had a PCN reaction occurring within the last 10 years:  No If all of the above answers are "NO", then may proceed with Cephalosporin use.    Skin Adhesives [Cyanoacrylate] Itching and Rash    Dermabond    Current Outpatient Medications  Medication Sig Dispense Refill   Ascorbic Acid (VITAMIN C) 1000 MG tablet Take 1,000 mg by mouth every other day.      Cholecalciferol (VITAMIN D) 2000 UNITS CAPS Take 2,000 Units by mouth daily.      Coenzyme Q10 200 MG TABS Take 200 mg by mouth daily.     hydrocortisone 2.5 % cream Apply 1 application topically 2 (two) times daily as needed (dry skin).     ibuprofen (ADVIL,MOTRIN) 200 MG tablet Take 400 mg by mouth every 6 (six) hours as needed for headache or moderate pain.     ketotifen (ALAWAY) 0.025 % ophthalmic solution Place 1 drop into both eyes 2 (two) times daily.     loperamide (IMODIUM A-D) 2 MG capsule Take 2 mg  by mouth daily as needed for diarrhea or loose stools.     omeprazole (PRILOSEC) 20 MG capsule Take 20 mg by mouth daily.      phenazopyridine (PYRIDIUM) 100 MG tablet Take 1 tablet (100 mg total) by mouth 3 (three) times daily as needed for pain. 10 tablet 0   Polyvinyl Alcohol-Povidone PF (REFRESH) 1.4-0.6 % SOLN Place 1 drop into both eyes daily as needed (dry eyes).      potassium gluconate 595 MG TABS Take 595 mg by mouth daily.      simvastatin (ZOCOR) 20 MG tablet Take 20 mg by mouth every morning.      sulfamethoxazole-trimethoprim (BACTRIM DS,SEPTRA DS) 800-160 MG tablet Take 1 tablet by mouth 2 (two) times daily. 14 tablet 0   traMADol (ULTRAM) 50 MG tablet Take 1 tablet (50 mg total) by mouth every 6 (six) hours as needed. 15 tablet 1   triamterene-hydrochlorothiazide (MAXZIDE-25) 37.5-25 MG per tablet Take 1 tablet by mouth every morning.      No current facility-administered medications for this visit.     OBJECTIVE: Older African-American Kelly who appears well  Vitals:   11/15/18 0840  BP: 110/70  Pulse: 73  Resp: 18  Temp: 98.2 F (36.8 C)  SpO2: 100%     Body mass index is 33.45 kg/m.   Wt Readings from Last 3 Encounters:  11/15/18 165 lb 9.6 oz (75.1 kg)  10/25/18 165 lb 11.2 oz (75.2 kg)  09/13/18 168 lb 1.6 oz (76.2 kg)  ECOG FS:1 - Symptomatic but completely ambulatory  Sclerae unicteric, EOMs intact Oropharynx clear and moist No cervical or supraclavicular adenopathy Lungs no rales or rhonchi Heart regular rate and rhythm Abd soft, nontender, positive bowel sounds MSK no focal spinal tenderness, no upper extremity lymphedema Neuro: nonfocal, well oriented, appropriate affect Breasts: The right breast is status post surgery and radiation.  There is mild hyperpigmentation.  The cosmetic result is good.  There is no evidence of disease recurrence or residual disease.  Left breast is benign.  Both axillae are benign.   LAB RESULTS:  CMP       Component Value Date/Time   NA 142 10/25/2018 1248   K 3.9 10/25/2018 1248   CL 109 10/25/2018 1248   CO2 25 10/25/2018 1248   GLUCOSE 96 10/25/2018 1248   BUN 21 10/25/2018 1248   CREATININE 1.15 (H) 10/25/2018 1248   CREATININE 1.07 (H) 10/04/2018 1221   CALCIUM 9.0 10/25/2018 1248   PROT 6.8 10/25/2018 1248  ALBUMIN 3.5 10/25/2018 1248   AST 20 10/25/2018 1248   AST 24 10/04/2018 1221   ALT 12 10/25/2018 1248   ALT 15 10/04/2018 1221   ALKPHOS 67 10/25/2018 1248   BILITOT 0.3 10/25/2018 1248   BILITOT 0.6 10/04/2018 1221   GFRNONAA 47 (L) 10/25/2018 1248   GFRNONAA 51 (L) 10/04/2018 1221   GFRAA 54 (L) 10/25/2018 1248   GFRAA 59 (L) 10/04/2018 1221    No results found for: TOTALPROTELP, ALBUMINELP, A1GS, A2GS, BETS, BETA2SER, GAMS, MSPIKE, SPEI  No results found for: KPAFRELGTCHN, LAMBDASER, KAPLAMBRATIO  Lab Results  Component Value Date   WBC 5.3 11/15/2018   NEUTROABS 3.4 11/15/2018   HGB 12.0 11/15/2018   HCT 37.4 11/15/2018   MCV 92.8 11/15/2018   PLT 179 11/15/2018    _0 @  No results found for: LABCA2  No components found for: LKTGYB638  No results for input(s): INR in the last 168 hours.  No results found for: LABCA2  No results found for: LHT342  No results found for: AJG811  No results found for: XBW620  No results found for: CA2729  No components found for: HGQUANT  No results found for: CEA1 / No results found for: CEA1   No results found for: AFPTUMOR  No results found for: CHROMOGRNA  No results found for: PSA1  Appointment on 11/15/2018  Component Date Value Ref Range Status   WBC 11/15/2018 5.3  4.0 - 10.5 K/uL Final   RBC 11/15/2018 4.03  3.87 - 5.11 MIL/uL Final   Hemoglobin 11/15/2018 12.0  12.0 - 15.0 g/dL Final   HCT 11/15/2018 37.4  36.0 - 46.0 % Final   MCV 11/15/2018 92.8  80.0 - 100.0 fL Final   MCH 11/15/2018 29.8  26.0 - 34.0 pg Final   MCHC 11/15/2018 32.1  30.0 - 36.0 g/dL Final   RDW  11/15/2018 13.2  11.5 - 15.5 % Final   Platelets 11/15/2018 179  150 - 400 K/uL Final   nRBC 11/15/2018 0.0  0.0 - 0.2 % Final   Neutrophils Relative % 11/15/2018 64  % Final   Neutro Abs 11/15/2018 3.4  1.7 - 7.7 K/uL Final   Lymphocytes Relative 11/15/2018 24  % Final   Lymphs Abs 11/15/2018 1.3  0.7 - 4.0 K/uL Final   Monocytes Relative 11/15/2018 10  % Final   Monocytes Absolute 11/15/2018 0.6  0.1 - 1.0 K/uL Final   Eosinophils Relative 11/15/2018 2  % Final   Eosinophils Absolute 11/15/2018 0.1  0.0 - 0.5 K/uL Final   Basophils Relative 11/15/2018 0  % Final   Basophils Absolute 11/15/2018 0.0  0.0 - 0.1 K/uL Final   Immature Granulocytes 11/15/2018 0  % Final   Abs Immature Granulocytes 11/15/2018 0.02  0.00 - 0.07 K/uL Final   Performed at [facility], [address] [place], [place] [address]    (this displays the last labs from the last 3 days)  No results found for: TOTALPROTELP, ALBUMINELP, A1GS, A2GS, BETS, BETA2SER, GAMS, MSPIKE, SPEI (this displays SPEP labs)  No results found for: KPAFRELGTCHN, LAMBDASER, KAPLAMBRATIO (kappa/lambda light chains)  No results found for: HGBA, HGBA2QUANT, HGBFQUANT, HGBSQUAN (Hemoglobinopathy evaluation)   No results found for: LDH  No results found for: IRON, TIBC, IRONPCTSAT (Iron and TIBC)  No results found for: FERRITIN  Urinalysis    Component Value Date/Time   COLORURINE STRAW (A) 07/27/2018 1050   APPEARANCEUR CLEAR 07/27/2018 1050   LABSPEC 1.011 07/27/2018 1050  PHURINE 6.0 07/27/2018 1050   GLUCOSEU NEGATIVE 07/27/2018 1050   HGBUR SMALL (A) 07/27/2018 1050   BILIRUBINUR NEGATIVE 07/27/2018 1050   KETONESUR NEGATIVE 07/27/2018 1050   PROTEINUR NEGATIVE 07/27/2018 1050   UROBILINOGEN 1.0 11/21/2012 1402   NITRITE NEGATIVE 07/27/2018 1050   LEUKOCYTESUR MODERATE (A) 07/27/2018 1050     STUDIES:  She is due for an echocardiogram anytime we can get that done  ELIGIBLE  FOR AVAILABLE RESEARCH PROTOCOL: PREVENT  ASSESSMENT: 73 y.o. Rachel Kelly status post right breast upper outer quadrant biopsy 06/02/2018 for a clinical T1b N0, stage IB invasive ductal carcinoma, grade 2, triple negative, with an MIB-1 of 10%  (1) status post right lumpectomy and sentinel lymph node sampling 07/02/2018 for a pT1b pN0,stage IA invasive ductal carcinoma, grade 2, now HER-2 positive, still estrogen and progesterone receptor negative, with an MIB-1 of 1%  (2) adjuvant chemotherapy consisting of paclitaxel x12 with trastuzumab given weekly started 07/27/2018  (a) paclitaxel discontinued after 3 doses because of neuropathy-- last dose 08/09/2018  (3) trastuzumab to continue to total 1 year (through December 2020)  (a) echocardiogram 07/22/2018 showed an ejection fraction in the 55-60% range.  (3) adjuvant radiation completed on 10/15/2018   PLAN: Zalaya has completed all her treatment except of course for the trastuzumab which she will continue to receive every 3 weeks through December of this year.  Normally we would have repeated an echocardiogram but with the pandemic that has been delayed.  Whenever it is possible to get that done she will have that done.  At this point there are no symptoms suggestive of heart failure issues  I have encouraged her to take walks for exercise.  She is following appropriate pandemic precautions  She will see me again in 3 months.  We will do mammography in the fall.  She knows to call for any other issues that may develop before the next visit.    11/15/18 9:02 AM Chauncey Cruel, MD Medical Oncology and Hematology [facility] [address] [place], [place] [address] Tel. [phone]    Fax. [phone]   I, Wilburn Mylar, am acting as scribe for Dr. [name]. Jeniel Slauson.  I, Lurline Del MD, have reviewed the above documentation for accuracy and completeness, and I agree with the above.

## 2018-11-15 ENCOUNTER — Inpatient Hospital Stay: Payer: Medicare Other

## 2018-11-15 ENCOUNTER — Other Ambulatory Visit: Payer: Self-pay | Admitting: *Deleted

## 2018-11-15 ENCOUNTER — Other Ambulatory Visit: Payer: Self-pay

## 2018-11-15 ENCOUNTER — Telehealth: Payer: Self-pay | Admitting: *Deleted

## 2018-11-15 ENCOUNTER — Inpatient Hospital Stay (HOSPITAL_BASED_OUTPATIENT_CLINIC_OR_DEPARTMENT_OTHER): Payer: Medicare Other | Admitting: Oncology

## 2018-11-15 VITALS — BP 110/70 | HR 73 | Temp 98.2°F | Resp 18 | Ht 59.0 in | Wt 165.6 lb

## 2018-11-15 DIAGNOSIS — Z171 Estrogen receptor negative status [ER-]: Principal | ICD-10-CM

## 2018-11-15 DIAGNOSIS — Z803 Family history of malignant neoplasm of breast: Secondary | ICD-10-CM

## 2018-11-15 DIAGNOSIS — C50411 Malignant neoplasm of upper-outer quadrant of right female breast: Secondary | ICD-10-CM

## 2018-11-15 DIAGNOSIS — Z95828 Presence of other vascular implants and grafts: Secondary | ICD-10-CM

## 2018-11-15 DIAGNOSIS — Z8041 Family history of malignant neoplasm of ovary: Secondary | ICD-10-CM

## 2018-11-15 DIAGNOSIS — Z79899 Other long term (current) drug therapy: Secondary | ICD-10-CM

## 2018-11-15 DIAGNOSIS — Z809 Family history of malignant neoplasm, unspecified: Secondary | ICD-10-CM

## 2018-11-15 DIAGNOSIS — Z800 Family history of malignant neoplasm of digestive organs: Secondary | ICD-10-CM

## 2018-11-15 DIAGNOSIS — Z5112 Encounter for antineoplastic immunotherapy: Secondary | ICD-10-CM | POA: Diagnosis not present

## 2018-11-15 LAB — CBC WITH DIFFERENTIAL/PLATELET
Abs Immature Granulocytes: 0.02 10*3/uL (ref 0.00–0.07)
Basophils Absolute: 0 10*3/uL (ref 0.0–0.1)
Basophils Relative: 0 %
Eosinophils Absolute: 0.1 10*3/uL (ref 0.0–0.5)
Eosinophils Relative: 2 %
HCT: 37.4 % (ref 36.0–46.0)
Hemoglobin: 12 g/dL (ref 12.0–15.0)
Immature Granulocytes: 0 %
Lymphocytes Relative: 24 %
Lymphs Abs: 1.3 10*3/uL (ref 0.7–4.0)
MCH: 29.8 pg (ref 26.0–34.0)
MCHC: 32.1 g/dL (ref 30.0–36.0)
MCV: 92.8 fL (ref 80.0–100.0)
Monocytes Absolute: 0.6 10*3/uL (ref 0.1–1.0)
Monocytes Relative: 10 %
Neutro Abs: 3.4 10*3/uL (ref 1.7–7.7)
Neutrophils Relative %: 64 %
Platelets: 179 10*3/uL (ref 150–400)
RBC: 4.03 MIL/uL (ref 3.87–5.11)
RDW: 13.2 % (ref 11.5–15.5)
WBC: 5.3 10*3/uL (ref 4.0–10.5)
nRBC: 0 % (ref 0.0–0.2)

## 2018-11-15 LAB — COMPREHENSIVE METABOLIC PANEL
ALT: 13 U/L (ref 0–44)
AST: 20 U/L (ref 15–41)
Albumin: 3.6 g/dL (ref 3.5–5.0)
Alkaline Phosphatase: 67 U/L (ref 38–126)
Anion gap: 10 (ref 5–15)
BUN: 18 mg/dL (ref 8–23)
CO2: 25 mmol/L (ref 22–32)
Calcium: 9.1 mg/dL (ref 8.9–10.3)
Chloride: 109 mmol/L (ref 98–111)
Creatinine, Ser: 1.25 mg/dL — ABNORMAL HIGH (ref 0.44–1.00)
GFR calc Af Amer: 49 mL/min — ABNORMAL LOW (ref >60)
GFR calc non Af Amer: 42 mL/min — ABNORMAL LOW (ref >60)
Glucose, Bld: 95 mg/dL (ref 70–99)
Potassium: 4 mmol/L (ref 3.5–5.1)
Sodium: 144 mmol/L (ref 135–145)
Total Bilirubin: 0.4 mg/dL (ref 0.3–1.2)
Total Protein: 7.1 g/dL (ref 6.5–8.1)

## 2018-11-15 MED ORDER — SODIUM CHLORIDE 0.9% FLUSH
10.0000 mL | Freq: Once | INTRAVENOUS | Status: AC
  Administered 2018-11-15: 10 mL
  Filled 2018-11-15: qty 10

## 2018-11-15 MED ORDER — SODIUM CHLORIDE 0.9 % IV SOLN
Freq: Once | INTRAVENOUS | Status: AC
  Administered 2018-11-15: 09:00:00 via INTRAVENOUS
  Filled 2018-11-15: qty 250

## 2018-11-15 MED ORDER — DIPHENHYDRAMINE HCL 25 MG PO CAPS
25.0000 mg | ORAL_CAPSULE | Freq: Once | ORAL | Status: AC
  Administered 2018-11-15: 25 mg via ORAL

## 2018-11-15 MED ORDER — TRASTUZUMAB CHEMO 150 MG IV SOLR
450.0000 mg | Freq: Once | INTRAVENOUS | Status: AC
  Administered 2018-11-15: 450 mg via INTRAVENOUS
  Filled 2018-11-15: qty 21.43

## 2018-11-15 MED ORDER — SODIUM CHLORIDE 0.9% FLUSH
10.0000 mL | INTRAVENOUS | Status: DC | PRN
  Administered 2018-11-15: 10 mL
  Filled 2018-11-15: qty 10

## 2018-11-15 MED ORDER — ACETAMINOPHEN 325 MG PO TABS
650.0000 mg | ORAL_TABLET | Freq: Once | ORAL | Status: AC
  Administered 2018-11-15: 650 mg via ORAL

## 2018-11-15 MED ORDER — HEPARIN SOD (PORK) LOCK FLUSH 100 UNIT/ML IV SOLN
500.0000 [IU] | Freq: Once | INTRAVENOUS | Status: AC | PRN
  Administered 2018-11-15: 500 [IU]
  Filled 2018-11-15: qty 5

## 2018-11-15 MED ORDER — ACETAMINOPHEN 325 MG PO TABS
ORAL_TABLET | ORAL | Status: AC
  Filled 2018-11-15: qty 2

## 2018-11-15 MED ORDER — DIPHENHYDRAMINE HCL 25 MG PO CAPS
ORAL_CAPSULE | ORAL | Status: AC
  Filled 2018-11-15: qty 1

## 2018-11-15 NOTE — Telephone Encounter (Signed)
May treat today with ECHO from January 2020 per MD.

## 2018-11-15 NOTE — Patient Instructions (Signed)
[facility] Discharge Instructions for Patients Receiving Chemotherapy  Today you received the following chemotherapy agents: Trastuzumab (Herceptin)  To help prevent nausea and vomiting after your treatment, we encourage you to take your nausea medication as directed.   If you develop nausea and vomiting that is not controlled by your nausea medication, call the clinic.   BELOW ARE SYMPTOMS THAT SHOULD BE REPORTED IMMEDIATELY:  *FEVER GREATER THAN 100.5 F  *CHILLS WITH OR WITHOUT FEVER  NAUSEA AND VOMITING THAT IS NOT CONTROLLED WITH YOUR NAUSEA MEDICATION  *UNUSUAL SHORTNESS OF BREATH  *UNUSUAL BRUISING OR BLEEDING  TENDERNESS IN MOUTH AND THROAT WITH OR WITHOUT PRESENCE OF ULCERS  *URINARY PROBLEMS  *BOWEL PROBLEMS  UNUSUAL RASH Items with * indicate a potential emergency and should be followed up as soon as possible.  Feel free to call the clinic should you have any questions or concerns. The clinic phone number is [phone].  Please show the CHEMO ALERT CARD at check-in to the Emergency Department and triage nurse.  Coronavirus (COVID-19) Are you at risk?  Are you at risk for the Coronavirus (COVID-19)?  To be considered HIGH RISK for Coronavirus (COVID-19), you have to meet the following criteria:  . Traveled to [place], [place], [place], [place] or [place]; or in the [place] to [place], [place], [place], or [place]; and have fever, cough, and shortness of breath within the last 2 weeks of travel OR . Been in close contact with a person diagnosed with COVID-19 within the last 2 weeks and have fever, cough, and shortness of breath . IF YOU DO NOT MEET THESE CRITERIA, YOU ARE CONSIDERED LOW RISK FOR COVID-19.  What to do if you are HIGH RISK for COVID-19?  . If you are having a medical emergency, call 911. . Seek medical care right away. Before you go to a doctor's office, urgent care or emergency department, call ahead and tell them  about your recent travel, contact with someone diagnosed with COVID-19, and your symptoms. You should receive instructions from your physician's office regarding next steps of care.  . When you arrive at healthcare provider, tell the healthcare staff immediately you have returned from visiting [place], [place], [place], [place] or [place]; or traveled in the [place] to [place], [place], [place], or [place]; in the last two weeks or you have been in close contact with a person diagnosed with COVID-19 in the last 2 weeks.   . Tell the health care staff about your symptoms: fever, cough and shortness of breath. . After you have been seen by a medical provider, you will be either: o Tested for (COVID-19) and discharged home on quarantine except to seek medical care if symptoms worsen, and asked to  - Stay home and avoid contact with others until you get your results (4-5 days)  - Avoid travel on public transportation if possible (such as bus, train, or airplane) or o Sent to the Emergency Department by EMS for evaluation, COVID-19 testing, and possible admission depending on your condition and test results.  What to do if you are LOW RISK for COVID-19?  Reduce your risk of any infection by using the same precautions used for avoiding the common cold or flu:  . Wash your hands often with soap and warm water for at least 20 seconds.  If soap and water are not readily available, use an alcohol-based hand sanitizer with at least 60% alcohol.  . If coughing or   sneezing, cover your mouth and nose by coughing or sneezing into the elbow areas of your shirt or coat, into a tissue or into your sleeve (not your hands). . Avoid shaking hands with others and consider head nods or verbal greetings only. . Avoid touching your eyes, nose, or mouth with unwashed hands.  . Avoid close contact with people who are sick. . Avoid places or events with large numbers of people in one location, like concerts or  sporting events. . Carefully consider travel plans you have or are making. . If you are planning any travel outside or inside the [place], visit the CDC's Travelers' Health webpage for the latest health notices. . If you have some symptoms but not all symptoms, continue to monitor at home and seek medical attention if your symptoms worsen. . If you are having a medical emergency, call 911.   ADDITIONAL HEALTHCARE OPTIONS FOR PATIENTS  [facility] / e-Visit: [email or web address]         [facility]: [phone]  [facility]: [phone]                   [facility]: [phone]   

## 2018-11-16 ENCOUNTER — Telehealth: Payer: Self-pay | Admitting: Oncology

## 2018-11-16 NOTE — Progress Notes (Signed)
I called the patient today about her upcoming follow-up appointment in radiation oncology.   Given concerns about the COVID-19 pandemic, I offered a phone assessment with the patient to determine if coming to the clinic was necessary. She accepted.  I let the patient know that I had spoken with Dr. Isidore Moos, and she wanted them to know the importance of washing their hands for at least 20 seconds at a time, especially after going out in public, and before they eat.  Limit going out in public whenever possible. Do not touch your face, unless your hands are clean, such as when bathing. Get plenty of rest, eat well, and stay hydrated.   The patient denies any symptomatic concerns.  Specifically, they report good healing of their skin in the radiation fields.  Skin is intact.    I recommended that she continue skin care by applying oil or lotion with vitamin E to the skin in the radiation fields, BID, for 2 more months.  Continue follow-up with medical oncology. She saw [name] on 11/15/18 and continues Trastuzumab therapy. She will see [name] again in 3 months.  I explained that yearly mammograms are important for patients with intact breast tissue, and physical exams are important after mastectomy for patients that cannot undergo mammography.  I encouraged her to call if she had further questions or concerns about her healing. Otherwise, she will follow-up PRN in radiation oncology. Patient is pleased with this plan, and we will cancel her upcoming follow-up to reduce the risk of COVID-19 transmission.

## 2018-11-16 NOTE — Telephone Encounter (Signed)
Called regarding schedule °

## 2018-11-17 ENCOUNTER — Ambulatory Visit
Admission: RE | Admit: 2018-11-17 | Discharge: 2018-11-17 | Disposition: A | Discharge status: Home / Self Care | Payer: Medicare Other | Source: Ambulatory Visit | Attending: Radiation Oncology | Admitting: Radiation Oncology

## 2018-11-18 ENCOUNTER — Ambulatory Visit: Payer: Medicare Other | Attending: General Surgery | Admitting: Rehabilitation

## 2018-11-18 ENCOUNTER — Other Ambulatory Visit: Payer: Self-pay

## 2018-11-18 DIAGNOSIS — C50411 Malignant neoplasm of upper-outer quadrant of right female breast: Secondary | ICD-10-CM | POA: Diagnosis present

## 2018-11-18 DIAGNOSIS — Z171 Estrogen receptor negative status [ER-]: Secondary | ICD-10-CM | POA: Diagnosis present

## 2018-11-18 DIAGNOSIS — R293 Abnormal posture: Secondary | ICD-10-CM | POA: Diagnosis not present

## 2018-11-18 DIAGNOSIS — R600 Localized edema: Secondary | ICD-10-CM | POA: Diagnosis present

## 2018-11-18 DIAGNOSIS — Z483 Aftercare following surgery for neoplasm: Secondary | ICD-10-CM

## 2018-11-18 NOTE — Therapy (Addendum)
[place], [place], [address] Phone: [phone]   Fax:  [phone]  Physical Therapy Treatment  Patient Details  Name: Rachel Kelly MRN: [id number] Date of Birth: Oct 06, 1945 Referring Provider (PT): Wilber Bihari NP   Encounter Date: 11/18/2018  PT End of Session - 11/18/18 1055    Visit Number  8    Number of Visits  13    PT Start Time  1102    PT Stop Time  1126    PT Time Calculation (min)  24 min    Activity Tolerance  Patient tolerated treatment well    Behavior During Therapy  [facility] for tasks assessed/performed       Past Medical History:  Diagnosis Date  . Anemia   . Blood transfusion 2005  . Hernia   . HH (hiatus hernia)   . Hyperlipidemia   . Hypertension   . PAH (pulmonary artery hypertension) ([place])   . Sarcoidosis   . Shortness of breath    on exertion  . Sleep apnea    does not use cpap  . Wheezing     Past Surgical History:  Procedure Laterality Date  . ABDOMINAL HYSTERECTOMY  1988  . BACK SURGERY  2005   lower  . BREAST LUMPECTOMY WITH RADIOACTIVE SEED AND SENTINEL LYMPH NODE BIOPSY Right 07/02/2018   Procedure: RIGHT BREAST RADIOACTIVE SEED LUMPECTOMY X2 AND RIGHT AXILLARY  SENTINEL LYMPH NODE BIOPSY;  Surgeon: [name], MD;  Location: [facility];  Service: General;  Laterality: Right;  . ESOPHAGOGASTRODUODENOSCOPY  06/24/2012   Procedure: ESOPHAGOGASTRODUODENOSCOPY (EGD);  Surgeon: [name], MD;  Location: [name];  Service: General;  Laterality: N/A;  . HIATAL HERNIA REPAIR  08/18/2012   Procedure: LAPAROSCOPIC REPAIR OF HIATAL HERNIA;  Surgeon: [name], MD;  Location: [facility];  Service: General;  Laterality: N/A;  Laparoscopic Hiatal Hernia Repair   . INSERTION OF MESH  08/18/2012   Procedure: INSERTION OF MESH;  Surgeon: [name], MD;  Location: [facility];  Service: General;;  . IR IMAGING GUIDED PORT INSERTION  07/19/2018  .  PORTACATH PLACEMENT N/A 07/02/2018   Procedure: INSERTION PORT-A-CATH TRIED NO PORT IN PLACE;  Surgeon: [name], MD;  Location: [facility];  Service: General;  Laterality: N/A;  . VESICOVAGINAL FISTULA CLOSURE W/ TAH      There were no vitals filed for this visit.  Subjective Assessment - 11/18/18 1057    Subjective  I am done with radiation now and my skin did well.  I am not sure if I am doing my massage correct yet.  I have some new bras but I don't think they are right.    The swelling is not as bad.      Pertinent History  Rt lumpectomy with SLNB 07/02/18 due to Right sided HER-2 positive, estrogen receptor negative breast cancer. 0/2 nodes positive Able to complete 3/12 chemo doses due to neuropathy. Now on Herceptin every 3 weeks for 1 year. EF 55%.  History of hysterectomy  1988.  Just started radiation on day 3 today.                        [facility] Adult PT Treatment/Exercise - 11/18/18 0001      Manual Therapy   Edema Management  Pt's new bras more like radiation bras and not compression.  Called [employer] and they will contact her to get the right  bras sent over.  They still have her prescription.     Manual Lymphatic Drainage (MLD)  pt with question about hand technique and direction of breast MLD from prior handout.  Reviewed diagonal movement and skin stretch vs sliding.  Performed on patient and patient returned demonstration.  Gave pt instructions on how to get to online video from [facility] for MLD hand placement if needed in the future.                    PT Long Term Goals - 11/18/18 1157      PT LONG TERM GOAL #1   Title  Pt will be independent with self MLD for the Rt breast to maintain throughout radiation    Status  Achieved      PT LONG TERM GOAL #2   Title  Patient will report >/= 25% reduction in the sensitivity around her breast and axillary incisions.    Status  Achieved      PT LONG TERM GOAL #3   Title  Pt will obtain  appropriate compression bra for fibrosis management during and/or after radiation    Status  Partially Met   will be met this week      PT LONG TERM GOAL #4   Title  Patient will demonstrate self manual lymph drainage for management of her edema.    Status  Achieved            Plan - 11/18/18 1154    Clinical Impression Statement  Pt returns to clinic to ask two questions about her MLD sequence and to see if her bras are adequate.  Pt did not receive a compression bra so PT called over to [facility] to get this fixed.  After brief re-education on not sliding across the skin but to stretch and relax in small movements pt feels like all of her questions have been answered.  Pt did well completing radiation and feels good with her self care.  Most likely DC today     Consulted and Agree with Plan of Care  Patient       Patient will benefit from skilled therapeutic intervention in order to improve the following deficits and impairments:     Visit Diagnosis: Abnormal posture  Aftercare following surgery for neoplasm  Localized edema  Malignant neoplasm of upper-outer quadrant of right breast in female, estrogen receptor negative ([facility])     Problem List Patient Active Problem List   Diagnosis Date Noted  . Port-A-Cath in place 08/02/2018  . Pre-op evaluation 06/24/2018  . Malignant neoplasm of upper-outer quadrant of right breast in female, estrogen receptor negative ([place]) 06/07/2018  . Nausea & vomiting 11/21/2012  . Bilateral renal masses 11/21/2012  . Back pain, thoracic 08/14/2012  . Obstructive sleep apnea 05/09/2012  . GERD (gastroesophageal reflux disease) 06/28/2010  . HYPERLIPIDEMIA 12/09/2007  . HYPERTENSION 12/09/2007  . Sarcoidosis 11/26/2007  . DYSPNEA 11/26/2007    Shan Levans, PT 11/18/2018, 11:58 AM  [place], [place], [address] Phone: [phone]   Fax:   [phone]  Name: Rachel Kelly MRN: [id number] Date of Birth: 06-06-1946    PHYSICAL THERAPY DISCHARGE SUMMARY  Visits from Start of Care: 8  Current functional level related to goals / functional outcomes: ind with self MLD   Remaining deficits: Intermittent breast swelling   Education / Equipment: HEP  Plan: Patient agrees to discharge.  Patient goals were met.  Patient is being discharged due to meeting the stated rehab goals.  ?????      Shan Levans, PT

## 2018-11-18 NOTE — Patient Instructions (Signed)
How to access self MLD videos:  Rachel Kelly Training Videos:  [email or web address] (The videos are in the Details section of the page)  -OR-  1. Go to [email or web address]  2.  Scroll down until to you see:  [employer] Exclusive Web designer Lymphedema Management Videos  Watch Free   3. Click on "Watch Free"  4. Scroll down until you see the instructional videos  5. Start with the "Self MLD Intro" video

## 2018-12-02 ENCOUNTER — Encounter: Payer: Self-pay | Admitting: Pharmacist

## 2018-12-06 ENCOUNTER — Inpatient Hospital Stay: Payer: Medicare Other

## 2018-12-06 ENCOUNTER — Other Ambulatory Visit: Payer: Self-pay

## 2018-12-06 ENCOUNTER — Other Ambulatory Visit: Payer: Self-pay | Admitting: Oncology

## 2018-12-06 ENCOUNTER — Inpatient Hospital Stay: Payer: Medicare Other | Attending: Oncology

## 2018-12-06 VITALS — BP 114/80 | HR 64 | Temp 98.5°F | Resp 18 | Wt 164.8 lb

## 2018-12-06 DIAGNOSIS — Z171 Estrogen receptor negative status [ER-]: Secondary | ICD-10-CM

## 2018-12-06 DIAGNOSIS — C50411 Malignant neoplasm of upper-outer quadrant of right female breast: Secondary | ICD-10-CM | POA: Diagnosis present

## 2018-12-06 DIAGNOSIS — Z5112 Encounter for antineoplastic immunotherapy: Secondary | ICD-10-CM | POA: Insufficient documentation

## 2018-12-06 DIAGNOSIS — Z95828 Presence of other vascular implants and grafts: Secondary | ICD-10-CM

## 2018-12-06 LAB — CBC WITH DIFFERENTIAL/PLATELET
Abs Immature Granulocytes: 0.01 10*3/uL (ref 0.00–0.07)
Basophils Absolute: 0 10*3/uL (ref 0.0–0.1)
Basophils Relative: 0 %
Eosinophils Absolute: 0.1 10*3/uL (ref 0.0–0.5)
Eosinophils Relative: 1 %
HCT: 37 % (ref 36.0–46.0)
Hemoglobin: 12 g/dL (ref 12.0–15.0)
Immature Granulocytes: 0 %
Lymphocytes Relative: 31 %
Lymphs Abs: 1.5 10*3/uL (ref 0.7–4.0)
MCH: 29.4 pg (ref 26.0–34.0)
MCHC: 32.4 g/dL (ref 30.0–36.0)
MCV: 90.7 fL (ref 80.0–100.0)
Monocytes Absolute: 0.4 10*3/uL (ref 0.1–1.0)
Monocytes Relative: 9 %
Neutro Abs: 2.8 10*3/uL (ref 1.7–7.7)
Neutrophils Relative %: 59 %
Platelets: 173 10*3/uL (ref 150–400)
RBC: 4.08 MIL/uL (ref 3.87–5.11)
RDW: 13.1 % (ref 11.5–15.5)
WBC: 4.7 10*3/uL (ref 4.0–10.5)
nRBC: 0 % (ref 0.0–0.2)

## 2018-12-06 LAB — CMP (CANCER CENTER ONLY)
ALT: 13 U/L (ref 0–44)
AST: 22 U/L (ref 15–41)
Albumin: 3.7 g/dL (ref 3.5–5.0)
Alkaline Phosphatase: 73 U/L (ref 38–126)
Anion gap: 8 (ref 5–15)
BUN: 21 mg/dL (ref 8–23)
CO2: 27 mmol/L (ref 22–32)
Calcium: 9.1 mg/dL (ref 8.9–10.3)
Chloride: 107 mmol/L (ref 98–111)
Creatinine: 1.19 mg/dL — ABNORMAL HIGH (ref 0.44–1.00)
GFR, Est AFR Am: 52 mL/min — ABNORMAL LOW (ref >60)
GFR, Estimated: 45 mL/min — ABNORMAL LOW (ref >60)
Glucose, Bld: 98 mg/dL (ref 70–99)
Potassium: 3.7 mmol/L (ref 3.5–5.1)
Sodium: 142 mmol/L (ref 135–145)
Total Bilirubin: 0.4 mg/dL (ref 0.3–1.2)
Total Protein: 7.2 g/dL (ref 6.5–8.1)

## 2018-12-06 MED ORDER — SODIUM CHLORIDE 0.9% FLUSH
10.0000 mL | INTRAVENOUS | Status: DC | PRN
  Administered 2018-12-06: 10 mL
  Filled 2018-12-06: qty 10

## 2018-12-06 MED ORDER — SODIUM CHLORIDE 0.9 % IV SOLN
Freq: Once | INTRAVENOUS | Status: AC
  Administered 2018-12-06: 14:00:00 via INTRAVENOUS
  Filled 2018-12-06: qty 250

## 2018-12-06 MED ORDER — HEPARIN SOD (PORK) LOCK FLUSH 100 UNIT/ML IV SOLN
500.0000 [IU] | Freq: Once | INTRAVENOUS | Status: AC | PRN
  Administered 2018-12-06: 500 [IU]
  Filled 2018-12-06: qty 5

## 2018-12-06 MED ORDER — DIPHENHYDRAMINE HCL 25 MG PO CAPS
ORAL_CAPSULE | ORAL | Status: AC
  Filled 2018-12-06: qty 1

## 2018-12-06 MED ORDER — DIPHENHYDRAMINE HCL 25 MG PO CAPS
25.0000 mg | ORAL_CAPSULE | Freq: Once | ORAL | Status: AC
  Administered 2018-12-06: 25 mg via ORAL

## 2018-12-06 MED ORDER — SODIUM CHLORIDE 0.9% FLUSH
10.0000 mL | Freq: Once | INTRAVENOUS | Status: AC
  Administered 2018-12-06: 10 mL
  Filled 2018-12-06: qty 10

## 2018-12-06 MED ORDER — ACETAMINOPHEN 325 MG PO TABS
ORAL_TABLET | ORAL | Status: AC
  Filled 2018-12-06: qty 2

## 2018-12-06 MED ORDER — ACETAMINOPHEN 325 MG PO TABS
650.0000 mg | ORAL_TABLET | Freq: Once | ORAL | Status: AC
  Administered 2018-12-06: 650 mg via ORAL

## 2018-12-06 MED ORDER — TRASTUZUMAB CHEMO 150 MG IV SOLR
450.0000 mg | Freq: Once | INTRAVENOUS | Status: AC
  Administered 2018-12-06: 450 mg via INTRAVENOUS
  Filled 2018-12-06: qty 21.43

## 2018-12-06 MED ORDER — HEPARIN SOD (PORK) LOCK FLUSH 100 UNIT/ML IV SOLN
500.0000 [IU] | Freq: Once | INTRAVENOUS | Status: DC
  Filled 2018-12-06: qty 5

## 2018-12-06 NOTE — Patient Instructions (Signed)
[facility] Discharge Instructions for Patients Receiving Chemotherapy  Today you received the following chemotherapy agents: Trastuzumab (Herceptin)  To help prevent nausea and vomiting after your treatment, we encourage you to take your nausea medication as directed.   If you develop nausea and vomiting that is not controlled by your nausea medication, call the clinic.   BELOW ARE SYMPTOMS THAT SHOULD BE REPORTED IMMEDIATELY:  *FEVER GREATER THAN 100.5 F  *CHILLS WITH OR WITHOUT FEVER  NAUSEA AND VOMITING THAT IS NOT CONTROLLED WITH YOUR NAUSEA MEDICATION  *UNUSUAL SHORTNESS OF BREATH  *UNUSUAL BRUISING OR BLEEDING  TENDERNESS IN MOUTH AND THROAT WITH OR WITHOUT PRESENCE OF ULCERS  *URINARY PROBLEMS  *BOWEL PROBLEMS  UNUSUAL RASH Items with * indicate a potential emergency and should be followed up as soon as possible.  Feel free to call the clinic should you have any questions or concerns. The clinic phone number is [phone].  Please show the CHEMO ALERT CARD at check-in to the Emergency Department and triage nurse.  Coronavirus (COVID-19) Are you at risk?  Are you at risk for the Coronavirus (COVID-19)?  To be considered HIGH RISK for Coronavirus (COVID-19), you have to meet the following criteria:  . Traveled to [place], [place], [place], [place] or [place]; or in the [place] to [place], [place], [place], or [place]; and have fever, cough, and shortness of breath within the last 2 weeks of travel OR . Been in close contact with a person diagnosed with COVID-19 within the last 2 weeks and have fever, cough, and shortness of breath . IF YOU DO NOT MEET THESE CRITERIA, YOU ARE CONSIDERED LOW RISK FOR COVID-19.  What to do if you are HIGH RISK for COVID-19?  . If you are having a medical emergency, call 911. . Seek medical care right away. Before you go to a doctor's office, urgent care or emergency department, call ahead and tell them  about your recent travel, contact with someone diagnosed with COVID-19, and your symptoms. You should receive instructions from your physician's office regarding next steps of care.  . When you arrive at healthcare provider, tell the healthcare staff immediately you have returned from visiting [place], [place], [place], [place] or [place]; or traveled in the [place] to [place], [place], [place], or [place]; in the last two weeks or you have been in close contact with a person diagnosed with COVID-19 in the last 2 weeks.   . Tell the health care staff about your symptoms: fever, cough and shortness of breath. . After you have been seen by a medical provider, you will be either: o Tested for (COVID-19) and discharged home on quarantine except to seek medical care if symptoms worsen, and asked to  - Stay home and avoid contact with others until you get your results (4-5 days)  - Avoid travel on public transportation if possible (such as bus, train, or airplane) or o Sent to the Emergency Department by EMS for evaluation, COVID-19 testing, and possible admission depending on your condition and test results.  What to do if you are LOW RISK for COVID-19?  Reduce your risk of any infection by using the same precautions used for avoiding the common cold or flu:  . Wash your hands often with soap and warm water for at least 20 seconds.  If soap and water are not readily available, use an alcohol-based hand sanitizer with at least 60% alcohol.  . If coughing or   sneezing, cover your mouth and nose by coughing or sneezing into the elbow areas of your shirt or coat, into a tissue or into your sleeve (not your hands). . Avoid shaking hands with others and consider head nods or verbal greetings only. . Avoid touching your eyes, nose, or mouth with unwashed hands.  . Avoid close contact with people who are sick. . Avoid places or events with large numbers of people in one location, like concerts or  sporting events. . Carefully consider travel plans you have or are making. . If you are planning any travel outside or inside the [place], visit the CDC's Travelers' Health webpage for the latest health notices. . If you have some symptoms but not all symptoms, continue to monitor at home and seek medical attention if your symptoms worsen. . If you are having a medical emergency, call 911.   ADDITIONAL HEALTHCARE OPTIONS FOR PATIENTS  [facility] / e-Visit: [email or web address]         [facility]: [phone]  [facility]: [phone]                   [facility]: [phone]   

## 2018-12-06 NOTE — Progress Notes (Signed)
Per [name], ok to treat with echo from 07/22/18. He will look into scheduling her for her next echo.

## 2018-12-07 ENCOUNTER — Telehealth: Payer: Self-pay | Admitting: Oncology

## 2018-12-07 NOTE — Telephone Encounter (Signed)
Called to schedule echo for patient per sch msg. Called patient to inform of appt date and time. No answer. Left message

## 2018-12-09 ENCOUNTER — Encounter: Payer: Self-pay | Admitting: *Deleted

## 2018-12-14 ENCOUNTER — Ambulatory Visit (HOSPITAL_COMMUNITY)
Admission: RE | Admit: 2018-12-14 | Discharge: 2018-12-14 | Disposition: A | Discharge status: Home / Self Care | Payer: Medicare Other | Source: Ambulatory Visit | Attending: Oncology | Admitting: Oncology

## 2018-12-14 ENCOUNTER — Other Ambulatory Visit: Payer: Self-pay

## 2018-12-14 DIAGNOSIS — I351 Nonrheumatic aortic (valve) insufficiency: Secondary | ICD-10-CM | POA: Insufficient documentation

## 2018-12-14 DIAGNOSIS — Z171 Estrogen receptor negative status [ER-]: Secondary | ICD-10-CM | POA: Insufficient documentation

## 2018-12-14 DIAGNOSIS — C50411 Malignant neoplasm of upper-outer quadrant of right female breast: Secondary | ICD-10-CM | POA: Diagnosis not present

## 2018-12-14 DIAGNOSIS — I358 Other nonrheumatic aortic valve disorders: Secondary | ICD-10-CM

## 2018-12-14 DIAGNOSIS — E785 Hyperlipidemia, unspecified: Secondary | ICD-10-CM | POA: Diagnosis not present

## 2018-12-14 DIAGNOSIS — I10 Essential (primary) hypertension: Secondary | ICD-10-CM | POA: Diagnosis not present

## 2018-12-14 NOTE — Progress Notes (Signed)
Echocardiogram 2D Echocardiogram has been performed.  Rachel Kelly 12/14/2018, 10:33 AM

## 2018-12-27 ENCOUNTER — Other Ambulatory Visit: Payer: Self-pay

## 2018-12-27 ENCOUNTER — Inpatient Hospital Stay: Payer: Medicare Other

## 2018-12-27 ENCOUNTER — Inpatient Hospital Stay: Payer: Medicare Other | Attending: Oncology

## 2018-12-27 VITALS — BP 134/83 | HR 77 | Temp 98.7°F | Resp 18 | Wt 166.8 lb

## 2018-12-27 DIAGNOSIS — Z171 Estrogen receptor negative status [ER-]: Secondary | ICD-10-CM

## 2018-12-27 DIAGNOSIS — Z5112 Encounter for antineoplastic immunotherapy: Secondary | ICD-10-CM | POA: Insufficient documentation

## 2018-12-27 DIAGNOSIS — C50411 Malignant neoplasm of upper-outer quadrant of right female breast: Secondary | ICD-10-CM

## 2018-12-27 DIAGNOSIS — Z95828 Presence of other vascular implants and grafts: Secondary | ICD-10-CM

## 2018-12-27 LAB — COMPREHENSIVE METABOLIC PANEL
ALT: 10 U/L (ref 0–44)
AST: 19 U/L (ref 15–41)
Albumin: 3.8 g/dL (ref 3.5–5.0)
Alkaline Phosphatase: 73 U/L (ref 38–126)
Anion gap: 9 (ref 5–15)
BUN: 19 mg/dL (ref 8–23)
CO2: 24 mmol/L (ref 22–32)
Calcium: 9.1 mg/dL (ref 8.9–10.3)
Chloride: 108 mmol/L (ref 98–111)
Creatinine, Ser: 1.18 mg/dL — ABNORMAL HIGH (ref 0.44–1.00)
GFR calc Af Amer: 53 mL/min — ABNORMAL LOW (ref >60)
GFR calc non Af Amer: 45 mL/min — ABNORMAL LOW (ref >60)
Glucose, Bld: 94 mg/dL (ref 70–99)
Potassium: 3.7 mmol/L (ref 3.5–5.1)
Sodium: 141 mmol/L (ref 135–145)
Total Bilirubin: 0.5 mg/dL (ref 0.3–1.2)
Total Protein: 7.2 g/dL (ref 6.5–8.1)

## 2018-12-27 LAB — CBC WITH DIFFERENTIAL/PLATELET
Abs Immature Granulocytes: 0.02 10*3/uL (ref 0.00–0.07)
Basophils Absolute: 0 10*3/uL (ref 0.0–0.1)
Basophils Relative: 0 %
Eosinophils Absolute: 0.1 10*3/uL (ref 0.0–0.5)
Eosinophils Relative: 2 %
HCT: 37.7 % (ref 36.0–46.0)
Hemoglobin: 12.1 g/dL (ref 12.0–15.0)
Immature Granulocytes: 0 %
Lymphocytes Relative: 28 %
Lymphs Abs: 1.4 10*3/uL (ref 0.7–4.0)
MCH: 29.2 pg (ref 26.0–34.0)
MCHC: 32.1 g/dL (ref 30.0–36.0)
MCV: 91.1 fL (ref 80.0–100.0)
Monocytes Absolute: 0.5 10*3/uL (ref 0.1–1.0)
Monocytes Relative: 10 %
Neutro Abs: 3 10*3/uL (ref 1.7–7.7)
Neutrophils Relative %: 60 %
Platelets: 187 10*3/uL (ref 150–400)
RBC: 4.14 MIL/uL (ref 3.87–5.11)
RDW: 13.2 % (ref 11.5–15.5)
WBC: 5.1 10*3/uL (ref 4.0–10.5)
nRBC: 0 % (ref 0.0–0.2)

## 2018-12-27 MED ORDER — ACETAMINOPHEN 325 MG PO TABS
ORAL_TABLET | ORAL | Status: AC
  Filled 2018-12-27: qty 2

## 2018-12-27 MED ORDER — SODIUM CHLORIDE 0.9% FLUSH
10.0000 mL | Freq: Once | INTRAVENOUS | Status: AC
  Administered 2018-12-27: 10 mL
  Filled 2018-12-27: qty 10

## 2018-12-27 MED ORDER — SODIUM CHLORIDE 0.9% FLUSH
10.0000 mL | INTRAVENOUS | Status: DC | PRN
  Administered 2018-12-27: 10 mL
  Filled 2018-12-27: qty 10

## 2018-12-27 MED ORDER — SODIUM CHLORIDE 0.9 % IV SOLN
Freq: Once | INTRAVENOUS | Status: AC
  Administered 2018-12-27: 13:00:00 via INTRAVENOUS
  Filled 2018-12-27: qty 250

## 2018-12-27 MED ORDER — TRASTUZUMAB CHEMO 150 MG IV SOLR
450.0000 mg | Freq: Once | INTRAVENOUS | Status: AC
  Administered 2018-12-27: 450 mg via INTRAVENOUS
  Filled 2018-12-27: qty 21.43

## 2018-12-27 MED ORDER — HEPARIN SOD (PORK) LOCK FLUSH 100 UNIT/ML IV SOLN
500.0000 [IU] | Freq: Once | INTRAVENOUS | Status: AC | PRN
  Administered 2018-12-27: 500 [IU]
  Filled 2018-12-27: qty 5

## 2018-12-27 MED ORDER — DIPHENHYDRAMINE HCL 25 MG PO CAPS
ORAL_CAPSULE | ORAL | Status: AC
  Filled 2018-12-27: qty 1

## 2018-12-27 MED ORDER — DIPHENHYDRAMINE HCL 25 MG PO CAPS
25.0000 mg | ORAL_CAPSULE | Freq: Once | ORAL | Status: AC
  Administered 2018-12-27: 25 mg via ORAL

## 2018-12-27 MED ORDER — ACETAMINOPHEN 325 MG PO TABS
650.0000 mg | ORAL_TABLET | Freq: Once | ORAL | Status: AC
  Administered 2018-12-27: 650 mg via ORAL

## 2018-12-27 NOTE — Patient Instructions (Signed)
[facility] Discharge Instructions for Patients Receiving Chemotherapy  Today you received the following chemotherapy agents: Trastuzumab (Herceptin)  To help prevent nausea and vomiting after your treatment, we encourage you to take your nausea medication as directed.   If you develop nausea and vomiting that is not controlled by your nausea medication, call the clinic.   BELOW ARE SYMPTOMS THAT SHOULD BE REPORTED IMMEDIATELY:  *FEVER GREATER THAN 100.5 F  *CHILLS WITH OR WITHOUT FEVER  NAUSEA AND VOMITING THAT IS NOT CONTROLLED WITH YOUR NAUSEA MEDICATION  *UNUSUAL SHORTNESS OF BREATH  *UNUSUAL BRUISING OR BLEEDING  TENDERNESS IN MOUTH AND THROAT WITH OR WITHOUT PRESENCE OF ULCERS  *URINARY PROBLEMS  *BOWEL PROBLEMS  UNUSUAL RASH Items with * indicate a potential emergency and should be followed up as soon as possible.  Feel free to call the clinic should you have any questions or concerns. The clinic phone number is [phone].  Please show the CHEMO ALERT CARD at check-in to the Emergency Department and triage nurse.  Coronavirus (COVID-19) Are you at risk?  Are you at risk for the Coronavirus (COVID-19)?  To be considered HIGH RISK for Coronavirus (COVID-19), you have to meet the following criteria:  . Traveled to [place], [place], [place], [place] or [place]; or in the [place] to [place], [place], [place], or [place]; and have fever, cough, and shortness of breath within the last 2 weeks of travel OR . Been in close contact with a person diagnosed with COVID-19 within the last 2 weeks and have fever, cough, and shortness of breath . IF YOU DO NOT MEET THESE CRITERIA, YOU ARE CONSIDERED LOW RISK FOR COVID-19.  What to do if you are HIGH RISK for COVID-19?  . If you are having a medical emergency, call 911. . Seek medical care right away. Before you go to a doctor's office, urgent care or emergency department, call ahead and tell them  about your recent travel, contact with someone diagnosed with COVID-19, and your symptoms. You should receive instructions from your physician's office regarding next steps of care.  . When you arrive at healthcare provider, tell the healthcare staff immediately you have returned from visiting [place], [place], [place], [place] or [place]; or traveled in the [place] to [place], [place], [place], or [place]; in the last two weeks or you have been in close contact with a person diagnosed with COVID-19 in the last 2 weeks.   . Tell the health care staff about your symptoms: fever, cough and shortness of breath. . After you have been seen by a medical provider, you will be either: o Tested for (COVID-19) and discharged home on quarantine except to seek medical care if symptoms worsen, and asked to  - Stay home and avoid contact with others until you get your results (4-5 days)  - Avoid travel on public transportation if possible (such as bus, train, or airplane) or o Sent to the Emergency Department by EMS for evaluation, COVID-19 testing, and possible admission depending on your condition and test results.  What to do if you are LOW RISK for COVID-19?  Reduce your risk of any infection by using the same precautions used for avoiding the common cold or flu:  . Wash your hands often with soap and warm water for at least 20 seconds.  If soap and water are not readily available, use an alcohol-based hand sanitizer with at least 60% alcohol.  . If coughing or   sneezing, cover your mouth and nose by coughing or sneezing into the elbow areas of your shirt or coat, into a tissue or into your sleeve (not your hands). . Avoid shaking hands with others and consider head nods or verbal greetings only. . Avoid touching your eyes, nose, or mouth with unwashed hands.  . Avoid close contact with people who are sick. . Avoid places or events with large numbers of people in one location, like concerts or  sporting events. . Carefully consider travel plans you have or are making. . If you are planning any travel outside or inside the [place], visit the CDC's Travelers' Health webpage for the latest health notices. . If you have some symptoms but not all symptoms, continue to monitor at home and seek medical attention if your symptoms worsen. . If you are having a medical emergency, call 911.   ADDITIONAL HEALTHCARE OPTIONS FOR PATIENTS  [facility] / e-Visit: [email or web address]         [facility]: [phone]  [facility]: [phone]                   [facility]: [phone]   

## 2019-01-02 ENCOUNTER — Other Ambulatory Visit: Payer: Self-pay | Admitting: Oncology

## 2019-01-17 ENCOUNTER — Inpatient Hospital Stay: Payer: Medicare Other

## 2019-01-17 ENCOUNTER — Other Ambulatory Visit: Payer: Self-pay

## 2019-01-17 VITALS — BP 121/80 | HR 70 | Temp 99.2°F | Resp 18

## 2019-01-17 DIAGNOSIS — C50411 Malignant neoplasm of upper-outer quadrant of right female breast: Secondary | ICD-10-CM

## 2019-01-17 DIAGNOSIS — Z171 Estrogen receptor negative status [ER-]: Secondary | ICD-10-CM

## 2019-01-17 DIAGNOSIS — Z95828 Presence of other vascular implants and grafts: Secondary | ICD-10-CM

## 2019-01-17 DIAGNOSIS — Z5112 Encounter for antineoplastic immunotherapy: Secondary | ICD-10-CM | POA: Diagnosis not present

## 2019-01-17 LAB — CBC WITH DIFFERENTIAL/PLATELET
Abs Immature Granulocytes: 0.02 10*3/uL (ref 0.00–0.07)
Basophils Absolute: 0 10*3/uL (ref 0.0–0.1)
Basophils Relative: 0 %
Eosinophils Absolute: 0.1 10*3/uL (ref 0.0–0.5)
Eosinophils Relative: 2 %
HCT: 35.7 % — ABNORMAL LOW (ref 36.0–46.0)
Hemoglobin: 11.6 g/dL — ABNORMAL LOW (ref 12.0–15.0)
Immature Granulocytes: 0 %
Lymphocytes Relative: 30 %
Lymphs Abs: 1.4 10*3/uL (ref 0.7–4.0)
MCH: 29.1 pg (ref 26.0–34.0)
MCHC: 32.5 g/dL (ref 30.0–36.0)
MCV: 89.5 fL (ref 80.0–100.0)
Monocytes Absolute: 0.4 10*3/uL (ref 0.1–1.0)
Monocytes Relative: 9 %
Neutro Abs: 2.8 10*3/uL (ref 1.7–7.7)
Neutrophils Relative %: 59 %
Platelets: 170 10*3/uL (ref 150–400)
RBC: 3.99 MIL/uL (ref 3.87–5.11)
RDW: 13 % (ref 11.5–15.5)
WBC: 4.7 10*3/uL (ref 4.0–10.5)
nRBC: 0 % (ref 0.0–0.2)

## 2019-01-17 LAB — CMP (CANCER CENTER ONLY)
ALT: 17 U/L (ref 0–44)
AST: 24 U/L (ref 15–41)
Albumin: 3.6 g/dL (ref 3.5–5.0)
Alkaline Phosphatase: 78 U/L (ref 38–126)
Anion gap: 8 (ref 5–15)
BUN: 24 mg/dL — ABNORMAL HIGH (ref 8–23)
CO2: 25 mmol/L (ref 22–32)
Calcium: 8.8 mg/dL — ABNORMAL LOW (ref 8.9–10.3)
Chloride: 107 mmol/L (ref 98–111)
Creatinine: 1.18 mg/dL — ABNORMAL HIGH (ref 0.44–1.00)
GFR, Est AFR Am: 53 mL/min — ABNORMAL LOW (ref >60)
GFR, Estimated: 45 mL/min — ABNORMAL LOW (ref >60)
Glucose, Bld: 102 mg/dL — ABNORMAL HIGH (ref 70–99)
Potassium: 4.3 mmol/L (ref 3.5–5.1)
Sodium: 140 mmol/L (ref 135–145)
Total Bilirubin: 0.3 mg/dL (ref 0.3–1.2)
Total Protein: 7.1 g/dL (ref 6.5–8.1)

## 2019-01-17 MED ORDER — ACETAMINOPHEN 325 MG PO TABS
650.0000 mg | ORAL_TABLET | Freq: Once | ORAL | Status: AC
  Administered 2019-01-17: 650 mg via ORAL

## 2019-01-17 MED ORDER — SODIUM CHLORIDE 0.9% FLUSH
10.0000 mL | INTRAVENOUS | Status: DC | PRN
  Administered 2019-01-17: 10 mL
  Filled 2019-01-17: qty 10

## 2019-01-17 MED ORDER — TRASTUZUMAB CHEMO 150 MG IV SOLR
450.0000 mg | Freq: Once | INTRAVENOUS | Status: AC
  Administered 2019-01-17: 450 mg via INTRAVENOUS
  Filled 2019-01-17: qty 21.43

## 2019-01-17 MED ORDER — SODIUM CHLORIDE 0.9 % IV SOLN
Freq: Once | INTRAVENOUS | Status: AC
  Administered 2019-01-17: 14:00:00 via INTRAVENOUS
  Filled 2019-01-17: qty 250

## 2019-01-17 MED ORDER — DIPHENHYDRAMINE HCL 25 MG PO CAPS
ORAL_CAPSULE | ORAL | Status: AC
  Filled 2019-01-17: qty 1

## 2019-01-17 MED ORDER — SODIUM CHLORIDE 0.9% FLUSH
10.0000 mL | Freq: Once | INTRAVENOUS | Status: AC
  Administered 2019-01-17: 10 mL
  Filled 2019-01-17: qty 10

## 2019-01-17 MED ORDER — HEPARIN SOD (PORK) LOCK FLUSH 100 UNIT/ML IV SOLN
500.0000 [IU] | Freq: Once | INTRAVENOUS | Status: AC | PRN
  Administered 2019-01-17: 500 [IU]
  Filled 2019-01-17: qty 5

## 2019-01-17 MED ORDER — ACETAMINOPHEN 325 MG PO TABS
ORAL_TABLET | ORAL | Status: AC
  Filled 2019-01-17: qty 2

## 2019-01-17 MED ORDER — DIPHENHYDRAMINE HCL 25 MG PO CAPS
25.0000 mg | ORAL_CAPSULE | Freq: Once | ORAL | Status: AC
  Administered 2019-01-17: 25 mg via ORAL

## 2019-01-17 NOTE — Patient Instructions (Signed)
[facility] Discharge Instructions for Patients Receiving Chemotherapy  Today you received the following chemotherapy agents: Trastuzumab (Herceptin)  To help prevent nausea and vomiting after your treatment, we encourage you to take your nausea medication as directed.   If you develop nausea and vomiting that is not controlled by your nausea medication, call the clinic.   BELOW ARE SYMPTOMS THAT SHOULD BE REPORTED IMMEDIATELY:  *FEVER GREATER THAN 100.5 F  *CHILLS WITH OR WITHOUT FEVER  NAUSEA AND VOMITING THAT IS NOT CONTROLLED WITH YOUR NAUSEA MEDICATION  *UNUSUAL SHORTNESS OF BREATH  *UNUSUAL BRUISING OR BLEEDING  TENDERNESS IN MOUTH AND THROAT WITH OR WITHOUT PRESENCE OF ULCERS  *URINARY PROBLEMS  *BOWEL PROBLEMS  UNUSUAL RASH Items with * indicate a potential emergency and should be followed up as soon as possible.  Feel free to call the clinic should you have any questions or concerns. The clinic phone number is [phone].  Please show the CHEMO ALERT CARD at check-in to the Emergency Department and triage nurse.  Coronavirus (COVID-19) Are you at risk?  Are you at risk for the Coronavirus (COVID-19)?  To be considered HIGH RISK for Coronavirus (COVID-19), you have to meet the following criteria:  . Traveled to [place], [place], [place], [place] or [place]; or in the [place] to [place], [place], [place], or [place]; and have fever, cough, and shortness of breath within the last 2 weeks of travel OR . Been in close contact with a person diagnosed with COVID-19 within the last 2 weeks and have fever, cough, and shortness of breath . IF YOU DO NOT MEET THESE CRITERIA, YOU ARE CONSIDERED LOW RISK FOR COVID-19.  What to do if you are HIGH RISK for COVID-19?  . If you are having a medical emergency, call 911. . Seek medical care right away. Before you go to a doctor's office, urgent care or emergency department, call ahead and tell them  about your recent travel, contact with someone diagnosed with COVID-19, and your symptoms. You should receive instructions from your physician's office regarding next steps of care.  . When you arrive at healthcare provider, tell the healthcare staff immediately you have returned from visiting [place], [place], [place], [place] or [place]; or traveled in the [place] to [place], [place], [place], or [place]; in the last two weeks or you have been in close contact with a person diagnosed with COVID-19 in the last 2 weeks.   . Tell the health care staff about your symptoms: fever, cough and shortness of breath. . After you have been seen by a medical provider, you will be either: o Tested for (COVID-19) and discharged home on quarantine except to seek medical care if symptoms worsen, and asked to  - Stay home and avoid contact with others until you get your results (4-5 days)  - Avoid travel on public transportation if possible (such as bus, train, or airplane) or o Sent to the Emergency Department by EMS for evaluation, COVID-19 testing, and possible admission depending on your condition and test results.  What to do if you are LOW RISK for COVID-19?  Reduce your risk of any infection by using the same precautions used for avoiding the common cold or flu:  . Wash your hands often with soap and warm water for at least 20 seconds.  If soap and water are not readily available, use an alcohol-based hand sanitizer with at least 60% alcohol.  . If coughing or   sneezing, cover your mouth and nose by coughing or sneezing into the elbow areas of your shirt or coat, into a tissue or into your sleeve (not your hands). . Avoid shaking hands with others and consider head nods or verbal greetings only. . Avoid touching your eyes, nose, or mouth with unwashed hands.  . Avoid close contact with people who are sick. . Avoid places or events with large numbers of people in one location, like concerts or  sporting events. . Carefully consider travel plans you have or are making. . If you are planning any travel outside or inside the US, visit the CDC's Travelers' Health webpage for the latest health notices. . If you have some symptoms but not all symptoms, continue to monitor at home and seek medical attention if your symptoms worsen. . If you are having a medical emergency, call 911.   ADDITIONAL HEALTHCARE OPTIONS FOR PATIENTS  [facility] / e-Visit: [email or web address]         [facility]: [phone]  [facility]: [phone]                   [facility]: [phone]   

## 2019-01-18 ENCOUNTER — Telehealth: Payer: Self-pay | Admitting: Oncology

## 2019-01-18 NOTE — Telephone Encounter (Signed)
GM PAL 7/20 moved f/u to [facility] due to patient also has infusion 7/20. Confirmed with patient.

## 2019-02-07 ENCOUNTER — Inpatient Hospital Stay: Payer: Medicare Other

## 2019-02-07 ENCOUNTER — Other Ambulatory Visit: Payer: Medicare Other

## 2019-02-07 ENCOUNTER — Ambulatory Visit: Payer: Medicare Other

## 2019-02-07 ENCOUNTER — Inpatient Hospital Stay (HOSPITAL_BASED_OUTPATIENT_CLINIC_OR_DEPARTMENT_OTHER): Payer: Medicare Other | Admitting: Adult Health

## 2019-02-07 ENCOUNTER — Ambulatory Visit: Payer: Medicare Other | Admitting: Oncology

## 2019-02-07 ENCOUNTER — Encounter: Payer: Self-pay | Admitting: Adult Health

## 2019-02-07 ENCOUNTER — Inpatient Hospital Stay: Payer: Medicare Other | Attending: Oncology

## 2019-02-07 ENCOUNTER — Other Ambulatory Visit: Payer: Self-pay

## 2019-02-07 VITALS — BP 107/70 | HR 78 | Temp 98.7°F | Resp 18 | Ht 59.0 in | Wt 167.2 lb

## 2019-02-07 DIAGNOSIS — Z171 Estrogen receptor negative status [ER-]: Secondary | ICD-10-CM | POA: Diagnosis not present

## 2019-02-07 DIAGNOSIS — Z79899 Other long term (current) drug therapy: Secondary | ICD-10-CM | POA: Diagnosis not present

## 2019-02-07 DIAGNOSIS — Z923 Personal history of irradiation: Secondary | ICD-10-CM | POA: Diagnosis not present

## 2019-02-07 DIAGNOSIS — I10 Essential (primary) hypertension: Secondary | ICD-10-CM

## 2019-02-07 DIAGNOSIS — C50411 Malignant neoplasm of upper-outer quadrant of right female breast: Secondary | ICD-10-CM | POA: Insufficient documentation

## 2019-02-07 DIAGNOSIS — Z5112 Encounter for antineoplastic immunotherapy: Secondary | ICD-10-CM | POA: Insufficient documentation

## 2019-02-07 DIAGNOSIS — Z95828 Presence of other vascular implants and grafts: Secondary | ICD-10-CM

## 2019-02-07 LAB — CBC WITH DIFFERENTIAL/PLATELET
Abs Immature Granulocytes: 0.03 10*3/uL (ref 0.00–0.07)
Basophils Absolute: 0 10*3/uL (ref 0.0–0.1)
Basophils Relative: 0 %
Eosinophils Absolute: 0.1 10*3/uL (ref 0.0–0.5)
Eosinophils Relative: 1 %
HCT: 37.1 % (ref 36.0–46.0)
Hemoglobin: 12.2 g/dL (ref 12.0–15.0)
Immature Granulocytes: 1 %
Lymphocytes Relative: 30 %
Lymphs Abs: 1.5 10*3/uL (ref 0.7–4.0)
MCH: 29.5 pg (ref 26.0–34.0)
MCHC: 32.9 g/dL (ref 30.0–36.0)
MCV: 89.6 fL (ref 80.0–100.0)
Monocytes Absolute: 0.5 10*3/uL (ref 0.1–1.0)
Monocytes Relative: 10 %
Neutro Abs: 2.8 10*3/uL (ref 1.7–7.7)
Neutrophils Relative %: 58 %
Platelets: 182 10*3/uL (ref 150–400)
RBC: 4.14 MIL/uL (ref 3.87–5.11)
RDW: 13.2 % (ref 11.5–15.5)
WBC: 4.9 10*3/uL (ref 4.0–10.5)
nRBC: 0 % (ref 0.0–0.2)

## 2019-02-07 LAB — COMPREHENSIVE METABOLIC PANEL
ALT: 15 U/L (ref 0–44)
AST: 23 U/L (ref 15–41)
Albumin: 3.7 g/dL (ref 3.5–5.0)
Alkaline Phosphatase: 77 U/L (ref 38–126)
Anion gap: 10 (ref 5–15)
BUN: 26 mg/dL — ABNORMAL HIGH (ref 8–23)
CO2: 23 mmol/L (ref 22–32)
Calcium: 9.3 mg/dL (ref 8.9–10.3)
Chloride: 107 mmol/L (ref 98–111)
Creatinine, Ser: 1.36 mg/dL — ABNORMAL HIGH (ref 0.44–1.00)
GFR calc Af Amer: 44 mL/min — ABNORMAL LOW (ref >60)
GFR calc non Af Amer: 38 mL/min — ABNORMAL LOW (ref >60)
Glucose, Bld: 84 mg/dL (ref 70–99)
Potassium: 4.1 mmol/L (ref 3.5–5.1)
Sodium: 140 mmol/L (ref 135–145)
Total Bilirubin: 0.5 mg/dL (ref 0.3–1.2)
Total Protein: 7.4 g/dL (ref 6.5–8.1)

## 2019-02-07 MED ORDER — SODIUM CHLORIDE 0.9 % IV SOLN
Freq: Once | INTRAVENOUS | Status: AC
  Administered 2019-02-07: 14:00:00 via INTRAVENOUS
  Filled 2019-02-07: qty 250

## 2019-02-07 MED ORDER — SODIUM CHLORIDE 0.9% FLUSH
10.0000 mL | INTRAVENOUS | Status: DC | PRN
  Administered 2019-02-07: 10 mL
  Filled 2019-02-07: qty 10

## 2019-02-07 MED ORDER — DIPHENHYDRAMINE HCL 25 MG PO CAPS
25.0000 mg | ORAL_CAPSULE | Freq: Once | ORAL | Status: AC
  Administered 2019-02-07: 25 mg via ORAL

## 2019-02-07 MED ORDER — SODIUM CHLORIDE 0.9% FLUSH
10.0000 mL | Freq: Once | INTRAVENOUS | Status: AC
  Administered 2019-02-07: 10 mL
  Filled 2019-02-07: qty 10

## 2019-02-07 MED ORDER — DIPHENHYDRAMINE HCL 25 MG PO CAPS
ORAL_CAPSULE | ORAL | Status: AC
  Filled 2019-02-07: qty 1

## 2019-02-07 MED ORDER — HEPARIN SOD (PORK) LOCK FLUSH 100 UNIT/ML IV SOLN
500.0000 [IU] | Freq: Once | INTRAVENOUS | Status: AC | PRN
  Administered 2019-02-07: 500 [IU]
  Filled 2019-02-07: qty 5

## 2019-02-07 MED ORDER — ACETAMINOPHEN 325 MG PO TABS
650.0000 mg | ORAL_TABLET | Freq: Once | ORAL | Status: AC
  Administered 2019-02-07: 650 mg via ORAL

## 2019-02-07 MED ORDER — TRASTUZUMAB CHEMO 150 MG IV SOLR
450.0000 mg | Freq: Once | INTRAVENOUS | Status: AC
  Administered 2019-02-07: 450 mg via INTRAVENOUS
  Filled 2019-02-07: qty 21.43

## 2019-02-07 MED ORDER — ACETAMINOPHEN 325 MG PO TABS
ORAL_TABLET | ORAL | Status: AC
  Filled 2019-02-07: qty 2

## 2019-02-07 NOTE — Progress Notes (Signed)
[facility]  [phone] [phone]     ID: Rachel Kelly DOB: [birth year]  MR#: [id number]  UKG#:[id number]  Patient Care Team: [name], MD as PCP - General (Family Medicine) Deneise Lever, MD as Referring Physician (Pulmonary Disease) [name], MD as Consulting Physician (General Surgery) [name], [name], MD as Consulting Physician (Oncology) Eppie Gibson, MD as Attending Physician (Radiation Oncology) Chelsea Aus, DDS as Consulting Physician (Dentistry) Harriett Sine, MD as Consulting Physician (Dermatology) Bensimhon, Shaune Pascal, MD as Consulting Physician (Cardiology) OTHER MD:  CHIEF COMPLAINT: HER-2 positive, estrogen receptor negative breast cancer  CURRENT TREATMENT: anti-HER-2 immunotherapy  INTERVAL HISTORY: Rachel Kelly returns today for follow-up and treatment of her HER-2 positive breast cancer. She is unaccompanied today.    She continues on trastuzumab every 3 weeks. She tolerates this well with no noticeable side effects.  Her most recent echocardiogram was on 12/14/2018 with EF of 55 - 60%.    REVIEW OF SYSTEMS: Rachel Kelly reports feeling well.  She notes she has cataracts and she thinks her vision may be slightly worsening.  She has an upcoming appointment to see her eye specialist.  She says her peripheral neuropathy in her toes is unchanged, however she says that her right great toe feels off.  She doesn't describe it as a numbness, or pain.  She is able to move it.  She says that she just can't put it into words.  Rachel Kelly notes some scalp pain as her hair grows back as she has tried to style it.    Rachel Kelly is without any fever, chills, cough, shortness of breath, chest pain, palpitations.  She has no bowel/bladder changes, nausea, vomiting, dysphagia.  She has no skin rash or lesion.  Her breasts are without any new nodules or skin changes.  A detailed ROS was otherwise non contributory.  HISTORY OF CURRENT ILLNESS: From  the original intake note:  TRAVIA ONSTAD had routine screening mammography on 05/26/2018 showing a possible abnormality in the right breast. She underwent bilateral diagnostic mammography with tomography and right breast ultrasonography at [facility] on 06/02/2018 showing: breast density category C, suspicious mass at the 10 o'clock position 8 cm from the nipple. It measures 10 x 7 x 6 mm. Just adjacent to this mass, there is an irregular hypoechoic mass with punctate echogenic foci. It measures 9 x 8 x 3 mm. Additional indeterminate mass at the 9 o'clock position 6 cm from the nipple. It measures 6 x 4 x 4 mm. No suspicious right axillary lymphadenopathy.  Accordingly on 06/02/2018 she proceeded to biopsy of two of the right breast areas in question. The pathology from this procedure showed [id number]): at the 10 o'clock position, invasive ductal carcinoma, grade 2, estrogen and progesterone receptor negative, HER-2 equivocal by immunohistochemistry at 2+, but negative by [facility] with a signals ratio of 1.47 and number per cell 2.20.  Biopsy of the right breast lesion at the 9 o'clock position showed a sclerotic lesion with calcifications and negative for carcinoma.  The patient's subsequent history is as detailed below.   PAST MEDICAL HISTORY: Past Medical History:  Diagnosis Date  . Anemia   . Blood transfusion 2005  . Hernia   . HH (hiatus hernia)   . Hyperlipidemia   . Hypertension   . PAH (pulmonary artery hypertension) ([place])   . Sarcoidosis   . Shortness of breath    on exertion  . Sleep apnea    does not use cpap  .  Wheezing     PAST SURGICAL HISTORY: Past Surgical History:  Procedure Laterality Date  . ABDOMINAL HYSTERECTOMY  1988  . BACK SURGERY  2005   lower  . BREAST LUMPECTOMY WITH RADIOACTIVE SEED AND SENTINEL LYMPH NODE BIOPSY Right 07/02/2018   Procedure: RIGHT BREAST RADIOACTIVE SEED LUMPECTOMY X2 AND RIGHT AXILLARY  SENTINEL LYMPH NODE BIOPSY;  Surgeon:  [name], MD;  Location: [facility];  Service: General;  Laterality: Right;  . ESOPHAGOGASTRODUODENOSCOPY  06/24/2012   Procedure: ESOPHAGOGASTRODUODENOSCOPY (EGD);  Surgeon: [name], MD;  Location: [name];  Service: General;  Laterality: N/A;  . HIATAL HERNIA REPAIR  08/18/2012   Procedure: LAPAROSCOPIC REPAIR OF HIATAL HERNIA;  Surgeon: [name], MD;  Location: [facility];  Service: General;  Laterality: N/A;  Laparoscopic Hiatal Hernia Repair   . INSERTION OF MESH  08/18/2012   Procedure: INSERTION OF MESH;  Surgeon: [name], MD;  Location: [facility];  Service: General;;  . IR IMAGING GUIDED PORT INSERTION  07/19/2018  . PORTACATH PLACEMENT N/A 07/02/2018   Procedure: INSERTION PORT-A-CATH TRIED NO PORT IN PLACE;  Surgeon: [name], MD;  Location: [facility];  Service: General;  Laterality: N/A;  . VESICOVAGINAL FISTULA CLOSURE W/ TAH      FAMILY HISTORY Family History  Problem Relation Age of Onset  . Clotting disorder Mother        PE  . Heart disease Father   . Cancer Maternal Grandmother        not sure type, but spread to the brain.  . Coronary artery disease Other   . Cancer Other        ? type   She notes that her father died from heart disease at age 62. Patients' mother lives in a nursing home and is 47 (as of November 2019)-- she has a form of dementia. The patient has 3 brothers and 0 sisters. A maternal aunt had a form of cancer, not clear what it was.  The maternal grandmother had breast cancer, but was never treated because of her old age.  The patient does not think anyone in her family had ovarian cancer, but there are 2 others in her family that had colon cancer.    GYNECOLOGIC HISTORY:  No LMP recorded. Patient has had a hysterectomy. 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 She lives alone. She does not have any pets.  Her daughter Deatra Canter "Cantrece" Grandville Silos, 48, lives in [place] and works in Psychologist, educational. No grandchildren.  The patient belongs to [place]: In place, but she can't remember who her power of attorney is (believes it is her daughter).   HEALTH MAINTENANCE: Social History   Tobacco Use  . Smoking status: Never Smoker  . Smokeless tobacco: Never Used  Substance Use Topics  . Alcohol use: No  . Drug use: No    Colonoscopy: Dr. Collene Mares  PAP: yes, but not sure when  Bone density: No   Allergies  Allergen Reactions  . Penicillins Rash    Fine red bumps that spread all over the body. Has patient had a PCN reaction causing immediate rash, facial/tongue/throat swelling, SOB or lightheadedness with hypotension: No Has patient had a PCN reaction causing severe rash  involving mucus membranes or skin necrosis: No Has patient had a PCN reaction that required hospitalization: No Has patient had a PCN reaction occurring within the last 10 years: No If all of the above answers are "NO", then may proceed with Cephalosporin use.   . Skin Adhesives [Cyanoacrylate] Itching and Rash    Dermabond    Current Outpatient Medications  Medication Sig Dispense Refill  . Ascorbic Acid (VITAMIN C) 1000 MG tablet Take 1,000 mg by mouth every other day.     . Cholecalciferol (VITAMIN D) 2000 UNITS CAPS Take 2,000 Units by mouth daily.     . Coenzyme Q10 200 MG TABS Take 200 mg by mouth daily.    . hydrocortisone 2.5 % cream Apply 1 application topically 2 (two) times daily as needed (dry skin).    [place] Kitchen ibuprofen (ADVIL,MOTRIN) 200 MG tablet Take 400 mg by mouth every 6 (six) hours as needed for headache or moderate pain.    [place] Kitchen ketotifen (ALAWAY) 0.025 % ophthalmic solution Place 1 drop into both eyes 2 (two) times daily.    [place] Kitchen loperamide (IMODIUM A-D) 2 MG capsule Take 2 mg  by mouth daily as needed for diarrhea or loose stools.    [place] Kitchen omeprazole (PRILOSEC) 20 MG capsule Take 20 mg by mouth daily.     . phenazopyridine (PYRIDIUM) 100 MG tablet Take 1 tablet (100 mg total) by mouth 3 (three) times daily as needed for pain. 10 tablet 0  . Polyvinyl Alcohol-Povidone PF (REFRESH) 1.4-0.6 % SOLN Place 1 drop into both eyes daily as needed (dry eyes).     . potassium gluconate 595 MG TABS Take 595 mg by mouth daily.     . simvastatin (ZOCOR) 20 MG tablet Take 20 mg by mouth every morning.     . sulfamethoxazole-trimethoprim (BACTRIM DS,SEPTRA DS) 800-160 MG tablet Take 1 tablet by mouth 2 (two) times daily. 14 tablet 0  . traMADol (ULTRAM) 50 MG tablet Take 1 tablet (50 mg total) by mouth every 6 (six) hours as needed. 15 tablet 1  . triamterene-hydrochlorothiazide (MAXZIDE-25) 37.5-25 MG per tablet Take 1 tablet by mouth every morning.      No current facility-administered medications for this visit.     OBJECTIVE: Vitals:   02/07/19 1259  BP: 107/70  Pulse: 78  Resp: 18  Temp: 98.7 F (37.1 C)  SpO2: 99%     Body mass index is 33.77 kg/m.   Wt Readings from Last 3 Encounters:  02/07/19 167 lb 3.2 oz (75.8 kg)  12/27/18 166 lb 12 oz (75.6 kg)  12/06/18 164 lb 12 oz (74.7 kg)  ECOG FS:1 - Symptomatic but completely ambulatory GENERAL: Patient is a well appearing female in no acute distress HEENT:  Sclerae anicteric.  Oropharynx clear and moist. No ulcerations or evidence of oropharyngeal candidiasis. Neck is supple.  NODES:  No cervical, supraclavicular, or axillary lymphadenopathy palpated.  BREAST EXAM:  Right breast s/p lumpectomy, no sign of recurrence, left breast benign LUNGS:  Clear to auscultation bilaterally.  No wheezes or rhonchi. HEART:  Regular rate and rhythm. No murmur appreciated. ABDOMEN:  Soft, nontender.  Positive, normoactive bowel sounds. No organomegaly palpated. MSK:  No focal spinal tenderness to palpation. Full range of motion  bilaterally in the upper extremities. EXTREMITIES:  No peripheral edema.   SKIN:  Clear with no obvious rashes or skin changes. No nail dyscrasia. NEURO:  Nonfocal. Well oriented.  Appropriate affect.     LAB RESULTS:  CMP  Component Value Date/Time   NA 140 02/07/2019 1205   K 4.1 02/07/2019 1205   CL 107 02/07/2019 1205   CO2 23 02/07/2019 1205   GLUCOSE 84 02/07/2019 1205   BUN 26 (H) 02/07/2019 1205   CREATININE 1.36 (H) 02/07/2019 1205   CREATININE 1.18 (H) 01/17/2019 1305   CALCIUM 9.3 02/07/2019 1205   PROT 7.4 02/07/2019 1205   ALBUMIN 3.7 02/07/2019 1205   AST 23 02/07/2019 1205   AST 24 01/17/2019 1305   ALT 15 02/07/2019 1205   ALT 17 01/17/2019 1305   ALKPHOS 77 02/07/2019 1205   BILITOT 0.5 02/07/2019 1205   BILITOT 0.3 01/17/2019 1305   GFRNONAA 38 (L) 02/07/2019 1205   GFRNONAA 45 (L) 01/17/2019 1305   GFRAA 44 (L) 02/07/2019 1205   GFRAA 53 (L) 01/17/2019 1305    No results found for: TOTALPROTELP, ALBUMINELP, A1GS, A2GS, BETS, BETA2SER, GAMS, MSPIKE, SPEI  No results found for: Nils Pyle, [facility]  Lab Results  Component Value Date   WBC 4.9 02/07/2019   NEUTROABS 2.8 02/07/2019   HGB 12.2 02/07/2019   HCT 37.1 02/07/2019   MCV 89.6 02/07/2019   PLT 182 02/07/2019    @LASTCHEMISTRY @  No results found for: LABCA2  No components found for: [id number]  No results for input(s): INR in the last 168 hours.  No results found for: LABCA2  No results found for: UXN235  No results found for: TDD220  No results found for: URK270  No results found for: CA2729  No components found for: HGQUANT  No results found for: CEA1 / No results found for: CEA1   No results found for: AFPTUMOR  No results found for: CHROMOGRNA  No results found for: PSA1  Appointment on 02/07/2019  Component Date Value Ref Range Status  . WBC 02/07/2019 4.9  4.0 - 10.5 K/uL Final  . RBC 02/07/2019 4.14  3.87 - 5.11 MIL/uL Final  .  Hemoglobin 02/07/2019 12.2  12.0 - 15.0 g/dL Final  . HCT 02/07/2019 37.1  36.0 - 46.0 % Final  . MCV 02/07/2019 89.6  80.0 - 100.0 fL Final  . MCH 02/07/2019 29.5  26.0 - 34.0 pg Final  . MCHC 02/07/2019 32.9  30.0 - 36.0 g/dL Final  . RDW 02/07/2019 13.2  11.5 - 15.5 % Final  . Platelets 02/07/2019 182  150 - 400 K/uL Final  . nRBC 02/07/2019 0.0  0.0 - 0.2 % Final  . Neutrophils Relative % 02/07/2019 58  % Final  . Neutro Abs 02/07/2019 2.8  1.7 - 7.7 K/uL Final  . Lymphocytes Relative 02/07/2019 30  % Final  . Lymphs Abs 02/07/2019 1.5  0.7 - 4.0 K/uL Final  . Monocytes Relative 02/07/2019 10  % Final  . Monocytes Absolute 02/07/2019 0.5  0.1 - 1.0 K/uL Final  . Eosinophils Relative 02/07/2019 1  % Final  . Eosinophils Absolute 02/07/2019 0.1  0.0 - 0.5 K/uL Final  . Basophils Relative 02/07/2019 0  % Final  . Basophils Absolute 02/07/2019 0.0  0.0 - 0.1 K/uL Final  . Immature Granulocytes 02/07/2019 1  % Final  . Abs Immature Granulocytes 02/07/2019 0.03  0.00 - 0.07 K/uL Final   Performed at [facility], [place] [address] [place], [place] [address]  . Sodium 02/07/2019 140  135 - 145 mmol/L Final  . Potassium 02/07/2019 4.1  3.5 - 5.1 mmol/L Final  . Chloride 02/07/2019 107  98 - 111 mmol/L Final  . CO2 02/07/2019  23  22 - 32 mmol/L Final  . Glucose, Bld 02/07/2019 84  70 - 99 mg/dL Final  . BUN 02/07/2019 26* 8 - 23 mg/dL Final  . Creatinine, Ser 02/07/2019 1.36* 0.44 - 1.00 mg/dL Final  . Calcium 02/07/2019 9.3  8.9 - 10.3 mg/dL Final  . Total Protein 02/07/2019 7.4  6.5 - 8.1 g/dL Final  . Albumin 02/07/2019 3.7  3.5 - 5.0 g/dL Final  . AST 02/07/2019 23  15 - 41 U/L Final  . ALT 02/07/2019 15  0 - 44 U/L Final  . Alkaline Phosphatase 02/07/2019 77  38 - 126 U/L Final  . Total Bilirubin 02/07/2019 0.5  0.3 - 1.2 mg/dL Final  . GFR calc non Af Amer 02/07/2019 38* >60 mL/min Final  . GFR calc Af Amer 02/07/2019 44* >60 mL/min Final  . Anion gap  02/07/2019 10  5 - 15 Final   Performed at [facility], [address] [place], [place] [address]    (this displays the last labs from the last 3 days)  No results found for: TOTALPROTELP, ALBUMINELP, A1GS, A2GS, BETS, BETA2SER, GAMS, MSPIKE, SPEI (this displays SPEP labs)  No results found for: KPAFRELGTCHN, LAMBDASER, KAPLAMBRATIO (kappa/lambda light chains)  No results found for: HGBA, HGBA2QUANT, HGBFQUANT, HGBSQUAN (Hemoglobinopathy evaluation)   No results found for: LDH  No results found for: IRON, TIBC, IRONPCTSAT (Iron and TIBC)  No results found for: FERRITIN  Urinalysis    Component Value Date/Time   COLORURINE STRAW (A) 07/27/2018 1050   APPEARANCEUR CLEAR 07/27/2018 1050   LABSPEC 1.011 07/27/2018 1050   PHURINE 6.0 07/27/2018 1050   GLUCOSEU NEGATIVE 07/27/2018 1050   HGBUR SMALL (A) 07/27/2018 1050   BILIRUBINUR NEGATIVE 07/27/2018 1050   KETONESUR NEGATIVE 07/27/2018 1050   PROTEINUR NEGATIVE 07/27/2018 1050   UROBILINOGEN 1.0 11/21/2012 1402   NITRITE NEGATIVE 07/27/2018 1050   LEUKOCYTESUR MODERATE (A) 07/27/2018 1050     STUDIES:  She is due for an echocardiogram anytime we can get that done  ELIGIBLE FOR AVAILABLE RESEARCH PROTOCOL: PREVENT  ASSESSMENT: 73 y.o. [place] woman status post right breast upper outer quadrant biopsy 06/02/2018 for a clinical T1b N0, stage IB invasive ductal carcinoma, grade 2, triple negative, with an MIB-1 of 10%  (1) status post right lumpectomy and sentinel lymph node sampling 07/02/2018 for a pT1b pN0,stage IA invasive ductal carcinoma, grade 2, now HER-2 positive, still estrogen and progesterone receptor negative, with an MIB-1 of 1%  (2) adjuvant chemotherapy consisting of paclitaxel x12 with trastuzumab given weekly started 07/27/2018  (a) paclitaxel discontinued after 3 doses because of neuropathy-- last dose 08/09/2018  (3) trastuzumab to continue to total 1 year (through December  2020)  (a) echocardiogram 07/22/2018 showed an ejection fraction in the 55-60% range.  (b) Echocardiogram on 12/14/2018 shows EF of 55-60%  (3) adjuvant radiation completed on 10/15/2018   PLAN: Laasya is doing well and is tolerating Trastuzumab well without any issues.  Her most recent echo was on 12/14/2018 and showed EF of 55-60%.  She will be due for a repeat echocardiogram in 02/2019.  Due to an increase in GLS, I will ask Dr. Haroldine Laws or Dr. Aundra Dubin to look at echo and perhaps see patient.  Lisbet has no evidence of breast cancer recurrence and will continue on Trastuzumab every three weeks.    I am not sure what to make of the odd sensation in her right great toe. She has no other associated symptoms, so we  will monitor and if anything changes, she will let us know.    Levette and I spent time today talking about what to expect as she completes her Trastuzumab, how recurrence is monitored, when her next mammogram is, etc.    Amylah will return every three weeks for labs and Trastuzumab.  She will see [place] back in around 3 months.  She was recommended to continue with the appropriate pandemic precautions.  She knows to call for any other issues that may develop before the next visit.  A total of (30) minutes of face-to-face time was spent with this patient with greater than 50% of that time in counseling and care-coordination.   Wilber Bihari, NP   02/07/19 1:30 PM Medical Oncology and Hematology [facility] [address] [place], [place] [address] Tel. [phone]    Fax. [phone]

## 2019-02-07 NOTE — Patient Instructions (Signed)
[facility] Discharge Instructions for Patients Receiving Chemotherapy  Today you received the following chemotherapy agent: Herceptin  To help prevent nausea and vomiting after your treatment, we encourage you to take your nausea medication as directed.   If you develop nausea and vomiting that is not controlled by your nausea medication, call the clinic.   BELOW ARE SYMPTOMS THAT SHOULD BE REPORTED IMMEDIATELY:  *FEVER GREATER THAN 100.5 F  *CHILLS WITH OR WITHOUT FEVER  NAUSEA AND VOMITING THAT IS NOT CONTROLLED WITH YOUR NAUSEA MEDICATION  *UNUSUAL SHORTNESS OF BREATH  *UNUSUAL BRUISING OR BLEEDING  TENDERNESS IN MOUTH AND THROAT WITH OR WITHOUT PRESENCE OF ULCERS  *URINARY PROBLEMS  *BOWEL PROBLEMS  UNUSUAL RASH Items with * indicate a potential emergency and should be followed up as soon as possible.  Feel free to call the clinic should you have any questions or concerns. The clinic phone number is [phone].  Please show the CHEMO ALERT CARD at check-in to the Emergency Department and triage nurse.   

## 2019-02-08 ENCOUNTER — Telehealth: Payer: Self-pay | Admitting: Adult Health

## 2019-02-08 ENCOUNTER — Telehealth: Payer: Self-pay

## 2019-02-08 NOTE — Telephone Encounter (Signed)
Spoke with patient to inform that labs are ok except her kidney function is slightly higher.  Encouraged patient to increase fluid intake daily.  Patient voiced understanding and thanks for call.

## 2019-02-08 NOTE — Telephone Encounter (Signed)
I talk with patient regarding schedule  

## 2019-02-08 NOTE — Telephone Encounter (Signed)
-----   Message from Gardenia Phlegm, NP sent at 02/08/2019  7:51 AM EDT ----- Please call patient.  Labs are ok.  Kidney function is slightly higher.  Needs to increase fluid intake.   ----- Message ----- From: Interface, Lab In [place] Sent: 02/07/2019  12:53 PM EDT To: Chauncey Cruel, MD

## 2019-02-10 ENCOUNTER — Encounter: Payer: Self-pay | Admitting: *Deleted

## 2019-02-16 ENCOUNTER — Other Ambulatory Visit: Payer: Self-pay | Admitting: Adult Health

## 2019-02-16 DIAGNOSIS — Z171 Estrogen receptor negative status [ER-]: Secondary | ICD-10-CM

## 2019-02-16 DIAGNOSIS — C50411 Malignant neoplasm of upper-outer quadrant of right female breast: Secondary | ICD-10-CM

## 2019-02-16 DIAGNOSIS — R0602 Shortness of breath: Secondary | ICD-10-CM

## 2019-02-23 ENCOUNTER — Other Ambulatory Visit: Payer: Self-pay

## 2019-02-23 DIAGNOSIS — Z20822 Contact with and (suspected) exposure to covid-19: Secondary | ICD-10-CM

## 2019-02-24 LAB — NOVEL CORONAVIRUS, NAA: SARS-CoV-2, NAA: NOT DETECTED

## 2019-02-28 ENCOUNTER — Inpatient Hospital Stay: Payer: Medicare Other | Attending: Oncology

## 2019-02-28 ENCOUNTER — Inpatient Hospital Stay: Payer: Medicare Other

## 2019-02-28 ENCOUNTER — Other Ambulatory Visit: Payer: Self-pay

## 2019-02-28 VITALS — BP 143/82 | HR 67 | Temp 98.5°F | Resp 18

## 2019-02-28 DIAGNOSIS — Z171 Estrogen receptor negative status [ER-]: Secondary | ICD-10-CM | POA: Diagnosis not present

## 2019-02-28 DIAGNOSIS — C50411 Malignant neoplasm of upper-outer quadrant of right female breast: Secondary | ICD-10-CM

## 2019-02-28 DIAGNOSIS — Z5112 Encounter for antineoplastic immunotherapy: Secondary | ICD-10-CM | POA: Insufficient documentation

## 2019-02-28 DIAGNOSIS — Z95828 Presence of other vascular implants and grafts: Secondary | ICD-10-CM

## 2019-02-28 DIAGNOSIS — Z79899 Other long term (current) drug therapy: Secondary | ICD-10-CM | POA: Insufficient documentation

## 2019-02-28 DIAGNOSIS — I10 Essential (primary) hypertension: Secondary | ICD-10-CM | POA: Diagnosis not present

## 2019-02-28 LAB — COMPREHENSIVE METABOLIC PANEL
ALT: 12 U/L (ref 0–44)
AST: 21 U/L (ref 15–41)
Albumin: 3.7 g/dL (ref 3.5–5.0)
Alkaline Phosphatase: 75 U/L (ref 38–126)
Anion gap: 12 (ref 5–15)
BUN: 14 mg/dL (ref 8–23)
CO2: 23 mmol/L (ref 22–32)
Calcium: 9 mg/dL (ref 8.9–10.3)
Chloride: 107 mmol/L (ref 98–111)
Creatinine, Ser: 1.2 mg/dL — ABNORMAL HIGH (ref 0.44–1.00)
GFR calc Af Amer: 52 mL/min — ABNORMAL LOW (ref >60)
GFR calc non Af Amer: 44 mL/min — ABNORMAL LOW (ref >60)
Glucose, Bld: 97 mg/dL (ref 70–99)
Potassium: 3.7 mmol/L (ref 3.5–5.1)
Sodium: 142 mmol/L (ref 135–145)
Total Bilirubin: 0.5 mg/dL (ref 0.3–1.2)
Total Protein: 6.9 g/dL (ref 6.5–8.1)

## 2019-02-28 LAB — CBC WITH DIFFERENTIAL/PLATELET
Abs Immature Granulocytes: 0.02 10*3/uL (ref 0.00–0.07)
Basophils Absolute: 0 10*3/uL (ref 0.0–0.1)
Basophils Relative: 0 %
Eosinophils Absolute: 0.1 10*3/uL (ref 0.0–0.5)
Eosinophils Relative: 1 %
HCT: 34.3 % — ABNORMAL LOW (ref 36.0–46.0)
Hemoglobin: 11.4 g/dL — ABNORMAL LOW (ref 12.0–15.0)
Immature Granulocytes: 0 %
Lymphocytes Relative: 25 %
Lymphs Abs: 1.3 10*3/uL (ref 0.7–4.0)
MCH: 30 pg (ref 26.0–34.0)
MCHC: 33.2 g/dL (ref 30.0–36.0)
MCV: 90.3 fL (ref 80.0–100.0)
Monocytes Absolute: 0.5 10*3/uL (ref 0.1–1.0)
Monocytes Relative: 10 %
Neutro Abs: 3.3 10*3/uL (ref 1.7–7.7)
Neutrophils Relative %: 64 %
Platelets: 176 10*3/uL (ref 150–400)
RBC: 3.8 MIL/uL — ABNORMAL LOW (ref 3.87–5.11)
RDW: 13.5 % (ref 11.5–15.5)
WBC: 5.2 10*3/uL (ref 4.0–10.5)
nRBC: 0 % (ref 0.0–0.2)

## 2019-02-28 MED ORDER — ACETAMINOPHEN 325 MG PO TABS
650.0000 mg | ORAL_TABLET | Freq: Once | ORAL | Status: AC
  Administered 2019-02-28: 650 mg via ORAL

## 2019-02-28 MED ORDER — DIPHENHYDRAMINE HCL 25 MG PO CAPS
25.0000 mg | ORAL_CAPSULE | Freq: Once | ORAL | Status: AC
  Administered 2019-02-28: 25 mg via ORAL

## 2019-02-28 MED ORDER — TRASTUZUMAB CHEMO 150 MG IV SOLR
450.0000 mg | Freq: Once | INTRAVENOUS | Status: AC
  Administered 2019-02-28: 450 mg via INTRAVENOUS
  Filled 2019-02-28: qty 21.43

## 2019-02-28 MED ORDER — SODIUM CHLORIDE 0.9 % IV SOLN
Freq: Once | INTRAVENOUS | Status: AC
  Administered 2019-02-28: 14:00:00 via INTRAVENOUS
  Filled 2019-02-28: qty 250

## 2019-02-28 MED ORDER — DIPHENHYDRAMINE HCL 25 MG PO CAPS
ORAL_CAPSULE | ORAL | Status: AC
  Filled 2019-02-28: qty 1

## 2019-02-28 MED ORDER — SODIUM CHLORIDE 0.9% FLUSH
10.0000 mL | Freq: Once | INTRAVENOUS | Status: AC
  Administered 2019-02-28: 10 mL
  Filled 2019-02-28: qty 10

## 2019-02-28 MED ORDER — HEPARIN SOD (PORK) LOCK FLUSH 100 UNIT/ML IV SOLN
500.0000 [IU] | Freq: Once | INTRAVENOUS | Status: AC | PRN
  Administered 2019-02-28: 500 [IU]
  Filled 2019-02-28: qty 5

## 2019-02-28 MED ORDER — ACETAMINOPHEN 325 MG PO TABS
ORAL_TABLET | ORAL | Status: AC
  Filled 2019-02-28: qty 2

## 2019-02-28 MED ORDER — SODIUM CHLORIDE 0.9% FLUSH
10.0000 mL | INTRAVENOUS | Status: DC | PRN
  Administered 2019-02-28: 10 mL
  Filled 2019-02-28: qty 10

## 2019-02-28 NOTE — Patient Instructions (Signed)
[facility] Discharge Instructions for Patients Receiving Chemotherapy  Today you received the following chemotherapy agent: Herceptin  To help prevent nausea and vomiting after your treatment, we encourage you to take your nausea medication as directed.   If you develop nausea and vomiting that is not controlled by your nausea medication, call the clinic.   BELOW ARE SYMPTOMS THAT SHOULD BE REPORTED IMMEDIATELY:  *FEVER GREATER THAN 100.5 F  *CHILLS WITH OR WITHOUT FEVER  NAUSEA AND VOMITING THAT IS NOT CONTROLLED WITH YOUR NAUSEA MEDICATION  *UNUSUAL SHORTNESS OF BREATH  *UNUSUAL BRUISING OR BLEEDING  TENDERNESS IN MOUTH AND THROAT WITH OR WITHOUT PRESENCE OF ULCERS  *URINARY PROBLEMS  *BOWEL PROBLEMS  UNUSUAL RASH Items with * indicate a potential emergency and should be followed up as soon as possible.  Feel free to call the clinic should you have any questions or concerns. The clinic phone number is [phone].  Please show the CHEMO ALERT CARD at check-in to the Emergency Department and triage nurse.   

## 2019-03-03 ENCOUNTER — Other Ambulatory Visit: Payer: Self-pay

## 2019-03-03 ENCOUNTER — Ambulatory Visit (HOSPITAL_COMMUNITY): Payer: Medicare Other | Attending: Cardiovascular Disease

## 2019-03-03 DIAGNOSIS — Z79899 Other long term (current) drug therapy: Secondary | ICD-10-CM | POA: Insufficient documentation

## 2019-03-03 DIAGNOSIS — I10 Essential (primary) hypertension: Secondary | ICD-10-CM | POA: Diagnosis not present

## 2019-03-03 DIAGNOSIS — I083 Combined rheumatic disorders of mitral, aortic and tricuspid valves: Secondary | ICD-10-CM | POA: Diagnosis not present

## 2019-03-03 DIAGNOSIS — C50411 Malignant neoplasm of upper-outer quadrant of right female breast: Secondary | ICD-10-CM | POA: Insufficient documentation

## 2019-03-03 DIAGNOSIS — E785 Hyperlipidemia, unspecified: Secondary | ICD-10-CM | POA: Diagnosis not present

## 2019-03-03 DIAGNOSIS — D869 Sarcoidosis, unspecified: Secondary | ICD-10-CM | POA: Insufficient documentation

## 2019-03-03 DIAGNOSIS — I361 Nonrheumatic tricuspid (valve) insufficiency: Secondary | ICD-10-CM | POA: Diagnosis not present

## 2019-03-03 DIAGNOSIS — Z171 Estrogen receptor negative status [ER-]: Secondary | ICD-10-CM | POA: Insufficient documentation

## 2019-03-07 ENCOUNTER — Ambulatory Visit (HOSPITAL_COMMUNITY)
Admission: RE | Admit: 2019-03-07 | Discharge: 2019-03-07 | Disposition: A | Discharge status: Home / Self Care | Payer: Medicare Other | Source: Ambulatory Visit | Attending: Internal Medicine | Admitting: Internal Medicine

## 2019-03-07 ENCOUNTER — Encounter (HOSPITAL_COMMUNITY): Payer: Self-pay | Admitting: Internal Medicine

## 2019-03-07 ENCOUNTER — Other Ambulatory Visit: Payer: Self-pay

## 2019-03-07 VITALS — BP 128/84 | HR 74 | Wt 162.2 lb

## 2019-03-07 DIAGNOSIS — Z880 Allergy status to penicillin: Secondary | ICD-10-CM | POA: Insufficient documentation

## 2019-03-07 DIAGNOSIS — E785 Hyperlipidemia, unspecified: Secondary | ICD-10-CM | POA: Insufficient documentation

## 2019-03-07 DIAGNOSIS — Z8249 Family history of ischemic heart disease and other diseases of the circulatory system: Secondary | ICD-10-CM | POA: Insufficient documentation

## 2019-03-07 DIAGNOSIS — Z923 Personal history of irradiation: Secondary | ICD-10-CM | POA: Insufficient documentation

## 2019-03-07 DIAGNOSIS — Z79899 Other long term (current) drug therapy: Secondary | ICD-10-CM | POA: Insufficient documentation

## 2019-03-07 DIAGNOSIS — I10 Essential (primary) hypertension: Secondary | ICD-10-CM

## 2019-03-07 DIAGNOSIS — C50411 Malignant neoplasm of upper-outer quadrant of right female breast: Secondary | ICD-10-CM | POA: Insufficient documentation

## 2019-03-07 DIAGNOSIS — G4733 Obstructive sleep apnea (adult) (pediatric): Secondary | ICD-10-CM | POA: Insufficient documentation

## 2019-03-07 DIAGNOSIS — D869 Sarcoidosis, unspecified: Secondary | ICD-10-CM | POA: Insufficient documentation

## 2019-03-07 DIAGNOSIS — Z171 Estrogen receptor negative status [ER-]: Secondary | ICD-10-CM | POA: Diagnosis not present

## 2019-03-07 NOTE — Patient Instructions (Signed)
No medication changes today. Continue current medications as prescribed.   Please monitor home blood pressure four times a week. (BP sheet provided)  Your physician has requested that you have an echocardiogram. Echocardiography is a painless test that uses sound waves to create images of your heart. It provides your doctor with information about the size and shape of your heart and how well your heart's chambers and valves are working. This procedure takes approximately one hour. There are no restrictions for this procedure.  Your physician recommends that you schedule a follow-up appointment in: 3 months.   At the [facility], you and your health needs are our priority. As part of our continuing mission to provide you with exceptional heart care, we have created designated Provider Care Teams. These Care Teams include your primary Cardiologist (physician) and Advanced Practice Providers (APPs- Physician Assistants and Nurse Practitioners) who all work together to provide you with the care you need, when you need it.   You may see any of the following providers on your designated Care Team at your next follow up: Marland Kitchen Dr Glori Bickers . Dr Loralie Champagne . Darrick Grinder, NP   Please be sure to bring in all your medications bottles to every appointment.

## 2019-03-07 NOTE — Progress Notes (Signed)
CARDIO-ONCOLOGY CLINIC CONSULT NOTE PCP: Dr. [name] Referring Physician: [name] Primary Cardiologist:  HPI: Rachel Kelly is a 73 y.o. female with h/o obesity, recently-diagnosed HTN, OSA (cannot tolerate CPAP)and right breast cancer referred by [name] for enrollment into the Cardio-Oncology program.  Diagnosed with right breast cancer diagnosed 11/19  Breast Cancer course   (1) status post right lumpectomy and sentinel lymph node sampling 07/02/2018 for a pT1b pN0,stage IA invasive ductal carcinoma, grade 2, now HER-2 positive, still estrogen and progesterone receptor negative, with an MIB-1 of 1%  (2) adjuvant chemotherapy consisting of paclitaxel x12 with trastuzumab given weekly started 07/27/2018             (a) paclitaxel discontinued after 3 doses because of neuropathy-- last dose 08/09/2018  (3) trastuzumab to continue to total 1 year (through December 2020)             (a) echocardiogram 07/22/2018 showed an ejection fraction in the 55-60% range.             (b) Echocardiogram on 12/14/2018 shows EF of 55-60%  (3) adjuvant radiation completed on 10/15/2018   Overall doing well. Walks 1 mile per day. No CP or SOB. No edema, orthopnea or PND. BP at home usually 115-135. Denies daytime sleepiness or fatigue.   All echos personally reviewed today  ECHO 1/20 EF 55-60% ECHO 12/14/18 EF 50-55%   GLS -14% (underestimated) ECHO 03/03/19 EF 55-60%   GLS -17.3% .    Review of Systems: [y] = yes, _0  = no   General: Weight gain _1 ; Weight loss _2 ; Anorexia _3 ; Fatigue _4 ; Fever _5 ; Chills _6 ; Weakness _7   Cardiac: Chest pain/pressure _8 ; Resting SOB _9 ; Exertional SOB _10 ; Orthopnea _11 ; Pedal Edema _12 ; Palpitations _13 ; Syncope _14 ; Presyncope _15 ; Paroxysmal nocturnal dyspnea_16   Pulmonary: Cough _17 ; Wheezing_18 ; Hemoptysis_19 ; Sputum _20 ; Snoring _21   GI: Vomiting_22 ; Dysphagia_23 ; Melena_24 ; Hematochezia _25 ; Heartburn_26 ; Abdominal pain _27 ;  Constipation _28 ; Diarrhea _29 ; BRBPR _30   GU: Hematuria_31 ; Dysuria _32 ; Nocturia_33   Vascular: Pain in legs with walking _34 ; Pain in feet with lying flat _35 ; Non-healing sores _36 ; Stroke _37 ; TIA _38 ; Slurred speech _39 ;  Neuro: Headaches_40 ; Vertigo_41 ; Seizures_42 ; Paresthesias_43 ;Blurred vision _44 ; Diplopia _45 ; Vision changes _46   Ortho/Skin: Arthritis [y]; Joint pain Blue.Reese ]; Muscle pain _47 ; Joint swelling _48 ; Back Pain _49 ; Rash _50   Psych: Depression_51 ; Anxiety_52   Heme: Bleeding problems _53 ; Clotting disorders _54 ; Anemia _55   Endocrine: Diabetes _56 ; Thyroid dysfunction_57    Past Medical History:  Diagnosis Date  . Anemia   . Blood transfusion 2005  . Hernia   . HH (hiatus hernia)   . Hyperlipidemia   . Hypertension   . PAH (pulmonary artery hypertension) ([place])   . Sarcoidosis   . Shortness of breath    on exertion  . Sleep apnea    does not use cpap  . Wheezing     Current Outpatient Medications  Medication Sig Dispense Refill  . Ascorbic Acid (VITAMIN C) 1000 MG tablet Take 1,000 mg by mouth every other day.     . Cholecalciferol (VITAMIN D) 2000 UNITS CAPS Take 2,000 Units by mouth daily.     Marland Kitchen  Coenzyme Q10 200 MG TABS Take 200 mg by mouth daily.    . hydrocortisone 2.5 % cream Apply 1 application topically 2 (two) times daily as needed (dry skin).    Marland Kitchen ibuprofen (ADVIL,MOTRIN) 200 MG tablet Take 400 mg by mouth every 6 (six) hours as needed for headache or moderate pain.    Marland Kitchen ketotifen (ALAWAY) 0.025 % ophthalmic solution Place 1 drop into both eyes 2 (two) times daily.    Marland Kitchen loperamide (IMODIUM A-D) 2 MG capsule Take 2 mg by mouth daily as needed for diarrhea or loose stools.    Marland Kitchen omeprazole (PRILOSEC) 20 MG capsule Take 20 mg by mouth daily.     . phenazopyridine (PYRIDIUM) 100 MG tablet Take 1 tablet (100 mg total) by mouth 3 (three) times daily as needed for pain. 10 tablet 0  . Polyvinyl Alcohol-Povidone PF (REFRESH) 1.4-0.6 % SOLN Place 1 drop into both  eyes daily as needed (dry eyes).     . potassium gluconate 595 MG TABS Take 595 mg by mouth daily.     . simvastatin (ZOCOR) 20 MG tablet Take 20 mg by mouth every morning.     . sulfamethoxazole-trimethoprim (BACTRIM DS,SEPTRA DS) 800-160 MG tablet Take 1 tablet by mouth 2 (two) times daily. 14 tablet 0  . traMADol (ULTRAM) 50 MG tablet Take 1 tablet (50 mg total) by mouth every 6 (six) hours as needed. 15 tablet 1  . triamterene-hydrochlorothiazide (MAXZIDE-25) 37.5-25 MG per tablet Take 1 tablet by mouth every morning.      No current facility-administered medications for this encounter.     Allergies  Allergen Reactions  . Penicillins Rash    Fine red bumps that spread all over the body. Has patient had a PCN reaction causing immediate rash, facial/tongue/throat swelling, SOB or lightheadedness with hypotension: No Has patient had a PCN reaction causing severe rash involving mucus membranes or skin necrosis: No Has patient had a PCN reaction that required hospitalization: No Has patient had a PCN reaction occurring within the last 10 years: No If all of the above answers are "NO", then may proceed with Cephalosporin use.   . Skin Adhesives [Cyanoacrylate] Itching and Rash    Dermabond      Social History   Socioeconomic History  . Marital status: Single    Spouse name: Not on file  . Number of children: Not on file  . Years of education: Not on file  . Highest education level: Not on file  Occupational History  . Occupation: Retired Tour manager  . Financial resource strain: Not on file  . Food insecurity    Worry: Not on file    Inability: Not on file  . Transportation needs    Medical: No    Non-medical: No  Tobacco Use  . Smoking status: Never Smoker  . Smokeless tobacco: Never Used  Substance and Sexual Activity  . Alcohol use: No  . Drug use: No  . Sexual activity: Not on file  Lifestyle  . Physical activity    Days per week: Not on file     Minutes per session: Not on file  . Stress: Not on file  Relationships  . Social Herbalist on phone: Not on file    Gets together: Not on file    Attends religious service: Not on file    Active member of club or organization: Not on file    Attends meetings of clubs or organizations: Not  on file    Relationship status: Not on file  . Intimate partner violence    Fear of current or ex partner: No    Emotionally abused: No    Physically abused: No    Forced sexual activity: No  Other Topics Concern  . Not on file  Social History Narrative  . Not on file      Family History  Problem Relation Age of Onset  . Clotting disorder Mother        PE  . Heart disease Father   . Cancer Maternal Grandmother        not sure type, but spread to the brain.  . Coronary artery disease Other   . Cancer Other        ? type    Vitals:   03/07/19 1324  BP: 128/84  Pulse: 74  SpO2: 98%  Weight: 73.6 kg (162 lb 3.2 oz)    PHYSICAL EXAM: General:  Well appearing. No respiratory difficulty HEENT: normal Neck: supple. no JVD. Carotids 2+ bilat; no bruits. No lymphadenopathy or thryomegaly appreciated. Cor: PMI nondisplaced. Regular rate & rhythm. No rubs, gallops or murmurs. Lungs: clear Abdomen: obese soft, nontender, nondistended. No hepatosplenomegaly. No bruits or masses. Good bowel sounds. Extremities: no cyanosis, clubbing, rash, edema Neuro: alert & oriented x 3, cranial nerves grossly intact. moves all 4 extremities w/o difficulty. Affect pleasant.    ASSESSMENT & PLAN: 1. Right Breast Cancer - diagnosed 11/19 ER/PR negative HER 2-neu + - adjuvant chemotherapy consisting of paclitaxel x12 with trastuzumab given weekly started 07/27/2018 paclitaxel discontinued after 3 doses because of neuropathy  - trastuzumab to continue to total 1 year (through December 2020) - ECHO 1/20 EF 55-60% - ECHO 12/14/18 EF 50-55%   GLS -14% (underestimated) - ECHO 03/03/19 EF 55-60%    GLS -17.3% . -Explained incidence of Herceptin cardiotoxicity and role of Cardio-oncology clinic at length. Echo images reviewed personally. All parameters stable. Reviewed signs and symptoms of HF to look for. Continue Herceptin. Follow-up with echo in 3 months.  2. HTN  - recently diagnosed - BP ok today - will have her keep BP log and bring at next visit.   3. OSA - Sleep study 11/13 (AHI 17.6) - Previously seen by Dr. Annamaria Boots  - Unable to tolerate CPAP - No s/s of OSA clinically   Glori Bickers, MD  12:46 PM

## 2019-03-14 NOTE — Progress Notes (Signed)
The following biosimilar Kanjinti (trastuzumab-anns) has been selected for use in this patient.  Maggie Tyrome Donatelli, PharmD, BCPS Oncology Pharmacist Pharmacy Phone: [phone] 03/14/2019    

## 2019-03-15 ENCOUNTER — Other Ambulatory Visit: Payer: Self-pay | Admitting: *Deleted

## 2019-03-21 ENCOUNTER — Inpatient Hospital Stay: Payer: Medicare Other

## 2019-03-21 ENCOUNTER — Other Ambulatory Visit: Payer: Self-pay

## 2019-03-21 VITALS — BP 126/73 | HR 65 | Temp 98.2°F | Resp 18 | Wt 164.2 lb

## 2019-03-21 DIAGNOSIS — C50411 Malignant neoplasm of upper-outer quadrant of right female breast: Secondary | ICD-10-CM

## 2019-03-21 DIAGNOSIS — Z171 Estrogen receptor negative status [ER-]: Secondary | ICD-10-CM

## 2019-03-21 DIAGNOSIS — Z95828 Presence of other vascular implants and grafts: Secondary | ICD-10-CM

## 2019-03-21 DIAGNOSIS — Z5112 Encounter for antineoplastic immunotherapy: Secondary | ICD-10-CM | POA: Diagnosis not present

## 2019-03-21 LAB — CBC WITH DIFFERENTIAL/PLATELET
Abs Immature Granulocytes: 0.02 10*3/uL (ref 0.00–0.07)
Basophils Absolute: 0 10*3/uL (ref 0.0–0.1)
Basophils Relative: 1 %
Eosinophils Absolute: 0.1 10*3/uL (ref 0.0–0.5)
Eosinophils Relative: 1 %
HCT: 36.9 % (ref 36.0–46.0)
Hemoglobin: 12.2 g/dL (ref 12.0–15.0)
Immature Granulocytes: 0 %
Lymphocytes Relative: 31 %
Lymphs Abs: 1.7 10*3/uL (ref 0.7–4.0)
MCH: 30.1 pg (ref 26.0–34.0)
MCHC: 33.1 g/dL (ref 30.0–36.0)
MCV: 91.1 fL (ref 80.0–100.0)
Monocytes Absolute: 0.5 10*3/uL (ref 0.1–1.0)
Monocytes Relative: 9 %
Neutro Abs: 3.1 10*3/uL (ref 1.7–7.7)
Neutrophils Relative %: 58 %
Platelets: 189 10*3/uL (ref 150–400)
RBC: 4.05 MIL/uL (ref 3.87–5.11)
RDW: 13.1 % (ref 11.5–15.5)
WBC: 5.4 10*3/uL (ref 4.0–10.5)
nRBC: 0 % (ref 0.0–0.2)

## 2019-03-21 LAB — COMPREHENSIVE METABOLIC PANEL
ALT: 24 U/L (ref 0–44)
AST: 28 U/L (ref 15–41)
Albumin: 3.9 g/dL (ref 3.5–5.0)
Alkaline Phosphatase: 73 U/L (ref 38–126)
Anion gap: 9 (ref 5–15)
BUN: 20 mg/dL (ref 8–23)
CO2: 24 mmol/L (ref 22–32)
Calcium: 8.9 mg/dL (ref 8.9–10.3)
Chloride: 106 mmol/L (ref 98–111)
Creatinine, Ser: 1.22 mg/dL — ABNORMAL HIGH (ref 0.44–1.00)
GFR calc Af Amer: 51 mL/min — ABNORMAL LOW (ref >60)
GFR calc non Af Amer: 44 mL/min — ABNORMAL LOW (ref >60)
Glucose, Bld: 92 mg/dL (ref 70–99)
Potassium: 4.1 mmol/L (ref 3.5–5.1)
Sodium: 139 mmol/L (ref 135–145)
Total Bilirubin: 0.4 mg/dL (ref 0.3–1.2)
Total Protein: 7.3 g/dL (ref 6.5–8.1)

## 2019-03-21 MED ORDER — SODIUM CHLORIDE 0.9% FLUSH
10.0000 mL | INTRAVENOUS | Status: DC | PRN
  Administered 2019-03-21: 10 mL
  Filled 2019-03-21: qty 10

## 2019-03-21 MED ORDER — HEPARIN SOD (PORK) LOCK FLUSH 100 UNIT/ML IV SOLN
500.0000 [IU] | Freq: Once | INTRAVENOUS | Status: AC | PRN
  Administered 2019-03-21: 500 [IU]
  Filled 2019-03-21: qty 5

## 2019-03-21 MED ORDER — SODIUM CHLORIDE 0.9 % IV SOLN
Freq: Once | INTRAVENOUS | Status: AC
  Administered 2019-03-21: 13:00:00 via INTRAVENOUS
  Filled 2019-03-21: qty 250

## 2019-03-21 MED ORDER — DIPHENHYDRAMINE HCL 25 MG PO CAPS
25.0000 mg | ORAL_CAPSULE | Freq: Once | ORAL | Status: AC
  Administered 2019-03-21: 25 mg via ORAL

## 2019-03-21 MED ORDER — ACETAMINOPHEN 325 MG PO TABS
650.0000 mg | ORAL_TABLET | Freq: Once | ORAL | Status: AC
  Administered 2019-03-21: 650 mg via ORAL

## 2019-03-21 MED ORDER — DIPHENHYDRAMINE HCL 25 MG PO CAPS
ORAL_CAPSULE | ORAL | Status: AC
  Filled 2019-03-21: qty 1

## 2019-03-21 MED ORDER — ACETAMINOPHEN 325 MG PO TABS
ORAL_TABLET | ORAL | Status: AC
  Filled 2019-03-21: qty 2

## 2019-03-21 MED ORDER — TRASTUZUMAB-ANNS CHEMO 150 MG IV SOLR
450.0000 mg | Freq: Once | INTRAVENOUS | Status: AC
  Administered 2019-03-21: 450 mg via INTRAVENOUS
  Filled 2019-03-21: qty 21.43

## 2019-03-21 MED ORDER — SODIUM CHLORIDE 0.9% FLUSH
10.0000 mL | Freq: Once | INTRAVENOUS | Status: AC
  Administered 2019-03-21: 10 mL
  Filled 2019-03-21: qty 10

## 2019-03-21 NOTE — Patient Instructions (Signed)
[facility] Discharge Instructions for Patients Receiving Chemotherapy  Today you received the following chemotherapy agent: Herceptin  To help prevent nausea and vomiting after your treatment, we encourage you to take your nausea medication as directed.   If you develop nausea and vomiting that is not controlled by your nausea medication, call the clinic.   BELOW ARE SYMPTOMS THAT SHOULD BE REPORTED IMMEDIATELY:  *FEVER GREATER THAN 100.5 F  *CHILLS WITH OR WITHOUT FEVER  NAUSEA AND VOMITING THAT IS NOT CONTROLLED WITH YOUR NAUSEA MEDICATION  *UNUSUAL SHORTNESS OF BREATH  *UNUSUAL BRUISING OR BLEEDING  TENDERNESS IN MOUTH AND THROAT WITH OR WITHOUT PRESENCE OF ULCERS  *URINARY PROBLEMS  *BOWEL PROBLEMS  UNUSUAL RASH Items with * indicate a potential emergency and should be followed up as soon as possible.  Feel free to call the clinic should you have any questions or concerns. The clinic phone number is [phone].  Please show the CHEMO ALERT CARD at check-in to the Emergency Department and triage nurse.   

## 2019-04-11 ENCOUNTER — Other Ambulatory Visit: Payer: Self-pay

## 2019-04-11 ENCOUNTER — Inpatient Hospital Stay: Payer: Medicare Other

## 2019-04-11 ENCOUNTER — Other Ambulatory Visit: Payer: Self-pay | Admitting: Oncology

## 2019-04-11 ENCOUNTER — Inpatient Hospital Stay: Payer: Medicare Other | Attending: Oncology

## 2019-04-11 VITALS — BP 112/69 | HR 63 | Temp 97.8°F | Resp 17

## 2019-04-11 DIAGNOSIS — Z171 Estrogen receptor negative status [ER-]: Secondary | ICD-10-CM | POA: Insufficient documentation

## 2019-04-11 DIAGNOSIS — Z95828 Presence of other vascular implants and grafts: Secondary | ICD-10-CM

## 2019-04-11 DIAGNOSIS — Z5112 Encounter for antineoplastic immunotherapy: Secondary | ICD-10-CM | POA: Insufficient documentation

## 2019-04-11 DIAGNOSIS — Z79899 Other long term (current) drug therapy: Secondary | ICD-10-CM | POA: Insufficient documentation

## 2019-04-11 DIAGNOSIS — C50411 Malignant neoplasm of upper-outer quadrant of right female breast: Secondary | ICD-10-CM | POA: Insufficient documentation

## 2019-04-11 LAB — COMPREHENSIVE METABOLIC PANEL
ALT: 14 U/L (ref 0–44)
AST: 23 U/L (ref 15–41)
Albumin: 3.8 g/dL (ref 3.5–5.0)
Alkaline Phosphatase: 73 U/L (ref 38–126)
Anion gap: 8 (ref 5–15)
BUN: 19 mg/dL (ref 8–23)
CO2: 25 mmol/L (ref 22–32)
Calcium: 9.1 mg/dL (ref 8.9–10.3)
Chloride: 108 mmol/L (ref 98–111)
Creatinine, Ser: 1.26 mg/dL — ABNORMAL HIGH (ref 0.44–1.00)
GFR calc Af Amer: 49 mL/min — ABNORMAL LOW (ref >60)
GFR calc non Af Amer: 42 mL/min — ABNORMAL LOW (ref >60)
Glucose, Bld: 111 mg/dL — ABNORMAL HIGH (ref 70–99)
Potassium: 4 mmol/L (ref 3.5–5.1)
Sodium: 141 mmol/L (ref 135–145)
Total Bilirubin: 0.4 mg/dL (ref 0.3–1.2)
Total Protein: 7.2 g/dL (ref 6.5–8.1)

## 2019-04-11 LAB — CBC WITH DIFFERENTIAL/PLATELET
Abs Immature Granulocytes: 0.01 10*3/uL (ref 0.00–0.07)
Basophils Absolute: 0 10*3/uL (ref 0.0–0.1)
Basophils Relative: 0 %
Eosinophils Absolute: 0.1 10*3/uL (ref 0.0–0.5)
Eosinophils Relative: 2 %
HCT: 37.3 % (ref 36.0–46.0)
Hemoglobin: 12.1 g/dL (ref 12.0–15.0)
Immature Granulocytes: 0 %
Lymphocytes Relative: 28 %
Lymphs Abs: 1.5 10*3/uL (ref 0.7–4.0)
MCH: 29.7 pg (ref 26.0–34.0)
MCHC: 32.4 g/dL (ref 30.0–36.0)
MCV: 91.4 fL (ref 80.0–100.0)
Monocytes Absolute: 0.5 10*3/uL (ref 0.1–1.0)
Monocytes Relative: 10 %
Neutro Abs: 3.1 10*3/uL (ref 1.7–7.7)
Neutrophils Relative %: 60 %
Platelets: 176 10*3/uL (ref 150–400)
RBC: 4.08 MIL/uL (ref 3.87–5.11)
RDW: 13.1 % (ref 11.5–15.5)
WBC: 5.2 10*3/uL (ref 4.0–10.5)
nRBC: 0 % (ref 0.0–0.2)

## 2019-04-11 MED ORDER — DIPHENHYDRAMINE HCL 25 MG PO CAPS
25.0000 mg | ORAL_CAPSULE | Freq: Once | ORAL | Status: AC
  Administered 2019-04-11: 25 mg via ORAL

## 2019-04-11 MED ORDER — TRASTUZUMAB-ANNS CHEMO 150 MG IV SOLR
450.0000 mg | Freq: Once | INTRAVENOUS | Status: AC
  Administered 2019-04-11: 450 mg via INTRAVENOUS
  Filled 2019-04-11: qty 21.43

## 2019-04-11 MED ORDER — ACETAMINOPHEN 325 MG PO TABS
ORAL_TABLET | ORAL | Status: AC
  Filled 2019-04-11: qty 2

## 2019-04-11 MED ORDER — SODIUM CHLORIDE 0.9% FLUSH
10.0000 mL | Freq: Once | INTRAVENOUS | Status: AC
  Administered 2019-04-11: 10 mL
  Filled 2019-04-11: qty 10

## 2019-04-11 MED ORDER — ACETAMINOPHEN 325 MG PO TABS
650.0000 mg | ORAL_TABLET | Freq: Once | ORAL | Status: AC
  Administered 2019-04-11: 650 mg via ORAL

## 2019-04-11 MED ORDER — SODIUM CHLORIDE 0.9% FLUSH
10.0000 mL | INTRAVENOUS | Status: DC | PRN
  Administered 2019-04-11: 10 mL
  Filled 2019-04-11: qty 10

## 2019-04-11 MED ORDER — SODIUM CHLORIDE 0.9 % IV SOLN
Freq: Once | INTRAVENOUS | Status: AC
  Administered 2019-04-11: 15:00:00 via INTRAVENOUS
  Filled 2019-04-11: qty 250

## 2019-04-11 MED ORDER — HEPARIN SOD (PORK) LOCK FLUSH 100 UNIT/ML IV SOLN
500.0000 [IU] | Freq: Once | INTRAVENOUS | Status: AC | PRN
  Administered 2019-04-11: 500 [IU]
  Filled 2019-04-11: qty 5

## 2019-04-11 MED ORDER — DIPHENHYDRAMINE HCL 25 MG PO CAPS
ORAL_CAPSULE | ORAL | Status: AC
  Filled 2019-04-11: qty 1

## 2019-04-11 NOTE — Patient Instructions (Signed)
[facility] Discharge Instructions for Patients Receiving Chemotherapy  Today you received the following chemotherapy agent: Trastuzumab.  To help prevent nausea and vomiting after your treatment, we encourage you to take your nausea medication as directed.   If you develop nausea and vomiting that is not controlled by your nausea medication, call the clinic.   BELOW ARE SYMPTOMS THAT SHOULD BE REPORTED IMMEDIATELY:  *FEVER GREATER THAN 100.5 F  *CHILLS WITH OR WITHOUT FEVER  NAUSEA AND VOMITING THAT IS NOT CONTROLLED WITH YOUR NAUSEA MEDICATION  *UNUSUAL SHORTNESS OF BREATH  *UNUSUAL BRUISING OR BLEEDING  TENDERNESS IN MOUTH AND THROAT WITH OR WITHOUT PRESENCE OF ULCERS  *URINARY PROBLEMS  *BOWEL PROBLEMS  UNUSUAL RASH Items with * indicate a potential emergency and should be followed up as soon as possible.  Feel free to call the clinic should you have any questions or concerns. The clinic phone number is [phone].  Please show the [place] at check-in to the Emergency Department and triage nurse.

## 2019-05-01 NOTE — Progress Notes (Signed)
[place]  Telephone:([phone]) [phone] Fax:(336) [phone]     ID: Rachel Kelly DOB: Apr 24, 1946  MR#: [id number]  IEP#:[id number]  Patient Care Team: [name], MD as PCP - General (Family Medicine) Deneise Lever, MD as Referring Physician (Pulmonary Disease) [name], MD as Consulting Physician (General Surgery) , [name], MD as Consulting Physician (Oncology) Eppie Gibson, MD as Attending Physician (Radiation Oncology) Chelsea Aus, DDS as Consulting Physician (Dentistry) Harriett Sine, MD as Consulting Physician (Dermatology) Bensimhon, Shaune Pascal, MD as Consulting Physician (Cardiology) OTHER MD:  CHIEF COMPLAINT: HER-2 positive, estrogen receptor negative breast cancer  CURRENT TREATMENT: anti-HER-2 immunotherapy   INTERVAL HISTORY: Rachel Kelly returns today for follow-up and treatment of her HER-2 positive breast cancer. She was last seen here on 02/07/2019.   She continues on trastuzumab every 3 weeks.  She tolerates this with no side effects that she is aware of.  Rachel Kelly's last echocardiogram on 03/03/2019, showed an ejection fraction in the 50% - 55% range.  He is already scheduled for her final echocardiogram mid November.  Since her last visit here, she has not undergone any additional studies. She is scheduled for mammography on 05/30/2019.   REVIEW OF SYSTEMS: Rachel Kelly tells me since the last visit here she discovered that her 3 brothers all have prostate cancer.  They all apparently get the news to themselves before this.  She has not been tested for genetic mutation herself.  They have not been tested either.  She exercises at home on a treadmill and is extremely careful when she goes out to stay away from people, wear a mask on, keep her hands clean  A detailed review of systems today was otherwise stable    HISTORY OF CURRENT ILLNESS: From the original intake note:  Rachel Kelly had routine screening mammography on 05/26/2018  showing a possible abnormality in the right breast. She underwent bilateral diagnostic mammography with tomography and right breast ultrasonography at [facility] on 06/02/2018 showing: breast density category C, suspicious mass at the 10 o'clock position 8 cm from the nipple. It measures 10 x 7 x 6 mm. Just adjacent to this mass, there is an irregular hypoechoic mass with punctate echogenic foci. It measures 9 x 8 x 3 mm. Additional indeterminate mass at the 9 o'clock position 6 cm from the nipple. It measures 6 x 4 x 4 mm. No suspicious right axillary lymphadenopathy.  Accordingly on 06/02/2018 she proceeded to biopsy of two of the right breast areas in question. The pathology from this procedure showed [phone]): at the 10 o'clock position, invasive ductal carcinoma, grade 2, estrogen and progesterone receptor negative, HER-2 equivocal by immunohistochemistry at 2+, but negative by [facility] with a signals ratio of 1.47 and number per cell 2.20.  Biopsy of the right breast lesion at the 9 o'clock position showed a sclerotic lesion with calcifications and negative for carcinoma.  The patient's subsequent history is as detailed below.   PAST MEDICAL HISTORY: Past Medical History:  Diagnosis Date  . Anemia   . Blood transfusion 2005  . Hernia   . HH (hiatus hernia)   . Hyperlipidemia   . Hypertension   . PAH (pulmonary artery hypertension) ([place])   . Sarcoidosis   . Shortness of breath    on exertion  . Sleep apnea    does not use cpap  . Wheezing     PAST SURGICAL HISTORY: Past Surgical History:  Procedure Laterality Date  . ABDOMINAL HYSTERECTOMY  1988  .  BACK SURGERY  2005   lower  . BREAST LUMPECTOMY WITH RADIOACTIVE SEED AND SENTINEL LYMPH NODE BIOPSY Right 07/02/2018   Procedure: RIGHT BREAST RADIOACTIVE SEED LUMPECTOMY X2 AND RIGHT AXILLARY  SENTINEL LYMPH NODE BIOPSY;  Surgeon: [name], MD;  Location: [facility];  Service: General;  Laterality: Right;  .  ESOPHAGOGASTRODUODENOSCOPY  06/24/2012   Procedure: ESOPHAGOGASTRODUODENOSCOPY (EGD);  Surgeon: [name], MD;  Location: [name];  Service: General;  Laterality: N/A;  . HIATAL HERNIA REPAIR  08/18/2012   Procedure: LAPAROSCOPIC REPAIR OF HIATAL HERNIA;  Surgeon: [name], MD;  Location: [facility];  Service: General;  Laterality: N/A;  Laparoscopic Hiatal Hernia Repair   . INSERTION OF MESH  08/18/2012   Procedure: INSERTION OF MESH;  Surgeon: [name], MD;  Location: [facility];  Service: General;;  . IR IMAGING GUIDED PORT INSERTION  07/19/2018  . PORTACATH PLACEMENT N/A 07/02/2018   Procedure: INSERTION PORT-A-CATH TRIED NO PORT IN PLACE;  Surgeon: [name], MD;  Location: [facility];  Service: General;  Laterality: N/A;  . VESICOVAGINAL FISTULA CLOSURE W/ TAH      FAMILY HISTORY Family History  Problem Relation Age of Onset  . Clotting disorder Mother        PE  . Heart disease Father   . Cancer Maternal Grandmother        not sure type, but spread to the brain.  . Coronary artery disease Other   . Cancer Other        ? type   She notes that her father died from heart disease at age 20. Patients' mother lives in a nursing home and is 76 (as of November 2019)-- she has a form of dementia. The patient has 3 brothers and 0 sisters. A maternal aunt had a form of cancer, not clear what it was.  The maternal grandmother had breast cancer, but was never treated because of her old age.  The patient does not think anyone in her family had ovarian cancer, but there are 2 others in her family that had colon cancer.    GYNECOLOGIC HISTORY:  No LMP recorded. Patient has had a hysterectomy. 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 She lives alone. She  does not have any pets.  Her daughter Deatra Canter "Cantrece" Grandville Silos, 48, lives in [place] and works in Psychologist, educational. No grandchildren.  The patient belongs to [place]: In place, but she can't remember who her power of attorney is (believes it is her daughter).   HEALTH MAINTENANCE: Social History   Tobacco Use  . Smoking status: Never Smoker  . Smokeless tobacco: Never Used  Substance Use Topics  . Alcohol use: No  . Drug use: No    Colonoscopy: Dr. Collene Mares  PAP: yes, but not sure when  Bone density: No   Allergies  Allergen Reactions  . Penicillins Rash    Fine red bumps that spread all over the body. Has patient had a PCN reaction causing immediate rash, facial/tongue/throat swelling, SOB or lightheadedness with hypotension: No Has patient had a PCN reaction causing severe rash involving mucus membranes or skin necrosis: No Has patient had a PCN reaction that required hospitalization: No Has patient had a PCN reaction  occurring within the last 10 years: No If all of the above answers are "NO", then may proceed with Cephalosporin use.   . Skin Adhesives [Cyanoacrylate] Itching and Rash    Dermabond    Current Outpatient Medications  Medication Sig Dispense Refill  . Ascorbic Acid (VITAMIN C) 1000 MG tablet Take 1,000 mg by mouth every other day.     . Cholecalciferol (VITAMIN D) 2000 UNITS CAPS Take 2,000 Units by mouth daily.     . Coenzyme Q10 200 MG TABS Take 200 mg by mouth daily.    . hydrocortisone 2.5 % cream Apply 1 application topically 2 (two) times daily as needed (dry skin).    Marland Kitchen ibuprofen (ADVIL,MOTRIN) 200 MG tablet Take 400 mg by mouth every 6 (six) hours as needed for headache or moderate pain.    Marland Kitchen ketotifen (ALAWAY) 0.025 % ophthalmic solution Place 1 drop into both eyes 2 (two) times daily.    Marland Kitchen loperamide (IMODIUM A-D) 2 MG capsule Take 2 mg by mouth daily as needed for diarrhea or loose stools.    Marland Kitchen omeprazole (PRILOSEC) 20  MG capsule Take 20 mg by mouth daily.     . phenazopyridine (PYRIDIUM) 100 MG tablet Take 1 tablet (100 mg total) by mouth 3 (three) times daily as needed for pain. 10 tablet 0  . Polyvinyl Alcohol-Povidone PF (REFRESH) 1.4-0.6 % SOLN Place 1 drop into both eyes daily as needed (dry eyes).     . potassium gluconate 595 MG TABS Take 595 mg by mouth daily.     . simvastatin (ZOCOR) 20 MG tablet Take 20 mg by mouth every morning.     . traMADol (ULTRAM) 50 MG tablet Take 1 tablet (50 mg total) by mouth every 6 (six) hours as needed. 15 tablet 1  . triamterene-hydrochlorothiazide (MAXZIDE-25) 37.5-25 MG per tablet Take 1 tablet by mouth every morning.      No current facility-administered medications for this visit.     OBJECTIVE: Older African-American woman in no acute distress  Vitals:   05/02/19 1259  BP: 115/78  Pulse: 75  Resp: 17  Temp: 98.5 F (36.9 C)  SpO2: 99%   Wt Readings from Last 3 Encounters:  05/02/19 165 lb 9.6 oz (75.1 kg)  03/21/19 164 lb 4 oz (74.5 kg)  03/07/19 162 lb 3.2 oz (73.6 kg)   Body mass index is 33.45 kg/m.    ECOG FS:1 - Symptomatic but completely ambulatory  Ocular: Sclerae unicteric, pupils round and equal Ear-nose-throat: Wearing a mask Lymphatic: No cervical or supraclavicular adenopathy Lungs no rales or rhonchi Heart regular rate and rhythm Abd soft, nontender, positive bowel sounds MSK no focal spinal tenderness, no joint edema Neuro: non-focal, well-oriented, appropriate affect Breasts: The right breast is status post lumpectomy and radiation, with no evidence of disease recurrence.  The left breast is benign.  Both axillae are benign.  LAB RESULTS:  CMP     Component Value Date/Time   NA 141 05/02/2019 1210   K 3.9 05/02/2019 1210   CL 106 05/02/2019 1210   CO2 26 05/02/2019 1210   GLUCOSE 99 05/02/2019 1210   BUN 26 (H) 05/02/2019 1210   CREATININE 1.36 (H) 05/02/2019 1210   CREATININE 1.18 (H) 01/17/2019 1305   CALCIUM 9.4  05/02/2019 1210   PROT 7.5 05/02/2019 1210   ALBUMIN 3.8 05/02/2019 1210   AST 23 05/02/2019 1210   AST 24 01/17/2019 1305   ALT 15 05/02/2019 1210   ALT 17 01/17/2019  1305   ALKPHOS 75 05/02/2019 1210   BILITOT 0.5 05/02/2019 1210   BILITOT 0.3 01/17/2019 1305   GFRNONAA 38 (L) 05/02/2019 1210   GFRNONAA 45 (L) 01/17/2019 1305   GFRAA 44 (L) 05/02/2019 1210   GFRAA 53 (L) 01/17/2019 1305    No results found for: TOTALPROTELP, ALBUMINELP, A1GS, A2GS, BETS, BETA2SER, GAMS, MSPIKE, SPEI  No results found for: KPAFRELGTCHN, LAMBDASER, KAPLAMBRATIO  Lab Results  Component Value Date   WBC 4.9 05/02/2019   NEUTROABS 2.9 05/02/2019   HGB 12.3 05/02/2019   HCT 36.9 05/02/2019   MCV 88.9 05/02/2019   PLT 191 05/02/2019    @LASTCHEMISTRY @  No results found for: LABCA2  No components found for: FGHWEX937  No results for input(s): INR in the last 168 hours.  No results found for: LABCA2  No results found for: JIR678  No results found for: LFY101  No results found for: BPZ025  No results found for: CA2729  No components found for: HGQUANT  No results found for: CEA1 / No results found for: CEA1   No results found for: AFPTUMOR  No results found for: CHROMOGRNA  No results found for: PSA1  Appointment on 05/02/2019  Component Date Value Ref Range Status  . Sodium 05/02/2019 141  135 - 145 mmol/L Final  . Potassium 05/02/2019 3.9  3.5 - 5.1 mmol/L Final  . Chloride 05/02/2019 106  98 - 111 mmol/L Final  . CO2 05/02/2019 26  22 - 32 mmol/L Final  . Glucose, Bld 05/02/2019 99  70 - 99 mg/dL Final  . BUN 05/02/2019 26* 8 - 23 mg/dL Final  . Creatinine, Ser 05/02/2019 1.36* 0.44 - 1.00 mg/dL Final  . Calcium 05/02/2019 9.4  8.9 - 10.3 mg/dL Final  . Total Protein 05/02/2019 7.5  6.5 - 8.1 g/dL Final  . Albumin 05/02/2019 3.8  3.5 - 5.0 g/dL Final  . AST 05/02/2019 23  15 - 41 U/L Final  . ALT 05/02/2019 15  0 - 44 U/L Final  . Alkaline Phosphatase 05/02/2019 75   38 - 126 U/L Final  . Total Bilirubin 05/02/2019 0.5  0.3 - 1.2 mg/dL Final  . GFR calc non Af Amer 05/02/2019 38* >60 mL/min Final  . GFR calc Af Amer 05/02/2019 44* >60 mL/min Final  . Anion gap 05/02/2019 9  5 - 15 Final   Performed at [facility], [place] [address], [place], [place] [address]  . WBC 05/02/2019 4.9  4.0 - 10.5 K/uL Final  . RBC 05/02/2019 4.15  3.87 - 5.11 MIL/uL Final  . Hemoglobin 05/02/2019 12.3  12.0 - 15.0 g/dL Final  . HCT 05/02/2019 36.9  36.0 - 46.0 % Final  . MCV 05/02/2019 88.9  80.0 - 100.0 fL Final  . MCH 05/02/2019 29.6  26.0 - 34.0 pg Final  . MCHC 05/02/2019 33.3  30.0 - 36.0 g/dL Final  . RDW 05/02/2019 13.0  11.5 - 15.5 % Final  . Platelets 05/02/2019 191  150 - 400 K/uL Final  . nRBC 05/02/2019 0.0  0.0 - 0.2 % Final  . Neutrophils Relative % 05/02/2019 60  % Final  . Neutro Abs 05/02/2019 2.9  1.7 - 7.7 K/uL Final  . Lymphocytes Relative 05/02/2019 29  % Final  . Lymphs Abs 05/02/2019 1.4  0.7 - 4.0 K/uL Final  . Monocytes Relative 05/02/2019 10  % Final  . Monocytes Absolute 05/02/2019 0.5  0.1 - 1.0 K/uL Final  . Eosinophils Relative 05/02/2019  1  % Final  . Eosinophils Absolute 05/02/2019 0.1  0.0 - 0.5 K/uL Final  . Basophils Relative 05/02/2019 0  % Final  . Basophils Absolute 05/02/2019 0.0  0.0 - 0.1 K/uL Final  . Immature Granulocytes 05/02/2019 0  % Final  . Abs Immature Granulocytes 05/02/2019 0.01  0.00 - 0.07 K/uL Final   Performed at [facility], [place] [address], [place], [place] [address]    (this displays the last labs from the last 3 days)  No results found for: TOTALPROTELP, ALBUMINELP, A1GS, A2GS, BETS, BETA2SER, GAMS, MSPIKE, SPEI (this displays SPEP labs)  No results found for: KPAFRELGTCHN, LAMBDASER, KAPLAMBRATIO (kappa/lambda light chains)  No results found for: HGBA, HGBA2QUANT, HGBFQUANT, HGBSQUAN (Hemoglobinopathy evaluation)   No results found for: LDH  No  results found for: IRON, TIBC, IRONPCTSAT (Iron and TIBC)  No results found for: FERRITIN  Urinalysis    Component Value Date/Time   COLORURINE STRAW (A) 07/27/2018 1050   APPEARANCEUR CLEAR 07/27/2018 1050   LABSPEC 1.011 07/27/2018 1050   PHURINE 6.0 07/27/2018 1050   GLUCOSEU NEGATIVE 07/27/2018 1050   HGBUR SMALL (A) 07/27/2018 1050   BILIRUBINUR NEGATIVE 07/27/2018 1050   KETONESUR NEGATIVE 07/27/2018 1050   PROTEINUR NEGATIVE 07/27/2018 1050   UROBILINOGEN 1.0 11/21/2012 1402   NITRITE NEGATIVE 07/27/2018 1050   LEUKOCYTESUR MODERATE (A) 07/27/2018 1050     STUDIES:  No results found.   ELIGIBLE FOR AVAILABLE RESEARCH PROTOCOL: PREVENT  ASSESSMENT: 73 y.o. [place] woman status post right breast upper outer quadrant biopsy 06/02/2018 for a clinical T1b N0, stage IB invasive ductal carcinoma, grade 2, triple negative, with an MIB-1 of 10%  (1) status post right lumpectomy and sentinel lymph node sampling 07/02/2018 for a pT1b pN0,stage IA invasive ductal carcinoma, grade 2, now HER-2 positive, still estrogen and progesterone receptor negative, with an MIB-1 of 1%  (2) adjuvant chemotherapy consisting of paclitaxel x12 with trastuzumab given weekly started 07/27/2018  (a) paclitaxel discontinued after 3 doses because of neuropathy-- last dose 08/09/2018  (3) trastuzumab to continue to total 1 year (through December 2020)  (a) echocardiogram 07/22/2018 showed an ejection fraction in the 55-60% range.  (b) Echocardiogram on 12/14/2018 shows EF of 55-60%  (c) echo 03/03/2019 shows ejection fraction in the 50-55% range  (3) adjuvant radiation completed on 10/15/2018  (4) genetics testing pending   PLAN: Rachel Kelly is now nearly a year out from definitive surgery for breast cancer with no evidence of disease recurrence.  This is very favorable.  She is tolerating trastuzumab remarkably well and will complete her year of treatment on 07/04/2019.  She qualifies for genetics  testing given the new family information and I have asked our genetics counselors to arrange for her to be tested when she returns for her next Herceptin treatment in 3 weeks.  She will see me July 04, 2019 with her last Herceptin treatment after which her follow-up here will be liberalized  She knows to call for any other issue that may develop before the next visit.  , [name], MD  05/02/19 1:15 PM Medical Oncology and Hematology [facility] [place],  [address] Tel. [phone]    Fax. [phone]  I, Jacqualyn Posey am acting as a Education administrator for Chauncey Cruel, MD.   I, Lurline Del MD, have reviewed the above documentation for accuracy and completeness, and I agree with the above.

## 2019-05-02 ENCOUNTER — Inpatient Hospital Stay (HOSPITAL_BASED_OUTPATIENT_CLINIC_OR_DEPARTMENT_OTHER): Payer: Medicare Other | Admitting: Oncology

## 2019-05-02 ENCOUNTER — Encounter: Payer: Self-pay | Admitting: *Deleted

## 2019-05-02 ENCOUNTER — Inpatient Hospital Stay: Payer: Medicare Other | Attending: Oncology

## 2019-05-02 ENCOUNTER — Other Ambulatory Visit: Payer: Self-pay | Admitting: Orthopaedic Surgery

## 2019-05-02 ENCOUNTER — Inpatient Hospital Stay: Payer: Medicare Other

## 2019-05-02 ENCOUNTER — Other Ambulatory Visit: Payer: Self-pay

## 2019-05-02 VITALS — BP 115/78 | HR 75 | Temp 98.5°F | Resp 17 | Ht 59.0 in | Wt 165.6 lb

## 2019-05-02 DIAGNOSIS — I10 Essential (primary) hypertension: Secondary | ICD-10-CM | POA: Diagnosis not present

## 2019-05-02 DIAGNOSIS — D869 Sarcoidosis, unspecified: Secondary | ICD-10-CM | POA: Diagnosis not present

## 2019-05-02 DIAGNOSIS — Z5112 Encounter for antineoplastic immunotherapy: Secondary | ICD-10-CM | POA: Insufficient documentation

## 2019-05-02 DIAGNOSIS — C50411 Malignant neoplasm of upper-outer quadrant of right female breast: Secondary | ICD-10-CM

## 2019-05-02 DIAGNOSIS — Z171 Estrogen receptor negative status [ER-]: Secondary | ICD-10-CM | POA: Diagnosis not present

## 2019-05-02 DIAGNOSIS — Z95828 Presence of other vascular implants and grafts: Secondary | ICD-10-CM

## 2019-05-02 DIAGNOSIS — Z79899 Other long term (current) drug therapy: Secondary | ICD-10-CM | POA: Diagnosis not present

## 2019-05-02 DIAGNOSIS — M4326 Fusion of spine, lumbar region: Secondary | ICD-10-CM

## 2019-05-02 LAB — COMPREHENSIVE METABOLIC PANEL
ALT: 15 U/L (ref 0–44)
AST: 23 U/L (ref 15–41)
Albumin: 3.8 g/dL (ref 3.5–5.0)
Alkaline Phosphatase: 75 U/L (ref 38–126)
Anion gap: 9 (ref 5–15)
BUN: 26 mg/dL — ABNORMAL HIGH (ref 8–23)
CO2: 26 mmol/L (ref 22–32)
Calcium: 9.4 mg/dL (ref 8.9–10.3)
Chloride: 106 mmol/L (ref 98–111)
Creatinine, Ser: 1.36 mg/dL — ABNORMAL HIGH (ref 0.44–1.00)
GFR calc Af Amer: 44 mL/min — ABNORMAL LOW (ref >60)
GFR calc non Af Amer: 38 mL/min — ABNORMAL LOW (ref >60)
Glucose, Bld: 99 mg/dL (ref 70–99)
Potassium: 3.9 mmol/L (ref 3.5–5.1)
Sodium: 141 mmol/L (ref 135–145)
Total Bilirubin: 0.5 mg/dL (ref 0.3–1.2)
Total Protein: 7.5 g/dL (ref 6.5–8.1)

## 2019-05-02 LAB — CBC WITH DIFFERENTIAL/PLATELET
Abs Immature Granulocytes: 0.01 10*3/uL (ref 0.00–0.07)
Basophils Absolute: 0 10*3/uL (ref 0.0–0.1)
Basophils Relative: 0 %
Eosinophils Absolute: 0.1 10*3/uL (ref 0.0–0.5)
Eosinophils Relative: 1 %
HCT: 36.9 % (ref 36.0–46.0)
Hemoglobin: 12.3 g/dL (ref 12.0–15.0)
Immature Granulocytes: 0 %
Lymphocytes Relative: 29 %
Lymphs Abs: 1.4 10*3/uL (ref 0.7–4.0)
MCH: 29.6 pg (ref 26.0–34.0)
MCHC: 33.3 g/dL (ref 30.0–36.0)
MCV: 88.9 fL (ref 80.0–100.0)
Monocytes Absolute: 0.5 10*3/uL (ref 0.1–1.0)
Monocytes Relative: 10 %
Neutro Abs: 2.9 10*3/uL (ref 1.7–7.7)
Neutrophils Relative %: 60 %
Platelets: 191 10*3/uL (ref 150–400)
RBC: 4.15 MIL/uL (ref 3.87–5.11)
RDW: 13 % (ref 11.5–15.5)
WBC: 4.9 10*3/uL (ref 4.0–10.5)
nRBC: 0 % (ref 0.0–0.2)

## 2019-05-02 MED ORDER — SODIUM CHLORIDE 0.9 % IV SOLN
Freq: Once | INTRAVENOUS | Status: AC
  Administered 2019-05-02: 14:00:00 via INTRAVENOUS
  Filled 2019-05-02: qty 250

## 2019-05-02 MED ORDER — DIPHENHYDRAMINE HCL 25 MG PO CAPS
25.0000 mg | ORAL_CAPSULE | Freq: Once | ORAL | Status: AC
  Administered 2019-05-02: 25 mg via ORAL

## 2019-05-02 MED ORDER — SODIUM CHLORIDE 0.9% FLUSH
10.0000 mL | INTRAVENOUS | Status: DC | PRN
  Administered 2019-05-02: 10 mL
  Filled 2019-05-02: qty 10

## 2019-05-02 MED ORDER — HEPARIN SOD (PORK) LOCK FLUSH 100 UNIT/ML IV SOLN
500.0000 [IU] | Freq: Once | INTRAVENOUS | Status: AC | PRN
  Administered 2019-05-02: 500 [IU]
  Filled 2019-05-02: qty 5

## 2019-05-02 MED ORDER — ACETAMINOPHEN 325 MG PO TABS
ORAL_TABLET | ORAL | Status: AC
  Filled 2019-05-02: qty 2

## 2019-05-02 MED ORDER — ACETAMINOPHEN 325 MG PO TABS
650.0000 mg | ORAL_TABLET | Freq: Once | ORAL | Status: AC
  Administered 2019-05-02: 650 mg via ORAL

## 2019-05-02 MED ORDER — SODIUM CHLORIDE 0.9% FLUSH
10.0000 mL | Freq: Once | INTRAVENOUS | Status: AC
  Administered 2019-05-02: 10 mL
  Filled 2019-05-02: qty 10

## 2019-05-02 MED ORDER — DIPHENHYDRAMINE HCL 25 MG PO CAPS
ORAL_CAPSULE | ORAL | Status: AC
  Filled 2019-05-02: qty 1

## 2019-05-02 MED ORDER — TRASTUZUMAB-ANNS CHEMO 150 MG IV SOLR
450.0000 mg | Freq: Once | INTRAVENOUS | Status: AC
  Administered 2019-05-02: 450 mg via INTRAVENOUS
  Filled 2019-05-02: qty 21.43

## 2019-05-02 NOTE — Patient Instructions (Signed)
[facility] Discharge Instructions for Patients Receiving Chemotherapy  Today you received the following chemotherapy agent: Trastuzumab.  To help prevent nausea and vomiting after your treatment, we encourage you to take your nausea medication as directed.   If you develop nausea and vomiting that is not controlled by your nausea medication, call the clinic.   BELOW ARE SYMPTOMS THAT SHOULD BE REPORTED IMMEDIATELY:  *FEVER GREATER THAN 100.5 F  *CHILLS WITH OR WITHOUT FEVER  NAUSEA AND VOMITING THAT IS NOT CONTROLLED WITH YOUR NAUSEA MEDICATION  *UNUSUAL SHORTNESS OF BREATH  *UNUSUAL BRUISING OR BLEEDING  TENDERNESS IN MOUTH AND THROAT WITH OR WITHOUT PRESENCE OF ULCERS  *URINARY PROBLEMS  *BOWEL PROBLEMS  UNUSUAL RASH Items with * indicate a potential emergency and should be followed up as soon as possible.  Feel free to call the clinic should you have any questions or concerns. The clinic phone number is [phone].  Please show the [place] at check-in to the Emergency Department and triage nurse.

## 2019-05-03 ENCOUNTER — Telehealth: Payer: Self-pay | Admitting: Oncology

## 2019-05-03 ENCOUNTER — Telehealth: Payer: Self-pay | Admitting: Licensed Clinical Social Worker

## 2019-05-03 NOTE — Telephone Encounter (Signed)
I talk with patient regarding schedule  

## 2019-05-04 NOTE — Telephone Encounter (Signed)
Scheduled genetic counseling appointment for 11/2 at 11 am, this will be walk-in visit.

## 2019-05-20 ENCOUNTER — Telehealth: Payer: Self-pay | Admitting: Licensed Clinical Social Worker

## 2019-05-20 NOTE — Telephone Encounter (Signed)
Called patient regarding upcoming [facility] appointment, per patient's request this will be a walk-in visit due to patient having other appointments at the facility.

## 2019-05-23 ENCOUNTER — Encounter: Payer: Self-pay | Admitting: *Deleted

## 2019-05-23 ENCOUNTER — Inpatient Hospital Stay (HOSPITAL_BASED_OUTPATIENT_CLINIC_OR_DEPARTMENT_OTHER): Payer: Medicare Other | Admitting: Licensed Clinical Social Worker

## 2019-05-23 ENCOUNTER — Inpatient Hospital Stay: Payer: Medicare Other

## 2019-05-23 ENCOUNTER — Other Ambulatory Visit: Payer: Self-pay

## 2019-05-23 ENCOUNTER — Encounter: Payer: Self-pay | Admitting: Licensed Clinical Social Worker

## 2019-05-23 ENCOUNTER — Inpatient Hospital Stay: Payer: Medicare Other | Attending: Oncology

## 2019-05-23 VITALS — BP 111/80 | HR 66 | Temp 98.2°F | Resp 17

## 2019-05-23 DIAGNOSIS — Z79899 Other long term (current) drug therapy: Secondary | ICD-10-CM | POA: Diagnosis not present

## 2019-05-23 DIAGNOSIS — Z8042 Family history of malignant neoplasm of prostate: Secondary | ICD-10-CM

## 2019-05-23 DIAGNOSIS — C50411 Malignant neoplasm of upper-outer quadrant of right female breast: Secondary | ICD-10-CM

## 2019-05-23 DIAGNOSIS — Z5112 Encounter for antineoplastic immunotherapy: Secondary | ICD-10-CM | POA: Diagnosis not present

## 2019-05-23 DIAGNOSIS — Z171 Estrogen receptor negative status [ER-]: Secondary | ICD-10-CM

## 2019-05-23 DIAGNOSIS — Z803 Family history of malignant neoplasm of breast: Secondary | ICD-10-CM

## 2019-05-23 DIAGNOSIS — Z95828 Presence of other vascular implants and grafts: Secondary | ICD-10-CM

## 2019-05-23 LAB — CBC WITH DIFFERENTIAL/PLATELET
Abs Immature Granulocytes: 0.01 10*3/uL (ref 0.00–0.07)
Basophils Absolute: 0 10*3/uL (ref 0.0–0.1)
Basophils Relative: 0 %
Eosinophils Absolute: 0.1 10*3/uL (ref 0.0–0.5)
Eosinophils Relative: 1 %
HCT: 36.1 % (ref 36.0–46.0)
Hemoglobin: 12 g/dL (ref 12.0–15.0)
Immature Granulocytes: 0 %
Lymphocytes Relative: 31 %
Lymphs Abs: 1.6 10*3/uL (ref 0.7–4.0)
MCH: 30.2 pg (ref 26.0–34.0)
MCHC: 33.2 g/dL (ref 30.0–36.0)
MCV: 90.7 fL (ref 80.0–100.0)
Monocytes Absolute: 0.4 10*3/uL (ref 0.1–1.0)
Monocytes Relative: 8 %
Neutro Abs: 3.1 10*3/uL (ref 1.7–7.7)
Neutrophils Relative %: 60 %
Platelets: 173 10*3/uL (ref 150–400)
RBC: 3.98 MIL/uL (ref 3.87–5.11)
RDW: 12.9 % (ref 11.5–15.5)
WBC: 5.2 10*3/uL (ref 4.0–10.5)
nRBC: 0 % (ref 0.0–0.2)

## 2019-05-23 LAB — COMPREHENSIVE METABOLIC PANEL
ALT: 19 U/L (ref 0–44)
AST: 23 U/L (ref 15–41)
Albumin: 3.7 g/dL (ref 3.5–5.0)
Alkaline Phosphatase: 75 U/L (ref 38–126)
Anion gap: 8 (ref 5–15)
BUN: 17 mg/dL (ref 8–23)
CO2: 24 mmol/L (ref 22–32)
Calcium: 8.9 mg/dL (ref 8.9–10.3)
Chloride: 107 mmol/L (ref 98–111)
Creatinine, Ser: 1.17 mg/dL — ABNORMAL HIGH (ref 0.44–1.00)
GFR calc Af Amer: 53 mL/min — ABNORMAL LOW (ref >60)
GFR calc non Af Amer: 46 mL/min — ABNORMAL LOW (ref >60)
Glucose, Bld: 107 mg/dL — ABNORMAL HIGH (ref 70–99)
Potassium: 4.1 mmol/L (ref 3.5–5.1)
Sodium: 139 mmol/L (ref 135–145)
Total Bilirubin: 0.4 mg/dL (ref 0.3–1.2)
Total Protein: 7.2 g/dL (ref 6.5–8.1)

## 2019-05-23 MED ORDER — SODIUM CHLORIDE 0.9 % IV SOLN
Freq: Once | INTRAVENOUS | Status: AC
  Administered 2019-05-23: 13:00:00 via INTRAVENOUS
  Filled 2019-05-23: qty 250

## 2019-05-23 MED ORDER — SODIUM CHLORIDE 0.9% FLUSH
10.0000 mL | INTRAVENOUS | Status: DC | PRN
  Administered 2019-05-23: 10 mL
  Filled 2019-05-23: qty 10

## 2019-05-23 MED ORDER — TRASTUZUMAB-ANNS CHEMO 150 MG IV SOLR
450.0000 mg | Freq: Once | INTRAVENOUS | Status: AC
  Administered 2019-05-23: 450 mg via INTRAVENOUS
  Filled 2019-05-23: qty 21.43

## 2019-05-23 MED ORDER — DIPHENHYDRAMINE HCL 25 MG PO CAPS
25.0000 mg | ORAL_CAPSULE | Freq: Once | ORAL | Status: AC
  Administered 2019-05-23: 25 mg via ORAL

## 2019-05-23 MED ORDER — ACETAMINOPHEN 325 MG PO TABS
650.0000 mg | ORAL_TABLET | Freq: Once | ORAL | Status: AC
  Administered 2019-05-23: 650 mg via ORAL

## 2019-05-23 MED ORDER — HEPARIN SOD (PORK) LOCK FLUSH 100 UNIT/ML IV SOLN
500.0000 [IU] | Freq: Once | INTRAVENOUS | Status: AC | PRN
  Administered 2019-05-23: 500 [IU]
  Filled 2019-05-23: qty 5

## 2019-05-23 MED ORDER — DIPHENHYDRAMINE HCL 25 MG PO CAPS
ORAL_CAPSULE | ORAL | Status: AC
  Filled 2019-05-23: qty 1

## 2019-05-23 MED ORDER — ACETAMINOPHEN 325 MG PO TABS
ORAL_TABLET | ORAL | Status: AC
  Filled 2019-05-23: qty 2

## 2019-05-23 MED ORDER — SODIUM CHLORIDE 0.9% FLUSH
10.0000 mL | Freq: Once | INTRAVENOUS | Status: AC
  Administered 2019-05-23: 10 mL
  Filled 2019-05-23: qty 10

## 2019-05-23 NOTE — Patient Instructions (Signed)
[facility] Discharge Instructions for Patients Receiving Chemotherapy  Today you received the following chemotherapy agent: Trastuzumab.  To help prevent nausea and vomiting after your treatment, we encourage you to take your nausea medication as directed.   If you develop nausea and vomiting that is not controlled by your nausea medication, call the clinic.   BELOW ARE SYMPTOMS THAT SHOULD BE REPORTED IMMEDIATELY:  *FEVER GREATER THAN 100.5 F  *CHILLS WITH OR WITHOUT FEVER  NAUSEA AND VOMITING THAT IS NOT CONTROLLED WITH YOUR NAUSEA MEDICATION  *UNUSUAL SHORTNESS OF BREATH  *UNUSUAL BRUISING OR BLEEDING  TENDERNESS IN MOUTH AND THROAT WITH OR WITHOUT PRESENCE OF ULCERS  *URINARY PROBLEMS  *BOWEL PROBLEMS  UNUSUAL RASH Items with * indicate a potential emergency and should be followed up as soon as possible.  Feel free to call the clinic should you have any questions or concerns. The clinic phone number is [phone].  Please show the [place] at check-in to the Emergency Department and triage nurse.

## 2019-05-23 NOTE — Progress Notes (Signed)
REFERRING PROVIDER: Chauncey Cruel, MD [place],  [place] [id number]  PRIMARY PROVIDER:  [name], MD  PRIMARY REASON FOR VISIT:  1. Malignant neoplasm of upper-outer quadrant of right breast in female, estrogen receptor negative ([place])   2. Family history of prostate cancer   3. Family history of breast cancer      HISTORY OF PRESENT ILLNESS:   Ms. Escandon, a 73 y.o. female, was seen for a [facility] cancer genetics consultation at the request of [name] due to a personal and family history of cancer.  Ms. Howk presents to clinic today to discuss the possibility of a hereditary predisposition to cancer, genetic testing, and to further clarify her future cancer risks, as well as potential cancer risks for family members.   In 2019, at the age of 73, Ms. Antonetti was diagnosed with IDC of the right breast, triple negative by biopsy but Her2+ after lumpectomy on 07/02/2018. She has had chemotherapy and radiation.   CANCER HISTORY:  Oncology History  Malignant neoplasm of upper-outer quadrant of right breast in female, estrogen receptor negative ([place])  06/07/2018 Initial Diagnosis   Malignant neoplasm of upper-outer quadrant of right breast in female, estrogen receptor negative ([place])   07/27/2018 - 07/27/2018 Chemotherapy   The patient had DOXOrubicin (ADRIAMYCIN) chemo injection 106 mg, 60 mg/m2, Intravenous,  Once, 0 of 4 cycles PALONOSETRON HCL INJECTION 0.25 MG/5ML, 0.25 mg, Intravenous,  Once, 0 of 4 cycles pegfilgrastim (NEULASTA) injection 6 mg, 6 mg, Subcutaneous, Once, 0 of 4 cycles cyclophosphamide (CYTOXAN) 1,060 mg in sodium chloride 0.9 % 250 mL chemo infusion, 600 mg/m2, Intravenous,  Once, 0 of 4 cycles FOSAPREPITANT 150MG + DEXAMETHASONE INFUSION [facility], , Intravenous,  Once, 0 of 4 cycles  for chemotherapy treatment.    07/27/2018 - 08/09/2018 Chemotherapy   The patient had trastuzumab (HERCEPTIN) 315 mg in sodium chloride 0.9 % 250 mL chemo  infusion, 4 mg/kg = 315 mg, Intravenous,  Once, 1 of 16 cycles Administration: 150 mg (08/02/2018), 150 mg (08/09/2018) PACLitaxel (TAXOL) 144 mg in sodium chloride 0.9 % 250 mL chemo infusion (</= 82m/m2), 80 mg/m2 = 144 mg, Intravenous,  Once, 1 of 3 cycles Administration: 144 mg (07/27/2018), 144 mg (08/02/2018), 144 mg (08/09/2018) trastuzumab-anns (KANJINTI) 315 mg in sodium chloride 0.9 % 250 mL chemo infusion, 4 mg/kg = 315 mg (100 % of original dose 4 mg/kg), Intravenous,  Once, 1 of 1 cycle Dose modification: 4 mg/kg (original dose 4 mg/kg, Cycle 1, Reason: Other (see comments), Comment: insurance), 4 mg/kg (original dose 4 mg/kg, Cycle 1, Reason: Other (see comments)) Administration: 300 mg (07/27/2018)  for chemotherapy treatment.    08/30/2018 -  Chemotherapy   The patient had trastuzumab (HERCEPTIN) 300 mg in sodium chloride 0.9 % 250 mL chemo infusion, 294 mg (50 % of original dose 8 mg/kg), Intravenous,  Once, 10 of 10 cycles Dose modification: 6 mg/kg (original dose 8 mg/kg, Cycle 1, Reason: Provider Judgment), 4 mg/kg (original dose 8 mg/kg, Cycle 1, Reason: Provider Judgment) Administration: 300 mg (08/30/2018), 450 mg (09/13/2018), 450 mg (11/15/2018), 450 mg (10/04/2018), 450 mg (10/25/2018), 450 mg (12/06/2018), 450 mg (12/27/2018), 450 mg (01/17/2019), 450 mg (02/07/2019), 450 mg (02/28/2019) trastuzumab-anns (KANJINTI) 450 mg in sodium chloride 0.9 % 250 mL chemo infusion, 441 mg (100 % of original dose 6 mg/kg), Intravenous,  Once, 3 of 6 cycles Dose modification: 6 mg/kg (original dose 6 mg/kg, Cycle 11, Reason: Other (see comments)) Administration: 450 mg (03/21/2019), 450 mg (  04/11/2019), 450 mg (05/02/2019)  for chemotherapy treatment.    09/01/2018 Cancer Staging   Staging form: Breast, AJCC 8th Edition - Pathologic: Stage IA (pT1b, pN0, cM0, G2, ER-, PR-, HER2+) - Signed by Eppie Gibson, MD on 09/01/2018      RISK FACTORS:  Menarche was at age 58.  First live birth at age 40.  OCP  use: yes.  Ovaries intact: no.  Hysterectomy: yes.  Menopausal status: postmenopausal.  HRT use: 0 years. Colonoscopy: yes; normal. Mammogram within the last year: yes.   Past Medical History:  Diagnosis Date  . Anemia   . Blood transfusion 2005  . Family history of breast cancer   . Family history of prostate cancer   . Hernia   . HH (hiatus hernia)   . Hyperlipidemia   . Hypertension   . PAH (pulmonary artery hypertension) ([place])   . Sarcoidosis   . Shortness of breath    on exertion  . Sleep apnea    does not use cpap  . Wheezing     Past Surgical History:  Procedure Laterality Date  . ABDOMINAL HYSTERECTOMY  1988  . BACK SURGERY  2005   lower  . BREAST LUMPECTOMY WITH RADIOACTIVE SEED AND SENTINEL LYMPH NODE BIOPSY Right 07/02/2018   Procedure: RIGHT BREAST RADIOACTIVE SEED LUMPECTOMY X2 AND RIGHT AXILLARY  SENTINEL LYMPH NODE BIOPSY;  Surgeon: [name], MD;  Location: [facility];  Service: General;  Laterality: Right;  . ESOPHAGOGASTRODUODENOSCOPY  06/24/2012   Procedure: ESOPHAGOGASTRODUODENOSCOPY (EGD);  Surgeon: [name], MD;  Location: [name];  Service: General;  Laterality: N/A;  . HIATAL HERNIA REPAIR  08/18/2012   Procedure: LAPAROSCOPIC REPAIR OF HIATAL HERNIA;  Surgeon: [name], MD;  Location: [facility];  Service: General;  Laterality: N/A;  Laparoscopic Hiatal Hernia Repair   . INSERTION OF MESH  08/18/2012   Procedure: INSERTION OF MESH;  Surgeon: [name], MD;  Location: [facility];  Service: General;;  . IR IMAGING GUIDED PORT INSERTION  07/19/2018  . PORTACATH PLACEMENT N/A 07/02/2018   Procedure: INSERTION PORT-A-CATH TRIED NO PORT IN PLACE;  Surgeon: [name], MD;  Location: [facility];  Service: General;  Laterality: N/A;  . VESICOVAGINAL FISTULA CLOSURE W/ TAH      Social History   Socioeconomic History  . Marital status: Single    Spouse name: Not on file  . Number of children: Not on file  . Years of  education: Not on file  . Highest education level: Not on file  Occupational History  . Occupation: Retired Tour manager  . Financial resource strain: Not on file  . Food insecurity    Worry: Not on file    Inability: Not on file  . Transportation needs    Medical: No    Non-medical: No  Tobacco Use  . Smoking status: Never Smoker  . Smokeless tobacco: Never Used  Substance and Sexual Activity  . Alcohol use: No  . Drug use: No  . Sexual activity: Not on file  Lifestyle  . Physical activity    Days per week: Not on file    Minutes per session: Not on file  . Stress: Not on file  Relationships  . Social Herbalist on phone: Not on file    Gets together: Not on file    Attends religious service: Not on file    Active member of club or organization: Not on file  Attends meetings of clubs or organizations: Not on file    Relationship status: Not on file  Other Topics Concern  . Not on file  Social History Narrative  . Not on file     FAMILY HISTORY:  We obtained a detailed, 4-generation family history.  Significant diagnoses are listed below: Family History  Problem Relation Age of Onset  . Clotting disorder Mother        PE  . Heart disease Father   . Breast cancer Maternal Grandmother        spread to brain  . Coronary artery disease Other   . Prostate cancer Brother 44  . Breast cancer Maternal Aunt   . Cancer Maternal Aunt        unk type  . Prostate cancer Paternal Grandfather   . Prostate cancer Brother    Ms. Rizo has one daughter, Deatra Canter, who is living at 62 with no history of cancer. Patient has 3 brothers. One of her brothers was recently diagnosed with prostate cancer at the beginning of 2020, he is 40. Another brother has also had prostate cancer.   Ms. Blando mother is living at 20. Patient had 3 maternal aunts, 5 maternal uncles. One of her aunts had breast cancer, unsure age of diagnosis. She also had another cancer but is  unsure the type. No known cancers in maternal cousins. Maternal grandmother had breast cancer, unsure age of diagnosis, died at 47. Her maternal grandfather died of a stroke.   Ms. Williams father died of heart issues at 50. Patient had 1 paternal aunt, no cancers. No cancers for her 1 paternal cousin. Her paternal grandfather did have prostate cancer, unsure age of diagnosis. She was unsure when her paternal grandmother passed.  Ms. Delahunt is unaware of previous family history of genetic testing for hereditary cancer risks. There is no reported Ashkenazi Jewish ancestry. There is no known consanguinity.  GENETIC COUNSELING ASSESSMENT: Ms. Villarruel is a 74 y.o. female with a personal and family history which is somewhat suggestive of a hereditary cancer syndrome and predisposition to cancer. We, therefore, discussed and recommended the following at today's visit.   DISCUSSION: We discussed that 5 - 10% of breast cancer is hereditary, with most cases associated with BRCA1/BRCA2 mutations.  There are other genes that can be associated with hereditary breast cancer syndromes. We discussed that testing is beneficial for several reasons including knowing how to follow individuals after completing their treatment, and understand if other family members could be at risk for cancer and allow them to undergo genetic testing.   We reviewed the characteristics, features and inheritance patterns of hereditary cancer syndromes. We also discussed genetic testing, including the appropriate family members to test, the process of testing, insurance coverage and turn-around-time for results. We discussed the implications of a negative, positive and/or variant of uncertain significant result. We recommended Ms. Nabi pursue genetic testing for the [facility] Common Hereditary Cancers gene panel.   The Common Hereditary Cancers Panel offered by [facility] includes sequencing and/or deletion duplication testing of the following  48 genes: APC, ATM, AXIN2, BARD1, BMPR1A, BRCA1, BRCA2, BRIP1, CDH1, CDKN2A (p14ARF), CDKN2A (p16INK4a), CKD4, CHEK2, CTNNA1, DICER1, EPCAM (Deletion/duplication testing only), GREM1 (promoter region deletion/duplication testing only), KIT, MEN1, MLH1, MSH2, MSH3, MSH6, MUTYH, NBN, NF1, NHTL1, PALB2, PDGFRA, PMS2, POLD1, POLE, PTEN, RAD50, RAD51C, RAD51D, RNF43, SDHB, SDHC, SDHD, SMAD4, SMARCA4. STK11, TP53, TSC1, TSC2, and VHL.  The following genes were evaluated for sequence changes only: SDHA and HOXB13 c.251G>A  variant only.  Based on Ms. Wisdom personal and family history of cancer, she meets medical criteria for genetic testing. Despite that she meets criteria, she may still have an out of pocket cost. A billing investigation was done before our appointment, and the lab has estimated her OOP to be $40.  PLAN: After considering the risks, benefits, and limitations, Ms. Hanel provided informed consent to pursue genetic testing and the blood sample was sent to [facility] for analysis of the Common Hereditary Cancers Panel. Results should be available within approximately 2-3 weeks' time, at which point they will be disclosed by telephone to Ms. Balthaser, as will any additional recommendations warranted by these results. Ms. Galasso will receive a summary of her genetic counseling visit and a copy of her results once available. This information will also be available in Epic.   Ms. Bayless questions were answered to her satisfaction today. Our contact information was provided should additional questions or concerns arise. Thank you for the referral and allowing [place] to share in the care of your patient.   Faith Rogue, MS, [facility] Genetic Counselor [email or web address] Phone: [phone]  The patient was seen for a total of 25 minutes in face-to-face genetic counseling.  Drs. [name], Lindi Adie and/or Burr Medico were available for discussion regarding this case.    _______________________________________________________________________ For Office Staff:  Number of people involved in session: 1 Was an Intern/ student involved with case: no

## 2019-05-26 ENCOUNTER — Other Ambulatory Visit: Payer: Self-pay

## 2019-05-26 DIAGNOSIS — Z20822 Contact with and (suspected) exposure to covid-19: Secondary | ICD-10-CM

## 2019-05-27 LAB — NOVEL CORONAVIRUS, NAA: SARS-CoV-2, NAA: NOT DETECTED

## 2019-05-30 ENCOUNTER — Other Ambulatory Visit: Payer: Self-pay

## 2019-05-30 ENCOUNTER — Ambulatory Visit
Admission: RE | Admit: 2019-05-30 | Discharge: 2019-05-30 | Disposition: A | Discharge status: Home / Self Care | Payer: Medicare Other | Source: Ambulatory Visit | Attending: Adult Health | Admitting: Adult Health

## 2019-05-30 DIAGNOSIS — C50411 Malignant neoplasm of upper-outer quadrant of right female breast: Secondary | ICD-10-CM

## 2019-05-30 DIAGNOSIS — Z171 Estrogen receptor negative status [ER-]: Secondary | ICD-10-CM

## 2019-05-30 HISTORY — DX: Personal history of irradiation: Z92.3

## 2019-05-30 HISTORY — DX: Personal history of antineoplastic chemotherapy: Z92.21

## 2019-06-02 ENCOUNTER — Telehealth: Payer: Self-pay | Admitting: Licensed Clinical Social Worker

## 2019-06-08 ENCOUNTER — Ambulatory Visit (HOSPITAL_COMMUNITY)
Admission: RE | Admit: 2019-06-08 | Discharge: 2019-06-08 | Disposition: A | Discharge status: Home / Self Care | Payer: Medicare Other | Source: Ambulatory Visit | Attending: Internal Medicine | Admitting: Internal Medicine

## 2019-06-08 ENCOUNTER — Encounter (HOSPITAL_COMMUNITY): Payer: Self-pay | Admitting: Internal Medicine

## 2019-06-08 ENCOUNTER — Ambulatory Visit (HOSPITAL_BASED_OUTPATIENT_CLINIC_OR_DEPARTMENT_OTHER)
Admission: RE | Admit: 2019-06-08 | Discharge: 2019-06-08 | Disposition: A | Discharge status: Home / Self Care | Payer: Medicare Other | Source: Ambulatory Visit | Attending: Internal Medicine | Admitting: Internal Medicine

## 2019-06-08 ENCOUNTER — Other Ambulatory Visit: Payer: Self-pay

## 2019-06-08 VITALS — BP 125/80 | HR 68 | Wt 166.8 lb

## 2019-06-08 DIAGNOSIS — I081 Rheumatic disorders of both mitral and tricuspid valves: Secondary | ICD-10-CM | POA: Diagnosis not present

## 2019-06-08 DIAGNOSIS — C50411 Malignant neoplasm of upper-outer quadrant of right female breast: Secondary | ICD-10-CM

## 2019-06-08 DIAGNOSIS — Z171 Estrogen receptor negative status [ER-]: Secondary | ICD-10-CM

## 2019-06-08 DIAGNOSIS — E785 Hyperlipidemia, unspecified: Secondary | ICD-10-CM | POA: Insufficient documentation

## 2019-06-08 DIAGNOSIS — G4730 Sleep apnea, unspecified: Secondary | ICD-10-CM | POA: Insufficient documentation

## 2019-06-08 DIAGNOSIS — I110 Hypertensive heart disease with heart failure: Secondary | ICD-10-CM | POA: Insufficient documentation

## 2019-06-08 DIAGNOSIS — I509 Heart failure, unspecified: Secondary | ICD-10-CM | POA: Insufficient documentation

## 2019-06-08 DIAGNOSIS — D869 Sarcoidosis, unspecified: Secondary | ICD-10-CM | POA: Diagnosis not present

## 2019-06-08 DIAGNOSIS — I10 Essential (primary) hypertension: Secondary | ICD-10-CM | POA: Diagnosis not present

## 2019-06-08 NOTE — Addendum Note (Signed)
Encounter addended by: Scarlette Calico, RN on: 06/08/2019 11:16 AM  Actions taken: Diagnosis association updated, Order list changed, Clinical Note Signed

## 2019-06-08 NOTE — Progress Notes (Signed)
CARDIO-ONCOLOGY CLINIC CONSULT NOTE PCP: Dr. [name] Referring Physician: [name] Primary Cardiologist:  HPI: Rachel Kelly is a 73 y.o. female with h/o obesity, recently-diagnosed HTN, OSA (cannot tolerate CPAP)and right breast cancer referred by [name] for enrollment into the Cardio-Oncology program.  Diagnosed with right breast cancer diagnosed 11/19  Breast Cancer course   (1) status post right lumpectomy and sentinel lymph node sampling 07/02/2018 for a pT1b pN0,stage IA invasive ductal carcinoma, grade 2, now HER-2 positive, still estrogen and progesterone receptor negative, with an MIB-1 of 1%  (2) adjuvant chemotherapy consisting of paclitaxel x12 with trastuzumab given weekly started 07/27/2018             (a) paclitaxel discontinued after 3 doses because of neuropathy-- last dose 08/09/2018  (3) trastuzumab to continue to total 1 year (through December 2020)    (3) adjuvant radiation completed on 10/15/2018   Remains on Herceptin. (Will finish next month). Feels good. No SOB, orthopnea, PND or edema. Walking regularly without problem. Taking BP regularly SBP 95-125/70-80s   Echo today EF stable at 50-55% GLS unchanged at 17.4%  ECHO 1/20 EF 55-60% ECHO 12/14/18 EF 50-55%   GLS -14% (underestimated) ECHO 03/03/19 EF 50-55%   GLS -17.3% .    Past Medical History:  Diagnosis Date  . Anemia   . Blood transfusion 2005  . Family history of breast cancer   . Family history of prostate cancer   . Hernia   . HH (hiatus hernia)   . Hyperlipidemia   . Hypertension   . PAH (pulmonary artery hypertension) ([place])   . Personal history of chemotherapy   . Personal history of radiation therapy   . Sarcoidosis   . Shortness of breath    on exertion  . Sleep apnea    does not use cpap  . Wheezing     Current Outpatient Medications  Medication Sig Dispense Refill  . Ascorbic Acid (VITAMIN C) 1000 MG tablet Take 1,000 mg by mouth every other day.     .  Cholecalciferol (VITAMIN D) 2000 UNITS CAPS Take 2,000 Units by mouth daily.     . Coenzyme Q10 200 MG TABS Take 200 mg by mouth daily.    . hydrocortisone 2.5 % cream Apply 1 application topically 2 (two) times daily as needed (dry skin).    Marland Kitchen ibuprofen (ADVIL,MOTRIN) 200 MG tablet Take 400 mg by mouth every 6 (six) hours as needed for headache or moderate pain.    Marland Kitchen ketotifen (ALAWAY) 0.025 % ophthalmic solution Place 1 drop into both eyes 2 (two) times daily.    Marland Kitchen loperamide (IMODIUM A-D) 2 MG capsule Take 2 mg by mouth daily as needed for diarrhea or loose stools.    Marland Kitchen omeprazole (PRILOSEC) 20 MG capsule Take 20 mg by mouth daily.     . Polyvinyl Alcohol-Povidone PF (REFRESH) 1.4-0.6 % SOLN Place 1 drop into both eyes daily as needed (dry eyes).     . potassium gluconate 595 MG TABS Take 595 mg by mouth daily.     . simvastatin (ZOCOR) 20 MG tablet Take 20 mg by mouth every morning.     . traMADol (ULTRAM) 50 MG tablet Take 1 tablet (50 mg total) by mouth every 6 (six) hours as needed. 15 tablet 1  . triamterene-hydrochlorothiazide (MAXZIDE-25) 37.5-25 MG per tablet Take 1 tablet by mouth every morning.      No current facility-administered medications for this encounter.     Allergies  Allergen  Reactions  . Penicillins Rash    Fine red bumps that spread all over the body. Has patient had a PCN reaction causing immediate rash, facial/tongue/throat swelling, SOB or lightheadedness with hypotension: No Has patient had a PCN reaction causing severe rash involving mucus membranes or skin necrosis: No Has patient had a PCN reaction that required hospitalization: No Has patient had a PCN reaction occurring within the last 10 years: No If all of the above answers are "NO", then may proceed with Cephalosporin use.   . Skin Adhesives [Cyanoacrylate] Itching and Rash    Dermabond      Social History   Socioeconomic History  . Marital status: Single    Spouse name: Not on file  . Number  of children: Not on file  . Years of education: Not on file  . Highest education level: Not on file  Occupational History  . Occupation: Retired Tour manager  . Financial resource strain: Not on file  . Food insecurity    Worry: Not on file    Inability: Not on file  . Transportation needs    Medical: No    Non-medical: No  Tobacco Use  . Smoking status: Never Smoker  . Smokeless tobacco: Never Used  Substance and Sexual Activity  . Alcohol use: No  . Drug use: No  . Sexual activity: Not on file  Lifestyle  . Physical activity    Days per week: Not on file    Minutes per session: Not on file  . Stress: Not on file  Relationships  . Social Herbalist on phone: Not on file    Gets together: Not on file    Attends religious service: Not on file    Active member of club or organization: Not on file    Attends meetings of clubs or organizations: Not on file    Relationship status: Not on file  . Intimate partner violence    Fear of current or ex partner: No    Emotionally abused: No    Physically abused: No    Forced sexual activity: No  Other Topics Concern  . Not on file  Social History Narrative  . Not on file      Family History  Problem Relation Age of Onset  . Clotting disorder Mother        PE  . Heart disease Father   . Breast cancer Maternal Grandmother        spread to brain  . Coronary artery disease Other   . Prostate cancer Brother 71  . Breast cancer Maternal Aunt   . Cancer Maternal Aunt        unk type  . Prostate cancer Paternal Grandfather   . Prostate cancer Brother     Vitals:   06/08/19 1054  BP: 125/80  Pulse: 68  SpO2: 99%  Weight: 75.7 kg (166 lb 12.8 oz)    PHYSICAL EXAM: General:  Well appearing. No resp difficulty HEENT: normal Neck: supple. no JVD. Carotids 2+ bilat; no bruits. No lymphadenopathy or thryomegaly appreciated. Cor: PMI nondisplaced. Regular rate & rhythm. No rubs, gallops or murmurs. R  pora=t-a-cath Lungs: clear Abdomen: obese soft, nontender, nondistended. No hepatosplenomegaly. No bruits or masses. Good bowel sounds. Extremities: no cyanosis, clubbing, rash, edema Neuro: alert & orientedx3, cranial nerves grossly intact. moves all 4 extremities w/o difficulty. Affect pleasant   ASSESSMENT & PLAN: 1. Right Breast Cancer - diagnosed 11/19 ER/PR negative HER 2-neu + -  adjuvant chemotherapy consisting of paclitaxel x12 with trastuzumab given weekly started 07/27/2018 paclitaxel discontinued after 3 doses because of neuropathy  - trastuzumab to continue to total 1 year (through December 2020) - ECHO 1/20 EF 55-60% - ECHO 12/14/18 EF 50-55%   GLS -14% (underestimated) - ECHO 03/03/19 EF 50-55%   GLS -17.3% . - Echo today EF and GLS stable 50-55% GLS -17.4%  - Doubt significant toxicity. Will finish Herceptin next month. I will see back in 3-4 months to see one more time with post-Herceptin echo   2. HTN  - BP looks good today. Maybe even a bit on ow side. Follows with PCP.   3. OSA - Sleep study 11/13 (AHI 17.6) - Previously seen by Dr. Annamaria Boots  - Unable to tolerate CPAP - No s/s of OSA clinically   Rachel Bickers, MD  10:58 AM

## 2019-06-08 NOTE — Patient Instructions (Signed)
Your physician recommends that you schedule a follow-up appointment in: 4 months with echocardiogram  

## 2019-06-13 ENCOUNTER — Inpatient Hospital Stay: Payer: Medicare Other

## 2019-06-13 ENCOUNTER — Other Ambulatory Visit: Payer: Self-pay | Admitting: Oncology

## 2019-06-13 ENCOUNTER — Other Ambulatory Visit: Payer: Self-pay

## 2019-06-13 VITALS — BP 139/87 | HR 78 | Temp 98.2°F | Resp 16

## 2019-06-13 DIAGNOSIS — C50411 Malignant neoplasm of upper-outer quadrant of right female breast: Secondary | ICD-10-CM

## 2019-06-13 DIAGNOSIS — Z171 Estrogen receptor negative status [ER-]: Secondary | ICD-10-CM

## 2019-06-13 DIAGNOSIS — Z5112 Encounter for antineoplastic immunotherapy: Secondary | ICD-10-CM | POA: Diagnosis not present

## 2019-06-13 DIAGNOSIS — Z95828 Presence of other vascular implants and grafts: Secondary | ICD-10-CM

## 2019-06-13 LAB — CBC WITH DIFFERENTIAL/PLATELET
Abs Immature Granulocytes: 0 10*3/uL (ref 0.00–0.07)
Basophils Absolute: 0 10*3/uL (ref 0.0–0.1)
Basophils Relative: 0 %
Eosinophils Absolute: 0.1 10*3/uL (ref 0.0–0.5)
Eosinophils Relative: 2 %
HCT: 36.1 % (ref 36.0–46.0)
Hemoglobin: 11.9 g/dL — ABNORMAL LOW (ref 12.0–15.0)
Immature Granulocytes: 0 %
Lymphocytes Relative: 31 %
Lymphs Abs: 1.5 10*3/uL (ref 0.7–4.0)
MCH: 30.4 pg (ref 26.0–34.0)
MCHC: 33 g/dL (ref 30.0–36.0)
MCV: 92.1 fL (ref 80.0–100.0)
Monocytes Absolute: 0.4 10*3/uL (ref 0.1–1.0)
Monocytes Relative: 9 %
Neutro Abs: 2.8 10*3/uL (ref 1.7–7.7)
Neutrophils Relative %: 58 %
Platelets: 180 10*3/uL (ref 150–400)
RBC: 3.92 MIL/uL (ref 3.87–5.11)
RDW: 13.2 % (ref 11.5–15.5)
WBC: 4.8 10*3/uL (ref 4.0–10.5)
nRBC: 0 % (ref 0.0–0.2)

## 2019-06-13 LAB — COMPREHENSIVE METABOLIC PANEL
ALT: 17 U/L (ref 0–44)
AST: 23 U/L (ref 15–41)
Albumin: 3.7 g/dL (ref 3.5–5.0)
Alkaline Phosphatase: 80 U/L (ref 38–126)
Anion gap: 9 (ref 5–15)
BUN: 14 mg/dL (ref 8–23)
CO2: 25 mmol/L (ref 22–32)
Calcium: 8.8 mg/dL — ABNORMAL LOW (ref 8.9–10.3)
Chloride: 107 mmol/L (ref 98–111)
Creatinine, Ser: 1.12 mg/dL — ABNORMAL HIGH (ref 0.44–1.00)
GFR calc Af Amer: 56 mL/min — ABNORMAL LOW (ref >60)
GFR calc non Af Amer: 48 mL/min — ABNORMAL LOW (ref >60)
Glucose, Bld: 100 mg/dL — ABNORMAL HIGH (ref 70–99)
Potassium: 3.8 mmol/L (ref 3.5–5.1)
Sodium: 141 mmol/L (ref 135–145)
Total Bilirubin: 0.5 mg/dL (ref 0.3–1.2)
Total Protein: 7.2 g/dL (ref 6.5–8.1)

## 2019-06-13 MED ORDER — ACETAMINOPHEN 325 MG PO TABS
650.0000 mg | ORAL_TABLET | Freq: Once | ORAL | Status: AC
  Administered 2019-06-13: 650 mg via ORAL

## 2019-06-13 MED ORDER — SODIUM CHLORIDE 0.9 % IV SOLN
Freq: Once | INTRAVENOUS | Status: AC
  Administered 2019-06-13: 13:00:00 via INTRAVENOUS
  Filled 2019-06-13: qty 250

## 2019-06-13 MED ORDER — SODIUM CHLORIDE 0.9% FLUSH
10.0000 mL | Freq: Once | INTRAVENOUS | Status: AC
  Administered 2019-06-13: 10 mL
  Filled 2019-06-13: qty 10

## 2019-06-13 MED ORDER — DIPHENHYDRAMINE HCL 25 MG PO CAPS
ORAL_CAPSULE | ORAL | Status: AC
  Filled 2019-06-13: qty 1

## 2019-06-13 MED ORDER — TRASTUZUMAB-ANNS CHEMO 150 MG IV SOLR
450.0000 mg | Freq: Once | INTRAVENOUS | Status: AC
  Administered 2019-06-13: 450 mg via INTRAVENOUS
  Filled 2019-06-13: qty 21.43

## 2019-06-13 MED ORDER — ACETAMINOPHEN 325 MG PO TABS
ORAL_TABLET | ORAL | Status: AC
  Filled 2019-06-13: qty 2

## 2019-06-13 MED ORDER — HEPARIN SOD (PORK) LOCK FLUSH 100 UNIT/ML IV SOLN
500.0000 [IU] | Freq: Once | INTRAVENOUS | Status: AC | PRN
  Administered 2019-06-13: 500 [IU]
  Filled 2019-06-13: qty 5

## 2019-06-13 MED ORDER — DIPHENHYDRAMINE HCL 25 MG PO CAPS
25.0000 mg | ORAL_CAPSULE | Freq: Once | ORAL | Status: AC
  Administered 2019-06-13: 25 mg via ORAL

## 2019-06-13 MED ORDER — SODIUM CHLORIDE 0.9% FLUSH
10.0000 mL | INTRAVENOUS | Status: DC | PRN
  Administered 2019-06-13: 10 mL
  Filled 2019-06-13: qty 10

## 2019-06-13 NOTE — Telephone Encounter (Signed)
Revealed negative genetic testing. This normal result is reassuring and indicates that it is unlikely Rachel Kelly cancer is due to a hereditary cause.  It is unlikely that there is an increased risk of another cancer due to a mutation in one of these genes.  However, genetic testing is not perfect, and cannot definitively rule out a hereditary cause.  It will be important for her to keep in contact with genetics to learn if any additional testing may be needed in the future.

## 2019-06-13 NOTE — Progress Notes (Signed)
Staff message sent to [name] to add attestation of consent for trastuzumab.

## 2019-06-13 NOTE — Patient Instructions (Signed)
[facility] Discharge Instructions for Patients Receiving Chemotherapy  Today you received the following chemotherapy agent: Trastuzumab.  To help prevent nausea and vomiting after your treatment, we encourage you to take your nausea medication as directed.   If you develop nausea and vomiting that is not controlled by your nausea medication, call the clinic.   BELOW ARE SYMPTOMS THAT SHOULD BE REPORTED IMMEDIATELY:  *FEVER GREATER THAN 100.5 F  *CHILLS WITH OR WITHOUT FEVER  NAUSEA AND VOMITING THAT IS NOT CONTROLLED WITH YOUR NAUSEA MEDICATION  *UNUSUAL SHORTNESS OF BREATH  *UNUSUAL BRUISING OR BLEEDING  TENDERNESS IN MOUTH AND THROAT WITH OR WITHOUT PRESENCE OF ULCERS  *URINARY PROBLEMS  *BOWEL PROBLEMS  UNUSUAL RASH Items with * indicate a potential emergency and should be followed up as soon as possible.  Feel free to call the clinic should you have any questions or concerns. The clinic phone number is [phone].  Please show the [place] at check-in to the Emergency Department and triage nurse.

## 2019-06-14 ENCOUNTER — Encounter: Payer: Self-pay | Admitting: Licensed Clinical Social Worker

## 2019-06-14 ENCOUNTER — Ambulatory Visit: Payer: Self-pay | Admitting: Licensed Clinical Social Worker

## 2019-06-14 DIAGNOSIS — Z1379 Encounter for other screening for genetic and chromosomal anomalies: Secondary | ICD-10-CM

## 2019-06-14 DIAGNOSIS — Z171 Estrogen receptor negative status [ER-]: Secondary | ICD-10-CM

## 2019-06-14 DIAGNOSIS — Z8042 Family history of malignant neoplasm of prostate: Secondary | ICD-10-CM

## 2019-06-14 DIAGNOSIS — C50411 Malignant neoplasm of upper-outer quadrant of right female breast: Secondary | ICD-10-CM

## 2019-06-14 DIAGNOSIS — Z803 Family history of malignant neoplasm of breast: Secondary | ICD-10-CM

## 2019-06-14 NOTE — Progress Notes (Signed)
HPI:  Rachel Kelly was previously seen in the [facility] due to a personal and family history of cancer and concerns regarding a hereditary predisposition to cancer. Please refer to our prior cancer genetics clinic note for more information regarding our discussion, assessment and recommendations, at the time. Rachel Kelly recent genetic test results were disclosed to her, as were recommendations warranted by these results. These results and recommendations are discussed in more detail below.  CANCER HISTORY:  Oncology History  Malignant neoplasm of upper-outer quadrant of right breast in female, estrogen receptor negative ([place])  06/07/2018 Initial Diagnosis   Malignant neoplasm of upper-outer quadrant of right breast in female, estrogen receptor negative ([place])   07/27/2018 - 07/27/2018 Chemotherapy   The patient had DOXOrubicin (ADRIAMYCIN) chemo injection 106 mg, 60 mg/m2, Intravenous,  Once, 0 of 4 cycles PALONOSETRON HCL INJECTION 0.25 MG/5ML, 0.25 mg, Intravenous,  Once, 0 of 4 cycles pegfilgrastim (NEULASTA) injection 6 mg, 6 mg, Subcutaneous, Once, 0 of 4 cycles cyclophosphamide (CYTOXAN) 1,060 mg in sodium chloride 0.9 % 250 mL chemo infusion, 600 mg/m2, Intravenous,  Once, 0 of 4 cycles FOSAPREPITANT 150MG + DEXAMETHASONE INFUSION [facility], , Intravenous,  Once, 0 of 4 cycles  for chemotherapy treatment.    07/27/2018 - 08/09/2018 Chemotherapy   The patient had trastuzumab (HERCEPTIN) 315 mg in sodium chloride 0.9 % 250 mL chemo infusion, 4 mg/kg = 315 mg, Intravenous,  Once, 1 of 16 cycles Administration: 150 mg (08/02/2018), 150 mg (08/09/2018) PACLitaxel (TAXOL) 144 mg in sodium chloride 0.9 % 250 mL chemo infusion (</= 106m/m2), 80 mg/m2 = 144 mg, Intravenous,  Once, 1 of 3 cycles Administration: 144 mg (07/27/2018), 144 mg (08/02/2018), 144 mg (08/09/2018) trastuzumab-anns (KANJINTI) 315 mg in sodium chloride 0.9 % 250 mL chemo infusion, 4 mg/kg = 315 mg (100 % of original  dose 4 mg/kg), Intravenous,  Once, 1 of 1 cycle Dose modification: 4 mg/kg (original dose 4 mg/kg, Cycle 1, Reason: Other (see comments), Comment: insurance), 4 mg/kg (original dose 4 mg/kg, Cycle 1, Reason: Other (see comments)) Administration: 300 mg (07/27/2018)  for chemotherapy treatment.    08/30/2018 -  Chemotherapy   The patient had trastuzumab (HERCEPTIN) 300 mg in sodium chloride 0.9 % 250 mL chemo infusion, 294 mg (50 % of original dose 8 mg/kg), Intravenous,  Once, 10 of 10 cycles Dose modification: 6 mg/kg (original dose 8 mg/kg, Cycle 1, Reason: Provider Judgment), 4 mg/kg (original dose 8 mg/kg, Cycle 1, Reason: Provider Judgment) Administration: 300 mg (08/30/2018), 450 mg (09/13/2018), 450 mg (11/15/2018), 450 mg (10/04/2018), 450 mg (10/25/2018), 450 mg (12/06/2018), 450 mg (12/27/2018), 450 mg (01/17/2019), 450 mg (02/07/2019), 450 mg (02/28/2019) trastuzumab-anns (KANJINTI) 450 mg in sodium chloride 0.9 % 250 mL chemo infusion, 441 mg (100 % of original dose 6 mg/kg), Intravenous,  Once, 5 of 6 cycles Dose modification: 6 mg/kg (original dose 6 mg/kg, Cycle 11, Reason: Other (see comments)) Administration: 450 mg (03/21/2019), 450 mg (04/11/2019), 450 mg (05/02/2019), 450 mg (05/23/2019), 450 mg (06/13/2019)  for chemotherapy treatment.    09/01/2018 Cancer Staging   Staging form: Breast, AJCC 8th Edition - Pathologic: Stage IA (pT1b, pN0, cM0, G2, ER-, PR-, HER2+) - Signed by SEppie Gibson MD on 09/01/2018    Genetic Testing   Negative genetic testing. No pathogenic variants identified on the [facility] Common Hereditary Cancers Panel. The Common Hereditary Cancers Panel offered by [facility] includes sequencing and/or deletion duplication testing of the following 48 genes: APC, ATM, AXIN2, BARD1, BMPR1A,  BRCA1, BRCA2, BRIP1, CDH1, CDKN2A (p14ARF), CDKN2A (p16INK4a), CKD4, CHEK2, CTNNA1, DICER1, EPCAM (Deletion/duplication testing only), GREM1 (promoter region deletion/duplication testing only), KIT,  MEN1, MLH1, MSH2, MSH3, MSH6, MUTYH, NBN, NF1, NHTL1, PALB2, PDGFRA, PMS2, POLD1, POLE, PTEN, RAD50, RAD51C, RAD51D, RNF43, SDHB, SDHC, SDHD, SMAD4, SMARCA4. STK11, TP53, TSC1, TSC2, and VHL.  The following genes were evaluated for sequence changes only: SDHA and HOXB13 c.251G>A variant only. The report date is 06/01/2019.     FAMILY HISTORY:  We obtained a detailed, 4-generation family history.  Significant diagnoses are listed below: Family History  Problem Relation Age of Onset   Clotting disorder Mother        PE   Heart disease Father    Breast cancer Maternal Grandmother        spread to brain   Coronary artery disease Other    Prostate cancer Brother 57   Breast cancer Maternal Aunt    Cancer Maternal Aunt        unk type   Prostate cancer Paternal Grandfather    Prostate cancer Brother     Rachel Kelly has one daughter, Rachel Kelly, who is living at 95 with no history of cancer. Patient has 3 brothers. One of her brothers was recently diagnosed with prostate cancer at the beginning of 2020, he is 59. Another brother has also had prostate cancer.   Rachel Kelly mother is living at 4. Patient had 3 maternal aunts, 5 maternal uncles. One of her aunts had breast cancer, unsure age of diagnosis. She also had another cancer but is unsure the type. No known cancers in maternal cousins. Maternal grandmother had breast cancer, unsure age of diagnosis, died at 59. Her maternal grandfather died of a stroke.   Rachel Kelly father died of heart issues at 36. Patient had 1 paternal aunt, no cancers. No cancers for her 1 paternal cousin. Her paternal grandfather did have prostate cancer, unsure age of diagnosis. She was unsure when her paternal grandmother passed.  Rachel Kelly is unaware of previous family history of genetic testing for hereditary cancer risks. There is no reported Ashkenazi Jewish ancestry. There is no known consanguinity.  GENETIC TEST RESULTS: Genetic testing  reported out on 06/01/2019 through the Common Hereditary cancer panel found no pathogenic mutations.   The Common Hereditary Cancers Panel offered by [facility] includes sequencing and/or deletion duplication testing of the following 48 genes: APC, ATM, AXIN2, BARD1, BMPR1A, BRCA1, BRCA2, BRIP1, CDH1, CDKN2A (p14ARF), CDKN2A (p16INK4a), CKD4, CHEK2, CTNNA1, DICER1, EPCAM (Deletion/duplication testing only), GREM1 (promoter region deletion/duplication testing only), KIT, MEN1, MLH1, MSH2, MSH3, MSH6, MUTYH, NBN, NF1, NHTL1, PALB2, PDGFRA, PMS2, POLD1, POLE, PTEN, RAD50, RAD51C, RAD51D, RNF43, SDHB, SDHC, SDHD, SMAD4, SMARCA4. STK11, TP53, TSC1, TSC2, and VHL.  The following genes were evaluated for sequence changes only: SDHA and HOXB13 c.251G>A variant only.  The test report has been scanned into EPIC and is located under the Molecular Pathology section of the Results Review tab.  A portion of the result report is included below for reference.      We discussed with Rachel Kelly that because current genetic testing is not perfect, it is possible there may be a Kelly mutation in one of these genes that current testing cannot detect, but that chance is small.  We also discussed, that there could be another Kelly that has not yet been discovered, or that we have not yet tested, that is responsible for the cancer diagnoses in the family. It is also possible there is a  hereditary cause for the cancer in the family that Rachel Kelly did not inherit and therefore was not identified in her testing.  Therefore, it is important to remain in touch with cancer genetics in the future so that we can continue to offer Rachel Kelly the most up to date genetic testing.   ADDITIONAL GENETIC TESTING: We discussed with Rachel Kelly that her genetic testing was fairly extensive.  If there are genes identified to increase cancer risk that can be analyzed in the future, we would be happy to discuss and coordinate this testing at that  time.    CANCER SCREENING RECOMMENDATIONS: Rachel Kelly test result is considered negative (normal).  This means that we have not identified a hereditary cause for her personal and family history of cancer at this time. Most cancers happen by chance and this negative test suggests that her cancer may fall into this category.    While reassuring, this does not definitively rule out a hereditary predisposition to cancer. It is still possible that there could be genetic mutations that are undetectable by current technology. There could be genetic mutations in genes that have not been tested or identified to increase cancer risk.  Therefore, it is recommended she continue to follow the cancer management and screening guidelines provided by her oncology and primary healthcare provider.   An individual's cancer risk and medical management are not determined by genetic test results alone. Overall cancer risk assessment incorporates additional factors, including personal medical history, family history, and any available genetic information that may result in a personalized plan for cancer prevention and surveillanc  RECOMMENDATIONS FOR FAMILY MEMBERS:  Relatives in this family might be at some increased risk of developing cancer, over the general population risk, simply due to the family history of cancer.  We recommended female relatives in this family have a yearly mammogram beginning at age 26, or 48 years younger than the earliest onset of cancer, an annual clinical breast exam, and perform monthly breast self-exams. Female relatives in this family should also have a gynecological exam as recommended by their primary provider. All family members should have a colonoscopy by age 64, or as directed by their physicians.   It is also possible there is a hereditary cause for the cancer in Rachel Kelly family that she did not inherit and therefore was not identified in her.  Based on Rachel Kelly family history,  we recommended her brothers/ maternal relatives have genetic counseling and testing. Rachel Kelly will let us know if we can be of any assistance in coordinating genetic counseling and/or testing for these family members.  FOLLOW-UP: Lastly, we discussed with Rachel Kelly that cancer genetics is a rapidly advancing field and it is possible that new genetic tests will be appropriate for her and/or her family members in the future. We encouraged her to remain in contact with cancer genetics on an annual basis so we can update her personal and family histories and let her know of advances in cancer genetics that may benefit this family.   Our contact number was provided. Rachel Kelly. [place] questions were answered to her satisfaction, and she knows she is welcome to call us at anytime with additional questions or concerns.   Faith Rogue, Rachel Kelly, [facility] Genetic Counselor [email or web address] Phone: [phone]

## 2019-06-27 ENCOUNTER — Ambulatory Visit
Admission: RE | Admit: 2019-06-27 | Discharge: 2019-06-27 | Disposition: A | Discharge status: Home / Self Care | Payer: Medicare Other | Source: Ambulatory Visit | Attending: Orthopaedic Surgery | Admitting: Orthopaedic Surgery

## 2019-06-27 ENCOUNTER — Other Ambulatory Visit: Payer: Self-pay

## 2019-06-27 DIAGNOSIS — M4326 Fusion of spine, lumbar region: Secondary | ICD-10-CM

## 2019-07-04 ENCOUNTER — Ambulatory Visit: Payer: Medicare Other | Admitting: Adult Health

## 2019-07-04 ENCOUNTER — Inpatient Hospital Stay: Payer: Medicare Other | Admitting: Oncology

## 2019-07-04 ENCOUNTER — Inpatient Hospital Stay: Payer: Medicare Other

## 2019-07-04 ENCOUNTER — Other Ambulatory Visit: Payer: Medicare Other

## 2019-07-04 ENCOUNTER — Other Ambulatory Visit: Payer: Self-pay

## 2019-07-04 ENCOUNTER — Encounter: Payer: Self-pay | Admitting: *Deleted

## 2019-07-04 ENCOUNTER — Inpatient Hospital Stay: Payer: Medicare Other | Attending: Oncology

## 2019-07-04 ENCOUNTER — Ambulatory Visit: Payer: Medicare Other

## 2019-07-04 VITALS — BP 113/78 | HR 74 | Temp 97.8°F | Resp 18 | Ht 59.0 in | Wt 166.9 lb

## 2019-07-04 DIAGNOSIS — Z171 Estrogen receptor negative status [ER-]: Secondary | ICD-10-CM

## 2019-07-04 DIAGNOSIS — C50411 Malignant neoplasm of upper-outer quadrant of right female breast: Secondary | ICD-10-CM

## 2019-07-04 DIAGNOSIS — Z79899 Other long term (current) drug therapy: Secondary | ICD-10-CM | POA: Insufficient documentation

## 2019-07-04 DIAGNOSIS — Z95828 Presence of other vascular implants and grafts: Secondary | ICD-10-CM

## 2019-07-04 DIAGNOSIS — D869 Sarcoidosis, unspecified: Secondary | ICD-10-CM

## 2019-07-04 DIAGNOSIS — Z5112 Encounter for antineoplastic immunotherapy: Secondary | ICD-10-CM | POA: Insufficient documentation

## 2019-07-04 LAB — CBC WITH DIFFERENTIAL/PLATELET
Abs Immature Granulocytes: 0.01 10*3/uL (ref 0.00–0.07)
Basophils Absolute: 0 10*3/uL (ref 0.0–0.1)
Basophils Relative: 0 %
Eosinophils Absolute: 0.1 10*3/uL (ref 0.0–0.5)
Eosinophils Relative: 2 %
HCT: 35.5 % — ABNORMAL LOW (ref 36.0–46.0)
Hemoglobin: 11.8 g/dL — ABNORMAL LOW (ref 12.0–15.0)
Immature Granulocytes: 0 %
Lymphocytes Relative: 29 %
Lymphs Abs: 1.3 10*3/uL (ref 0.7–4.0)
MCH: 30.7 pg (ref 26.0–34.0)
MCHC: 33.2 g/dL (ref 30.0–36.0)
MCV: 92.4 fL (ref 80.0–100.0)
Monocytes Absolute: 0.4 10*3/uL (ref 0.1–1.0)
Monocytes Relative: 9 %
Neutro Abs: 2.8 10*3/uL (ref 1.7–7.7)
Neutrophils Relative %: 60 %
Platelets: 172 10*3/uL (ref 150–400)
RBC: 3.84 MIL/uL — ABNORMAL LOW (ref 3.87–5.11)
RDW: 13 % (ref 11.5–15.5)
WBC: 4.6 10*3/uL (ref 4.0–10.5)
nRBC: 0 % (ref 0.0–0.2)

## 2019-07-04 LAB — COMPREHENSIVE METABOLIC PANEL
ALT: 18 U/L (ref 0–44)
AST: 27 U/L (ref 15–41)
Albumin: 3.7 g/dL (ref 3.5–5.0)
Alkaline Phosphatase: 79 U/L (ref 38–126)
Anion gap: 8 (ref 5–15)
BUN: 21 mg/dL (ref 8–23)
CO2: 25 mmol/L (ref 22–32)
Calcium: 8.8 mg/dL — ABNORMAL LOW (ref 8.9–10.3)
Chloride: 106 mmol/L (ref 98–111)
Creatinine, Ser: 1.24 mg/dL — ABNORMAL HIGH (ref 0.44–1.00)
GFR calc Af Amer: 50 mL/min — ABNORMAL LOW (ref >60)
GFR calc non Af Amer: 43 mL/min — ABNORMAL LOW (ref >60)
Glucose, Bld: 110 mg/dL — ABNORMAL HIGH (ref 70–99)
Potassium: 3.6 mmol/L (ref 3.5–5.1)
Sodium: 139 mmol/L (ref 135–145)
Total Bilirubin: 0.4 mg/dL (ref 0.3–1.2)
Total Protein: 6.9 g/dL (ref 6.5–8.1)

## 2019-07-04 MED ORDER — TRASTUZUMAB-ANNS CHEMO 150 MG IV SOLR
450.0000 mg | Freq: Once | INTRAVENOUS | Status: AC
  Administered 2019-07-04: 450 mg via INTRAVENOUS
  Filled 2019-07-04: qty 21.43

## 2019-07-04 MED ORDER — ACETAMINOPHEN 325 MG PO TABS
ORAL_TABLET | ORAL | Status: AC
  Filled 2019-07-04: qty 2

## 2019-07-04 MED ORDER — HEPARIN SOD (PORK) LOCK FLUSH 100 UNIT/ML IV SOLN
500.0000 [IU] | Freq: Once | INTRAVENOUS | Status: AC | PRN
  Administered 2019-07-04: 500 [IU]
  Filled 2019-07-04: qty 5

## 2019-07-04 MED ORDER — SODIUM CHLORIDE 0.9% FLUSH
10.0000 mL | INTRAVENOUS | Status: DC | PRN
  Administered 2019-07-04: 10 mL
  Filled 2019-07-04: qty 10

## 2019-07-04 MED ORDER — SODIUM CHLORIDE 0.9 % IV SOLN
Freq: Once | INTRAVENOUS | Status: AC
  Administered 2019-07-04: 14:00:00 via INTRAVENOUS
  Filled 2019-07-04: qty 250

## 2019-07-04 MED ORDER — SODIUM CHLORIDE 0.9% FLUSH
10.0000 mL | Freq: Once | INTRAVENOUS | Status: AC
  Administered 2019-07-04: 10 mL
  Filled 2019-07-04: qty 10

## 2019-07-04 MED ORDER — ACETAMINOPHEN 325 MG PO TABS
650.0000 mg | ORAL_TABLET | Freq: Once | ORAL | Status: AC
  Administered 2019-07-04: 650 mg via ORAL

## 2019-07-04 MED ORDER — DIPHENHYDRAMINE HCL 25 MG PO CAPS
ORAL_CAPSULE | ORAL | Status: AC
  Filled 2019-07-04: qty 1

## 2019-07-04 MED ORDER — DIPHENHYDRAMINE HCL 25 MG PO CAPS
25.0000 mg | ORAL_CAPSULE | Freq: Once | ORAL | Status: AC
  Administered 2019-07-04: 25 mg via ORAL

## 2019-07-04 NOTE — Patient Instructions (Signed)

## 2019-07-04 NOTE — Progress Notes (Signed)
[facility]  [phone]) [phone] Fax:(336) [phone]     ID: Rachel Kelly DOB: [birth year]  MR#: [id number]  CLE#:[id number]  Patient Care Team: [name], MD as PCP - General (Family Medicine) Deneise Lever, MD as Referring Physician (Pulmonary Disease) [name], MD (Inactive) as Consulting Physician (General Surgery) Kaivon Livesey, [name], MD as Consulting Physician (Oncology) Eppie Gibson, MD as Attending Physician (Radiation Oncology) Chelsea Aus, DDS as Consulting Physician (Dentistry) Harriett Sine, MD as Consulting Physician (Dermatology) Bensimhon, Shaune Pascal, MD as Consulting Physician (Cardiology) Mauro Kaufmann, RN as Oncology Nurse Navigator Rockwell Germany, RN as Oncology Nurse Navigator Starling Manns, MD (Orthopedic Surgery) OTHER MD:  CHIEF COMPLAINT: HER-2 positive, estrogen receptor negative breast cancer  CURRENT TREATMENT: Completing anti-HER-2 immunotherapy   INTERVAL HISTORY: Rachel Kelly returns today for follow-up and treatment of her HER-2 positive breast cancer. She was last seen here on 02/07/2019.   She continues on trastuzumab every 3 weeks with her final dose today.  She has not had any symptoms from this that she is aware of. Since her last visit, she underwent repeat echocardiogram on 06/08/2019, which showed an ejection fraction of 50-55%.   She also underwent genetic testing on 05/23/2019. Results showed no pathogenic mutations.  Rachel Kelly underwent bilateral diagnostic mammography with tomography at [facility] on 05/30/2019 showing: breast density category B; no evidence of malignancy in either breast.  She also underwent lumbar spine MRI on 06/27/2019 for low back pain radiating into her right lower extremity. This showed: L4-5 PLIF with widely patent spinal canal; severe bilateral L3-4 neural foraminal stenosis and mild spinal canal stenosis; moderate right L5-S1 neural foraminal stenosis.  Coronavirus testing on  05/26/2019, which was negative.   REVIEW OF SYSTEMS: Rachel Kelly has had a lot of back problems which are being evaluated by Dr. Greig Castilla.  There is no evidence of cancer there thankfully.  She completes her radiation today and she wants her port removed.  For Thanksgiving's her daughter came over and brought some food which she greatly appreciated.  She has been feeling dizzy very intermittently, possibly related to her use of Maxide.  She has been trying very hard to drink 8 glasses of water.  She has never made it beyond 7.  Even with 6 or 7 she feels she is going to the bathroom constantly.  Aside from these issues a detailed review of systems today was stable   HISTORY OF CURRENT ILLNESS: From the original intake note:  Rachel Kelly had routine screening mammography on 05/26/2018 showing a possible abnormality in the right breast. She underwent bilateral diagnostic mammography with tomography and right breast ultrasonography at [facility] on 06/02/2018 showing: breast density category C, suspicious mass at the 10 o'clock position 8 cm from the nipple. It measures 10 x 7 x 6 mm. Just adjacent to this mass, there is an irregular hypoechoic mass with punctate echogenic foci. It measures 9 x 8 x 3 mm. Additional indeterminate mass at the 9 o'clock position 6 cm from the nipple. It measures 6 x 4 x 4 mm. No suspicious right axillary lymphadenopathy.  Accordingly on 06/02/2018 she proceeded to biopsy of two of the right breast areas in question. The pathology from this procedure showed [phone]): at the 10 o'clock position, invasive ductal carcinoma, grade 2, estrogen and progesterone receptor negative, HER-2 equivocal by immunohistochemistry at 2+, but negative by [facility] with a signals ratio of 1.47 and number per cell 2.20.  Biopsy of the  right breast lesion at the 9 o'clock position showed a sclerotic lesion with calcifications and negative for carcinoma.  The patient's subsequent history is as  detailed below.   PAST MEDICAL HISTORY: Past Medical History:  Diagnosis Date  . Anemia   . Blood transfusion 2005  . Family history of breast cancer   . Family history of prostate cancer   . Hernia   . HH (hiatus hernia)   . Hyperlipidemia   . Hypertension   . PAH (pulmonary artery hypertension) ([place])   . Personal history of chemotherapy   . Personal history of radiation therapy   . Sarcoidosis   . Shortness of breath    on exertion  . Sleep apnea    does not use cpap  . Wheezing     PAST SURGICAL HISTORY: Past Surgical History:  Procedure Laterality Date  . ABDOMINAL HYSTERECTOMY  1988  . BACK SURGERY  2005   lower  . BREAST LUMPECTOMY WITH RADIOACTIVE SEED AND SENTINEL LYMPH NODE BIOPSY Right 07/02/2018   Procedure: RIGHT BREAST RADIOACTIVE SEED LUMPECTOMY X2 AND RIGHT AXILLARY  SENTINEL LYMPH NODE BIOPSY;  Surgeon: [name], MD;  Location: [facility];  Service: General;  Laterality: Right;  . ESOPHAGOGASTRODUODENOSCOPY  06/24/2012   Procedure: ESOPHAGOGASTRODUODENOSCOPY (EGD);  Surgeon: [name], MD;  Location: [name];  Service: General;  Laterality: N/A;  . HIATAL HERNIA REPAIR  08/18/2012   Procedure: LAPAROSCOPIC REPAIR OF HIATAL HERNIA;  Surgeon: [name], MD;  Location: [facility];  Service: General;  Laterality: N/A;  Laparoscopic Hiatal Hernia Repair   . INSERTION OF MESH  08/18/2012   Procedure: INSERTION OF MESH;  Surgeon: [name], MD;  Location: [facility];  Service: General;;  . IR IMAGING GUIDED PORT INSERTION  07/19/2018  . PORTACATH PLACEMENT N/A 07/02/2018   Procedure: INSERTION PORT-A-CATH TRIED NO PORT IN PLACE;  Surgeon: [name], MD;  Location: [facility];  Service: General;  Laterality: N/A;  . VESICOVAGINAL FISTULA CLOSURE W/ TAH      FAMILY HISTORY Family History  Problem Relation Age of Onset  . Clotting disorder Mother        PE  . Heart disease Father   . Breast cancer Maternal Grandmother        spread  to brain  . Coronary artery disease Other   . Prostate cancer Brother 59  . Breast cancer Maternal Aunt   . Cancer Maternal Aunt        unk type  . Prostate cancer Paternal Grandfather   . Prostate cancer Brother    She notes that her father died from heart disease at age 17. Patients' mother lives in a nursing home and is 63 (as of November 2019)-- she has a form of dementia. The patient has 3 brothers and 0 sisters. A maternal aunt had a form of cancer, not clear what it was.  The maternal grandmother had breast cancer, but was never treated because of her old age.  The patient does not think anyone in her family had ovarian cancer, but there are 2 others in her family that had colon cancer.    GYNECOLOGIC HISTORY:  No LMP recorded. Patient has had a hysterectomy. 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  She lives alone. She does not have any pets.  Her daughter Deatra Canter "Cantrece" Grandville Silos, 48, lives in [place] and works in Psychologist, educational. No grandchildren.  The patient belongs to [place]: In place, but she can't remember who her power of attorney is (believes it is her daughter).   HEALTH MAINTENANCE: Social History   Tobacco Use  . Smoking status: Never Smoker  . Smokeless tobacco: Never Used  Substance Use Topics  . Alcohol use: No  . Drug use: No    Colonoscopy: Dr. Collene Mares  PAP: yes, but not sure when  Bone density: No   Allergies  Allergen Reactions  . Penicillins Rash    Fine red bumps that spread all over the body. Has patient had a PCN reaction causing immediate rash, facial/tongue/throat swelling, SOB or lightheadedness with hypotension: No Has patient had a PCN reaction causing  severe rash involving mucus membranes or skin necrosis: No Has patient had a PCN reaction that required hospitalization: No Has patient had a PCN reaction occurring within the last 10 years: No If all of the above answers are "NO", then may proceed with Cephalosporin use.   . Skin Adhesives [Cyanoacrylate] Itching and Rash    Dermabond    Current Outpatient Medications  Medication Sig Dispense Refill  . Ascorbic Acid (VITAMIN C) 1000 MG tablet Take 1,000 mg by mouth every other day.     . Cholecalciferol (VITAMIN D) 2000 UNITS CAPS Take 2,000 Units by mouth daily.     . Coenzyme Q10 200 MG TABS Take 200 mg by mouth daily.    . hydrocortisone 2.5 % cream Apply 1 application topically 2 (two) times daily as needed (dry skin).    Rachel Kelly Kitchen ibuprofen (ADVIL,MOTRIN) 200 MG tablet Take 400 mg by mouth every 6 (six) hours as needed for headache or moderate pain.    Rachel Kelly Kitchen ketotifen (ALAWAY) 0.025 % ophthalmic solution Place 1 drop into both eyes 2 (two) times daily.    Rachel Kelly Kitchen loperamide (IMODIUM A-D) 2 MG capsule Take 2 mg by mouth daily as needed for diarrhea or loose stools.    Rachel Kelly Kitchen omeprazole (PRILOSEC) 20 MG capsule Take 20 mg by mouth daily.     . Polyvinyl Alcohol-Povidone PF (REFRESH) 1.4-0.6 % SOLN Place 1 drop into both eyes daily as needed (dry eyes).     . potassium gluconate 595 MG TABS Take 595 mg by mouth daily.     . simvastatin (ZOCOR) 20 MG tablet Take 20 mg by mouth every morning.     . traMADol (ULTRAM) 50 MG tablet Take 1 tablet (50 mg total) by mouth every 6 (six) hours as needed. 15 tablet 1  . triamterene-hydrochlorothiazide (MAXZIDE-25) 37.5-25 MG per tablet Take 1 tablet by mouth every morning.      No current facility-administered medications for this visit.    OBJECTIVE: Older African-American woman who appears stated age  32:   07/04/19 1310  BP: 113/78  Pulse: 74  Resp: 18  Temp: 97.8 F (36.6 C)  SpO2: 99%   Wt Readings from Last 3 Encounters:  07/04/19 166 lb 14.4 oz  (75.7 kg)  06/08/19 166 lb 12.8 oz (75.7 kg)  05/02/19 165 lb 9.6 oz (75.1 kg)   Body mass index is 33.71 kg/m.    ECOG FS:1 - Symptomatic but completely ambulatory  Sclerae unicteric, EOMs intact Wearing a mask No cervical or supraclavicular adenopathy Lungs no rales or rhonchi  Heart regular rate and rhythm Abd soft, nontender, positive bowel sounds MSK no focal spinal tenderness, no upper extremity lymphedema Neuro: nonfocal, well oriented, appropriate affect Breasts: The right breast is status post lumpectomy and radiation.  There are areas of scarring as previously noted.  The overall cosmetic result is excellent however.  There is minimal residual hyperpigmentation.  The left breast is benign.  Both axillae are benign.   LAB RESULTS:  CMP     Component Value Date/Time   NA 139 07/04/2019 1207   K 3.6 07/04/2019 1207   CL 106 07/04/2019 1207   CO2 25 07/04/2019 1207   GLUCOSE 110 (H) 07/04/2019 1207   BUN 21 07/04/2019 1207   CREATININE 1.24 (H) 07/04/2019 1207   CREATININE 1.18 (H) 01/17/2019 1305   CALCIUM 8.8 (L) 07/04/2019 1207   PROT 6.9 07/04/2019 1207   ALBUMIN 3.7 07/04/2019 1207   AST 27 07/04/2019 1207   AST 24 01/17/2019 1305   ALT 18 07/04/2019 1207   ALT 17 01/17/2019 1305   ALKPHOS 79 07/04/2019 1207   BILITOT 0.4 07/04/2019 1207   BILITOT 0.3 01/17/2019 1305   GFRNONAA 43 (L) 07/04/2019 1207   GFRNONAA 45 (L) 01/17/2019 1305   GFRAA 50 (L) 07/04/2019 1207   GFRAA 53 (L) 01/17/2019 1305    No results found for: TOTALPROTELP, ALBUMINELP, A1GS, A2GS, BETS, BETA2SER, GAMS, MSPIKE, SPEI  No results found for: KPAFRELGTCHN, LAMBDASER, KAPLAMBRATIO  Lab Results  Component Value Date   WBC 4.6 07/04/2019   NEUTROABS 2.8 07/04/2019   HGB 11.8 (L) 07/04/2019   HCT 35.5 (L) 07/04/2019   MCV 92.4 07/04/2019   PLT 172 07/04/2019    _0 @  No results found for: LABCA2  No components found for: [id number]  No results for input(s): INR  in the last 168 hours.  No results found for: LABCA2  No results found for: QMG867  No results found for: YPP509  No results found for: TOI712  No results found for: CA2729  No components found for: HGQUANT  No results found for: CEA1 / No results found for: CEA1   No results found for: AFPTUMOR  No results found for: CHROMOGRNA  No results found for: PSA1  Appointment on 07/04/2019  Component Date Value Ref Range Status  . Sodium 07/04/2019 139  135 - 145 mmol/L Final  . Potassium 07/04/2019 3.6  3.5 - 5.1 mmol/L Final  . Chloride 07/04/2019 106  98 - 111 mmol/L Final  . CO2 07/04/2019 25  22 - 32 mmol/L Final  . Glucose, Bld 07/04/2019 110* 70 - 99 mg/dL Final  . BUN 07/04/2019 21  8 - 23 mg/dL Final  . Creatinine, Ser 07/04/2019 1.24* 0.44 - 1.00 mg/dL Final  . Calcium 07/04/2019 8.8* 8.9 - 10.3 mg/dL Final  . Total Protein 07/04/2019 6.9  6.5 - 8.1 g/dL Final  . Albumin 07/04/2019 3.7  3.5 - 5.0 g/dL Final  . AST 07/04/2019 27  15 - 41 U/L Final  . ALT 07/04/2019 18  0 - 44 U/L Final  . Alkaline Phosphatase 07/04/2019 79  38 - 126 U/L Final  . Total Bilirubin 07/04/2019 0.4  0.3 - 1.2 mg/dL Final  . GFR calc non Af Amer 07/04/2019 43* >60 mL/min Final  . GFR calc Af Amer 07/04/2019 50* >60 mL/min Final  . Anion gap 07/04/2019 8  5 - 15 Final   Performed at [facility], [place] [address], [place], [place] [address]  .  WBC 07/04/2019 4.6  4.0 - 10.5 K/uL Final  . RBC 07/04/2019 3.84* 3.87 - 5.11 MIL/uL Final  . Hemoglobin 07/04/2019 11.8* 12.0 - 15.0 g/dL Final  . HCT 07/04/2019 35.5* 36.0 - 46.0 % Final  . MCV 07/04/2019 92.4  80.0 - 100.0 fL Final  . MCH 07/04/2019 30.7  26.0 - 34.0 pg Final  . MCHC 07/04/2019 33.2  30.0 - 36.0 g/dL Final  . RDW 07/04/2019 13.0  11.5 - 15.5 % Final  . Platelets 07/04/2019 172  150 - 400 K/uL Final  . nRBC 07/04/2019 0.0  0.0 - 0.2 % Final  . Neutrophils Relative % 07/04/2019 60  % Final  . Neutro Abs  07/04/2019 2.8  1.7 - 7.7 K/uL Final  . Lymphocytes Relative 07/04/2019 29  % Final  . Lymphs Abs 07/04/2019 1.3  0.7 - 4.0 K/uL Final  . Monocytes Relative 07/04/2019 9  % Final  . Monocytes Absolute 07/04/2019 0.4  0.1 - 1.0 K/uL Final  . Eosinophils Relative 07/04/2019 2  % Final  . Eosinophils Absolute 07/04/2019 0.1  0.0 - 0.5 K/uL Final  . Basophils Relative 07/04/2019 0  % Final  . Basophils Absolute 07/04/2019 0.0  0.0 - 0.1 K/uL Final  . Immature Granulocytes 07/04/2019 0  % Final  . Abs Immature Granulocytes 07/04/2019 0.01  0.00 - 0.07 K/uL Final   Performed at [facility], [place] [address] [place], [place] [address]    (this displays the last labs from the last 3 days)  No results found for: TOTALPROTELP, ALBUMINELP, A1GS, A2GS, BETS, BETA2SER, GAMS, MSPIKE, SPEI (this displays SPEP labs)  No results found for: KPAFRELGTCHN, LAMBDASER, KAPLAMBRATIO (kappa/lambda light chains)  No results found for: HGBA, HGBA2QUANT, HGBFQUANT, HGBSQUAN (Hemoglobinopathy evaluation)   No results found for: LDH  No results found for: IRON, TIBC, IRONPCTSAT (Iron and TIBC)  No results found for: FERRITIN  Urinalysis    Component Value Date/Time   COLORURINE STRAW (A) 07/27/2018 1050   APPEARANCEUR CLEAR 07/27/2018 1050   LABSPEC 1.011 07/27/2018 1050   PHURINE 6.0 07/27/2018 1050   GLUCOSEU NEGATIVE 07/27/2018 1050   HGBUR SMALL (A) 07/27/2018 1050   BILIRUBINUR NEGATIVE 07/27/2018 1050   KETONESUR NEGATIVE 07/27/2018 1050   PROTEINUR NEGATIVE 07/27/2018 1050   UROBILINOGEN 1.0 11/21/2012 1402   NITRITE NEGATIVE 07/27/2018 1050   LEUKOCYTESUR MODERATE (A) 07/27/2018 1050     STUDIES:  MR LUMBAR SPINE WO CONTRAST  Result Date: 06/27/2019 CLINICAL DATA:  Low back pain radiating into the right lower extremity EXAM: MRI LUMBAR SPINE WITHOUT CONTRAST TECHNIQUE: Multiplanar, multisequence MR imaging of the lumbar spine was performed. No intravenous  contrast was administered. COMPARISON:  None. FINDINGS: Segmentation:  Normal Alignment:  Grade 1 retrolisthesis at L2-3 and L3-4. Vertebrae: L4-5 PLIF. No compression fracture or discitis-osteomyelitis. Conus medullaris and cauda equina: Conus extends to the L1 level. Conus and cauda equina appear normal. Paraspinal and other soft tissues: Negative Disc levels: Sagittal imaging of T10-11 is unremarkable. T11-12: Small disc bulge without spinal canal or neural foraminal stenosis. T12-L1: Normal disc space and facets. No spinal canal or neuroforaminal stenosis. L1-L2: Normal disc space and facets. No spinal canal or neuroforaminal stenosis. L2-L3: Left asymmetric disc bulge with narrowing of the left lateral recess. Mild bilateral foraminal stenosis. No spinal canal stenosis. L3-L4: Intermediate disc bulge with endplate spurring and right-greater-than-left facet hypertrophy. Severe bilateral neural foraminal stenosis. Mild spinal canal stenosis. L4-L5: Posterior decompression and PLIF. Widely patent spinal canal. No  neural impingement. L5-S1: Disc desiccation with right asymmetric bulge. Moderate right foraminal stenosis. No spinal canal stenosis. Visualized sacrum: Normal. IMPRESSION: 1. L4-5 PLIF with widely patent spinal canal. 2. Severe bilateral L3-4 neural foraminal stenosis and mild spinal canal stenosis. 3. Moderate right L5-S1 neural foraminal stenosis. Electronically Signed   By: Ulyses Jarred M.D.   On: 06/27/2019 21:56   ECHOCARDIOGRAM COMPLETE  Result Date: 06/08/2019   ECHOCARDIOGRAM REPORT   Patient Name:   TERIANNE THAKER Date of Exam: 06/08/2019 Medical Rec #:  [id number]      Height:       59.0 in Accession #:    [id number]     Weight:       165.6 lb Date of Birth:  [birth year]      BSA:          1.70 m Patient Age:    8 years       BP:           125/84 mmHg Patient Gender: F              HR:           70 bpm. Exam Location:  Outpatient Procedure: 2D Echo, 3D Echo, Color Doppler, Cardiac Doppler  and Strain Analysis Indications:    Chemo Evaluation,; I50.9* Heart failure (unspecified)  History:        Patient has prior history of Echocardiogram examinations, most                 recent 03/03/2019. CHF, Pulmonary HTN; Risk Factors:Hypertension,                 Dyslipidemia and Sleep Apnea. Breast Cancer, sarcoidosis.  Sonographer:    Raquel Sarna Senior RDCS Referring Phys: [id number] DANIEL R BENSIMHON IMPRESSIONS  1. Left ventricular ejection fraction, by visual estimation, is 50 to 55%. The left ventricle has low normal function. There is no left ventricular hypertrophy.  2. Left ventricular diastolic parameters are consistent with Grade I diastolic dysfunction (impaired relaxation).  3. Global right ventricle has hyperdynamic systolic function.The right ventricular size is normal. No increase in right ventricular wall thickness.  4. Left atrial size was mildly dilated.  5. Right atrial size was normal.  6. The mitral valve is normal in structure. Trace mitral valve regurgitation.  7. The tricuspid valve is normal in structure. Tricuspid valve regurgitation is mild.  8. The aortic valve is normal in structure. Aortic valve regurgitation is not visualized. Mild aortic valve sclerosis without stenosis.  9. The pulmonic valve was grossly normal. Pulmonic valve regurgitation is not visualized. 10. Mildly elevated pulmonary artery systolic pressure. 11. The inferior vena cava is normal in size with greater than 50% respiratory variability, suggesting right atrial pressure of 3 mmHg. 12. The average left ventricular global longitudinal strain is -17.4 %. FINDINGS  Left Ventricle: Left ventricular ejection fraction, by visual estimation, is 50 to 55%. The left ventricle has low normal function. The average left ventricular global longitudinal strain is -17.4 %. There is no left ventricular hypertrophy. Left ventricular diastolic parameters are consistent with Grade I diastolic dysfunction (impaired relaxation). Right  Ventricle: The right ventricular size is normal. No increase in right ventricular wall thickness. Global RV systolic function is has hyperdynamic systolic function. The tricuspid regurgitant velocity is 2.61 m/s, and with an assumed right atrial pressure of 3 mmHg, the estimated right ventricular systolic pressure is mildly elevated at 30.2 mmHg. Left Atrium: Left atrial size was mildly dilated. Right  Atrium: Right atrial size was normal in size Pericardium: There is no evidence of pericardial effusion. Mitral Valve: The mitral valve is normal in structure. Trace mitral valve regurgitation. Tricuspid Valve: The tricuspid valve is normal in structure. Tricuspid valve regurgitation is mild. Aortic Valve: The aortic valve is normal in structure. Aortic valve regurgitation is not visualized. Mild aortic valve sclerosis is present, with no evidence of aortic valve stenosis. Pulmonic Valve: The pulmonic valve was grossly normal. Pulmonic valve regurgitation is not visualized. Aorta: The aortic root and ascending aorta are structurally normal, with no evidence of dilitation. Venous: The inferior vena cava is normal in size with greater than 50% respiratory variability, suggesting right atrial pressure of 3 mmHg. IAS/Shunts: No atrial level shunt detected by color flow Doppler.  LEFT VENTRICLE PLAX 2D LVIDd:         3.60 cm       Diastology LVIDs:         2.90 cm       LV e' lateral:   10.10 cm/s LV PW:         1.00 cm       LV E/e' lateral: 6.0 LV IVS:        0.80 cm       LV e' medial:    7.72 cm/s LVOT diam:     1.70 cm       LV E/e' medial:  7.9 LV SV:         22 ml LV SV Index:   12.32         2D Longitudinal Strain LVOT Area:     2.27 cm      2D Strain GLS Avg:     -17.4 %  LV Volumes (MOD) LV area d, A2C:    25.70 cm 3D Volume EF: LV area d, A4C:    27.70 cm 3D EF:        53 % LV area s, A2C:    16.40 cm LV EDV:       110 ml LV area s, A4C:    17.10 cm LV ESV:       52 ml LV major d, A2C:   7.75 cm   LV SV:         59 ml LV major d, A4C:   7.66 cm LV major s, A2C:   6.34 cm LV major s, A4C:   6.45 cm LV vol d, MOD A2C: 68.7 ml LV vol d, MOD A4C: 83.9 ml LV vol s, MOD A2C: 34.2 ml LV vol s, MOD A4C: 38.8 ml LV SV MOD A2C:     34.5 ml LV SV MOD A4C:     83.9 ml LV SV MOD BP:      39.7 ml RIGHT VENTRICLE RV S prime:     8.16 cm/s TAPSE (M-mode): 1.9 cm LEFT ATRIUM             Index       RIGHT ATRIUM           Index LA diam:        3.60 cm 2.11 cm/m  RA Area:     15.00 cm LA Vol (A2C):   35.8 ml 21.03 ml/m RA Volume:   36.30 ml  21.33 ml/m LA Vol (A4C):   39.3 ml 23.09 ml/m LA Biplane Vol: 38.7 ml 22.74 ml/m  AORTIC VALVE LVOT Vmax:   79.60 cm/s LVOT Vmean:  52.300 cm/s LVOT VTI:  0.175 m  AORTA Ao Root diam: 2.70 cm Ao Asc diam:  3.20 cm MITRAL VALVE                        TRICUSPID VALVE MV Area (PHT): 3.37 cm             TR Peak grad:   27.2 mmHg MV PHT:        65.25 msec           TR Vmax:        261.00 cm/s MV Decel Time: 225 msec MV E velocity: 60.80 cm/s 103 cm/s  SHUNTS MV A velocity: 66.00 cm/s 70.3 cm/s Systemic VTI:  0.18 m MV E/A ratio:  0.92       1.5       Systemic Diam: 1.70 cm  Glori Bickers MD Electronically signed by Glori Bickers MD Signature Date/Time: 06/08/2019/11:04:32 AM    Final      ELIGIBLE FOR AVAILABLE RESEARCH PROTOCOL: PREVENT  ASSESSMENT: 73 y.o. Many woman status post right breast upper outer quadrant biopsy 06/02/2018 for a clinical T1b N0, stage IB invasive ductal carcinoma, grade 2, triple negative, with an MIB-1 of 10%  (1) status post right lumpectomy and sentinel lymph node sampling 07/02/2018 for a pT1b pN0,stage IA invasive ductal carcinoma, grade 2, now HER-2 positive, still estrogen and progesterone receptor negative, with an MIB-1 of 1%  (2) adjuvant chemotherapy consisting of paclitaxel x12 with trastuzumab given weekly started 07/27/2018  (a) paclitaxel discontinued after 3 doses because of neuropathy-- last dose 08/09/2018  (3) trastuzumab  continued to total 1 year (last dose 07/04/2019)  (a) echocardiogram 07/22/2018 showed an ejection fraction in the 55-60% range.  (b) Echocardiogram on 12/14/2018 shows EF of 55-60%  (c) echo 03/03/2019 shows ejection fraction in the 50-55% range  (d) echo 06/08/2019 shows an ejection fraction in the 50-55% range  (3) adjuvant radiation 09/20/2018 - 10/15/2018  (a) right breast / 40.05 Gy in 15 fractions  (b) boost / 10 Gy in 5 fractions  (4) genetics testing 06/01/2019 through the [facility] Common Hereditary Cancers Panel found no deleterious mutations in APC, ATM, AXIN2, BARD1, BMPR1A, BRCA1, BRCA2, BRIP1, CDH1, CDKN2A (p14ARF), CDKN2A (p16INK4a), CKD4, CHEK2, CTNNA1, DICER1, EPCAM (Deletion/duplication testing only), GREM1 (promoter region deletion/duplication testing only), KIT, MEN1, MLH1, MSH2, MSH3, MSH6, MUTYH, NBN, NF1, NHTL1, PALB2, PDGFRA, PMS2, POLD1, POLE, PTEN, RAD50, RAD51C, RAD51D, RNF43, SDHB, SDHC, SDHD, SMAD4, SMARCA4. STK11, TP53, TSC1, TSC2, and VHL.  The following genes were evaluated for sequence changes only: SDHA and HOXB13 c.251G>A variant only. The report date is 06/01/2019.   PLAN: Rachel Kelly is now a year out from definitive surgery for her breast cancer with no evidence of disease recurrence.  This is very favorable.  She completes her trastuzumab treatments today.  She has had no treatment delays or dose reductions and she has maintained an excellent ejection fraction.  She may have her port removed at any time and I have contacted the surgical group to schedule that for her at their and her discretion.  I am glad the films of her lower back do not show any evidence of metastatic disease.  She is taking appropriate pandemic precautions  She will see me again late March.  At that point we will start seeing her on an every 40-monthbasis  She knows to call for any other issue that may develop before the next visit.  Lynk Marti, GVirgie Dad MD  07/04/19 1:48  PM Medical  Oncology and Hematology [facility] [place], [place] [address] Tel. [phone]    Fax. [phone]   I, Wilburn Mylar, am acting as scribe for Dr. [name]. Roald Lukacs.  I, Lurline Del MD, have reviewed the above documentation for accuracy and completeness, and I agree with the above.

## 2019-07-04 NOTE — Patient Instructions (Signed)
Trastuzumab injection for infusion What is this medicine? TRASTUZUMAB (tras TOO zoo mab) is a monoclonal antibody. It is used to treat breast cancer and stomach cancer. This medicine may be used for other purposes; ask your health care provider or pharmacist if you have questions. COMMON BRAND NAME(S): Herceptin, Herzuma, KANJINTI, Ogivri, Ontruzant, Trazimera What should I tell my health care provider before I take this medicine? They need to know if you have any of these conditions:  heart disease  heart failure  lung or breathing disease, like asthma  an unusual or allergic reaction to trastuzumab, benzyl alcohol, or other medications, foods, dyes, or preservatives  pregnant or trying to get pregnant  breast-feeding How should I use this medicine? This drug is given as an infusion into a vein. It is administered in a hospital or clinic by a specially trained health care professional. Talk to your pediatrician regarding the use of this medicine in children. This medicine is not approved for use in children. Overdosage: If you think you have taken too much of this medicine contact a poison control center or emergency room at once. NOTE: This medicine is only for you. Do not share this medicine with others. What if I miss a dose? It is important not to miss a dose. Call your doctor or health care professional if you are unable to keep an appointment. What may interact with this medicine? This medicine may interact with the following medications:  certain types of chemotherapy, such as daunorubicin, doxorubicin, epirubicin, and idarubicin This list may not describe all possible interactions. Give your health care provider a list of all the medicines, herbs, non-prescription drugs, or dietary supplements you use. Also tell them if you smoke, drink alcohol, or use illegal drugs. Some items may interact with your medicine. What should I watch for while using this medicine? Visit your  doctor for checks on your progress. Report any side effects. Continue your course of treatment even though you feel ill unless your doctor tells you to stop. Call your doctor or health care professional for advice if you get a fever, chills or sore throat, or other symptoms of a cold or flu. Do not treat yourself. Try to avoid being around people who are sick. You may experience fever, chills and shaking during your first infusion. These effects are usually mild and can be treated with other medicines. Report any side effects during the infusion to your health care professional. Fever and chills usually do not happen with later infusions. Do not become pregnant while taking this medicine or for 7 months after stopping it. Women should inform their doctor if they wish to become pregnant or think they might be pregnant. Women of child-bearing potential will need to have a negative pregnancy test before starting this medicine. There is a potential for serious side effects to an unborn child. Talk to your health care professional or pharmacist for more information. Do not breast-feed an infant while taking this medicine or for 7 months after stopping it. Women must use effective birth control with this medicine. What side effects may I notice from receiving this medicine? Side effects that you should report to your doctor or health care professional as soon as possible:  allergic reactions like skin rash, itching or hives, swelling of the face, lips, or tongue  chest pain or palpitations  cough  dizziness  feeling faint or lightheaded, falls  fever  general ill feeling or flu-like symptoms  signs of worsening heart failure like   breathing problems; swelling in your legs and feet  unusually weak or tired Side effects that usually do not require medical attention (report to your doctor or health care professional if they continue or are bothersome):  bone pain  changes in  taste  diarrhea  joint pain  nausea/vomiting  weight loss This list may not describe all possible side effects. Call your doctor for medical advice about side effects. You may report side effects to FDA at 1-800-FDA-1088. Where should I keep my medicine? This drug is given in a hospital or clinic and will not be stored at home. NOTE: This sheet is a summary. It may not cover all possible information. If you have questions about this medicine, talk to your doctor, pharmacist, or health care provider.  2020 Elsevier/Gold Standard (2016-07-01 14:37:52)  Coronavirus (COVID-19) Are you at risk?  Are you at risk for the Coronavirus (COVID-19)?  To be considered HIGH RISK for Coronavirus (COVID-19), you have to meet the following criteria:  . Traveled to [place], [place], [place], [place] or [place]; or in the [place] to [place], [place], [place], or [place]; and have fever, cough, and shortness of breath within the last 2 weeks of travel OR . Been in close contact with a person diagnosed with COVID-19 within the last 2 weeks and have fever, cough, and shortness of breath . IF YOU DO NOT MEET THESE CRITERIA, YOU ARE CONSIDERED LOW RISK FOR COVID-19.  What to do if you are HIGH RISK for COVID-19?  . If you are having a medical emergency, call 911. . Seek medical care right away. Before you go to a doctor's office, urgent care or emergency department, call ahead and tell them about your recent travel, contact with someone diagnosed with COVID-19, and your symptoms. You should receive instructions from your physician's office regarding next steps of care.  . When you arrive at healthcare provider, tell the healthcare staff immediately you have returned from visiting [place], [place], [place], [place] or [place]; or traveled in the [place] to [place], [place], [place], or [place]; in the last two weeks or you have been in close contact with a person diagnosed with COVID-19  in the last 2 weeks.   . Tell the health care staff about your symptoms: fever, cough and shortness of breath. . After you have been seen by a medical provider, you will be either: o Tested for (COVID-19) and discharged home on quarantine except to seek medical care if symptoms worsen, and asked to  - Stay home and avoid contact with others until you get your results (4-5 days)  - Avoid travel on public transportation if possible (such as bus, train, or airplane) or o Sent to the Emergency Department by EMS for evaluation, COVID-19 testing, and possible admission depending on your condition and test results.  What to do if you are LOW RISK for COVID-19?  Reduce your risk of any infection by using the same precautions used for avoiding the common cold or flu:  . Wash your hands often with soap and warm water for at least 20 seconds.  If soap and water are not readily available, use an alcohol-based hand sanitizer with at least 60% alcohol.  . If coughing or sneezing, cover your mouth and nose by coughing or sneezing into the elbow areas of your shirt or coat, into a tissue or into your sleeve (not your hands). . Avoid shaking hands with others and consider head nods   or verbal greetings only. . Avoid touching your eyes, nose, or mouth with unwashed hands.  . Avoid close contact with people who are sick. . Avoid places or events with large numbers of people in one location, like concerts or sporting events. . Carefully consider travel plans you have or are making. . If you are planning any travel outside or inside the [place], visit the CDC's Travelers' Health webpage for the latest health notices. . If you have some symptoms but not all symptoms, continue to monitor at home and seek medical attention if your symptoms worsen. . If you are having a medical emergency, call 911.   ADDITIONAL HEALTHCARE OPTIONS FOR PATIENTS  [facility] / e-Visit:  [email or web address]         [facility]: [phone]  [facility]: [phone]                   [facility]: [phone]   

## 2019-07-05 ENCOUNTER — Telehealth: Payer: Self-pay | Admitting: Oncology

## 2019-07-05 NOTE — Telephone Encounter (Signed)
I left a message regarding schedule  

## 2019-07-12 ENCOUNTER — Ambulatory Visit (HOSPITAL_COMMUNITY): Payer: Self-pay | Admitting: General Surgery

## 2019-07-19 ENCOUNTER — Encounter: Payer: Self-pay | Admitting: *Deleted

## 2019-07-26 DIAGNOSIS — N183 Chronic kidney disease, stage 3 unspecified: Secondary | ICD-10-CM | POA: Diagnosis not present

## 2019-07-28 DIAGNOSIS — D4102 Neoplasm of uncertain behavior of left kidney: Secondary | ICD-10-CM | POA: Diagnosis not present

## 2019-08-04 DIAGNOSIS — N2889 Other specified disorders of kidney and ureter: Secondary | ICD-10-CM | POA: Diagnosis not present

## 2019-08-04 DIAGNOSIS — D4102 Neoplasm of uncertain behavior of left kidney: Secondary | ICD-10-CM | POA: Diagnosis not present

## 2019-08-05 ENCOUNTER — Ambulatory Visit (HOSPITAL_COMMUNITY)
Admission: EM | Admit: 2019-08-05 | Discharge: 2019-08-05 | Disposition: A | Discharge status: Home / Self Care | Payer: Medicare PPO | Attending: Internal Medicine | Admitting: Internal Medicine

## 2019-08-05 ENCOUNTER — Encounter (HOSPITAL_COMMUNITY): Payer: Self-pay

## 2019-08-05 ENCOUNTER — Other Ambulatory Visit: Payer: Self-pay

## 2019-08-05 DIAGNOSIS — N390 Urinary tract infection, site not specified: Secondary | ICD-10-CM | POA: Insufficient documentation

## 2019-08-05 LAB — POCT URINALYSIS DIP (DEVICE)
Bilirubin Urine: NEGATIVE
Glucose, UA: NEGATIVE mg/dL
Ketones, ur: NEGATIVE mg/dL
Nitrite: POSITIVE — AB
Protein, ur: 30 mg/dL — AB
Specific Gravity, Urine: 1.02 (ref 1.005–1.030)
Urobilinogen, UA: 0.2 mg/dL (ref 0.0–1.0)
pH: 6 (ref 5.0–8.0)

## 2019-08-05 MED ORDER — CEPHALEXIN 500 MG PO CAPS: 500.0000 mg | ORAL_CAPSULE | Freq: Two times a day (BID) | ORAL | 0 refills | Status: DC

## 2019-08-05 NOTE — Discharge Instructions (Addendum)
Treating you for a urinary tract infection.  Take the medication as prescribed.  Make sure you are drinking plenty of water. Follow up as needed for continued or worsening symptoms

## 2019-08-05 NOTE — ED Triage Notes (Signed)
Pt c/o painful urination, frequency, urgency x 4 days. Denies fever, chills, abd pain, n/v.

## 2019-08-07 LAB — URINE CULTURE: Culture: >=100000 — AB

## 2019-08-08 NOTE — ED Provider Notes (Signed)
[facility]    CSN: [id number] Arrival date & time: 08/05/19  1052      History   Chief Complaint Chief Complaint  Patient presents with  . Dysuria    [name] is a 74 y.o. female.   Pt is a 74 year old female that presents today with dysuria, urinary frequency, and urgency x 4 days. Symptoms have been constant. She has been drinking plenty of fluids. No associated abd pain, back pain, flank pain, fever, chills, nausea  or vomiting. No vaginal symptoms.   ROS per HPI    Dysuria   Past Medical History:  Diagnosis Date  . Anemia   . Blood transfusion 2005  . Family history of breast cancer   . Family history of prostate cancer   . Hernia   . HH (hiatus hernia)   . Hyperlipidemia   . Hypertension   . PAH (pulmonary artery hypertension) ([place])   . Personal history of chemotherapy   . Personal history of radiation therapy   . Sarcoidosis   . Shortness of breath    on exertion  . Sleep apnea    does not use cpap  . Wheezing     Patient Active Problem List   Diagnosis Date Noted  . Genetic testing 06/14/2019  . Family history of prostate cancer   . Family history of breast cancer   . Port-A-Cath in place 08/02/2018  . Pre-op evaluation 06/24/2018  . Malignant neoplasm of upper-outer quadrant of right breast in female, estrogen receptor negative ([place]) 06/07/2018  . Nausea & vomiting 11/21/2012  . Bilateral renal masses 11/21/2012  . Back pain, thoracic 08/14/2012  . Obstructive sleep apnea 05/09/2012  . GERD (gastroesophageal reflux disease) 06/28/2010  . HYPERLIPIDEMIA 12/09/2007  . Essential hypertension 12/09/2007  . Sarcoidosis 11/26/2007  . DYSPNEA 11/26/2007    Past Surgical History:  Procedure Laterality Date  . ABDOMINAL HYSTERECTOMY  1988  . BACK SURGERY  2005   lower  . BREAST LUMPECTOMY WITH RADIOACTIVE SEED AND SENTINEL LYMPH NODE BIOPSY Right 07/02/2018   Procedure: RIGHT BREAST RADIOACTIVE SEED LUMPECTOMY X2 AND RIGHT  AXILLARY  SENTINEL LYMPH NODE BIOPSY;  Surgeon: [name], MD;  Location: [facility];  Service: General;  Laterality: Right;  . ESOPHAGOGASTRODUODENOSCOPY  06/24/2012   Procedure: ESOPHAGOGASTRODUODENOSCOPY (EGD);  Surgeon: [name], MD;  Location: [name];  Service: General;  Laterality: N/A;  . HIATAL HERNIA REPAIR  08/18/2012   Procedure: LAPAROSCOPIC REPAIR OF HIATAL HERNIA;  Surgeon: [name], MD;  Location: [facility];  Service: General;  Laterality: N/A;  Laparoscopic Hiatal Hernia Repair   . INSERTION OF MESH  08/18/2012   Procedure: INSERTION OF MESH;  Surgeon: [name], MD;  Location: [facility];  Service: General;;  . IR IMAGING GUIDED PORT INSERTION  07/19/2018  . PORTACATH PLACEMENT N/A 07/02/2018   Procedure: INSERTION PORT-A-CATH TRIED NO PORT IN PLACE;  Surgeon: [name], MD;  Location: [facility];  Service: General;  Laterality: N/A;  . VESICOVAGINAL FISTULA CLOSURE W/ TAH      OB History   No obstetric history on file.      Home Medications    Prior to Admission medications   Medication Sig Start Date End Date Taking? Authorizing Provider  Ascorbic Acid (VITAMIN C) 1000 MG tablet Take 1,000 mg by mouth every other day.     [provider]  cephALEXin (KEFLEX) 500 MG capsule Take 1 capsule (500 mg total) by mouth  2 (two) times daily for 7 days. 08/05/19 08/12/19  Loura Halt A, NP  Cholecalciferol (VITAMIN D) 2000 UNITS CAPS Take 2,000 Units by mouth daily.     [provider]  Coenzyme Q10 200 MG TABS Take 200 mg by mouth daily.    [provider]  gabapentin (NEURONTIN) 100 MG capsule Take 100 mg by mouth at bedtime. 04/27/19   [provider]  hydrocortisone 2.5 % cream Apply 1 application topically 2 (two) times daily as needed (dry skin).    [provider]  ibuprofen (ADVIL,MOTRIN) 200 MG tablet Take 400 mg by mouth every 6 (six) hours as needed for headache or moderate pain.    [provider]  ketotifen (ALAWAY) 0.025 % ophthalmic solution Place 1 drop into both eyes 2 (two) times daily.    [provider]  loperamide (IMODIUM A-D) 2 MG capsule Take 2 mg by mouth daily as needed for diarrhea or loose stools.    [provider]  omeprazole (PRILOSEC) 20 MG capsule Take 20 mg by mouth daily.     [provider]  Polyvinyl Alcohol-Povidone PF (REFRESH) 1.4-0.6 % SOLN Place 1 drop into both eyes daily as needed (dry eyes).     [provider]  potassium gluconate 595 MG TABS Take 595 mg by mouth daily.     [provider]  simvastatin (ZOCOR) 20 MG tablet Take 20 mg by mouth every morning.     [provider]  traMADol (ULTRAM) 50 MG tablet Take 1 tablet (50 mg total) by mouth every 6 (six) hours as needed. 07/02/18   [name], MD  triamterene-hydrochlorothiazide (MAXZIDE-25) 37.5-25 MG per tablet Take 1 tablet by mouth every morning.     [provider]  prochlorperazine (COMPAZINE) 10 MG tablet Take 1 tablet (10 mg total) by mouth every 6 (six) hours as needed (Nausea or vomiting). 07/13/18 08/20/18  [name], [name], MD    Family History Family History  Problem Relation Age of Onset  . Clotting disorder Mother        PE  . Heart disease Father   . Breast cancer Maternal Grandmother        spread to brain  . Coronary artery disease Other   . Prostate cancer Brother 44  . Breast cancer Maternal Aunt   . Cancer Maternal Aunt        unk type  . Prostate cancer Paternal Grandfather   . Prostate cancer Brother     Social History Social History   Tobacco Use  . Smoking status: Never Smoker  . Smokeless tobacco: Never Used  Substance Use Topics  . Alcohol use: No  . Drug use: No     Allergies   Penicillins and Skin adhesives [cyanoacrylate]   Review of Systems Review of Systems  Genitourinary: Positive for dysuria.     Physical Exam Triage Vital Signs ED Triage Vitals  Enc  Vitals Group     BP 08/05/19 1136 135/80     Pulse Rate 08/05/19 1136 65     Resp 08/05/19 1136 16     Temp 08/05/19 1136 98.3 F (36.8 C)     Temp Source 08/05/19 1136 Oral     SpO2 08/05/19 1136 100 %     Weight --      Height --      Head Circumference --      Peak Flow --      Pain Score 08/05/19 1132 0  Pain Loc --      Pain Edu? --      Excl. in [place]? --    No data found.  Updated Vital Signs BP 135/80 (BP Location: Left Arm)   Pulse 65   Temp 98.3 F (36.8 C) (Oral)   Resp 16   SpO2 100%   Visual Acuity Right Eye Distance:   Left Eye Distance:   Bilateral Distance:    Right Eye Near:   Left Eye Near:    Bilateral Near:     Physical Exam Vitals and nursing note reviewed.  Constitutional:      General: She is not in acute distress.    Appearance: Normal appearance. She is not ill-appearing, toxic-appearing or diaphoretic.  HENT:     Head: Normocephalic.     Nose: Nose normal.     Mouth/Throat:     Pharynx: Oropharynx is clear.  Eyes:     Conjunctiva/sclera: Conjunctivae normal.  Pulmonary:     Effort: Pulmonary effort is normal.  Abdominal:     Palpations: Abdomen is soft.     Tenderness: There is no abdominal tenderness.  Musculoskeletal:        General: Normal range of motion.     Cervical back: Normal range of motion.  Skin:    General: Skin is warm and dry.     Findings: No rash.  Neurological:     Mental Status: She is alert.  Psychiatric:        Mood and Affect: Mood normal.      UC Treatments / Results  Labs (all labs ordered are listed, but only abnormal results are displayed) Labs Reviewed  URINE CULTURE - Abnormal; Notable for the following components:      Result Value   Culture >=100,000 COLONIES/mL KLEBSIELLA PNEUMONIAE (*)    Organism ID, Bacteria KLEBSIELLA PNEUMONIAE (*)    All other components within normal limits  POCT URINALYSIS DIP (DEVICE) - Abnormal; Notable for the following components:   Hgb urine dipstick  MODERATE (*)    Protein, ur 30 (*)    Nitrite POSITIVE (*)    Leukocytes,Ua LARGE (*)    All other components within normal limits    EKG   Radiology No results found.  Procedures Procedures (including critical care time)  Medications Ordered in UC Medications - No data to display  Initial Impression / Assessment and Plan / UC Course  I have reviewed the triage vital signs and the nursing notes.  Pertinent labs & imaging results that were available during my care of the patient were reviewed by me and considered in my medical decision making (see chart for details).     UTI- urine with large leuks, positive nitrites, moderate blood.  Treating for UTI with keflex and sending urine for culture.  Push fluids Follow up as needed for continued or worsening symptoms  Final Clinical Impressions(s) / UC Diagnoses   Final diagnoses:  Lower urinary tract infectious disease     Discharge Instructions     Treating you for a urinary tract infection.  Take the medication as prescribed.  Make sure you are drinking plenty of water. Follow up as needed for continued or worsening symptoms     ED Prescriptions    Medication Sig Dispense Auth. Provider   cephALEXin (KEFLEX) 500 MG capsule Take 1 capsule (500 mg total) by mouth 2 (two) times daily for 7 days. 14 capsule Caitlin Ainley A, NP     PDMP not reviewed this  encounter.   Orvan July, NP 08/08/19 [phone]

## 2019-08-09 DIAGNOSIS — D4101 Neoplasm of uncertain behavior of right kidney: Secondary | ICD-10-CM | POA: Diagnosis not present

## 2019-08-09 DIAGNOSIS — N183 Chronic kidney disease, stage 3 unspecified: Secondary | ICD-10-CM | POA: Diagnosis not present

## 2019-08-09 DIAGNOSIS — D4102 Neoplasm of uncertain behavior of left kidney: Secondary | ICD-10-CM | POA: Diagnosis not present

## 2019-08-10 DIAGNOSIS — H2511 Age-related nuclear cataract, right eye: Secondary | ICD-10-CM | POA: Diagnosis not present

## 2019-08-10 DIAGNOSIS — H25811 Combined forms of age-related cataract, right eye: Secondary | ICD-10-CM | POA: Diagnosis not present

## 2019-08-11 ENCOUNTER — Other Ambulatory Visit: Payer: Self-pay

## 2019-08-11 ENCOUNTER — Encounter (HOSPITAL_BASED_OUTPATIENT_CLINIC_OR_DEPARTMENT_OTHER): Payer: Self-pay | Admitting: General Surgery

## 2019-08-13 ENCOUNTER — Other Ambulatory Visit (HOSPITAL_COMMUNITY): Payer: Medicare Other

## 2019-08-15 ENCOUNTER — Other Ambulatory Visit (HOSPITAL_COMMUNITY)
Admission: RE | Admit: 2019-08-15 | Discharge: 2019-08-15 | Disposition: A | Discharge status: Home / Self Care | Payer: Medicare PPO | Source: Ambulatory Visit | Attending: General Surgery | Admitting: General Surgery

## 2019-08-15 DIAGNOSIS — Z20822 Contact with and (suspected) exposure to covid-19: Secondary | ICD-10-CM | POA: Insufficient documentation

## 2019-08-15 DIAGNOSIS — Z01812 Encounter for preprocedural laboratory examination: Secondary | ICD-10-CM | POA: Diagnosis not present

## 2019-08-15 LAB — SARS CORONAVIRUS 2 (TAT 6-24 HRS): SARS Coronavirus 2: NEGATIVE

## 2019-08-16 NOTE — Anesthesia Preprocedure Evaluation (Signed)
Anesthesia Evaluation  Patient identified by MRN, date of birth, ID band Patient awake    Reviewed: Allergy & Precautions, NPO status , Patient's Chart, lab work & pertinent test results  Airway Mallampati: III  TM Distance: >3 FB Neck ROM: Full    Dental no notable dental hx. (+) Teeth Intact   Pulmonary shortness of breath and with exertion, sleep apnea ,  Non compliant with CPAP Sarcoidosis   Pulmonary exam normal breath sounds clear to auscultation       Cardiovascular hypertension, Pt. on medications Normal cardiovascular exam Rhythm:Regular Rate:Normal  EKG - NSR, LAFB, unchanged from 2014   Neuro/Psych negative neurological ROS  negative psych ROS   GI/Hepatic Neg liver ROS, hiatal hernia, GERD  ,  Endo/Other  Obesity Hyperlipidemia  Renal/GU Renal InsufficiencyRenal disease  negative genitourinary   Musculoskeletal negative musculoskeletal ROS (+)   Abdominal (+) + obese,   Peds  Hematology  (+) anemia , Hx/o Blood Transfusion 15 years ago back surgery   Anesthesia Other Findings   Reproductive/Obstetrics                             Anesthesia Physical  Anesthesia Plan  ASA: III  Anesthesia Plan: General   Post-op Pain Management:  Regional for Post-op pain   Induction: Intravenous  PONV Risk Score and Plan: 4 or greater and Ondansetron, Dexamethasone and Treatment may vary due to age or medical condition  Airway Management Planned: LMA  Additional Equipment: None  Intra-op Plan:   Post-operative Plan: Extubation in OR  Informed Consent: I have reviewed the patients History and Physical, chart, labs and discussed the procedure including the risks, benefits and alternatives for the proposed anesthesia with the patient or authorized representative who has indicated his/her understanding and acceptance.     Dental advisory given  Plan Discussed with: CRNA and  Surgeon  Anesthesia Plan Comments: (Pulm clearance by Geraldo Pitter, NP 06/21/2018: "Sarcoidosis: Stable. Spirometry today showed no evidence of airway restriction or obstruction. Ambulatory O2 sat after 2 laps 93% room air. Pre-op evaluation: Cleared from a pulmonary standpoint for breast lumpectomy with Dr. [name]")        Anesthesia Quick Evaluation

## 2019-08-17 ENCOUNTER — Encounter (HOSPITAL_BASED_OUTPATIENT_CLINIC_OR_DEPARTMENT_OTHER): Payer: Self-pay | Admitting: General Surgery

## 2019-08-17 ENCOUNTER — Other Ambulatory Visit: Payer: Self-pay

## 2019-08-17 ENCOUNTER — Ambulatory Visit (HOSPITAL_BASED_OUTPATIENT_CLINIC_OR_DEPARTMENT_OTHER): Payer: Medicare PPO | Admitting: Anesthesiology

## 2019-08-17 ENCOUNTER — Encounter (HOSPITAL_BASED_OUTPATIENT_CLINIC_OR_DEPARTMENT_OTHER)
Admission: RE | Disposition: A | Discharge status: Home / Self Care | Payer: Self-pay | Source: Ambulatory Visit | Attending: General Surgery

## 2019-08-17 ENCOUNTER — Ambulatory Visit (HOSPITAL_BASED_OUTPATIENT_CLINIC_OR_DEPARTMENT_OTHER)
Admission: RE | Admit: 2019-08-17 | Discharge: 2019-08-17 | Disposition: A | Discharge status: Home / Self Care | Payer: Medicare PPO | Source: Ambulatory Visit | Attending: General Surgery | Admitting: General Surgery

## 2019-08-17 DIAGNOSIS — Z9221 Personal history of antineoplastic chemotherapy: Secondary | ICD-10-CM | POA: Diagnosis not present

## 2019-08-17 DIAGNOSIS — Z452 Encounter for adjustment and management of vascular access device: Secondary | ICD-10-CM | POA: Diagnosis not present

## 2019-08-17 DIAGNOSIS — G4733 Obstructive sleep apnea (adult) (pediatric): Secondary | ICD-10-CM | POA: Diagnosis not present

## 2019-08-17 DIAGNOSIS — Z79899 Other long term (current) drug therapy: Secondary | ICD-10-CM | POA: Diagnosis not present

## 2019-08-17 DIAGNOSIS — I10 Essential (primary) hypertension: Secondary | ICD-10-CM | POA: Diagnosis not present

## 2019-08-17 DIAGNOSIS — Z853 Personal history of malignant neoplasm of breast: Secondary | ICD-10-CM | POA: Diagnosis not present

## 2019-08-17 DIAGNOSIS — Z171 Estrogen receptor negative status [ER-]: Secondary | ICD-10-CM | POA: Insufficient documentation

## 2019-08-17 HISTORY — PX: PORT-A-CATH REMOVAL: SHX5289

## 2019-08-17 LAB — BASIC METABOLIC PANEL
Anion gap: 10 (ref 5–15)
BUN: 20 mg/dL (ref 8–23)
CO2: 24 mmol/L (ref 22–32)
Calcium: 9.4 mg/dL (ref 8.9–10.3)
Chloride: 105 mmol/L (ref 98–111)
Creatinine, Ser: 1.18 mg/dL — ABNORMAL HIGH (ref 0.44–1.00)
GFR calc Af Amer: 53 mL/min — ABNORMAL LOW (ref >60)
GFR calc non Af Amer: 45 mL/min — ABNORMAL LOW (ref >60)
Glucose, Bld: 95 mg/dL (ref 70–99)
Potassium: 4 mmol/L (ref 3.5–5.1)
Sodium: 139 mmol/L (ref 135–145)

## 2019-08-17 SURGERY — REMOVAL PORT-A-CATH
Anesthesia: General | Site: Chest | Laterality: Right

## 2019-08-17 MED ORDER — FENTANYL CITRATE (PF) 100 MCG/2ML IJ SOLN: 25.0000 ug | INTRAMUSCULAR | Status: DC | PRN

## 2019-08-17 MED ORDER — HYDROCODONE-ACETAMINOPHEN 5-325 MG PO TABS: 1.0000 | ORAL_TABLET | Freq: Four times a day (QID) | ORAL | 0 refills | Status: DC | PRN

## 2019-08-17 MED ORDER — PROPOFOL 10 MG/ML IV BOLUS
INTRAVENOUS | Status: DC | PRN
  Administered 2019-08-17: 150 mg via INTRAVENOUS

## 2019-08-17 MED ORDER — MEPERIDINE HCL 25 MG/ML IJ SOLN: 6.2500 mg | INTRAMUSCULAR | Status: DC | PRN

## 2019-08-17 MED ORDER — DEXAMETHASONE SODIUM PHOSPHATE 10 MG/ML IJ SOLN
INTRAMUSCULAR | Status: DC | PRN
  Administered 2019-08-17: 4 mg via INTRAVENOUS

## 2019-08-17 MED ORDER — CELECOXIB 200 MG PO CAPS
200.0000 mg | ORAL_CAPSULE | ORAL | Status: AC
  Administered 2019-08-17: 200 mg via ORAL

## 2019-08-17 MED ORDER — ACETAMINOPHEN 500 MG PO TABS
1000.0000 mg | ORAL_TABLET | ORAL | Status: AC
  Administered 2019-08-17: 1000 mg via ORAL

## 2019-08-17 MED ORDER — GABAPENTIN 300 MG PO CAPS
ORAL_CAPSULE | ORAL | Status: AC
  Filled 2019-08-17: qty 1

## 2019-08-17 MED ORDER — GABAPENTIN 300 MG PO CAPS
300.0000 mg | ORAL_CAPSULE | ORAL | Status: AC
  Administered 2019-08-17: 300 mg via ORAL

## 2019-08-17 MED ORDER — BUPIVACAINE HCL (PF) 0.25 % IJ SOLN
INTRAMUSCULAR | Status: DC | PRN
  Administered 2019-08-17: 7 mL

## 2019-08-17 MED ORDER — ONDANSETRON HCL 4 MG/2ML IJ SOLN: 4.0000 mg | Freq: Once | INTRAMUSCULAR | Status: DC | PRN

## 2019-08-17 MED ORDER — BUPIVACAINE HCL (PF) 0.25 % IJ SOLN
INTRAMUSCULAR | Status: AC
  Filled 2019-08-17: qty 150

## 2019-08-17 MED ORDER — LACTATED RINGERS IV SOLN: INTRAVENOUS | Status: DC

## 2019-08-17 MED ORDER — ACETAMINOPHEN 500 MG PO TABS
ORAL_TABLET | ORAL | Status: AC
  Filled 2019-08-17: qty 2

## 2019-08-17 MED ORDER — CHLORHEXIDINE GLUCONATE CLOTH 2 % EX PADS: 6.0000 | MEDICATED_PAD | Freq: Once | CUTANEOUS | Status: DC

## 2019-08-17 MED ORDER — ONDANSETRON HCL 4 MG/2ML IJ SOLN
INTRAMUSCULAR | Status: DC | PRN
  Administered 2019-08-17: 4 mg via INTRAVENOUS

## 2019-08-17 MED ORDER — FENTANYL CITRATE (PF) 100 MCG/2ML IJ SOLN
INTRAMUSCULAR | Status: AC
  Filled 2019-08-17: qty 2

## 2019-08-17 MED ORDER — FENTANYL CITRATE (PF) 100 MCG/2ML IJ SOLN
INTRAMUSCULAR | Status: DC | PRN
  Administered 2019-08-17: 100 ug via INTRAVENOUS

## 2019-08-17 MED ORDER — LIDOCAINE HCL (CARDIAC) PF 100 MG/5ML IV SOSY
PREFILLED_SYRINGE | INTRAVENOUS | Status: DC | PRN
  Administered 2019-08-17: 60 mg via INTRAVENOUS

## 2019-08-17 MED ORDER — CELECOXIB 200 MG PO CAPS
ORAL_CAPSULE | ORAL | Status: AC
  Filled 2019-08-17: qty 1

## 2019-08-17 SURGICAL SUPPLY — 35 items
ADH SKN CLS APL DERMABOND .7 (GAUZE/BANDAGES/DRESSINGS) ×1
APL PRP STRL LF DISP 70% ISPRP (MISCELLANEOUS) ×1
BLADE SURG 15 STRL LF DISP TIS (BLADE) ×1 IMPLANT
BLADE SURG 15 STRL SS (BLADE) ×2
CHLORAPREP W/TINT 26 (MISCELLANEOUS) ×2 IMPLANT
COVER BACK TABLE 60X90IN (DRAPES) ×2 IMPLANT
COVER MAYO STAND STRL (DRAPES) ×2 IMPLANT
COVER WAND RF STERILE (DRAPES) IMPLANT
DECANTER SPIKE VIAL GLASS SM (MISCELLANEOUS) IMPLANT
DERMABOND ADVANCED (GAUZE/BANDAGES/DRESSINGS) ×1
DERMABOND ADVANCED .7 DNX12 (GAUZE/BANDAGES/DRESSINGS) ×1 IMPLANT
DRAPE LAPAROTOMY 100X72 PEDS (DRAPES) ×2 IMPLANT
DRAPE UTILITY XL STRL (DRAPES) ×2 IMPLANT
ELECT COATED BLADE 2.86 ST (ELECTRODE) IMPLANT
ELECT REM PT RETURN 9FT ADLT (ELECTROSURGICAL) ×2
ELECTRODE REM PT RTRN 9FT ADLT (ELECTROSURGICAL) ×1 IMPLANT
GLOVE BIO SURGEON STRL SZ7.5 (GLOVE) ×2 IMPLANT
GLOVE BIOGEL PI IND STRL 7.0 (GLOVE) IMPLANT
GLOVE BIOGEL PI INDICATOR 7.0 (GLOVE) ×1
GLOVE EXAM NITRILE MD LF STRL (GLOVE) ×2 IMPLANT
GLOVE SURG SYN 7.5  E (GLOVE) ×1
GLOVE SURG SYN 7.5 E (GLOVE) ×1 IMPLANT
GOWN STRL REUS W/ TWL LRG LVL3 (GOWN DISPOSABLE) ×1 IMPLANT
GOWN STRL REUS W/ TWL XL LVL3 (GOWN DISPOSABLE) ×1 IMPLANT
GOWN STRL REUS W/TWL LRG LVL3 (GOWN DISPOSABLE) ×2
GOWN STRL REUS W/TWL XL LVL3 (GOWN DISPOSABLE) ×2
NEEDLE HYPO 25X1 1.5 SAFETY (NEEDLE) ×2 IMPLANT
PACK BASIN DAY SURGERY FS (CUSTOM PROCEDURE TRAY) ×2 IMPLANT
PENCIL SMOKE EVACUATOR (MISCELLANEOUS) IMPLANT
SLEEVE SCD COMPRESS KNEE MED (MISCELLANEOUS) ×1 IMPLANT
SUT MON AB 4-0 PC3 18 (SUTURE) ×2 IMPLANT
SUT VIC AB 3-0 SH 27 (SUTURE) ×2
SUT VIC AB 3-0 SH 27X BRD (SUTURE) ×1 IMPLANT
SYR CONTROL 10ML LL (SYRINGE) ×2 IMPLANT
TOWEL GREEN STERILE FF (TOWEL DISPOSABLE) ×2 IMPLANT

## 2019-08-17 NOTE — Discharge Instructions (Signed)
°  Post Anesthesia Home Care Instructions  Activity: Get plenty of rest for the remainder of the day. A responsible individual must stay with you for 24 hours following the procedure.  For the next 24 hours, DO NOT: -Drive a car -Paediatric nurse -Drink alcoholic beverages -Take any medication unless instructed by your physician -Make any legal decisions or sign important papers.  Meals: Start with liquid foods such as gelatin or soup. Progress to regular foods as tolerated. Avoid greasy, spicy, heavy foods. If nausea and/or vomiting occur, drink only clear liquids until the nausea and/or vomiting subsides. Call your physician if vomiting continues.  Special Instructions/Symptoms: Your throat may feel dry or sore from the anesthesia or the breathing tube placed in your throat during surgery. If this causes discomfort, gargle with warm salt water. The discomfort should disappear within 24 hours.  If you had a scopolamine patch placed behind your ear for the management of post- operative nausea and/or vomiting:  1. The medication in the patch is effective for 72 hours, after which it should be removed.  Wrap patch in a tissue and discard in the trash. Wash hands thoroughly with soap and water. 2. You may remove the patch earlier than 72 hours if you experience unpleasant side effects which may include dry mouth, dizziness or visual disturbances. 3. Avoid touching the patch. Wash your hands with soap and water after contact with the patch.  No tylenol until after 2pm today.  No ibuprofen until after 4pm today.

## 2019-08-17 NOTE — Anesthesia Postprocedure Evaluation (Signed)
Anesthesia Post Note  Patient: Rachel Kelly  Procedure(s) Performed: REMOVAL PORT-A-CATH (Right Chest)     Patient location during evaluation: PACU Anesthesia Type: General Level of consciousness: sedated and patient cooperative Pain management: pain level controlled Vital Signs Assessment: post-procedure vital signs reviewed and stable Respiratory status: spontaneous breathing Cardiovascular status: stable Anesthetic complications: no    Last Vitals:  Vitals:   08/17/19 0915 08/17/19 0944  BP: 130/76 138/89  Pulse: (!) 57 61  Resp: 18 16  Temp:  36.4 C  SpO2: 97% 100%    Last Pain:  Vitals:   08/17/19 0944  TempSrc:   PainSc: 0-No pain                 Nolon Nations

## 2019-08-17 NOTE — Interval H&P Note (Signed)
History and Physical Interval Note:  08/17/2019 8:13 AM  Rachel Kelly  has presented today for surgery, with the diagnosis of HISTORY OF RIGHT BREAST CANCER.  The various methods of treatment have been discussed with the patient and family. After consideration of risks, benefits and other options for treatment, the patient has consented to  Procedure(s): REMOVAL PORT-A-CATH (N/A) as a surgical intervention.  The patient's history has been reviewed, patient examined, no change in status, stable for surgery.  I have reviewed the patient's chart and labs.  Questions were answered to the patient's satisfaction.     Autumn Messing III

## 2019-08-17 NOTE — Transfer of Care (Signed)
Immediate Anesthesia Transfer of Care Note  Patient: Rachel Kelly  Procedure(s) Performed: REMOVAL PORT-A-CATH (Right Chest)  Patient Location: PACU  Anesthesia Type:General  Level of Consciousness: awake, alert , oriented and patient cooperative  Airway & Oxygen Therapy: Patient Spontanous Breathing and Patient connected to face mask oxygen  Post-op Assessment: Report given to RN and Post -op Vital signs reviewed and stable  Post vital signs: Reviewed and stable  Last Vitals:  Vitals Value Taken Time  BP    Temp    Pulse 82 08/17/19 0858  Resp    SpO2 100 % 08/17/19 0858  Vitals shown include unvalidated device data.  Last Pain:  Vitals:   08/17/19 0735  TempSrc: Oral  PainSc: 0-No pain         Complications: No apparent anesthesia complications

## 2019-08-17 NOTE — Op Note (Signed)
08/17/2019  8:53 AM  PATIENT:  Rachel Kelly  74 y.o. female  PRE-OPERATIVE DIAGNOSIS:  HISTORY OF RIGHT BREAST CANCER  POST-OPERATIVE DIAGNOSIS:  HISTORY OF RIGHT BREAST CANCER  PROCEDURE:  Procedure(s): REMOVAL PORT-A-CATH (Right)  SURGEON:  Surgeon(s) and Role:    * Jovita Kussmaul, MD - Primary  PHYSICIAN ASSISTANT:   ASSISTANTS: none   ANESTHESIA:   local and general  EBL:  minimal   BLOOD ADMINISTERED:none  DRAINS: none   LOCAL MEDICATIONS USED:  MARCAINE     SPECIMEN:  No Specimen  DISPOSITION OF SPECIMEN:  N/A  COUNTS:  YES  TOURNIQUET:  * No tourniquets in log *  DICTATION: .Dragon Dictation   After informed consent was obtained the patient was brought to the operating room and placed in the supine position on the operating table.  After adequate induction of general anesthesia the patient's right chest was prepped with ChloraPrep, allowed to dry, and draped in usual sterile manner.  An appropriate timeout was performed.  The area around the port was infiltrated with quarter percent Marcaine.  A small incision was made with a 15 blade knife through her previous incision.  The incision was carried through the subcutaneous tissue sharply with a 15 blade knife until the capsule surrounding the port was opened.  The port was then gently freed from the edges of the cavity by blunt hemostat dissection and then pushed out of the pocket.  With gentle traction the port was removed from the patient without difficulty.  Pressure was held for several minutes until the area was completely hemostatic.  The deep layer of the wound was then closed with interrupted 3-0 Vicryl stitches.  The skin was then closed with a running 4-0 Monocryl subcuticular stitch.  Dermabond dressings were applied.  The patient tolerated the procedure well.  At the end of the case all needle sponge and instrument counts were correct.  The patient was then awakened and taken to recovery in stable  condition.  PLAN OF CARE: Discharge to home after PACU  PATIENT DISPOSITION:  PACU - hemodynamically stable.   Delay start of Pharmacological VTE agent (>24hrs) due to surgical blood loss or risk of bleeding: not applicable

## 2019-08-17 NOTE — Anesthesia Procedure Notes (Signed)
Procedure Name: LMA Insertion Date/Time: 08/17/2019 8:27 AM Performed by: Signe Colt, CRNA Pre-anesthesia Checklist: Patient identified, Emergency Drugs available, Suction available and Patient being monitored Patient Re-evaluated:Patient Re-evaluated prior to induction Oxygen Delivery Method: Circle system utilized Preoxygenation: Pre-oxygenation with 100% oxygen Induction Type: IV induction Ventilation: Mask ventilation without difficulty LMA: LMA inserted LMA Size: 4.0 Number of attempts: 1 Airway Equipment and Method: Bite block Placement Confirmation: positive ETCO2 Tube secured with: Tape Dental Injury: Teeth and Oropharynx as per pre-operative assessment

## 2019-08-17 NOTE — H&P (Signed)
Rachel Kelly  Location: [facility] Patient #: [id number] DOB: [birth year] Single / Language: Cleophus Molt / Race: Black or African American Female   History of Present Illness The patient is a 74 year old female who presents with breast cancer. She returns following right breast lumpectomy and negative sentinel lymph node biopsy surgery date July 02, 2018. Her original presentation was as below:  She is a postmenopausal female referred by Dr. Margarette Canada for evaluation of recently diagnosed carcinoma of the right breast. She recently presented for a screening mamogram revealing a possible mass and calcifications with adjacent asymmetry in the right breast. it had been about 2 years since her previous screening mammogram.Subsequent imaging included diagnostic mamogram showing a persistent asymmetry and associated calcifications lower outer right breast middle depth as well as a spiculated mass in the lateral breast middle depth, and ultrasound showing an irregular hypoechoic mass at the 10:00 position 8 cm from the nipple measuring 10 x 7 mm and just adjacent to this mass a second mass or extension measuring 9 mm. An additional 6 mm hypoechoic mass is demonstrated at the 9:00 position 6 cm from the nipple gnosis suspicious adenopathy.. 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 Review of imaging today indicates the 2 adjacent masses at the 10:00 position are essentially on top of each other. She is seen now in breast multidisciplinary clinic for initial treatment planning. She has experienced no breast symptoms, specifically lump or pain or discharge. She does not have a personal history of any previous breast problems.  Findings at that time were the following: Tumor size: 1 cm and 0.9 cm adjacent at the 10:00 position and 6 mm  papilloma at the 9:00 position Tumor grade: 2 Estrogen Receptor: negative Progesterone Receptor: negative Her-2 neu: negative Lymph node status: negative  Final pathology revealed a 0.8 cm grade 2 invasive ductal carcinoma, all margins negative, 2 negative sentinel lymph nodes, ER/PR negative and HER-2 positive  She has started her chemotherapy and tolerating this well. No complaints in relation to her breast or surgery.    Allergies  Adhesive Bandages *MEDICAL DEVICES AND SUPPLIES*  PenicillAMINE *ASSORTED CLASSES*  Allergies Reconciled   Medication History  NexIUM (40MG Capsule DR, Oral) Active. Triamterene-HCTZ (37.5-25MG Capsule, Oral) Active. Vitamin D (Cholecalciferol) (1000UNIT Capsule, Oral) Active. Potassium (75MG Tablet, Oral) Active. Simvastatin (20MG Tablet, Oral) Active. Magnesium (100MG Capsule, Oral) Active. Align (4MG Capsule, Oral) Active. Co Q 10 (10MG Capsule, Oral) Active. Potassium Bicarbonate (25MEQ Tablet Effer, Oral) Active. Phenazopyridine HCl (100MG Tablet, Oral) Active. Triamterene-HCTZ (37.5-25MG Tablet, Oral) Active. Medications Reconciled  Vitals  Weight: 169.4 lb Height: 58in Body Surface Area: 1.7 m Body Mass Index: 35.4 kg/m  Temp.: 97.33F  Pulse: 84 (Regular)  BP: 122/82 (Sitting, Left Arm, Standard)       Physical Exam The physical exam findings are as follows: Note:General: Appears well Breasts: Right breast lumpectomy incision healing well. Port-A-Cath on the right (placed by radiology) site unremarkable    Assessment & Plan  MALIGNANT NEOPLASM OF UPPER-OUTER QUADRANT OF RIGHT BREAST IN FEMALE, ESTROGEN RECEPTOR NEGATIVE (C50.411) Impression: Doing well post lumpectomy and negative sentinel lymph node biopsy for ER PR negative HER-2 +0.8 cm invasive ductal carcinoma right breast. Tolerating chemotherapy well initially. Current Plans Follow up with [place] in the office in 6 MONTHS.   Call us sooner  as needed.

## 2019-08-18 ENCOUNTER — Encounter: Payer: Self-pay | Admitting: *Deleted

## 2019-08-18 DIAGNOSIS — H2512 Age-related nuclear cataract, left eye: Secondary | ICD-10-CM | POA: Diagnosis not present

## 2019-08-18 DIAGNOSIS — H25012 Cortical age-related cataract, left eye: Secondary | ICD-10-CM | POA: Diagnosis not present

## 2019-08-24 DIAGNOSIS — H25812 Combined forms of age-related cataract, left eye: Secondary | ICD-10-CM | POA: Diagnosis not present

## 2019-08-24 DIAGNOSIS — H2512 Age-related nuclear cataract, left eye: Secondary | ICD-10-CM | POA: Diagnosis not present

## 2019-08-24 DIAGNOSIS — H25012 Cortical age-related cataract, left eye: Secondary | ICD-10-CM | POA: Diagnosis not present

## 2019-09-07 ENCOUNTER — Ambulatory Visit: Payer: Medicare PPO | Admitting: Primary Care

## 2019-09-07 ENCOUNTER — Other Ambulatory Visit: Payer: Self-pay

## 2019-09-07 ENCOUNTER — Encounter: Payer: Self-pay | Admitting: Primary Care

## 2019-09-07 ENCOUNTER — Ambulatory Visit (INDEPENDENT_AMBULATORY_CARE_PROVIDER_SITE_OTHER): Payer: Medicare PPO

## 2019-09-07 VITALS — BP 130/76 | HR 76 | Temp 97.7°F | Ht 59.0 in | Wt 165.0 lb

## 2019-09-07 DIAGNOSIS — Z01818 Encounter for other preprocedural examination: Secondary | ICD-10-CM

## 2019-09-07 DIAGNOSIS — D869 Sarcoidosis, unspecified: Secondary | ICD-10-CM

## 2019-09-07 DIAGNOSIS — G4733 Obstructive sleep apnea (adult) (pediatric): Secondary | ICD-10-CM | POA: Diagnosis not present

## 2019-09-07 DIAGNOSIS — R590 Localized enlarged lymph nodes: Secondary | ICD-10-CM | POA: Diagnosis not present

## 2019-09-07 NOTE — Assessment & Plan Note (Signed)
-   No active respiratory symptoms, including shortness of breath, chest tightness or cough  - Pre-op CXR pending - Spirometry in 2019 showed no evidence of airway restriction or obstruction

## 2019-09-07 NOTE — Assessment & Plan Note (Signed)
-   Intolerant to CPAP - She is down 10 lbs from sleep study in 2013  - No nocturnal shortness of breath or witness apnea

## 2019-09-07 NOTE — Progress Notes (Signed)
@Patient  ID: Rachel Kelly, female    DOB: [birth year], 74 y.o.   MRN: [id number]  Chief Complaint  Patient presents with  . Follow-up    Pre-op Clearance for surgery.     Referring provider: [name], MD  HPI: 74 year old female, never smoked. PMH significant for OSA (CPAP intolerant), sarcoidosis, hypertension, GERD, hyperlipidemia, breast cancer. Patient of Dr. Annamaria Boots, last seen by pulmonary NP on 06/21/18. NPSG- 05/26/12- AHI 17.6/ hr. Moderate obstructive sleep apnea, body weight 176 pounds.   09/07/2019 Patient presents today for pre-op clearance. She follow-ups with our office for history of sleep apnea and sarcoidosis. She is doing well, no acute respiratory complaints or active sarcoidosis symptoms. She is not on CPAP therapy d/t intolerance. She is sleeping well at night, wakes up to use bathroom 1-2 times at night. No nocturnal shortness of breath. Spirometry in 2019 showed no evidence of airway restriction or obstruction. Denies shortness of breath, chest tightness, chest pain, wheezing or cough.   Allergies  Allergen Reactions  . Shellfish Allergy Other (See Comments)    Pain all over  . Penicillins Rash    Fine red bumps that spread all over the body. Has patient had a PCN reaction causing immediate rash, facial/tongue/throat swelling, SOB or lightheadedness with hypotension: No Has patient had a PCN reaction causing severe rash involving mucus membranes or skin necrosis: No Has patient had a PCN reaction that required hospitalization: No Has patient had a PCN reaction occurring within the last 10 years: No If all of the above answers are "NO", then may proceed with Cephalosporin use.   . Skin Adhesives [Cyanoacrylate] Itching and Rash    steri strips    Immunization History  Administered Date(s) Administered  . Hepatitis A, Adult 08/18/2017  . Influenza Split 06/07/2012, 05/22/2013  . Influenza, High Dose Seasonal PF 04/21/2018, 05/11/2019  .  Influenza-Unspecified 06/20/2014  . Zoster 06/20/2014    Past Medical History:  Diagnosis Date  . Anemia   . Blood transfusion 2005  . Family history of breast cancer   . Family history of prostate cancer   . Hernia   . HH (hiatus hernia)   . Hyperlipidemia   . Hypertension   . PAH (pulmonary artery hypertension) ([place])   . Personal history of chemotherapy   . Personal history of radiation therapy   . Sarcoidosis   . Shortness of breath    on exertion  . Sleep apnea    does not use cpap  . Wheezing     Tobacco History: Social History   Tobacco Use  Smoking Status Never Smoker  Smokeless Tobacco Never Used   Counseling given: Not Answered   Outpatient Medications Prior to Visit  Medication Sig Dispense Refill  . Ascorbic Acid (VITAMIN C) 1000 MG tablet Take 1,000 mg by mouth every other day.     . Cholecalciferol (VITAMIN D) 2000 UNITS CAPS Take 2,000 Units by mouth daily.     . Coenzyme Q10 200 MG TABS Take 200 mg by mouth daily.    Marland Kitchen FLUOROMETHOLONE ACETATE OP Apply to eye every morning.    . hydrocortisone 2.5 % cream Apply 1 application topically 2 (two) times daily as needed (dry skin).    Marland Kitchen ibuprofen (ADVIL,MOTRIN) 200 MG tablet Take 400 mg by mouth every 6 (six) hours as needed for headache or moderate pain.    Marland Kitchen ketotifen (ALAWAY) 0.025 % ophthalmic solution Place 1 drop into both eyes 2 (two) times daily.    Marland Kitchen  loperamide (IMODIUM A-D) 2 MG capsule Take 2 mg by mouth daily as needed for diarrhea or loose stools.    Marland Kitchen omeprazole (PRILOSEC) 20 MG capsule Take 20 mg by mouth daily.     . Polyvinyl Alcohol-Povidone PF (REFRESH) 1.4-0.6 % SOLN Place 1 drop into both eyes daily as needed (dry eyes).     . potassium gluconate 595 MG TABS Take 595 mg by mouth daily.     . simvastatin (ZOCOR) 20 MG tablet Take 20 mg by mouth every morning.     . triamterene-hydrochlorothiazide (MAXZIDE-25) 37.5-25 MG per tablet Take 1 tablet by mouth every morning.     . prednisoLONE  acetate (PRED MILD) 0.12 % ophthalmic suspension Place 1 drop into the right eye 4 (four) times daily.    Marland Kitchen HYDROcodone-acetaminophen (NORCO/VICODIN) 5-325 MG tablet Take 1-2 tablets by mouth every 6 (six) hours as needed for moderate pain or severe pain. 5 tablet 0   No facility-administered medications prior to visit.   Review of Systems  Review of Systems  Constitutional: Negative.   Respiratory: Negative.   Cardiovascular: Negative.   Psychiatric/Behavioral: Negative.    Physical Exam  BP 130/76 (BP Location: Left Arm, Patient Position: Sitting, Cuff Size: Normal)   Pulse 76   Temp 97.7 F (36.5 C) (Temporal)   Ht 4\' 11"  (1.499 m)   Wt 165 lb (74.8 kg)   SpO2 97% Comment: room air  BMI 33.33 kg/m  Physical Exam Constitutional:      General: She is not in acute distress.    Appearance: Normal appearance. She is not ill-appearing.  HENT:     Head: Normocephalic and atraumatic.     Mouth/Throat:     Mouth: Mucous membranes are moist.     Pharynx: Oropharynx is clear. No oropharyngeal exudate or posterior oropharyngeal erythema.     Comments: Mallampati class I-II. Partial upper bridge Cardiovascular:     Rate and Rhythm: Normal rate and regular rhythm.     Heart sounds: No murmur.  Pulmonary:     Effort: Pulmonary effort is normal.     Breath sounds: Normal breath sounds.     Comments: CTA Musculoskeletal:        General: Normal range of motion.     Cervical back: Normal range of motion and neck supple.  Skin:    General: Skin is warm and dry.  Neurological:     General: No focal deficit present.     Mental Status: She is alert and oriented to person, place, and time. Mental status is at baseline.  Psychiatric:        Mood and Affect: Mood normal.        Behavior: Behavior normal.        Thought Content: Thought content normal.        Judgment: Judgment normal.      Lab Results:  CBC    Component Value Date/Time   WBC 4.6 07/04/2019 1207   RBC 3.84 (L)  07/04/2019 1207   HGB 11.8 (L) 07/04/2019 1207   HGB 10.1 (L) 08/16/2018 1155   HCT 35.5 (L) 07/04/2019 1207   PLT 172 07/04/2019 1207   PLT 264 08/16/2018 1155   MCV 92.4 07/04/2019 1207   MCH 30.7 07/04/2019 1207   MCHC 33.2 07/04/2019 1207   RDW 13.0 07/04/2019 1207   LYMPHSABS 1.3 07/04/2019 1207   MONOABS 0.4 07/04/2019 1207   EOSABS 0.1 07/04/2019 1207   BASOSABS 0.0 07/04/2019 1207  BMET    Component Value Date/Time   NA 139 08/17/2019 0745   K 4.0 08/17/2019 0745   CL 105 08/17/2019 0745   CO2 24 08/17/2019 0745   GLUCOSE 95 08/17/2019 0745   BUN 20 08/17/2019 0745   CREATININE 1.18 (H) 08/17/2019 0745   CREATININE 1.18 (H) 01/17/2019 1305   CALCIUM 9.4 08/17/2019 0745   GFRNONAA 45 (L) 08/17/2019 0745   GFRNONAA 45 (L) 01/17/2019 1305   GFRAA 53 (L) 08/17/2019 0745   GFRAA 53 (L) 01/17/2019 1305    BNP No results found for: BNP  ProBNP No results found for: PROBNP  Imaging: No results found.   Assessment & Plan:   Pre-op evaluation - Exam benign, VSS - No active respiratory symptoms  - Spirometry in 2019 showed no evidence of airway restriction or obstruction.  - Cleared from pulmonary standpoint for surgery   Recommend 1. Short duration of surgery as much as possible and avoid paralytic if possible 2. Recovery in step down or ICU with Pulmonary consultation 3. DVT prophylaxis 4. Aggressive pulmonary toilet with o2, bronchodilatation, and incentive spirometry and early ambulation   Obstructive sleep apnea - Intolerant to CPAP - She is down 10 lbs from sleep study in 2013  - No nocturnal shortness of breath or witness apnea   Sarcoidosis - No active respiratory symptoms, including shortness of breath, chest tightness or cough  - Pre-op CXR pending - Spirometry in 2019 showed no evidence of airway restriction or obstruction   Martyn Ehrich, NP 09/07/2019

## 2019-09-07 NOTE — Patient Instructions (Addendum)
Recommendations: - Pre-op CXR today - We will send clearance form next week  - If you develop any upper respiratory symptoms before surgery let our office know  Follow-up: - 6 months with Dr. Annamaria Boots    Recommend 1. Short duration of surgery as much as possible and avoid paralytic if possible 2. Recovery in step down or ICU with Pulmonary consultation 3. DVT prophylaxis 4. Aggressive pulmonary toilet with o2, bronchodilatation, and incentive spirometry and early ambulation

## 2019-09-07 NOTE — Assessment & Plan Note (Addendum)
-   Exam benign, VSS - No active respiratory symptoms  - Spirometry in 2019 showed no evidence of airway restriction or obstruction.  - Cleared from pulmonary standpoint for surgery   Recommend 1. Short duration of surgery as much as possible and avoid paralytic if possible 2. Recovery in step down or ICU with Pulmonary consultation 3. DVT prophylaxis 4. Aggressive pulmonary toilet with o2, bronchodilatation, and incentive spirometry and early ambulation

## 2019-09-20 DIAGNOSIS — M4316 Spondylolisthesis, lumbar region: Secondary | ICD-10-CM | POA: Diagnosis not present

## 2019-09-20 DIAGNOSIS — M4716 Other spondylosis with myelopathy, lumbar region: Secondary | ICD-10-CM | POA: Diagnosis not present

## 2019-09-20 DIAGNOSIS — M4326 Fusion of spine, lumbar region: Secondary | ICD-10-CM | POA: Diagnosis not present

## 2019-09-20 DIAGNOSIS — M48062 Spinal stenosis, lumbar region with neurogenic claudication: Secondary | ICD-10-CM | POA: Diagnosis not present

## 2019-10-04 DIAGNOSIS — Z171 Estrogen receptor negative status [ER-]: Secondary | ICD-10-CM | POA: Diagnosis not present

## 2019-10-04 DIAGNOSIS — C50411 Malignant neoplasm of upper-outer quadrant of right female breast: Secondary | ICD-10-CM | POA: Diagnosis not present

## 2019-10-06 ENCOUNTER — Encounter (HOSPITAL_COMMUNITY): Payer: Self-pay | Admitting: Internal Medicine

## 2019-10-06 ENCOUNTER — Ambulatory Visit (HOSPITAL_BASED_OUTPATIENT_CLINIC_OR_DEPARTMENT_OTHER)
Admission: RE | Admit: 2019-10-06 | Discharge: 2019-10-06 | Disposition: A | Discharge status: Home / Self Care | Payer: Medicare PPO | Source: Ambulatory Visit | Attending: Internal Medicine | Admitting: Internal Medicine

## 2019-10-06 ENCOUNTER — Other Ambulatory Visit: Payer: Self-pay

## 2019-10-06 ENCOUNTER — Ambulatory Visit (HOSPITAL_COMMUNITY)
Admission: RE | Admit: 2019-10-06 | Discharge: 2019-10-06 | Disposition: A | Discharge status: Home / Self Care | Payer: Medicare PPO | Source: Ambulatory Visit | Attending: Internal Medicine | Admitting: Internal Medicine

## 2019-10-06 VITALS — BP 120/74 | HR 68 | Wt 165.4 lb

## 2019-10-06 DIAGNOSIS — E785 Hyperlipidemia, unspecified: Secondary | ICD-10-CM | POA: Insufficient documentation

## 2019-10-06 DIAGNOSIS — G4733 Obstructive sleep apnea (adult) (pediatric): Secondary | ICD-10-CM | POA: Diagnosis not present

## 2019-10-06 DIAGNOSIS — Z01818 Encounter for other preprocedural examination: Secondary | ICD-10-CM | POA: Diagnosis not present

## 2019-10-06 DIAGNOSIS — Z803 Family history of malignant neoplasm of breast: Secondary | ICD-10-CM | POA: Diagnosis not present

## 2019-10-06 DIAGNOSIS — Z923 Personal history of irradiation: Secondary | ICD-10-CM | POA: Diagnosis not present

## 2019-10-06 DIAGNOSIS — Z7982 Long term (current) use of aspirin: Secondary | ICD-10-CM | POA: Diagnosis not present

## 2019-10-06 DIAGNOSIS — C50411 Malignant neoplasm of upper-outer quadrant of right female breast: Secondary | ICD-10-CM

## 2019-10-06 DIAGNOSIS — Z171 Estrogen receptor negative status [ER-]: Secondary | ICD-10-CM

## 2019-10-06 DIAGNOSIS — E669 Obesity, unspecified: Secondary | ICD-10-CM | POA: Diagnosis not present

## 2019-10-06 DIAGNOSIS — Z880 Allergy status to penicillin: Secondary | ICD-10-CM | POA: Insufficient documentation

## 2019-10-06 DIAGNOSIS — I10 Essential (primary) hypertension: Secondary | ICD-10-CM

## 2019-10-06 DIAGNOSIS — Z79899 Other long term (current) drug therapy: Secondary | ICD-10-CM | POA: Diagnosis not present

## 2019-10-06 DIAGNOSIS — Z01810 Encounter for preprocedural cardiovascular examination: Secondary | ICD-10-CM

## 2019-10-06 DIAGNOSIS — Z8249 Family history of ischemic heart disease and other diseases of the circulatory system: Secondary | ICD-10-CM | POA: Diagnosis not present

## 2019-10-06 NOTE — Addendum Note (Signed)
Encounter addended by: Scarlette Calico, RN on: 10/06/2019 2:42 PM  Actions taken: Clinical Note Signed

## 2019-10-06 NOTE — Progress Notes (Signed)
CARDIO-ONCOLOGY CLINIC CONSULT NOTE PCP: Dr. Iona Kelly Referring Physician: [name] Primary Cardiologist:  HPI: [name] is a 74 y.o. female with h/o obesity, recently-diagnosed HTN, OSA (cannot tolerate CPAP)and right breast cancer referred by Dr. Jana Kelly for enrollment into the Cardio-Oncology program.  Diagnosed with right breast cancer diagnosed 11/19  Breast Cancer course   (1) status post right lumpectomy and sentinel lymph node sampling 07/02/2018 for a pT1b pN0,stage IA invasive ductal carcinoma, grade 2, now HER-2 positive, still estrogen and progesterone receptor negative, with an MIB-1 of 1%  (2) adjuvant chemotherapy consisting of paclitaxel x12 with trastuzumab given weekly started 07/27/2018             (a) paclitaxel discontinued after 3 doses because of neuropathy-- last dose 08/09/2018  (3) trastuzumab to continue to total 1 year (through December 2020)    (3) adjuvant radiation completed on 10/15/2018   Finished Herceptin on 07/04/19 without problems. Doing well. Active. No SOB. No edema. Port-a-cath is out. Having some back pain and sciatica and pending low back fusion. BP much better controlled.   Echo today 10/06/19: EF 55-60% GLS -16.7% Personally reviewed  Echo 11/20 EF 50-55% GLS unchanged at 17.4%  ECHO 1/20 EF 55-60% ECHO 12/14/18 EF 50-55%   GLS -14% (underestimated) ECHO 03/03/19 EF 50-55%   GLS -17.3% .    Past Medical History:  Diagnosis Date  . Anemia   . Blood transfusion 2005  . Family history of breast cancer   . Family history of prostate cancer   . Hernia   . HH (hiatus hernia)   . Hyperlipidemia   . Hypertension   . PAH (pulmonary artery hypertension) ([place])   . Personal history of chemotherapy   . Personal history of radiation therapy   . Sarcoidosis   . Shortness of breath    on exertion  . Sleep apnea    does not use cpap  . Wheezing     Current Outpatient Medications  Medication Sig Dispense Refill  .  Ascorbic Acid (VITAMIN C) 1000 MG tablet Take 1,000 mg by mouth every other day.     Marland Kitchen aspirin-sod bicarb-citric acid (ALKA-SELTZER) 325 MG TBEF tablet Take 325 mg by mouth every 6 (six) hours as needed.    . Cholecalciferol (VITAMIN D) 2000 UNITS CAPS Take 2,000 Units by mouth daily.     . Coenzyme Q10 200 MG TABS Take 200 mg by mouth daily.    Marland Kitchen FLUOROMETHOLONE ACETATE OP Apply to eye every morning.    . hydrocortisone 2.5 % cream Apply 1 application topically 2 (two) times daily as needed (dry skin).    Marland Kitchen ibuprofen (ADVIL,MOTRIN) 200 MG tablet Take 400 mg by mouth every 6 (six) hours as needed for headache or moderate pain.    Marland Kitchen ketotifen (ALAWAY) 0.025 % ophthalmic solution Place 1 drop into both eyes 2 (two) times daily.    Marland Kitchen loperamide (IMODIUM A-D) 2 MG capsule Take 2 mg by mouth daily as needed for diarrhea or loose stools.    Marland Kitchen omeprazole (PRILOSEC) 20 MG capsule Take 20 mg by mouth daily.     . Polyvinyl Alcohol-Povidone PF (REFRESH) 1.4-0.6 % SOLN Place 1 drop into both eyes daily as needed (dry eyes).     . potassium gluconate 595 MG TABS Take 595 mg by mouth daily.     . simvastatin (ZOCOR) 20 MG tablet Take 20 mg by mouth every morning.     . triamterene-hydrochlorothiazide (MAXZIDE-25) 37.5-25 MG per tablet  Take 1 tablet by mouth every morning.      No current facility-administered medications for this encounter.    Allergies  Allergen Reactions  . Shellfish Allergy Other (See Comments)    Pain all over  . Penicillins Rash    Fine red bumps that spread all over the body. Has patient had a PCN reaction causing immediate rash, facial/tongue/throat swelling, SOB or lightheadedness with hypotension: No Has patient had a PCN reaction causing severe rash involving mucus membranes or skin necrosis: No Has patient had a PCN reaction that required hospitalization: No Has patient had a PCN reaction occurring within the last 10 years: No If all of the above answers are "NO", then may  proceed with Cephalosporin use.   . Skin Adhesives [Cyanoacrylate] Itching and Rash    steri strips      Social History   Socioeconomic History  . Marital status: Single    Spouse name: Not on file  . Number of children: Not on file  . Years of education: Not on file  . Highest education level: Not on file  Occupational History  . Occupation: Retired Pharmacist, hospital  Tobacco Use  . Smoking status: Never Smoker  . Smokeless tobacco: Never Used  Substance and Sexual Activity  . Alcohol use: No  . Drug use: No  . Sexual activity: Not on file  Other Topics Concern  . Not on file  Social History Narrative  . Not on file   Social Determinants of Health   Financial Resource Strain:   . Difficulty of Paying Living Expenses:   Food Insecurity:   . Worried About Charity fundraiser in the Last Year:   . Arboriculturist in the Last Year:   Transportation Needs:   . Film/video editor (Medical):   Marland Kitchen Lack of Transportation (Non-Medical):   Physical Activity:   . Days of Exercise per Week:   . Minutes of Exercise per Session:   Stress:   . Feeling of Stress :   Social Connections:   . Frequency of Communication with Friends and Family:   . Frequency of Social Gatherings with Friends and Family:   . Attends Religious Services:   . Active Member of Clubs or Organizations:   . Attends Archivist Meetings:   Marland Kitchen Marital Status:   Intimate Partner Violence:   . Fear of Current or Ex-Partner:   . Emotionally Abused:   Marland Kitchen Physically Abused:   . Sexually Abused:       Family History  Problem Relation Age of Onset  . Clotting disorder Mother        PE  . Heart disease Father   . Breast cancer Maternal Grandmother        spread to brain  . Coronary artery disease Other   . Prostate cancer Brother 81  . Breast cancer Maternal Aunt   . Cancer Maternal Aunt        unk type  . Prostate cancer Paternal Grandfather   . Prostate cancer Brother     Vitals:   10/06/19  1410  BP: 120/74  Pulse: 68  SpO2: 98%  Weight: 75 kg (165 lb 6.4 oz)    PHYSICAL EXAM: General:  Well appearing. No resp difficulty HEENT: normal Neck: supple. no JVD. Carotids 2+ bilat; no bruits. No lymphadenopathy or thryomegaly appreciated. Cor: PMI nondisplaced. Regular rate & rhythm. No rubs, gallops or murmurs. Lungs: clear Abdomen: soft, nontender, nondistended. No hepatosplenomegaly. No bruits or  masses. Good bowel sounds. Extremities: no cyanosis, clubbing, rash, edema Neuro: alert & orientedx3, cranial nerves grossly intact. moves all 4 extremities w/o difficulty. Affect pleasant   ASSESSMENT & PLAN:  1. Right Breast Cancer - diagnosed 11/19 ER/PR negative HER 2-neu + - adjuvant chemotherapy consisting of paclitaxel x12 with trastuzumab given weekly started 07/27/2018 paclitaxel discontinued after 3 doses because of neuropathy  - Now off Herceptin (finished 07/04/19) - ECHO 1/20 EF 55-60% - ECHO 12/14/18 EF 50-55%   GLS -14% (underestimated) - ECHO 03/03/19 EF 50-55%   GLS -17.3% . - ECHO 06/08/19  EF and GLS stable 50-55% GLS -17.4%  - ECHO today 10/06/19 EF 55-60% GLS -16.7% - No evidence of Herceptin cardiotoxicity. She can f/u as needed  2. HTN  - Blood pressure well controlled. Continue current regimen.  3. OSA - Sleep study 11/13 (AHI 17.6) - Previously seen by Dr. Annamaria Boots  - Unable to tolerate CPAP - No s/s of OSA clinically. No change.  4. Pre-op CV assessment - she is low risk for peri-op CV complications can proceed without further cardiac testing    Rachel Bickers, MD  2:31 PM

## 2019-10-06 NOTE — Patient Instructions (Signed)
Follow up as needed

## 2019-10-06 NOTE — Progress Notes (Signed)
  Echocardiogram 2D Echocardiogram has been performed.  Dillen Belmontes A Dazja Houchin 10/06/2019, 1:52 PM

## 2019-10-10 NOTE — Progress Notes (Signed)
Surgical clearance form completed by Dr Haroldine Laws and faxed to [facility] at [phone]

## 2019-10-10 NOTE — Addendum Note (Signed)
Encounter addended by: Scarlette Calico, RN on: 10/10/2019 3:28 PM  Actions taken: Clinical Note Signed

## 2019-10-17 NOTE — Progress Notes (Signed)
[place]  [phone] [phone]     ID: Rachel Kelly DOB: 04-Aug-1945  MR#: [id number]  WPY#:[id number]  Patient Care Team: [name], MD as PCP - General (Family Medicine) Deneise Lever, MD as Referring Physician (Pulmonary Disease) [name], MD (Inactive) as Consulting Physician (General Surgery) Elleah Hemsley, [name], MD as Consulting Physician (Oncology) Eppie Gibson, MD as Attending Physician (Radiation Oncology) Chelsea Aus, DDS as Consulting Physician (Dentistry) Harriett Sine, MD as Consulting Physician (Dermatology) Bensimhon, Shaune Pascal, MD as Consulting Physician (Cardiology) Mauro Kaufmann, RN as Oncology Nurse Navigator Rockwell Germany, RN as Oncology Nurse Navigator Starling Manns, MD (Orthopedic Surgery) OTHER MD:  CHIEF COMPLAINT: HER-2 positive, estrogen receptor negative breast cancer  CURRENT TREATMENT: observation   INTERVAL HISTORY: Rachel Kelly returns today for follow-up of her HER-2 positive breast cancer. She is now under observation.  Since her last visit, she had her port removed on 08/17/2019 under Dr. Marlou Starks.  She had no complications from this or other minimal rash which resolved without intervention  She also underwent repeat echocardiogram on 10/06/2019, which showed an ejection fraction of 55-60%.  She also underwent chest x-ray on 09/07/2019 as part of preoperative assessment, which showed: lungs clear; multiple calcified lymph nodes; no adenopathy.  Her most recent mammography at [facility] on 05/30/2019 was negative.   REVIEW OF SYSTEMS: Rachel Kelly has developed a formidable head of hair.  It is white in patches and Frosty in other areas and dark in other areas and it is she says finer than it used to be.  She has been stopped at least twice by people asking her where she got this haircut.  She continues to have significant back pain and is scheduled for laparoscopic surgery under Dr. Patrice Paradise she says.   Her nails are brittle.  The neuropathy in her feet she thinks is getting a little worse.  A detailed review of systems today was otherwise stable.   HISTORY OF CURRENT ILLNESS: From the original intake note:  Rachel Kelly had routine screening mammography on 05/26/2018 showing a possible abnormality in the right breast. She underwent bilateral diagnostic mammography with tomography and right breast ultrasonography at [facility] on 06/02/2018 showing: breast density category C, suspicious mass at the 10 o'clock position 8 cm from the nipple. It measures 10 x 7 x 6 mm. Just adjacent to this mass, there is an irregular hypoechoic mass with punctate echogenic foci. It measures 9 x 8 x 3 mm. Additional indeterminate mass at the 9 o'clock position 6 cm from the nipple. It measures 6 x 4 x 4 mm. No suspicious right axillary lymphadenopathy.  Accordingly on 06/02/2018 she proceeded to biopsy of two of the right breast areas in question. The pathology from this procedure showed [id number]): at the 10 o'clock position, invasive ductal carcinoma, grade 2, estrogen and progesterone receptor negative, HER-2 equivocal by immunohistochemistry at 2+, but negative by [facility] with a signals ratio of 1.47 and number per cell 2.20.  Biopsy of the right breast lesion at the 9 o'clock position showed a sclerotic lesion with calcifications and negative for carcinoma.  The patient's subsequent history is as detailed below.   PAST MEDICAL HISTORY: Past Medical History:  Diagnosis Date  . Anemia   . Blood transfusion 2005  . Family history of breast cancer   . Family history of prostate cancer   . Hernia   . HH (hiatus hernia)   . Hyperlipidemia   .  Hypertension   . PAH (pulmonary artery hypertension) ([place])   . Personal history of chemotherapy   . Personal history of radiation therapy   . Sarcoidosis   . Shortness of breath    on exertion  . Sleep apnea    does not use cpap  . Wheezing     PAST  SURGICAL HISTORY: Past Surgical History:  Procedure Laterality Date  . ABDOMINAL HYSTERECTOMY  1988  . BACK SURGERY  2005   lower  . BREAST LUMPECTOMY WITH RADIOACTIVE SEED AND SENTINEL LYMPH NODE BIOPSY Right 07/02/2018   Procedure: RIGHT BREAST RADIOACTIVE SEED LUMPECTOMY X2 AND RIGHT AXILLARY  SENTINEL LYMPH NODE BIOPSY;  Surgeon: [name], MD;  Location: [facility];  Service: General;  Laterality: Right;  . ESOPHAGOGASTRODUODENOSCOPY  06/24/2012   Procedure: ESOPHAGOGASTRODUODENOSCOPY (EGD);  Surgeon: [name], MD;  Location: [name];  Service: General;  Laterality: N/A;  . HIATAL HERNIA REPAIR  08/18/2012   Procedure: LAPAROSCOPIC REPAIR OF HIATAL HERNIA;  Surgeon: [name], MD;  Location: [facility];  Service: General;  Laterality: N/A;  Laparoscopic Hiatal Hernia Repair   . INSERTION OF MESH  08/18/2012   Procedure: INSERTION OF MESH;  Surgeon: [name], MD;  Location: [facility];  Service: General;;  . IR IMAGING GUIDED PORT INSERTION  07/19/2018  . PORT-A-CATH REMOVAL Right 08/17/2019   Procedure: REMOVAL PORT-A-CATH;  Surgeon: Jovita Kussmaul, MD;  Location: [facility];  Service: General;  Laterality: Right;  . PORTACATH PLACEMENT N/A 07/02/2018   Procedure: INSERTION PORT-A-CATH TRIED NO PORT IN PLACE;  Surgeon: [name], MD;  Location: [facility];  Service: General;  Laterality: N/A;  . VESICOVAGINAL FISTULA CLOSURE W/ TAH      FAMILY HISTORY Family History  Problem Relation Age of Onset  . Clotting disorder Mother        PE  . Heart disease Father   . Breast cancer Maternal Grandmother        spread to brain  . Coronary artery disease Other   . Prostate cancer Brother 15  . Breast cancer Maternal Aunt   . Cancer Maternal Aunt        unk type  . Prostate cancer Paternal Grandfather   . Prostate cancer Brother    She notes that her father died from heart disease at age 30. Patients' mother lives in a nursing home and is 22  (as of November 2019)-- she has a form of dementia. The patient has 3 brothers and 0 sisters. A maternal aunt had a form of cancer, not clear what it was.  The maternal grandmother had breast cancer, but was never treated because of her old age.  The patient does not think anyone in her family had ovarian cancer, but there are 2 others in her family that had colon cancer.    GYNECOLOGIC HISTORY:  No LMP recorded. Patient has had a hysterectomy. 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  She lives alone. She does not have any pets.  Her daughter Deatra Canter "Cantrece" Grandville Silos, 48, lives in [place] and works in Psychologist, educational. No grandchildren.  The patient belongs to [place]: In place, but she  can't remember who her power of attorney is (believes it is her daughter).   HEALTH MAINTENANCE: Social History   Tobacco Use  . Smoking status: Never Smoker  . Smokeless tobacco: Never Used  Substance Use Topics  . Alcohol use: No  . Drug use: No    Colonoscopy: Dr. Collene Mares  PAP: yes, but not sure when  Bone density: No   Allergies  Allergen Reactions  . Shellfish Allergy Other (See Comments)    Pain all over  . Penicillins Rash    Fine red bumps that spread all over the body. Has patient had a PCN reaction causing immediate rash, facial/tongue/throat swelling, SOB or lightheadedness with hypotension: No Has patient had a PCN reaction causing severe rash involving mucus membranes or skin necrosis: No Has patient had a PCN reaction that required hospitalization: No Has patient had a PCN reaction occurring within the last 10 years: No If all of the above answers are "NO", then may proceed with Cephalosporin use.    . Skin Adhesives [Cyanoacrylate] Itching and Rash    steri strips    Current Outpatient Medications  Medication Sig Dispense Refill  . Ascorbic Acid (VITAMIN C) 1000 MG tablet Take 1,000 mg by mouth every other day.     Marland Kitchen aspirin-sod bicarb-citric acid (ALKA-SELTZER) 325 MG TBEF tablet Take 325 mg by mouth every 6 (six) hours as needed.    . Cholecalciferol (VITAMIN D) 2000 UNITS CAPS Take 2,000 Units by mouth daily.     . Coenzyme Q10 200 MG TABS Take 200 mg by mouth daily.    Marland Kitchen FLUOROMETHOLONE ACETATE OP Apply to eye every morning.    . gabapentin (NEURONTIN) 100 MG capsule Take 1 capsule (100 mg total) by mouth at bedtime. 90 capsule 4  . hydrocortisone 2.5 % cream Apply 1 application topically 2 (two) times daily as needed (dry skin).    Marland Kitchen ibuprofen (ADVIL,MOTRIN) 200 MG tablet Take 400 mg by mouth every 6 (six) hours as needed for headache or moderate pain.    Marland Kitchen ketotifen (ALAWAY) 0.025 % ophthalmic solution Place 1 drop into both eyes 2 (two) times daily.    Marland Kitchen loperamide (IMODIUM A-D) 2 MG capsule Take 2 mg by mouth daily as needed for diarrhea or loose stools.    Marland Kitchen omeprazole (PRILOSEC) 20 MG capsule Take 20 mg by mouth daily.     . Polyvinyl Alcohol-Povidone PF (REFRESH) 1.4-0.6 % SOLN Place 1 drop into both eyes daily as needed (dry eyes).     . potassium gluconate 595 MG TABS Take 595 mg by mouth daily.     . simvastatin (ZOCOR) 20 MG tablet Take 20 mg by mouth every morning.     . triamterene-hydrochlorothiazide (MAXZIDE-25) 37.5-25 MG per tablet Take 1 tablet by mouth every morning.      No current facility-administered medications for this visit.    OBJECTIVE: African-American woman who appears stated age  31:   10/18/19 1344  BP: 110/74  Pulse: 72  Resp: 18  Temp: 98.7 F (37.1 C)  SpO2: 100%   Wt Readings from Last 3 Encounters:  10/18/19 166 lb 1.6 oz (75.3 kg)  10/06/19 165 lb 6.4 oz (75 kg)  09/07/19 165 lb (74.8 kg)   Body mass index is 33.55 kg/m.     ECOG FS:1 - Symptomatic but completely ambulatory  Sclerae unicteric, EOMs intact Wearing a mask No cervical or supraclavicular adenopathy Lungs no rales or rhonchi Heart regular rate and rhythm Abd soft, obese,  nontender, positive bowel sounds MSK no focal spinal tenderness, no upper extremity lymphedema Neuro: nonfocal, well oriented, appropriate affect Breasts: The right breast has undergone lumpectomy and radiation.  The cosmetic result is very good.  There is minimal residual hyperpigmentation.  There is no evidence of local recurrence.  The left breast is benign.  Both axillae are benign.   LAB RESULTS:  CMP     Component Value Date/Time   NA 140 10/18/2019 1256   K 4.1 10/18/2019 1256   CL 105 10/18/2019 1256   CO2 26 10/18/2019 1256   GLUCOSE 105 (H) 10/18/2019 1256   BUN 21 10/18/2019 1256   CREATININE 1.24 (H) 10/18/2019 1256   CREATININE 1.18 (H) 01/17/2019 1305   CALCIUM 9.2 10/18/2019 1256   PROT 7.6 10/18/2019 1256   ALBUMIN 3.9 10/18/2019 1256   AST 27 10/18/2019 1256   AST 24 01/17/2019 1305   ALT 20 10/18/2019 1256   ALT 17 01/17/2019 1305   ALKPHOS 73 10/18/2019 1256   BILITOT 0.7 10/18/2019 1256   BILITOT 0.3 01/17/2019 1305   GFRNONAA 42 (L) 10/18/2019 1256   GFRNONAA 45 (L) 01/17/2019 1305   GFRAA 49 (L) 10/18/2019 1256   GFRAA 53 (L) 01/17/2019 1305    No results found for: TOTALPROTELP, ALBUMINELP, A1GS, A2GS, BETS, BETA2SER, GAMS, MSPIKE, SPEI  No results found for: KPAFRELGTCHN, LAMBDASER, KAPLAMBRATIO  Lab Results  Component Value Date   WBC 5.1 10/18/2019   NEUTROABS 3.0 10/18/2019   HGB 12.4 10/18/2019   HCT 37.8 10/18/2019   MCV 92.9 10/18/2019   PLT 160 10/18/2019    No results found for: LABCA2  No components found for: VELFYB017  No results for input(s): INR in the last 168 hours.  No results found for: LABCA2  No results found for: PZW258  No results found for: NID782  No results found for: UMP536  No results  found for: CA2729  No components found for: HGQUANT  No results found for: CEA1 / No results found for: CEA1   No results found for: AFPTUMOR  No results found for: CHROMOGRNA  No results found for: HGBA, HGBA2QUANT, HGBFQUANT, HGBSQUAN (Hemoglobinopathy evaluation)   No results found for: LDH  No results found for: IRON, TIBC, IRONPCTSAT (Iron and TIBC)  No results found for: FERRITIN  Urinalysis    Component Value Date/Time   COLORURINE STRAW (A) 07/27/2018 1050   APPEARANCEUR CLEAR 07/27/2018 1050   LABSPEC 1.020 08/05/2019 1155   PHURINE 6.0 08/05/2019 1155   GLUCOSEU NEGATIVE 08/05/2019 1155   HGBUR MODERATE (A) 08/05/2019 1155   BILIRUBINUR NEGATIVE 08/05/2019 1155   KETONESUR NEGATIVE 08/05/2019 1155   PROTEINUR 30 (A) 08/05/2019 1155   UROBILINOGEN 0.2 08/05/2019 1155   NITRITE POSITIVE (A) 08/05/2019 1155   LEUKOCYTESUR LARGE (A) 08/05/2019 1155     STUDIES:  ECHOCARDIOGRAM COMPLETE  Result Date: 10/06/2019    ECHOCARDIOGRAM REPORT   Patient Name:   MERIKAY LESNIEWSKI Date of Exam: 10/06/2019 Medical Rec #:  [id number]      Height:       59.0 in Accession #:    [id number]     Weight:       165.0 lb Date of Birth:  21-Jul-1946      BSA:          1.700 m Patient Age:    74 years       BP:           130/76 mmHg Patient Gender: F  HR:           76 bpm. Exam Location:  Outpatient Procedure: 2D Echo Indications:    Chemo V67.2 / Z09  History:        Patient has prior history of Echocardiogram examinations, most                 recent 06/08/2019. Signs/Symptoms:Dyspnea; Risk                 Factors:Hypertension, Dyslipidemia and Sleep Apnea.  Sonographer:    Vikki Ports Turrentine Referring Phys: [id number] DANIEL R BENSIMHON IMPRESSIONS  1. Left ventricular ejection fraction, by estimation, is 55 to 60%. The left ventricle has normal function. The left ventricle has no regional wall motion abnormalities. Left ventricular diastolic parameters are consistent with Grade I  diastolic dysfunction (impaired relaxation). The average left ventricular global longitudinal strain is -16.7 %.  2. Right ventricular systolic function is normal. The right ventricular size is normal. There is normal pulmonary artery systolic pressure.  3. Left atrial size was mildly dilated.  4. Right atrial size was mildly dilated.  5. The mitral valve is normal in structure. Trivial mitral valve regurgitation.  6. The aortic valve is normal in structure. Aortic valve regurgitation is not visualized. Mild aortic valve sclerosis is present, with no evidence of aortic valve stenosis. FINDINGS  Left Ventricle: Left ventricular ejection fraction, by estimation, is 55 to 60%. The left ventricle has normal function. The left ventricle has no regional wall motion abnormalities. The average left ventricular global longitudinal strain is -16.7 %. The left ventricular internal cavity size was normal in size. There is no left ventricular hypertrophy. Left ventricular diastolic parameters are consistent with Grade I diastolic dysfunction (impaired relaxation). Right Ventricle: The right ventricular size is normal. No increase in right ventricular wall thickness. Right ventricular systolic function is normal. There is normal pulmonary artery systolic pressure. The tricuspid regurgitant velocity is 2.58 m/s, and  with an assumed right atrial pressure of 3 mmHg, the estimated right ventricular systolic pressure is 59.5 mmHg. Left Atrium: Left atrial size was mildly dilated. Right Atrium: Right atrial size was mildly dilated. Pericardium: There is no evidence of pericardial effusion. Mitral Valve: The mitral valve is normal in structure. There is mild calcification of the mitral valve leaflet(s). Trivial mitral valve regurgitation. Tricuspid Valve: The tricuspid valve is normal in structure. Tricuspid valve regurgitation is mild. Aortic Valve: The aortic valve is normal in structure. Aortic valve regurgitation is not visualized.  Mild aortic valve sclerosis is present, with no evidence of aortic valve stenosis. There is mild calcification of the aortic valve. Aortic valve mean gradient measures 6.0 mmHg. Aortic valve peak gradient measures 10.6 mmHg. Aortic valve area, by VTI measures 1.30 cm. Pulmonic Valve: The pulmonic valve was normal in structure. Pulmonic valve regurgitation is trivial. Aorta: The aortic root and ascending aorta are structurally normal, with no evidence of dilitation. IAS/Shunts: No atrial level shunt detected by color flow Doppler.  LEFT VENTRICLE PLAX 2D LVIDd:         4.20 cm  Diastology LVIDs:         2.90 cm  LV e' lateral:   10.80 cm/s LV PW:         0.90 cm  LV E/e' lateral: 4.5 LV IVS:        0.90 cm  LV e' medial:    5.70 cm/s LVOT diam:     1.70 cm  LV E/e' medial:  8.5 LV SV:  44 LV SV Index:   26       2D Longitudinal Strain LVOT Area:     2.27 cm 2D Strain GLS Avg:     -16.7 %  RIGHT VENTRICLE RV S prime:     9.55 cm/s TAPSE (M-mode): 1.6 cm LEFT ATRIUM             Index       RIGHT ATRIUM           Index LA diam:        3.90 cm 2.29 cm/m  RA Area:     15.20 cm LA Vol (A2C):   44.5 ml 26.18 ml/m RA Volume:   37.00 ml  21.77 ml/m LA Vol (A4C):   38.0 ml 22.36 ml/m LA Biplane Vol: 42.6 ml 25.07 ml/m  AORTIC VALVE AV Area (Vmax):    1.35 cm AV Area (Vmean):   1.36 cm AV Area (VTI):     1.30 cm AV Vmax:           163.00 cm/s AV Vmean:          117.000 cm/s AV VTI:            0.339 m AV Peak Grad:      10.6 mmHg AV Mean Grad:      6.0 mmHg LVOT Vmax:         97.30 cm/s LVOT Vmean:        70.300 cm/s LVOT VTI:          0.194 m LVOT/AV VTI ratio: 0.57  AORTA Ao Root diam: 2.60 cm MITRAL VALVE               TRICUSPID VALVE MV Area (PHT): 1.98 cm    TR Peak grad:   26.6 mmHg MV Decel Time: 384 msec    TR Vmax:        258.00 cm/s MV E velocity: 48.40 cm/s MV A velocity: 64.70 cm/s  SHUNTS MV E/A ratio:  0.75        Systemic VTI:  0.19 m                            Systemic Diam: 1.70 cm Glori Bickers MD Electronically signed by Glori Bickers MD Signature Date/Time: 10/06/2019/2:29:28 PM    Final      ELIGIBLE FOR AVAILABLE RESEARCH PROTOCOL: PREVENT  ASSESSMENT: 74 y.o. [place] woman status post right breast upper outer quadrant biopsy 06/02/2018 for a clinical T1b N0, stage IB invasive ductal carcinoma, grade 2, triple negative, with an MIB-1 of 10%  (1) status post right lumpectomy and sentinel lymph node sampling 07/02/2018 for a pT1b pN0,stage IA invasive ductal carcinoma, grade 2, now HER-2 positive, still estrogen and progesterone receptor negative, with an MIB-1 of 1%  (2) adjuvant chemotherapy consisting of paclitaxel x12 with trastuzumab given weekly started 07/27/2018  (a) paclitaxel discontinued after 3 doses because of neuropathy-- last dose 08/09/2018  (3) trastuzumab continued to total 1 year (last dose 07/04/2019)  (a) echocardiogram 07/22/2018 showed an ejection fraction in the 55-60% range.  (b) Echocardiogram on 12/14/2018 shows EF of 55-60%  (c) echo 03/03/2019 shows ejection fraction in the 50-55% range  (d) echo 06/08/2019 shows an ejection fraction in the 50-55% range  (3) adjuvant radiation 09/20/2018 - 10/15/2018  (a) right breast / 40.05 Gy in 15 fractions  (b) boost / 10 Gy in 5 fractions  (4) genetics testing 06/01/2019  through the [facility] Common Hereditary Cancers Panel found no deleterious mutations in APC, ATM, AXIN2, BARD1, BMPR1A, BRCA1, BRCA2, BRIP1, CDH1, CDKN2A (p14ARF), CDKN2A (p16INK4a), CKD4, CHEK2, CTNNA1, DICER1, EPCAM (Deletion/duplication testing only), GREM1 (promoter region deletion/duplication testing only), KIT, MEN1, MLH1, MSH2, MSH3, MSH6, MUTYH, NBN, NF1, NHTL1, PALB2, PDGFRA, PMS2, POLD1, POLE, PTEN, RAD50, RAD51C, RAD51D, RNF43, SDHB, SDHC, SDHD, SMAD4, SMARCA4. STK11, TP53, TSC1, TSC2, and VHL.  The following genes were evaluated for sequence changes only: SDHA and HOXB13 c.251G>A variant only. The report date is  06/01/2019.   PLAN: Rachel Kelly is now a year and a half out from definitive surgery for her breast cancer with no evidence of disease recurrence.  This is very favorable.  She is seeing multiple physicians including her primary care doctor and her breast surgeon.  She is going to be following up with her back surgeon and also plans to see her gynecologist.  I think I can start seeing her on a once a year basis at this point and she is agreeable to that.  I suggested she try biotin for her nails.  She is also going to try gabapentin at bedtime to see if that helps with the neuropathy.  She is aware that this can make her sleepy so she should not be taking it during the day  She knows to call for any other issue that may develop before the next visit  Total encounter time 25 minutes.*  Peter Keyworth, [name], MD  10/18/19 2:36 PM Medical Oncology and Hematology [facility] [address] [place], [place] [address] Tel. [phone]    Fax. [phone]   I, Wilburn Mylar, am acting as scribe for Dr. [name]. Chigozie Basaldua.  I, Lurline Del MD, have reviewed the above documentation for accuracy and completeness, and I agree with the above.   *Total Encounter Time as defined by the Centers for Medicare and Medicaid Services includes, in addition to the face-to-face time of a patient visit (documented in the note above) non-face-to-face time: obtaining and reviewing outside history, ordering and reviewing medications, tests or procedures, care coordination (communications with other health care professionals or caregivers) and documentation in the medical record.

## 2019-10-18 ENCOUNTER — Inpatient Hospital Stay: Payer: Medicare PPO | Attending: Oncology | Admitting: Oncology

## 2019-10-18 ENCOUNTER — Other Ambulatory Visit: Payer: Self-pay

## 2019-10-18 ENCOUNTER — Telehealth: Payer: Self-pay | Admitting: Oncology

## 2019-10-18 ENCOUNTER — Inpatient Hospital Stay: Payer: Medicare PPO

## 2019-10-18 VITALS — BP 110/74 | HR 72 | Temp 98.7°F | Resp 18 | Ht 59.0 in | Wt 166.1 lb

## 2019-10-18 DIAGNOSIS — I10 Essential (primary) hypertension: Secondary | ICD-10-CM | POA: Insufficient documentation

## 2019-10-18 DIAGNOSIS — C50411 Malignant neoplasm of upper-outer quadrant of right female breast: Secondary | ICD-10-CM | POA: Diagnosis not present

## 2019-10-18 DIAGNOSIS — G4733 Obstructive sleep apnea (adult) (pediatric): Secondary | ICD-10-CM

## 2019-10-18 DIAGNOSIS — Z79899 Other long term (current) drug therapy: Secondary | ICD-10-CM | POA: Insufficient documentation

## 2019-10-18 DIAGNOSIS — D869 Sarcoidosis, unspecified: Secondary | ICD-10-CM

## 2019-10-18 DIAGNOSIS — G629 Polyneuropathy, unspecified: Secondary | ICD-10-CM | POA: Diagnosis not present

## 2019-10-18 DIAGNOSIS — Z9221 Personal history of antineoplastic chemotherapy: Secondary | ICD-10-CM | POA: Diagnosis not present

## 2019-10-18 DIAGNOSIS — Z171 Estrogen receptor negative status [ER-]: Secondary | ICD-10-CM

## 2019-10-18 DIAGNOSIS — Z923 Personal history of irradiation: Secondary | ICD-10-CM | POA: Insufficient documentation

## 2019-10-18 DIAGNOSIS — Z853 Personal history of malignant neoplasm of breast: Secondary | ICD-10-CM | POA: Insufficient documentation

## 2019-10-18 LAB — COMPREHENSIVE METABOLIC PANEL
ALT: 20 U/L (ref 0–44)
AST: 27 U/L (ref 15–41)
Albumin: 3.9 g/dL (ref 3.5–5.0)
Alkaline Phosphatase: 73 U/L (ref 38–126)
Anion gap: 9 (ref 5–15)
BUN: 21 mg/dL (ref 8–23)
CO2: 26 mmol/L (ref 22–32)
Calcium: 9.2 mg/dL (ref 8.9–10.3)
Chloride: 105 mmol/L (ref 98–111)
Creatinine, Ser: 1.24 mg/dL — ABNORMAL HIGH (ref 0.44–1.00)
GFR calc Af Amer: 49 mL/min — ABNORMAL LOW (ref >60)
GFR calc non Af Amer: 42 mL/min — ABNORMAL LOW (ref >60)
Glucose, Bld: 105 mg/dL — ABNORMAL HIGH (ref 70–99)
Potassium: 4.1 mmol/L (ref 3.5–5.1)
Sodium: 140 mmol/L (ref 135–145)
Total Bilirubin: 0.7 mg/dL (ref 0.3–1.2)
Total Protein: 7.6 g/dL (ref 6.5–8.1)

## 2019-10-18 LAB — CBC WITH DIFFERENTIAL/PLATELET
Abs Immature Granulocytes: 0.02 10*3/uL (ref 0.00–0.07)
Basophils Absolute: 0 10*3/uL (ref 0.0–0.1)
Basophils Relative: 0 %
Eosinophils Absolute: 0.1 10*3/uL (ref 0.0–0.5)
Eosinophils Relative: 1 %
HCT: 37.8 % (ref 36.0–46.0)
Hemoglobin: 12.4 g/dL (ref 12.0–15.0)
Immature Granulocytes: 0 %
Lymphocytes Relative: 30 %
Lymphs Abs: 1.5 10*3/uL (ref 0.7–4.0)
MCH: 30.5 pg (ref 26.0–34.0)
MCHC: 32.8 g/dL (ref 30.0–36.0)
MCV: 92.9 fL (ref 80.0–100.0)
Monocytes Absolute: 0.5 10*3/uL (ref 0.1–1.0)
Monocytes Relative: 9 %
Neutro Abs: 3 10*3/uL (ref 1.7–7.7)
Neutrophils Relative %: 60 %
Platelets: 160 10*3/uL (ref 150–400)
RBC: 4.07 MIL/uL (ref 3.87–5.11)
RDW: 12.9 % (ref 11.5–15.5)
WBC: 5.1 10*3/uL (ref 4.0–10.5)
nRBC: 0 % (ref 0.0–0.2)

## 2019-10-18 MED ORDER — GABAPENTIN 100 MG PO CAPS: 100.0000 mg | ORAL_CAPSULE | Freq: Every day | ORAL | 4 refills | Status: DC

## 2019-10-18 NOTE — Telephone Encounter (Signed)
Scheduled appt per 3/30 los. Pt confirmed new appt date and time.

## 2019-10-24 DIAGNOSIS — J301 Allergic rhinitis due to pollen: Secondary | ICD-10-CM | POA: Diagnosis not present

## 2019-10-26 ENCOUNTER — Other Ambulatory Visit: Payer: Self-pay | Admitting: Oncology

## 2019-10-26 NOTE — Progress Notes (Signed)
5:15 PM 10/26/2019  To whom it may concern:   I follow Rachel Kelly for Breast Cancer, which is now in remission. From an oncology point of view there are no contraindications to surgery. I am detailing her history below    74 y.o. [place] woman status post right breast upper outer quadrant biopsy 06/02/2018 for a clinical T1b N0, stage IB invasive ductal carcinoma, grade 2, triple negative, with an MIB-1 of 10%  (1) status post right lumpectomy and sentinel lymph node sampling 07/02/2018 for a pT1b pN0,stage IA invasive ductal carcinoma, grade 2, now HER-2 positive, still estrogen and progesterone receptor negative, with an MIB-1 of 1%  (2) adjuvant chemotherapy consisting of paclitaxel x12 with trastuzumab given weekly started 07/27/2018             (a) paclitaxel discontinued after 3 doses because of neuropathy-- last dose 08/09/2018  (3) trastuzumab continued to total 1 year (last dose 07/04/2019)             (a) echocardiogram 07/22/2018 showed an ejection fraction in the 55-60% range.             (b) Echocardiogram on 12/14/2018 shows EF of 55-60%             (c) echo 03/03/2019 shows ejection fraction in the 50-55% range             (d) echo 06/08/2019 shows an ejection fraction in the 50-55% range  (3) adjuvant radiation 09/20/2018 - 10/15/2018             (a) right breast / 40.05 Gy in 15 fractions             (b) boost / 10 Gy in 5 fractions  (4) genetics testing 06/01/2019 through the [facility] Common Hereditary Cancers Panel found no deleterious mutations in APC, ATM, AXIN2, BARD1, BMPR1A, BRCA1, BRCA2, BRIP1, CDH1, CDKN2A (p14ARF), CDKN2A (p16INK4a), CKD4, CHEK2, CTNNA1, DICER1, EPCAM (Deletion/duplication testing only), GREM1 (promoter region deletion/duplication testing only), KIT, MEN1, MLH1, MSH2, MSH3, MSH6, MUTYH, NBN, NF1, NHTL1, PALB2, PDGFRA, PMS2, POLD1, POLE, PTEN, RAD50, RAD51C, RAD51D, RNF43, SDHB, SDHC, SDHD, SMAD4, SMARCA4. STK11, TP53, TSC1, TSC2, and VHL. The  following genes were evaluated for sequence changes only: SDHA and HOXB13 c.251G>A variant only.The report date is 06/01/2019.     Rachel Del MD

## 2019-11-25 DIAGNOSIS — Z4689 Encounter for fitting and adjustment of other specified devices: Secondary | ICD-10-CM | POA: Diagnosis not present

## 2019-11-25 DIAGNOSIS — I10 Essential (primary) hypertension: Secondary | ICD-10-CM | POA: Diagnosis not present

## 2019-11-25 DIAGNOSIS — M4326 Fusion of spine, lumbar region: Secondary | ICD-10-CM | POA: Diagnosis not present

## 2019-11-25 DIAGNOSIS — Z6833 Body mass index (BMI) 33.0-33.9, adult: Secondary | ICD-10-CM | POA: Diagnosis not present

## 2019-11-25 DIAGNOSIS — M4316 Spondylolisthesis, lumbar region: Secondary | ICD-10-CM | POA: Diagnosis not present

## 2019-11-29 DIAGNOSIS — I10 Essential (primary) hypertension: Secondary | ICD-10-CM | POA: Diagnosis not present

## 2019-11-29 DIAGNOSIS — G4730 Sleep apnea, unspecified: Secondary | ICD-10-CM | POA: Diagnosis not present

## 2019-11-29 DIAGNOSIS — M48062 Spinal stenosis, lumbar region with neurogenic claudication: Secondary | ICD-10-CM | POA: Diagnosis not present

## 2019-11-29 DIAGNOSIS — M4316 Spondylolisthesis, lumbar region: Secondary | ICD-10-CM | POA: Diagnosis not present

## 2019-11-29 DIAGNOSIS — M4716 Other spondylosis with myelopathy, lumbar region: Secondary | ICD-10-CM | POA: Diagnosis not present

## 2019-11-29 DIAGNOSIS — R8290 Unspecified abnormal findings in urine: Secondary | ICD-10-CM | POA: Diagnosis not present

## 2019-11-29 DIAGNOSIS — Z01812 Encounter for preprocedural laboratory examination: Secondary | ICD-10-CM | POA: Diagnosis not present

## 2019-11-29 DIAGNOSIS — M4326 Fusion of spine, lumbar region: Secondary | ICD-10-CM | POA: Diagnosis not present

## 2019-11-29 DIAGNOSIS — R0602 Shortness of breath: Secondary | ICD-10-CM | POA: Diagnosis not present

## 2019-11-30 ENCOUNTER — Telehealth (HOSPITAL_COMMUNITY): Payer: Self-pay

## 2019-11-30 NOTE — Telephone Encounter (Signed)
EKG faxed to [facility] per request as patient having upcoming surgery

## 2019-12-06 DIAGNOSIS — Z789 Other specified health status: Secondary | ICD-10-CM | POA: Diagnosis not present

## 2019-12-06 DIAGNOSIS — M5116 Intervertebral disc disorders with radiculopathy, lumbar region: Secondary | ICD-10-CM | POA: Diagnosis not present

## 2019-12-06 DIAGNOSIS — M4326 Fusion of spine, lumbar region: Secondary | ICD-10-CM | POA: Diagnosis not present

## 2019-12-06 DIAGNOSIS — M545 Low back pain: Secondary | ICD-10-CM | POA: Diagnosis not present

## 2019-12-06 DIAGNOSIS — M48062 Spinal stenosis, lumbar region with neurogenic claudication: Secondary | ICD-10-CM | POA: Diagnosis not present

## 2019-12-06 DIAGNOSIS — M4316 Spondylolisthesis, lumbar region: Secondary | ICD-10-CM | POA: Diagnosis not present

## 2019-12-06 DIAGNOSIS — Z981 Arthrodesis status: Secondary | ICD-10-CM | POA: Diagnosis not present

## 2019-12-06 DIAGNOSIS — Z9889 Other specified postprocedural states: Secondary | ICD-10-CM | POA: Diagnosis not present

## 2019-12-06 DIAGNOSIS — M4716 Other spondylosis with myelopathy, lumbar region: Secondary | ICD-10-CM | POA: Diagnosis not present

## 2019-12-06 DIAGNOSIS — M48061 Spinal stenosis, lumbar region without neurogenic claudication: Secondary | ICD-10-CM | POA: Diagnosis not present

## 2019-12-06 DIAGNOSIS — Z7409 Other reduced mobility: Secondary | ICD-10-CM | POA: Diagnosis not present

## 2019-12-07 DIAGNOSIS — Z7409 Other reduced mobility: Secondary | ICD-10-CM | POA: Diagnosis not present

## 2019-12-07 DIAGNOSIS — M4186 Other forms of scoliosis, lumbar region: Secondary | ICD-10-CM | POA: Diagnosis not present

## 2019-12-07 DIAGNOSIS — M545 Low back pain: Secondary | ICD-10-CM | POA: Diagnosis not present

## 2019-12-07 DIAGNOSIS — M48061 Spinal stenosis, lumbar region without neurogenic claudication: Secondary | ICD-10-CM | POA: Diagnosis not present

## 2019-12-07 DIAGNOSIS — M5116 Intervertebral disc disorders with radiculopathy, lumbar region: Secondary | ICD-10-CM | POA: Diagnosis not present

## 2019-12-07 DIAGNOSIS — Z789 Other specified health status: Secondary | ICD-10-CM | POA: Diagnosis not present

## 2019-12-07 DIAGNOSIS — Z9889 Other specified postprocedural states: Secondary | ICD-10-CM | POA: Diagnosis not present

## 2019-12-26 DIAGNOSIS — H04123 Dry eye syndrome of bilateral lacrimal glands: Secondary | ICD-10-CM | POA: Diagnosis not present

## 2019-12-26 DIAGNOSIS — H402231 Chronic angle-closure glaucoma, bilateral, mild stage: Secondary | ICD-10-CM | POA: Diagnosis not present

## 2019-12-30 ENCOUNTER — Encounter (HOSPITAL_COMMUNITY): Payer: Self-pay

## 2019-12-30 ENCOUNTER — Other Ambulatory Visit: Payer: Self-pay

## 2019-12-30 ENCOUNTER — Ambulatory Visit (HOSPITAL_COMMUNITY)
Admission: EM | Admit: 2019-12-30 | Discharge: 2019-12-30 | Disposition: A | Discharge status: Home / Self Care | Payer: Medicare PPO | Attending: Internal Medicine | Admitting: Internal Medicine

## 2019-12-30 DIAGNOSIS — K529 Noninfective gastroenteritis and colitis, unspecified: Secondary | ICD-10-CM | POA: Insufficient documentation

## 2019-12-30 LAB — COMPREHENSIVE METABOLIC PANEL
ALT: 16 U/L (ref 0–44)
AST: 27 U/L (ref 15–41)
Albumin: 4.2 g/dL (ref 3.5–5.0)
Alkaline Phosphatase: 102 U/L (ref 38–126)
Anion gap: 14 (ref 5–15)
BUN: 22 mg/dL (ref 8–23)
CO2: 25 mmol/L (ref 22–32)
Calcium: 10.1 mg/dL (ref 8.9–10.3)
Chloride: 100 mmol/L (ref 98–111)
Creatinine, Ser: 1.47 mg/dL — ABNORMAL HIGH (ref 0.44–1.00)
GFR calc Af Amer: 40 mL/min — ABNORMAL LOW (ref >60)
GFR calc non Af Amer: 35 mL/min — ABNORMAL LOW (ref >60)
Glucose, Bld: 114 mg/dL — ABNORMAL HIGH (ref 70–99)
Potassium: 4.5 mmol/L (ref 3.5–5.1)
Sodium: 139 mmol/L (ref 135–145)
Total Bilirubin: 1.2 mg/dL (ref 0.3–1.2)
Total Protein: 8.2 g/dL — ABNORMAL HIGH (ref 6.5–8.1)

## 2019-12-30 LAB — CBC WITH DIFFERENTIAL/PLATELET
Abs Immature Granulocytes: 0.03 10*3/uL (ref 0.00–0.07)
Basophils Absolute: 0 10*3/uL (ref 0.0–0.1)
Basophils Relative: 0 %
Eosinophils Absolute: 0 10*3/uL (ref 0.0–0.5)
Eosinophils Relative: 0 %
HCT: 40.9 % (ref 36.0–46.0)
Hemoglobin: 13.5 g/dL (ref 12.0–15.0)
Immature Granulocytes: 0 %
Lymphocytes Relative: 12 %
Lymphs Abs: 1.1 10*3/uL (ref 0.7–4.0)
MCH: 30.4 pg (ref 26.0–34.0)
MCHC: 33 g/dL (ref 30.0–36.0)
MCV: 92.1 fL (ref 80.0–100.0)
Monocytes Absolute: 0.3 10*3/uL (ref 0.1–1.0)
Monocytes Relative: 3 %
Neutro Abs: 7.6 10*3/uL (ref 1.7–7.7)
Neutrophils Relative %: 85 %
Platelets: 246 10*3/uL (ref 150–400)
RBC: 4.44 MIL/uL (ref 3.87–5.11)
RDW: 12.1 % (ref 11.5–15.5)
WBC: 9 10*3/uL (ref 4.0–10.5)
nRBC: 0 % (ref 0.0–0.2)

## 2019-12-30 MED ORDER — ONDANSETRON HCL 4 MG/2ML IJ SOLN
INTRAMUSCULAR | Status: AC
  Filled 2019-12-30: qty 2

## 2019-12-30 MED ORDER — ONDANSETRON HCL 4 MG/2ML IJ SOLN: 4.0000 mg | Freq: Once | INTRAMUSCULAR | Status: DC

## 2019-12-30 MED ORDER — SODIUM CHLORIDE 0.9 % IV BOLUS
1000.0000 mL | Freq: Once | INTRAVENOUS | Status: AC
  Administered 2019-12-30: 1000 mL via INTRAVENOUS

## 2019-12-30 MED ORDER — ONDANSETRON 4 MG PO TBDP: 4.0000 mg | ORAL_TABLET | Freq: Three times a day (TID) | ORAL | 0 refills | Status: DC | PRN

## 2019-12-30 MED ORDER — ONDANSETRON HCL 4 MG/2ML IJ SOLN
4.0000 mg | Freq: Once | INTRAMUSCULAR | Status: AC
  Administered 2019-12-30: 4 mg via INTRAVENOUS

## 2019-12-30 MED ORDER — ONDANSETRON 4 MG PO TBDP: 4.0000 mg | ORAL_TABLET | Freq: Once | ORAL | Status: DC

## 2019-12-30 NOTE — ED Triage Notes (Signed)
Pt c/o N/V/D since yesterday. Pt states vomited 4 times in the past 24hrs. Pt states had 2 bouts of diarrhea in past 24hrs.

## 2019-12-31 NOTE — ED Provider Notes (Signed)
[name]    CSN: [id number] Arrival date & time: 12/30/19  1413      History   Chief Complaint Chief Complaint  Patient presents with  . N/V/D    [name] is a 74 y.o. female comes to urgent care with complaints of abdominal pain, nausea, vomiting and diarrhea of 3 days duration.  Patient started experiencing abdominal pain/abdominal discomfort 4 days ago.  1 day after abdominal discomfort started patient started having nonmucoid loose stools as well as nonbloody nonbilious vomiting.  No abdominal distention.  No fever or chills.  No upper respiratory infection symptoms.  No loss of taste or smell.  No weight changes.  Patient is a breast cancer survivor and is being followed on a regular basis.  Patient feels fatigued, general malaise and has poor oral intake because of nausea.  She is fully vaccinated against the COVID-19 virus.Marland Kitchen   HPI  Past Medical History:  Diagnosis Date  . Anemia   . Blood transfusion 2005  . Family history of breast cancer   . Family history of prostate cancer   . Hernia   . HH (hiatus hernia)   . Hyperlipidemia   . Hypertension   . PAH (pulmonary artery hypertension) ([place])   . Personal history of chemotherapy   . Personal history of radiation therapy   . Sarcoidosis   . Shortness of breath    on exertion  . Sleep apnea    does not use cpap  . Wheezing     Patient Active Problem List   Diagnosis Date Noted  . Genetic testing 06/14/2019  . Family history of prostate cancer   . Family history of breast cancer   . Port-A-Cath in place 08/02/2018  . Pre-op evaluation 06/24/2018  . Malignant neoplasm of upper-outer quadrant of right breast in female, estrogen receptor negative ([place]) 06/07/2018  . Nausea & vomiting 11/21/2012  . Bilateral renal masses 11/21/2012  . Back pain, thoracic 08/14/2012  . Obstructive sleep apnea 05/09/2012  . GERD (gastroesophageal reflux disease) 06/28/2010  . HYPERLIPIDEMIA 12/09/2007  .  Essential hypertension 12/09/2007  . Sarcoidosis 11/26/2007  . DYSPNEA 11/26/2007    Past Surgical History:  Procedure Laterality Date  . ABDOMINAL HYSTERECTOMY  1988  . BACK SURGERY  2005   lower  . BREAST LUMPECTOMY WITH RADIOACTIVE SEED AND SENTINEL LYMPH NODE BIOPSY Right 07/02/2018   Procedure: RIGHT BREAST RADIOACTIVE SEED LUMPECTOMY X2 AND RIGHT AXILLARY  SENTINEL LYMPH NODE BIOPSY;  Surgeon: [name], MD;  Location: [facility];  Service: General;  Laterality: Right;  . ESOPHAGOGASTRODUODENOSCOPY  06/24/2012   Procedure: ESOPHAGOGASTRODUODENOSCOPY (EGD);  Surgeon: [name], MD;  Location: [name];  Service: General;  Laterality: N/A;  . HIATAL HERNIA REPAIR  08/18/2012   Procedure: LAPAROSCOPIC REPAIR OF HIATAL HERNIA;  Surgeon: [name], MD;  Location: [facility];  Service: General;  Laterality: N/A;  Laparoscopic Hiatal Hernia Repair   . INSERTION OF MESH  08/18/2012   Procedure: INSERTION OF MESH;  Surgeon: [name], MD;  Location: [facility];  Service: General;;  . IR IMAGING GUIDED PORT INSERTION  07/19/2018  . PORT-A-CATH REMOVAL Right 08/17/2019   Procedure: REMOVAL PORT-A-CATH;  Surgeon: Jovita Kussmaul, MD;  Location: [facility];  Service: General;  Laterality: Right;  . PORTACATH PLACEMENT N/A 07/02/2018   Procedure: INSERTION PORT-A-CATH TRIED NO PORT IN PLACE;  Surgeon: [name], MD;  Location: [place];  Service: General;  Laterality: N/A;  .  VESICOVAGINAL FISTULA CLOSURE W/ TAH      OB History   No obstetric history on file.      Home Medications    Prior to Admission medications   Medication Sig Start Date End Date Taking? Authorizing Provider  aspirin-sod bicarb-citric acid (ALKA-SELTZER) 325 MG TBEF tablet Take 325 mg by mouth every 6 (six) hours as needed.    [provider]  Cholecalciferol (VITAMIN D) 2000 UNITS CAPS Take 2,000 Units by mouth daily.     [provider]  Coenzyme Q10 200  MG TABS Take 200 mg by mouth daily.    [provider]  gabapentin (NEURONTIN) 100 MG capsule Take 1 capsule (100 mg total) by mouth at bedtime. 10/18/19   [name], [name], MD  hydrocortisone 2.5 % cream Apply 1 application topically 2 (two) times daily as needed (dry skin).    [provider]  ibuprofen (ADVIL,MOTRIN) 200 MG tablet Take 400 mg by mouth every 6 (six) hours as needed for headache or moderate pain.    [provider]  ketotifen (ALAWAY) 0.025 % ophthalmic solution Place 1 drop into both eyes 2 (two) times daily.    [provider]  loperamide (IMODIUM A-D) 2 MG capsule Take 2 mg by mouth daily as needed for diarrhea or loose stools.    [provider]  omeprazole (PRILOSEC) 20 MG capsule Take 20 mg by mouth daily.     [provider]  ondansetron (ZOFRAN ODT) 4 MG disintegrating tablet Take 1 tablet (4 mg total) by mouth every 8 (eight) hours as needed for nausea or vomiting. 12/30/19   Mihran Lebarron, Myrene Galas, MD  Polyvinyl Alcohol-Povidone PF (REFRESH) 1.4-0.6 % SOLN Place 1 drop into both eyes daily as needed (dry eyes).     [provider]  potassium gluconate 595 MG TABS Take 595 mg by mouth daily.     [provider]  simvastatin (ZOCOR) 20 MG tablet Take 20 mg by mouth every morning.     [provider]  triamterene-hydrochlorothiazide (MAXZIDE-25) 37.5-25 MG per tablet Take 1 tablet by mouth every morning.     [provider]  prochlorperazine (COMPAZINE) 10 MG tablet Take 1 tablet (10 mg total) by mouth every 6 (six) hours as needed (Nausea or vomiting). 07/13/18 08/20/18  [name], [name], MD    Family History Family History  Problem Relation Age of Onset  . Clotting disorder Mother        PE  . Heart disease Father   . Breast cancer Maternal Grandmother        spread to brain  . Coronary artery disease Other   . Prostate cancer Brother 73  . Breast cancer Maternal Aunt   . Cancer  Maternal Aunt        unk type  . Prostate cancer Paternal Grandfather   . Prostate cancer Brother     Social History Social History   Tobacco Use  . Smoking status: Never Smoker  . Smokeless tobacco: Never Used  Vaping Use  . Vaping Use: Never used  Substance Use Topics  . Alcohol use: No  . Drug use: No     Allergies   Shellfish allergy, Penicillins, and Skin adhesives [cyanoacrylate]   Review of Systems Review of Systems  Constitutional: Positive for activity change and fatigue. Negative for fever and unexpected weight change.  HENT: Negative.   Respiratory: Negative.   Gastrointestinal: Positive for abdominal pain, diarrhea, nausea and vomiting.  Genitourinary: Negative  for dysuria, flank pain, frequency and urgency.  Musculoskeletal: Negative.   Neurological: Negative.   Psychiatric/Behavioral: Negative for confusion and decreased concentration.     Physical Exam Triage Vital Signs ED Triage Vitals  Enc Vitals Group     BP 12/30/19 1527 115/72     Pulse Rate 12/30/19 1527 (!) 110     Resp 12/30/19 1527 16     Temp 12/30/19 1527 98.4 F (36.9 C)     Temp Source 12/30/19 1527 Oral     SpO2 12/30/19 1527 99 %     Weight 12/30/19 1528 154 lb (69.9 kg)     Height 12/30/19 1528 4\' 10"  (1.473 m)     Head Circumference --      Peak Flow --      Pain Score 12/30/19 1528 0     Pain Loc --      Pain Edu? --      Excl. in [place]? --    No data found.  Updated Vital Signs BP 115/72   Pulse (!) 110   Temp 98.4 F (36.9 C) (Oral)   Resp 16   Ht 4\' 10"  (1.473 m)   Wt 69.9 kg   SpO2 99%   BMI 32.19 kg/m   Visual Acuity Right Eye Distance:   Left Eye Distance:   Bilateral Distance:    Right Eye Near:   Left Eye Near:    Bilateral Near:     Physical Exam Vitals and nursing note reviewed.  Constitutional:      General: She is not in acute distress.    Appearance: She is ill-appearing. She is not toxic-appearing.  Cardiovascular:     Rate and Rhythm:  Normal rate and regular rhythm.     Pulses: Normal pulses.     Heart sounds: Normal heart sounds.  Pulmonary:     Effort: Pulmonary effort is normal.     Breath sounds: Normal breath sounds.  Abdominal:     General: Bowel sounds are normal. There is no distension.     Palpations: Abdomen is soft.     Tenderness: There is no abdominal tenderness.     Hernia: No hernia is present.  Musculoskeletal:     Cervical back: Normal range of motion.  Skin:    General: Skin is warm.  Neurological:     General: No focal deficit present.     Mental Status: She is alert and oriented to person, place, and time.      UC Treatments / Results  Labs (all labs ordered are listed, but only abnormal results are displayed) Labs Reviewed  COMPREHENSIVE METABOLIC PANEL - Abnormal; Notable for the following components:      Result Value   Glucose, Bld 114 (*)    Creatinine, Ser 1.47 (*)    Total Protein 8.2 (*)    GFR calc non Af Amer 35 (*)    GFR calc Af Amer 40 (*)    All other components within normal limits  CBC WITH DIFFERENTIAL/PLATELET    EKG   Radiology No results found.  Procedures Procedures (including critical care time)  Medications Ordered in UC Medications  sodium chloride 0.9 % bolus 1,000 mL (0 mLs Intravenous Stopped 12/30/19 1805)  ondansetron (ZOFRAN) injection 4 mg (4 mg Intravenous Given 12/30/19 1613)    Initial Impression / Assessment and Plan / UC Course  I have reviewed the triage vital signs and the nursing notes.  Pertinent labs & imaging results that were available  during my care of the patient were reviewed by me and considered in my medical decision making (see chart for details).     1.  Acute gastroenteritis with clinical dehydration: CBC, CMP Normal saline bolus-1 L Zofran 4 mg IV x1 dose Zofran ODT 4 mg every 6-8 hours as needed for nausea/vomiting Return precautions given Return to urgent care if your symptoms worsen. Final Clinical  Impressions(s) / UC Diagnoses   Final diagnoses:  Acute gastroenteritis   Discharge Instructions   None    ED Prescriptions    Medication Sig Dispense Auth. Provider   ondansetron (ZOFRAN ODT) 4 MG disintegrating tablet Take 1 tablet (4 mg total) by mouth every 8 (eight) hours as needed for nausea or vomiting. 20 tablet Francoise Chojnowski, Myrene Galas, MD     PDMP not reviewed this encounter.   Chase Picket, MD 12/31/19 1200

## 2020-01-01 ENCOUNTER — Emergency Department (HOSPITAL_COMMUNITY): Payer: Medicare PPO

## 2020-01-01 ENCOUNTER — Inpatient Hospital Stay (HOSPITAL_COMMUNITY)
Admission: RE | Admit: 2020-01-01 | Discharge: 2020-01-05 | DRG: 392 | Disposition: A | Discharge status: Home / Self Care | Payer: Medicare PPO | Attending: Internal Medicine | Admitting: Internal Medicine

## 2020-01-01 ENCOUNTER — Other Ambulatory Visit: Payer: Self-pay

## 2020-01-01 ENCOUNTER — Other Ambulatory Visit: Payer: Self-pay | Admitting: Physician Assistant

## 2020-01-01 ENCOUNTER — Encounter (HOSPITAL_COMMUNITY): Payer: Self-pay | Admitting: Emergency Medicine

## 2020-01-01 DIAGNOSIS — Z7982 Long term (current) use of aspirin: Secondary | ICD-10-CM

## 2020-01-01 DIAGNOSIS — M549 Dorsalgia, unspecified: Secondary | ICD-10-CM | POA: Diagnosis present

## 2020-01-01 DIAGNOSIS — K2100 Gastro-esophageal reflux disease with esophagitis, without bleeding: Secondary | ICD-10-CM | POA: Diagnosis present

## 2020-01-01 DIAGNOSIS — K449 Diaphragmatic hernia without obstruction or gangrene: Secondary | ICD-10-CM | POA: Diagnosis present

## 2020-01-01 DIAGNOSIS — N2889 Other specified disorders of kidney and ureter: Secondary | ICD-10-CM | POA: Diagnosis present

## 2020-01-01 DIAGNOSIS — G4733 Obstructive sleep apnea (adult) (pediatric): Secondary | ICD-10-CM | POA: Diagnosis present

## 2020-01-01 DIAGNOSIS — Z90710 Acquired absence of both cervix and uterus: Secondary | ICD-10-CM | POA: Diagnosis not present

## 2020-01-01 DIAGNOSIS — K317 Polyp of stomach and duodenum: Secondary | ICD-10-CM | POA: Diagnosis present

## 2020-01-01 DIAGNOSIS — I5032 Chronic diastolic (congestive) heart failure: Secondary | ICD-10-CM | POA: Diagnosis present

## 2020-01-01 DIAGNOSIS — N1832 Chronic kidney disease, stage 3b: Secondary | ICD-10-CM | POA: Diagnosis present

## 2020-01-01 DIAGNOSIS — Z20822 Contact with and (suspected) exposure to covid-19: Secondary | ICD-10-CM | POA: Diagnosis present

## 2020-01-01 DIAGNOSIS — K529 Noninfective gastroenteritis and colitis, unspecified: Secondary | ICD-10-CM | POA: Diagnosis present

## 2020-01-01 DIAGNOSIS — E876 Hypokalemia: Secondary | ICD-10-CM | POA: Diagnosis present

## 2020-01-01 DIAGNOSIS — K219 Gastro-esophageal reflux disease without esophagitis: Secondary | ICD-10-CM | POA: Diagnosis not present

## 2020-01-01 DIAGNOSIS — Z8042 Family history of malignant neoplasm of prostate: Secondary | ICD-10-CM

## 2020-01-01 DIAGNOSIS — R1110 Vomiting, unspecified: Secondary | ICD-10-CM | POA: Diagnosis present

## 2020-01-01 DIAGNOSIS — Z791 Long term (current) use of non-steroidal anti-inflammatories (NSAID): Secondary | ICD-10-CM

## 2020-01-01 DIAGNOSIS — C50919 Malignant neoplasm of unspecified site of unspecified female breast: Secondary | ICD-10-CM | POA: Diagnosis present

## 2020-01-01 DIAGNOSIS — R531 Weakness: Secondary | ICD-10-CM | POA: Diagnosis not present

## 2020-01-01 DIAGNOSIS — K2210 Ulcer of esophagus without bleeding: Secondary | ICD-10-CM | POA: Diagnosis present

## 2020-01-01 DIAGNOSIS — E785 Hyperlipidemia, unspecified: Secondary | ICD-10-CM

## 2020-01-01 DIAGNOSIS — N183 Chronic kidney disease, stage 3 unspecified: Secondary | ICD-10-CM

## 2020-01-01 DIAGNOSIS — N200 Calculus of kidney: Secondary | ICD-10-CM | POA: Diagnosis not present

## 2020-01-01 DIAGNOSIS — R079 Chest pain, unspecified: Secondary | ICD-10-CM | POA: Diagnosis not present

## 2020-01-01 DIAGNOSIS — Z91013 Allergy to seafood: Secondary | ICD-10-CM | POA: Diagnosis not present

## 2020-01-01 DIAGNOSIS — Z91048 Other nonmedicinal substance allergy status: Secondary | ICD-10-CM | POA: Diagnosis not present

## 2020-01-01 DIAGNOSIS — Z832 Family history of diseases of the blood and blood-forming organs and certain disorders involving the immune mechanism: Secondary | ICD-10-CM

## 2020-01-01 DIAGNOSIS — Z923 Personal history of irradiation: Secondary | ICD-10-CM

## 2020-01-01 DIAGNOSIS — K209 Esophagitis, unspecified without bleeding: Secondary | ICD-10-CM | POA: Diagnosis not present

## 2020-01-01 DIAGNOSIS — I2721 Secondary pulmonary arterial hypertension: Secondary | ICD-10-CM | POA: Diagnosis present

## 2020-01-01 DIAGNOSIS — K920 Hematemesis: Secondary | ICD-10-CM | POA: Diagnosis present

## 2020-01-01 DIAGNOSIS — Z853 Personal history of malignant neoplasm of breast: Secondary | ICD-10-CM

## 2020-01-01 DIAGNOSIS — Z880 Allergy status to penicillin: Secondary | ICD-10-CM | POA: Diagnosis not present

## 2020-01-01 DIAGNOSIS — J984 Other disorders of lung: Secondary | ICD-10-CM | POA: Diagnosis not present

## 2020-01-01 DIAGNOSIS — Z79899 Other long term (current) drug therapy: Secondary | ICD-10-CM

## 2020-01-01 DIAGNOSIS — Z803 Family history of malignant neoplasm of breast: Secondary | ICD-10-CM

## 2020-01-01 DIAGNOSIS — K5900 Constipation, unspecified: Secondary | ICD-10-CM | POA: Diagnosis present

## 2020-01-01 DIAGNOSIS — R0789 Other chest pain: Secondary | ICD-10-CM | POA: Diagnosis not present

## 2020-01-01 DIAGNOSIS — K259 Gastric ulcer, unspecified as acute or chronic, without hemorrhage or perforation: Secondary | ICD-10-CM | POA: Diagnosis not present

## 2020-01-01 DIAGNOSIS — E860 Dehydration: Secondary | ICD-10-CM | POA: Diagnosis present

## 2020-01-01 DIAGNOSIS — R112 Nausea with vomiting, unspecified: Secondary | ICD-10-CM | POA: Diagnosis not present

## 2020-01-01 DIAGNOSIS — I130 Hypertensive heart and chronic kidney disease with heart failure and stage 1 through stage 4 chronic kidney disease, or unspecified chronic kidney disease: Secondary | ICD-10-CM | POA: Diagnosis present

## 2020-01-01 DIAGNOSIS — K3189 Other diseases of stomach and duodenum: Secondary | ICD-10-CM | POA: Diagnosis not present

## 2020-01-01 DIAGNOSIS — Z9221 Personal history of antineoplastic chemotherapy: Secondary | ICD-10-CM

## 2020-01-01 LAB — BASIC METABOLIC PANEL
Anion gap: 18 — ABNORMAL HIGH (ref 5–15)
BUN: 25 mg/dL — ABNORMAL HIGH (ref 8–23)
CO2: 25 mmol/L (ref 22–32)
Calcium: 9.6 mg/dL (ref 8.9–10.3)
Chloride: 100 mmol/L (ref 98–111)
Creatinine, Ser: 1.3 mg/dL — ABNORMAL HIGH (ref 0.44–1.00)
GFR calc Af Amer: 46 mL/min — ABNORMAL LOW (ref >60)
GFR calc non Af Amer: 40 mL/min — ABNORMAL LOW (ref >60)
Glucose, Bld: 140 mg/dL — ABNORMAL HIGH (ref 70–99)
Potassium: 3.2 mmol/L — ABNORMAL LOW (ref 3.5–5.1)
Sodium: 143 mmol/L (ref 135–145)

## 2020-01-01 LAB — LIPASE, BLOOD: Lipase: 19 U/L (ref 11–51)

## 2020-01-01 LAB — CBC
HCT: 40.9 % (ref 36.0–46.0)
Hemoglobin: 13.5 g/dL (ref 12.0–15.0)
MCH: 30.9 pg (ref 26.0–34.0)
MCHC: 33 g/dL (ref 30.0–36.0)
MCV: 93.6 fL (ref 80.0–100.0)
Platelets: 220 10*3/uL (ref 150–400)
RBC: 4.37 MIL/uL (ref 3.87–5.11)
RDW: 12.5 % (ref 11.5–15.5)
WBC: 10 10*3/uL (ref 4.0–10.5)
nRBC: 0 % (ref 0.0–0.2)

## 2020-01-01 LAB — HEPATIC FUNCTION PANEL
ALT: 15 U/L (ref 0–44)
AST: 22 U/L (ref 15–41)
Albumin: 4.2 g/dL (ref 3.5–5.0)
Alkaline Phosphatase: 76 U/L (ref 38–126)
Bilirubin, Direct: 0.1 mg/dL (ref 0.0–0.2)
Indirect Bilirubin: 0.9 mg/dL (ref 0.3–0.9)
Total Bilirubin: 1 mg/dL (ref 0.3–1.2)
Total Protein: 8 g/dL (ref 6.5–8.1)

## 2020-01-01 LAB — PROTIME-INR
INR: 1.2 (ref 0.8–1.2)
Prothrombin Time: 15 seconds (ref 11.4–15.2)

## 2020-01-01 LAB — C-REACTIVE PROTEIN: CRP: 0.6 mg/dL (ref <1.0)

## 2020-01-01 LAB — TROPONIN I (HIGH SENSITIVITY)
Troponin I (High Sensitivity): 6 ng/L (ref <18)
Troponin I (High Sensitivity): 5 ng/L (ref <18)

## 2020-01-01 LAB — SARS CORONAVIRUS 2 BY RT PCR (HOSPITAL ORDER, PERFORMED IN ~~LOC~~ HOSPITAL LAB): SARS Coronavirus 2: NEGATIVE

## 2020-01-01 MED ORDER — LOPERAMIDE HCL 2 MG PO CAPS: 2.0000 mg | ORAL_CAPSULE | Freq: Every day | ORAL | Status: DC | PRN

## 2020-01-01 MED ORDER — ONDANSETRON HCL 4 MG/2ML IJ SOLN
4.0000 mg | Freq: Four times a day (QID) | INTRAMUSCULAR | Status: DC | PRN
  Administered 2020-01-02 – 2020-01-04 (×2): 4 mg via INTRAVENOUS
  Filled 2020-01-01 (×2): qty 2

## 2020-01-01 MED ORDER — RISAQUAD PO CAPS
2.0000 | ORAL_CAPSULE | Freq: Every day | ORAL | Status: DC
  Administered 2020-01-02 – 2020-01-05 (×4): 2 via ORAL
  Filled 2020-01-01 (×5): qty 2

## 2020-01-01 MED ORDER — SIMVASTATIN 20 MG PO TABS
20.0000 mg | ORAL_TABLET | Freq: Every morning | ORAL | Status: DC
  Administered 2020-01-02 – 2020-01-05 (×4): 20 mg via ORAL
  Filled 2020-01-01 (×4): qty 1

## 2020-01-01 MED ORDER — ACETAMINOPHEN 650 MG RE SUPP: 650.0000 mg | Freq: Four times a day (QID) | RECTAL | Status: DC | PRN

## 2020-01-01 MED ORDER — ALUM & MAG HYDROXIDE-SIMETH 200-200-20 MG/5ML PO SUSP
30.0000 mL | Freq: Once | ORAL | Status: AC
  Administered 2020-01-01: 30 mL via ORAL
  Filled 2020-01-01: qty 30

## 2020-01-01 MED ORDER — ALUM & MAG HYDROXIDE-SIMETH 200-200-20 MG/5ML PO SUSP: 30.0000 mL | Freq: Four times a day (QID) | ORAL | Status: DC | PRN

## 2020-01-01 MED ORDER — IOHEXOL 300 MG/ML  SOLN
100.0000 mL | Freq: Once | INTRAMUSCULAR | Status: AC | PRN
  Administered 2020-01-01: 100 mL via INTRAVENOUS

## 2020-01-01 MED ORDER — HYDROCORTISONE 2.5 % EX CREA: 1.0000 "application " | TOPICAL_CREAM | Freq: Two times a day (BID) | CUTANEOUS | Status: DC | PRN

## 2020-01-01 MED ORDER — KETOTIFEN FUMARATE 0.025 % OP SOLN
1.0000 [drp] | Freq: Two times a day (BID) | OPHTHALMIC | Status: DC | PRN
  Filled 2020-01-01: qty 5

## 2020-01-01 MED ORDER — ONDANSETRON HCL 4 MG/2ML IJ SOLN
4.0000 mg | Freq: Once | INTRAMUSCULAR | Status: AC
  Administered 2020-01-01: 4 mg via INTRAVENOUS
  Filled 2020-01-01: qty 2

## 2020-01-01 MED ORDER — SODIUM CHLORIDE 0.9 % IV SOLN: INTRAVENOUS | Status: DC

## 2020-01-01 MED ORDER — SODIUM CHLORIDE 0.9% FLUSH
3.0000 mL | Freq: Two times a day (BID) | INTRAVENOUS | Status: DC
  Administered 2020-01-02 – 2020-01-05 (×5): 3 mL via INTRAVENOUS

## 2020-01-01 MED ORDER — GABAPENTIN 100 MG PO CAPS
100.0000 mg | ORAL_CAPSULE | Freq: Every day | ORAL | Status: DC
  Administered 2020-01-01 – 2020-01-04 (×4): 100 mg via ORAL
  Filled 2020-01-01 (×4): qty 1

## 2020-01-01 MED ORDER — ENOXAPARIN SODIUM 40 MG/0.4ML ~~LOC~~ SOLN
40.0000 mg | SUBCUTANEOUS | Status: DC
  Administered 2020-01-01: 40 mg via SUBCUTANEOUS
  Filled 2020-01-01: qty 0.4

## 2020-01-01 MED ORDER — ALBUTEROL SULFATE (2.5 MG/3ML) 0.083% IN NEBU: 2.5000 mg | INHALATION_SOLUTION | RESPIRATORY_TRACT | Status: DC | PRN

## 2020-01-01 MED ORDER — METRONIDAZOLE IN NACL 5-0.79 MG/ML-% IV SOLN
500.0000 mg | Freq: Three times a day (TID) | INTRAVENOUS | Status: DC
  Administered 2020-01-01 – 2020-01-03 (×6): 500 mg via INTRAVENOUS
  Filled 2020-01-01 (×6): qty 100

## 2020-01-01 MED ORDER — SODIUM CHLORIDE 0.9% FLUSH
3.0000 mL | Freq: Once | INTRAVENOUS | Status: AC
  Administered 2020-01-01: 3 mL via INTRAVENOUS

## 2020-01-01 MED ORDER — PANTOPRAZOLE SODIUM 40 MG PO TBEC: 40.0000 mg | DELAYED_RELEASE_TABLET | Freq: Every day | ORAL | Status: DC

## 2020-01-01 MED ORDER — LIDOCAINE VISCOUS HCL 2 % MT SOLN
15.0000 mL | Freq: Once | OROMUCOSAL | Status: AC
  Administered 2020-01-01: 15 mL via ORAL
  Filled 2020-01-01: qty 15

## 2020-01-01 MED ORDER — CIPROFLOXACIN IN D5W 400 MG/200ML IV SOLN
400.0000 mg | Freq: Two times a day (BID) | INTRAVENOUS | Status: DC
  Administered 2020-01-01 – 2020-01-03 (×4): 400 mg via INTRAVENOUS
  Filled 2020-01-01 (×5): qty 200

## 2020-01-01 MED ORDER — HYDROCORTISONE 1 % EX CREA
TOPICAL_CREAM | Freq: Two times a day (BID) | CUTANEOUS | Status: DC | PRN
  Filled 2020-01-01: qty 28

## 2020-01-01 MED ORDER — ACETAMINOPHEN 325 MG PO TABS
650.0000 mg | ORAL_TABLET | Freq: Four times a day (QID) | ORAL | Status: DC | PRN
  Administered 2020-01-05: 650 mg via ORAL
  Filled 2020-01-01: qty 2

## 2020-01-01 MED ORDER — POTASSIUM CHLORIDE CRYS ER 20 MEQ PO TBCR: 40.0000 meq | EXTENDED_RELEASE_TABLET | Freq: Once | ORAL | Status: DC

## 2020-01-01 MED ORDER — ONDANSETRON HCL 4 MG PO TABS: 4.0000 mg | ORAL_TABLET | Freq: Four times a day (QID) | ORAL | Status: DC | PRN

## 2020-01-01 MED ORDER — POLYVINYL ALCOHOL 1.4 % OP SOLN
1.0000 [drp] | OPHTHALMIC | Status: DC | PRN
  Filled 2020-01-01: qty 15

## 2020-01-01 MED ORDER — LIDOCAINE VISCOUS HCL 2 % MT SOLN
15.0000 mL | Freq: Four times a day (QID) | OROMUCOSAL | Status: DC | PRN
  Filled 2020-01-01: qty 15

## 2020-01-01 MED ORDER — TRIAMTERENE-HCTZ 37.5-25 MG PO TABS
1.0000 | ORAL_TABLET | Freq: Every morning | ORAL | Status: DC
  Filled 2020-01-01: qty 1

## 2020-01-01 MED ORDER — SODIUM CHLORIDE 0.9 % IV BOLUS
500.0000 mL | Freq: Once | INTRAVENOUS | Status: AC
  Administered 2020-01-01: 500 mL via INTRAVENOUS

## 2020-01-01 MED ORDER — METHOCARBAMOL 500 MG PO TABS: 500.0000 mg | ORAL_TABLET | Freq: Three times a day (TID) | ORAL | Status: DC | PRN

## 2020-01-01 MED ORDER — POLYVINYL ALCOHOL-POVIDONE PF 1.4-0.6 % OP SOLN: 1.0000 [drp] | Freq: Every day | OPHTHALMIC | Status: DC | PRN

## 2020-01-01 MED ORDER — SODIUM CHLORIDE 0.9 % IV SOLN: Freq: Once | INTRAVENOUS | Status: AC

## 2020-01-01 NOTE — ED Triage Notes (Addendum)
Patient reports intermittent central chest pain with emesis and diarrhea onset Thursday this week , denies SOB , no cough or fever .

## 2020-01-01 NOTE — H&P (Addendum)
History and Physical    Rachel Kelly:[id number] DOB: 1946/06/04 DOA: 01/01/2020  Referring MD/NP/PA: Starlyn Skeans, PA-C PCP: [name], MD  Patient coming from: home  Chief Complaint: Persistent vomiting  I have personally briefly reviewed patient's old medical records in [place]   HPI: Rachel Kelly is a 74 y.o. female with medical history significant of hypertension, hyperlipidemia, breast cancer s/p right lumpectomy, anemia, pulmonary artery hypertension, chronic kidney disease stage IIIb, and OSA not on CPAP.  She presents with complaints of persistent vomiting over the last few days.  She reports last  having a loose stool on 4 days ago when symptoms started.  Patient has been unable to keep any significant amount of food or liquids down.  Emesis is noted to be nonbloody in appearance.  Associated symptoms include generalized malaise and complaints of chest pain.  Patient points to her epigastric region and substernally when she describes having a pain that feels like someone is punching her.  Symptoms usually had been associated with vomiting.  She was seen at urgent care 2 days ago, but was discharged home with antiemetics.  Patient felt as though she were not getting better and came to the hospital for further evaluation.  She has not really been able to pass gas.  Denies having any fever, diarrhea, abdominal pain, or abdominal distention.  With patient's daughter at bedside she notes that for the last 4 to 5 years she has been being followed by [name] of urology for lesions of her kidneys.  ED Course: Upon admission into the emergency department patient was seen to have stable vital signs.  Labs noted WBC 10, potassium 3.2, BUN 20, creatinine 1.3, and troponin negative x2.  Chest x-ray showed just chronic bronchitic changes without acute signs of infection.  CT scan of the abdomen pelvis with contrast did note circumferential thickening of the colon concerning for  enteritis and bilateral enhancing renal lesions similar to a renal protocol from 07/2019.  Patient was given 40 mg of potassium chloride p.o., Zofran, and 500 mL of normal saline IV fluids.  TRH called to admit for suspected colitis.  Review of Systems  Constitutional: Positive for malaise/fatigue. Negative for chills, fever and weight loss.  Eyes: Negative for photophobia and pain.  Respiratory: Negative for cough and shortness of breath.   Cardiovascular: Positive for chest pain. Negative for leg swelling.  Gastrointestinal: Positive for constipation, nausea and vomiting. Negative for abdominal pain.  Genitourinary: Negative for dysuria and hematuria.  Musculoskeletal: Negative for back pain and joint pain.  Skin: Negative for rash.  Neurological: Negative for focal weakness and loss of consciousness.  Psychiatric/Behavioral: Positive for memory loss. Negative for substance abuse.    Past Medical History:  Diagnosis Date  . Anemia   . Blood transfusion 2005  . Family history of breast cancer   . Family history of prostate cancer   . Hernia   . HH (hiatus hernia)   . Hyperlipidemia   . Hypertension   . PAH (pulmonary artery hypertension) ([place])   . Personal history of chemotherapy   . Personal history of radiation therapy   . Sarcoidosis   . Shortness of breath    on exertion  . Sleep apnea    does not use cpap  . Wheezing     Past Surgical History:  Procedure Laterality Date  . ABDOMINAL HYSTERECTOMY  1988  . BACK SURGERY  2005   lower  . BREAST LUMPECTOMY WITH RADIOACTIVE  SEED AND SENTINEL LYMPH NODE BIOPSY Right 07/02/2018   Procedure: RIGHT BREAST RADIOACTIVE SEED LUMPECTOMY X2 AND RIGHT AXILLARY  SENTINEL LYMPH NODE BIOPSY;  Surgeon: [name], MD;  Location: [facility];  Service: General;  Laterality: Right;  . ESOPHAGOGASTRODUODENOSCOPY  06/24/2012   Procedure: ESOPHAGOGASTRODUODENOSCOPY (EGD);  Surgeon: [name], MD;  Location: [name];  Service:  General;  Laterality: N/A;  . HIATAL HERNIA REPAIR  08/18/2012   Procedure: LAPAROSCOPIC REPAIR OF HIATAL HERNIA;  Surgeon: [name], MD;  Location: [facility];  Service: General;  Laterality: N/A;  Laparoscopic Hiatal Hernia Repair   . INSERTION OF MESH  08/18/2012   Procedure: INSERTION OF MESH;  Surgeon: [name], MD;  Location: [facility];  Service: General;;  . IR IMAGING GUIDED PORT INSERTION  07/19/2018  . PORT-A-CATH REMOVAL Right 08/17/2019   Procedure: REMOVAL PORT-A-CATH;  Surgeon: Jovita Kussmaul, MD;  Location: [place];  Service: General;  Laterality: Right;  . PORTACATH PLACEMENT N/A 07/02/2018   Procedure: INSERTION PORT-A-CATH TRIED NO PORT IN PLACE;  Surgeon: [name], MD;  Location: [facility];  Service: General;  Laterality: N/A;  . VESICOVAGINAL FISTULA CLOSURE W/ TAH       reports that she has never smoked. She has never used smokeless tobacco. She reports that she does not drink alcohol and does not use drugs.  Allergies  Allergen Reactions  . Shellfish Allergy Other (See Comments)    Pain all over  . Penicillins Rash    Fine red bumps that spread all over the body. Has patient had a PCN reaction causing immediate rash, facial/tongue/throat swelling, SOB or lightheadedness with hypotension: No Has patient had a PCN reaction causing severe rash involving mucus membranes or skin necrosis: No Has patient had a PCN reaction that required hospitalization: No Has patient had a PCN reaction occurring within the last 10 years: No If all of the above answers are "NO", then may proceed with Cephalosporin use.   . Skin Adhesives [Cyanoacrylate] Itching and Rash    steri strips    Family History  Problem Relation Age of Onset  . Clotting disorder Mother        PE  . Heart disease Father   . Breast cancer Maternal Grandmother        spread to brain  . Coronary artery disease Other   . Prostate cancer Brother 66  . Breast cancer  Maternal Aunt   . Cancer Maternal Aunt        unk type  . Prostate cancer Paternal Grandfather   . Prostate cancer Brother     Prior to Admission medications   Medication Sig Start Date End Date Taking? Authorizing Provider  aspirin-sod bicarb-citric acid (ALKA-SELTZER) 325 MG TBEF tablet Take 325 mg by mouth every 6 (six) hours as needed.    [provider]  Cholecalciferol (VITAMIN D) 2000 UNITS CAPS Take 2,000 Units by mouth daily.     [provider]  Coenzyme Q10 200 MG TABS Take 200 mg by mouth daily.    [provider]  gabapentin (NEURONTIN) 100 MG capsule Take 1 capsule (100 mg total) by mouth at bedtime. 10/18/19   [name], [name], MD  hydrocortisone 2.5 % cream Apply 1 application topically 2 (two) times daily as needed (dry skin).    [provider]  ibuprofen (ADVIL,MOTRIN) 200 MG tablet Take 400 mg by mouth every 6 (six) hours as needed for headache or moderate pain.  [provider]  ketotifen (ALAWAY) 0.025 % ophthalmic solution Place 1 drop into both eyes 2 (two) times daily.    [provider]  loperamide (IMODIUM A-D) 2 MG capsule Take 2 mg by mouth daily as needed for diarrhea or loose stools.    [provider]  omeprazole (PRILOSEC) 20 MG capsule Take 20 mg by mouth daily.     [provider]  ondansetron (ZOFRAN ODT) 4 MG disintegrating tablet Take 1 tablet (4 mg total) by mouth every 8 (eight) hours as needed for nausea or vomiting. 12/30/19   Lamptey, Myrene Galas, MD  Polyvinyl Alcohol-Povidone PF (REFRESH) 1.4-0.6 % SOLN Place 1 drop into both eyes daily as needed (dry eyes).     [provider]  potassium gluconate 595 MG TABS Take 595 mg by mouth daily.     [provider]  simvastatin (ZOCOR) 20 MG tablet Take 20 mg by mouth every morning.     [provider]  triamterene-hydrochlorothiazide (MAXZIDE-25) 37.5-25 MG per tablet Take 1 tablet by mouth every morning.      [provider]  prochlorperazine (COMPAZINE) 10 MG tablet Take 1 tablet (10 mg total) by mouth every 6 (six) hours as needed (Nausea or vomiting). 07/13/18 08/20/18  [name], [name], MD    Physical Exam:  Constitutional: NAD, calm, comfortable Vitals:   01/01/20 0547 01/01/20 0554 01/01/20 0945  BP:  117/77 138/83  Pulse:  100 79  Resp:  18 14  Temp:  98.2 F (36.8 C)   TempSrc:  Oral   SpO2:  96% 98%  Weight: 76 kg    Height: 4\' 11"  (1.499 m)     Eyes: PERRL, lids and conjunctivae normal ENMT: Mucous membranes are moist. Posterior pharynx clear of any exudate or lesions.Normal dentition.  Neck: normal, supple, no masses, no thyromegaly Respiratory: clear to auscultation bilaterally, no wheezing, no crackles. Normal respiratory effort. No accessory muscle use.  Cardiovascular: Regular rate and rhythm, no murmurs / rubs / gallops. No extremity edema. 2+ pedal pulses. No carotid bruits.  Abdomen: no tenderness, no masses palpated. No hepatosplenomegaly. Bowel sounds positive.  Musculoskeletal: no clubbing / cyanosis. No joint deformity upper and lower extremities. Good ROM, no contractures. Normal muscle tone.  Skin: no rashes, lesions, ulcers. No induration Neurologic: CN 2-12 grossly intact. Sensation intact, DTR normal. Strength 5/5 in all 4.  Psychiatric: Normal judgment and insight. Alert and oriented x 3. Normal mood.     Labs on Admission: I have personally reviewed following labs and imaging studies  CBC: Recent Labs  Lab 12/30/19 1552 01/01/20 0558  WBC 9.0 10.0  NEUTROABS 7.6  --   HGB 13.5 13.5  HCT 40.9 40.9  MCV 92.1 93.6  PLT 246 732   Basic Metabolic Panel: Recent Labs  Lab 12/30/19 1552 01/01/20 0558  NA 139 143  K 4.5 3.2*  CL 100 100  CO2 25 25  GLUCOSE 114* 140*  BUN 22 25*  CREATININE 1.47* 1.30*  CALCIUM 10.1 9.6   GFR: Estimated Creatinine Clearance: 33.2 mL/min (A) (by C-G formula based on SCr of 1.3 mg/dL (H)). Liver  Function Tests: Recent Labs  Lab 12/30/19 1552  AST 27  ALT 16  ALKPHOS 102  BILITOT 1.2  PROT 8.2*  ALBUMIN 4.2   Recent Labs  Lab 01/01/20 0942  LIPASE 19   No results for input(s): AMMONIA in the last 168 hours. Coagulation Profile: Recent Labs  Lab 01/01/20 0558  INR 1.2  Cardiac Enzymes: No results for input(s): CKTOTAL, CKMB, CKMBINDEX, TROPONINI in the last 168 hours. BNP (last 3 results) No results for input(s): PROBNP in the last 8760 hours. HbA1C: No results for input(s): HGBA1C in the last 72 hours. CBG: No results for input(s): GLUCAP in the last 168 hours. Lipid Profile: No results for input(s): CHOL, HDL, LDLCALC, TRIG, CHOLHDL, LDLDIRECT in the last 72 hours. Thyroid Function Tests: No results for input(s): TSH, T4TOTAL, FREET4, T3FREE, THYROIDAB in the last 72 hours. Anemia Panel: No results for input(s): VITAMINB12, FOLATE, FERRITIN, TIBC, IRON, RETICCTPCT in the last 72 hours. Urine analysis:    Component Value Date/Time   COLORURINE STRAW (A) 07/27/2018 1050   APPEARANCEUR CLEAR 07/27/2018 1050   LABSPEC 1.020 08/05/2019 1155   PHURINE 6.0 08/05/2019 1155   GLUCOSEU NEGATIVE 08/05/2019 1155   HGBUR MODERATE (A) 08/05/2019 1155   BILIRUBINUR NEGATIVE 08/05/2019 1155   KETONESUR NEGATIVE 08/05/2019 1155   PROTEINUR 30 (A) 08/05/2019 1155   UROBILINOGEN 0.2 08/05/2019 1155   NITRITE POSITIVE (A) 08/05/2019 1155   LEUKOCYTESUR LARGE (A) 08/05/2019 1155   Sepsis Labs: No results found for this or any previous visit (from the past 240 hour(s)).   Radiological Exams on Admission: DG Chest 2 View  Result Date: 01/01/2020 CLINICAL DATA:  Centralized chest pain. EXAM: CHEST - 2 VIEW COMPARISON:  09/07/2019; chest CT-01/15/2012 FINDINGS: Grossly unchanged borderline enlarged cardiac silhouette and mediastinal contours with calcified mediastinal and hilar lymph nodes. Redemonstrated mild diffuse slightly nodular thickening of the pulmonary  interstitium. No discrete focal airspace opacities. No pleural effusion or pneumothorax. No evidence of edema. No acute osseous abnormalities. Post lower lumbar paraspinal fusion, incompletely evaluated. Stigmata of dish within the thoracic spine. Surgical clips overlie the inferolateral aspect of the right breast. IMPRESSION: 1. Chronic bronchitic change without superimposed acute cardiopulmonary disease. 2. Redemonstrated calcified mediastinal and hilar lymph nodes, likely the sequela of previous granulomatous infection. Electronically Signed   By: [name]   On: 01/01/2020 06:24   CT Abdomen Pelvis W Contrast  Result Date: 01/01/2020 CLINICAL DATA:  Abdominal pain and vomiting since this past Tuesday. EXAM: CT ABDOMEN AND PELVIS WITH CONTRAST TECHNIQUE: Multidetector CT imaging of the abdomen and pelvis was performed using the standard protocol following bolus administration of intravenous contrast. CONTRAST:  154mL OMNIPAQUE IOHEXOL 300 MG/ML  SOLN COMPARISON:  08/04/2019; 11/21/2012 FINDINGS: Lower chest: Limited visualization of the lower thorax is negative for focal airspace opacity or pleural effusion. Cardiomegaly.  No pericardial effusion. Hepatobiliary: Normal hepatic contour. There is mild diffuse decreased attenuation hepatic parenchyma on this postcontrast examination suggestive of hepatic steatosis. There is a minimal amount of focal fatty infiltration adjacent to the fissure for the ligamentum teres. Normal appearance of the gallbladder. No radiopaque gallstones. No intra or extrahepatic biliary ductal dilatation. No ascites. Pancreas: Normal appearance of the pancreas. Spleen: Normal appearance of the spleen. Adrenals/Urinary Tract: There is symmetric enhancement and excretion of the bilateral kidneys. There is an approximately 1.7 x 1.4 x 1.5 cm enhancing partially exophytic lesion arising from the interpolar lateral aspect of the right kidney (axial image 31, series 3; coronal image  59, series 6), as well as a 1.7 x 1.6 x 1.5 cm heterogeneously enhancing lesion involving the inferior pole of the left kidney (axial image 37, series 3; coronal image 52, series 6) and approximately 1.3 x 1.3 x 0.9 cm partially enhancing lesion involving the interpolar aspect of the left kidney (axial image 31, series 3; coronal image  56, series 6), all of which appears similar to the 07/2018 examination and remain worrisome for renal cell carcinomas. While additional renal lesions are too small to accurately characterize several lesions appear to demonstrate enhancement and are worrisome for additional areas of renal cell carcinoma (representative coronal images 53 and 56, series 6). The bilateral renal veins appear patent. No definite evidence of nephrolithiasis on this postcontrast examination. No urine obstruction or perinephric stranding. Normal appearance the bilateral adrenal glands. Normal appearance of the urinary bladder given underdistention. Stomach/Bowel: There is moderate circumferential wall thickening involving the majority of the colon extending from the level of the hepatic flexure to the rectum, potentially accentuated due to underdistention though could be seen in the setting of an enteritis. No associated enteric obstruction. Otherwise, there are no discrete areas of bowel wall thickening. Normal appearance of the terminal ileum and the retrocecal appendix. Redemonstrated poor surgical change of the GE junction with persistent patulous distension of distal aspect of the esophagus. No pneumoperitoneum, pneumatosis or portal venous gas. Vascular/Lymphatic: Atherosclerotic plaque within a tortuous but normal caliber abdominal aorta. No bulky retroperitoneal, mesenteric, pelvic or inguinal lymphadenopathy. Reproductive: Post hysterectomy.  No discrete adnexal lesion. Other: Tiny mesenteric fat containing peri umbilical hernia. Musculoskeletal: No acute or aggressive osseous abnormalities. Post  L4-L5 paraspinal fusion and intervertebral disc space replacement with left lateral fusion and intervertebral disc space replacement of the L3-L4 vertebral body. No definite evidence of hardware failure or loosening. Stigmata of dish within thoracic spine. IMPRESSION: 1. Apparent circumferential wall thickening involving the majority of the colon, potentially accentuated due to underdistention though conceivably an enteritis could have a similar appearance. No evidence of enteric obstruction. 2. Bilateral enhancing lesions (right renal lesion measuring 1.2 cm, left-sided renal lesions measuring 1.7 and 1.3 cm) worrisome for bilateral renal cell carcinomas, similar to dedicated renal protocol CT scan performed 07/2019. No evidence of renal vein involvement or additional metastasis. 3. Additional smaller (subcentimeter) though potentially enhancing bilateral renal lesions which are too small to accurately characterize though are also worrisome for additional renal cell carcinomas and warrant continued dedicated follow-up at the discretion of [name]. 4. Aortic Atherosclerosis (ICD10-I70.0). Electronically Signed   By: [name]   On: 01/01/2020 10:47    EKG: Independently reviewed.  Sinus arrhythmia 81 bpm  Assessment/Plan Enteritis/colitis: Acute.  Patient presents with 4 days of nausea and vomiting.  Her last bowel movement was 4 days ago.  White blood cell count was noted to increase slightly, but no initial differential have been obtained.  CT imaging noted diffuse colonic thickening without signs of obstruction.  Question colitis versus enteritis.   -Admit to a medical telemetry bed -Add on CRP -Clear liquid diet and advance as tolerated -Antiemetics as needed -Normal saline IV fluids at 75 mL/h x1 L -Empiric antibiotics ciprofloxacin and metronidazole IV  Chest pain: Patient reports having substernal and epigastric pain.  Cardiac troponins negative x2.  EKG without significant ischemic  changes.  Question acid reflux or indigestion. -Follow-up telemetry overnight -Hold NSAIDs.  Discussed with patient need to avoid frequent use of NSAIDs -GI cocktail for indigestion as needed  Chronic kidney disease stage IIIb: Creatinine mildly elevated up to 1.3 with BUN 25.  Patient baseline creatinine previously noted to be around 1.1-1.2.  Does not meet criteria for AKI, but suspect patient is dehydrated. -Continue IV fluids as seen above -Recheck kidney function in a.m.  Diastolic congestive heart failure: Last EF noted to be 55 to 60% with grade  1 diastolic dysfunction on 8/67/7373.  -Continue to monitor  History of breast cancer: Patient status post right lumpectomy and chemotherapy and adjunctive radiation.  Followed in outpatient setting by [name].  Currently in observation. -Continue outpatient follow-up  Bilateral renal mass: Appears to have previously documented on CT scan from January 2021.  CT imaging today again notes signs of bilateral renal lesions concerning for renal cell carcinoma.  Patient reported to be being followed by [name] of urology for 4-5 years now. -Continue outpatient follow-up  Hyperlipidemia -Continue simvastatin  GERD -Continue pharmacy substitution of Protonix   DVT prophylaxis: lovenox Code Status: Full Family Communication: Daughter present at bedside Disposition Plan: Possible discharge home in 1 to 2 days Consults called: none  Admission status: Observation  Norval Morton MD [facility] Pager [phone]   If 7PM-7AM, please contact night-coverage [email or web address] Password [facility]  01/01/2020, 11:32 AM

## 2020-01-01 NOTE — Progress Notes (Signed)
Pt arrived to 6N10. Pt alert and awake. Report received from [place], [place]. See assessment. Will continue to monitor pt.

## 2020-01-01 NOTE — ED Notes (Signed)
Pt back from CT

## 2020-01-01 NOTE — ED Provider Notes (Signed)
[facility] Provider Note   CSN: [id number] Arrival date & time: 01/01/20  0541     History Chief Complaint  Patient presents with  . Chest Pain    R[facility] is a 74 y.o. female.  74 year old female with complaint of not feeling well x 4 days. Patient states at onset on Wednesday, she felt "ill" and took a Doculax hoping to feel better after a bowel movement. Patient had a few loose stools as a result, also developed nausea with vomiting.  Denies abdominal pain, states she feels "sick" in her stomach. Patient to [facility] 12/30/19 and was diagnosed with gastroenteritis, given IV fluids, IV Zofran and Zofran ODT at discharge. Patient is taking with Zofran ODT, states she is not longer vomiting, is spitting up bile. Denies blood in emesis or stool, has not had a bowel movement since onset after taking the Doculax. Reports mid sternal chest discomfort onset yesterday, constant, mild, worse with vomiting, not worse with exertion/position changes, does not radiate. No associated SHOB, diaphoresis.   12/06/19 L3-L4 OLIF at [facility] with Dr. Patrice Paradise, wearing a back brace, no complications with surgery or recovery to this point.        Past Medical History:  Diagnosis Date  . Anemia   . Blood transfusion 2005  . Family history of breast cancer   . Family history of prostate cancer   . Hernia   . HH (hiatus hernia)   . Hyperlipidemia   . Hypertension   . PAH (pulmonary artery hypertension) ([place])   . Personal history of chemotherapy   . Personal history of radiation therapy   . Sarcoidosis   . Shortness of breath    on exertion  . Sleep apnea    does not use cpap  . Wheezing     Patient Active Problem List   Diagnosis Date Noted  . Genetic testing 06/14/2019  . Family history of prostate cancer   . Family history of breast cancer   . Port-A-Cath in place 08/02/2018  . Pre-op evaluation 06/24/2018  . Malignant neoplasm of upper-outer quadrant of  right breast in female, estrogen receptor negative ([place]) 06/07/2018  . Nausea & vomiting 11/21/2012  . Bilateral renal masses 11/21/2012  . Back pain, thoracic 08/14/2012  . Obstructive sleep apnea 05/09/2012  . GERD (gastroesophageal reflux disease) 06/28/2010  . HYPERLIPIDEMIA 12/09/2007  . Essential hypertension 12/09/2007  . Sarcoidosis 11/26/2007  . DYSPNEA 11/26/2007    Past Surgical History:  Procedure Laterality Date  . ABDOMINAL HYSTERECTOMY  1988  . BACK SURGERY  2005   lower  . BREAST LUMPECTOMY WITH RADIOACTIVE SEED AND SENTINEL LYMPH NODE BIOPSY Right 07/02/2018   Procedure: RIGHT BREAST RADIOACTIVE SEED LUMPECTOMY X2 AND RIGHT AXILLARY  SENTINEL LYMPH NODE BIOPSY;  Surgeon: [name], MD;  Location: [place];  Service: General;  Laterality: Right;  . ESOPHAGOGASTRODUODENOSCOPY  06/24/2012   Procedure: ESOPHAGOGASTRODUODENOSCOPY (EGD);  Surgeon: [name], MD;  Location: [name];  Service: General;  Laterality: N/A;  . HIATAL HERNIA REPAIR  08/18/2012   Procedure: LAPAROSCOPIC REPAIR OF HIATAL HERNIA;  Surgeon: [name], MD;  Location: [facility];  Service: General;  Laterality: N/A;  Laparoscopic Hiatal Hernia Repair   . INSERTION OF MESH  08/18/2012   Procedure: INSERTION OF MESH;  Surgeon: [name], MD;  Location: [facility];  Service: General;;  . IR IMAGING GUIDED PORT INSERTION  07/19/2018  . PORT-A-CATH REMOVAL Right 08/17/2019  Procedure: REMOVAL PORT-A-CATH;  Surgeon: Jovita Kussmaul, MD;  Location: [place];  Service: General;  Laterality: Right;  . PORTACATH PLACEMENT N/A 07/02/2018   Procedure: INSERTION PORT-A-CATH TRIED NO PORT IN PLACE;  Surgeon: [name], MD;  Location: [place];  Service: General;  Laterality: N/A;  . VESICOVAGINAL FISTULA CLOSURE W/ TAH       OB History   No obstetric history on file.     Family History  Problem Relation Age of Onset  . Clotting disorder Mother        PE  .  Heart disease Father   . Breast cancer Maternal Grandmother        spread to brain  . Coronary artery disease Other   . Prostate cancer Brother 32  . Breast cancer Maternal Aunt   . Cancer Maternal Aunt        unk type  . Prostate cancer Paternal Grandfather   . Prostate cancer Brother     Social History   Tobacco Use  . Smoking status: Never Smoker  . Smokeless tobacco: Never Used  Vaping Use  . Vaping Use: Never used  Substance Use Topics  . Alcohol use: No  . Drug use: No    Home Medications Prior to Admission medications   Medication Sig Start Date End Date Taking? Authorizing Provider  aspirin-sod bicarb-citric acid (ALKA-SELTZER) 325 MG TBEF tablet Take 325 mg by mouth every 6 (six) hours as needed.    [provider]  Cholecalciferol (VITAMIN D) 2000 UNITS CAPS Take 2,000 Units by mouth daily.     [provider]  Coenzyme Q10 200 MG TABS Take 200 mg by mouth daily.    [provider]  gabapentin (NEURONTIN) 100 MG capsule Take 1 capsule (100 mg total) by mouth at bedtime. 10/18/19   [name], [name], MD  hydrocortisone 2.5 % cream Apply 1 application topically 2 (two) times daily as needed (dry skin).    [provider]  ibuprofen (ADVIL,MOTRIN) 200 MG tablet Take 400 mg by mouth every 6 (six) hours as needed for headache or moderate pain.    [provider]  ketotifen (ALAWAY) 0.025 % ophthalmic solution Place 1 drop into both eyes 2 (two) times daily.    [provider]  loperamide (IMODIUM A-D) 2 MG capsule Take 2 mg by mouth daily as needed for diarrhea or loose stools.    [provider]  omeprazole (PRILOSEC) 20 MG capsule Take 20 mg by mouth daily.     [provider]  ondansetron (ZOFRAN ODT) 4 MG disintegrating tablet Take 1 tablet (4 mg total) by mouth every 8 (eight) hours as needed for nausea or vomiting. 12/30/19   Lamptey, Myrene Galas, MD  Polyvinyl Alcohol-Povidone PF (REFRESH) 1.4-0.6 %  SOLN Place 1 drop into both eyes daily as needed (dry eyes).     [provider]  potassium gluconate 595 MG TABS Take 595 mg by mouth daily.     [provider]  simvastatin (ZOCOR) 20 MG tablet Take 20 mg by mouth every morning.     [provider]  triamterene-hydrochlorothiazide (MAXZIDE-25) 37.5-25 MG per tablet Take 1 tablet by mouth every morning.     [provider]  prochlorperazine (COMPAZINE) 10 MG tablet Take 1 tablet (10 mg total) by mouth every 6 (six) hours as needed (Nausea or vomiting). 07/13/18 08/20/18  [name], [name], MD    Allergies    Shellfish allergy, Penicillins, and  Skin adhesives [cyanoacrylate]  Review of Systems   Review of Systems  Constitutional: Negative for chills, diaphoresis and fever.  Respiratory: Negative for chest tightness and shortness of breath.   Cardiovascular: Positive for chest pain. Negative for palpitations and leg swelling.  Gastrointestinal: Positive for abdominal pain, constipation, nausea and vomiting. Negative for diarrhea.  Genitourinary: Positive for decreased urine volume. Negative for dysuria.  Musculoskeletal: Negative for arthralgias, back pain and myalgias.  Skin: Negative for rash and wound.  Neurological: Positive for weakness.  All other systems reviewed and are negative.   Physical Exam Updated Vital Signs BP 138/83   Pulse 79   Temp 98.2 F (36.8 C) (Oral)   Resp 14   Ht 4\' 11"  (1.499 m)   Wt 76 kg   SpO2 98%   BMI 33.84 kg/m   Physical Exam Vitals and nursing note reviewed.  Constitutional:      General: She is not in acute distress.    Appearance: She is well-developed. She is not diaphoretic.  HENT:     Head: Normocephalic and atraumatic.     Mouth/Throat:     Mouth: Mucous membranes are dry.  Cardiovascular:     Rate and Rhythm: Regular rhythm. Tachycardia present.     Heart sounds: Normal heart sounds.  Pulmonary:     Effort: Pulmonary effort is normal.      Breath sounds: Normal breath sounds.  Chest:     Chest wall: No tenderness.  Abdominal:     Palpations: Abdomen is soft.     Tenderness: There is abdominal tenderness.  Musculoskeletal:     Right lower leg: No edema.     Left lower leg: No edema.  Skin:    General: Skin is warm and dry.  Neurological:     Mental Status: She is alert and oriented to person, place, and time.  Psychiatric:        Behavior: Behavior normal.     ED Results / Procedures / Treatments   Labs (all labs ordered are listed, but only abnormal results are displayed) Labs Reviewed  BASIC METABOLIC PANEL - Abnormal; Notable for the following components:      Result Value   Potassium 3.2 (*)    Glucose, Bld 140 (*)    BUN 25 (*)    Creatinine, Ser 1.30 (*)    GFR calc non Af Amer 40 (*)    GFR calc Af Amer 46 (*)    Anion gap 18 (*)    All other components within normal limits  SARS CORONAVIRUS 2 BY RT PCR (HOSPITAL ORDER, [facility])  CBC  PROTIME-INR  LIPASE, BLOOD  URINALYSIS, ROUTINE W REFLEX MICROSCOPIC  HEPATIC FUNCTION PANEL  TROPONIN I (HIGH SENSITIVITY)  TROPONIN I (HIGH SENSITIVITY)    EKG EKG Interpretation  Date/Time:  Sunday January 01 2020 09:45:30 EDT Ventricular Rate:  81 PR Interval:    QRS Duration: 89 QT Interval:  385 QTC Calculation: 447 R Axis:   -36 Text Interpretation: Sinus arrhythmia Left axis deviation No significant change since last tracing Confirmed by Isla Pence [phone]) on 01/01/2020 9:47:44 AM   Radiology DG Chest 2 View  Result Date: 01/01/2020 CLINICAL DATA:  Centralized chest pain. EXAM: CHEST - 2 VIEW COMPARISON:  09/07/2019; chest CT-01/15/2012 FINDINGS: Grossly unchanged borderline enlarged cardiac silhouette and mediastinal contours with calcified mediastinal and hilar lymph nodes. Redemonstrated mild diffuse slightly nodular thickening of the pulmonary interstitium. No discrete focal airspace opacities. No pleural  effusion  or pneumothorax. No evidence of edema. No acute osseous abnormalities. Post lower lumbar paraspinal fusion, incompletely evaluated. Stigmata of dish within the thoracic spine. Surgical clips overlie the inferolateral aspect of the right breast. IMPRESSION: 1. Chronic bronchitic change without superimposed acute cardiopulmonary disease. 2. Redemonstrated calcified mediastinal and hilar lymph nodes, likely the sequela of previous granulomatous infection. Electronically Signed   By: [name]   On: 01/01/2020 06:24   CT Abdomen Pelvis W Contrast  Result Date: 01/01/2020 CLINICAL DATA:  Abdominal pain and vomiting since this past Tuesday. EXAM: CT ABDOMEN AND PELVIS WITH CONTRAST TECHNIQUE: Multidetector CT imaging of the abdomen and pelvis was performed using the standard protocol following bolus administration of intravenous contrast. CONTRAST:  146mL OMNIPAQUE IOHEXOL 300 MG/ML  SOLN COMPARISON:  08/04/2019; 11/21/2012 FINDINGS: Lower chest: Limited visualization of the lower thorax is negative for focal airspace opacity or pleural effusion. Cardiomegaly.  No pericardial effusion. Hepatobiliary: Normal hepatic contour. There is mild diffuse decreased attenuation hepatic parenchyma on this postcontrast examination suggestive of hepatic steatosis. There is a minimal amount of focal fatty infiltration adjacent to the fissure for the ligamentum teres. Normal appearance of the gallbladder. No radiopaque gallstones. No intra or extrahepatic biliary ductal dilatation. No ascites. Pancreas: Normal appearance of the pancreas. Spleen: Normal appearance of the spleen. Adrenals/Urinary Tract: There is symmetric enhancement and excretion of the bilateral kidneys. There is an approximately 1.7 x 1.4 x 1.5 cm enhancing partially exophytic lesion arising from the interpolar lateral aspect of the right kidney (axial image 31, series 3; coronal image 59, series 6), as well as a 1.7 x 1.6 x 1.5 cm heterogeneously enhancing  lesion involving the inferior pole of the left kidney (axial image 37, series 3; coronal image 52, series 6) and approximately 1.3 x 1.3 x 0.9 cm partially enhancing lesion involving the interpolar aspect of the left kidney (axial image 31, series 3; coronal image 56, series 6), all of which appears similar to the 07/2018 examination and remain worrisome for renal cell carcinomas. While additional renal lesions are too small to accurately characterize several lesions appear to demonstrate enhancement and are worrisome for additional areas of renal cell carcinoma (representative coronal images 53 and 56, series 6). The bilateral renal veins appear patent. No definite evidence of nephrolithiasis on this postcontrast examination. No urine obstruction or perinephric stranding. Normal appearance the bilateral adrenal glands. Normal appearance of the urinary bladder given underdistention. Stomach/Bowel: There is moderate circumferential wall thickening involving the majority of the colon extending from the level of the hepatic flexure to the rectum, potentially accentuated due to underdistention though could be seen in the setting of an enteritis. No associated enteric obstruction. Otherwise, there are no discrete areas of bowel wall thickening. Normal appearance of the terminal ileum and the retrocecal appendix. Redemonstrated poor surgical change of the GE junction with persistent patulous distension of distal aspect of the esophagus. No pneumoperitoneum, pneumatosis or portal venous gas. Vascular/Lymphatic: Atherosclerotic plaque within a tortuous but normal caliber abdominal aorta. No bulky retroperitoneal, mesenteric, pelvic or inguinal lymphadenopathy. Reproductive: Post hysterectomy.  No discrete adnexal lesion. Other: Tiny mesenteric fat containing peri umbilical hernia. Musculoskeletal: No acute or aggressive osseous abnormalities. Post L4-L5 paraspinal fusion and intervertebral disc space replacement with left  lateral fusion and intervertebral disc space replacement of the L3-L4 vertebral body. No definite evidence of hardware failure or loosening. Stigmata of dish within thoracic spine. IMPRESSION: 1. Apparent circumferential wall thickening involving the majority of the colon, potentially  accentuated due to underdistention though conceivably an enteritis could have a similar appearance. No evidence of enteric obstruction. 2. Bilateral enhancing lesions (right renal lesion measuring 1.2 cm, left-sided renal lesions measuring 1.7 and 1.3 cm) worrisome for bilateral renal cell carcinomas, similar to dedicated renal protocol CT scan performed 07/2019. No evidence of renal vein involvement or additional metastasis. 3. Additional smaller (subcentimeter) though potentially enhancing bilateral renal lesions which are too small to accurately characterize though are also worrisome for additional renal cell carcinomas and warrant continued dedicated follow-up at the discretion of [name]. 4. Aortic Atherosclerosis (ICD10-I70.0). Electronically Signed   By: [name]   On: 01/01/2020 10:47    Procedures Procedures (including critical care time)  Medications Ordered in ED Medications  potassium chloride SA (KLOR-CON) CR tablet 40 mEq (has no administration in time range)  sodium chloride flush (NS) 0.9 % injection 3 mL (3 mLs Intravenous Given 01/01/20 0948)  sodium chloride 0.9 % bolus 500 mL (500 mLs Intravenous New Bag/Given 01/01/20 0947)  ondansetron (ZOFRAN) injection 4 mg (4 mg Intravenous Given 01/01/20 0948)  iohexol (OMNIPAQUE) 300 MG/ML solution 100 mL (100 mLs Intravenous Contrast Given 01/01/20 1017)    ED Course  I have reviewed the triage vital signs and the nursing notes.  Pertinent labs & imaging results that were available during my care of the patient were reviewed by me and considered in my medical decision making (see chart for details).  Clinical Course as of Jan 01 1135  Sun Jan 01, 2020  1059 CT Abdomen Pelvis W Contrast [LM]  3888 28MK female with complaint of not feeling well x 4 days with vomiting despite Zofran ODT. On exam, appears to not feel well, generally weak, left side abdominal tenderness.   EKG with LAD, no significant changes from prior. Troponin 6/5- no significant change, chest pain possibly related to her vomiting x 4 days/esophagitis.  Labs with no significant changes to CBC- normal WBC; BMP with mild hypokalemia with K of 3.2, will give oral Kdur. Increase in BUN from labs done at UC 2 days ago and increase in anion gap to 18 with normal Co2. Will add hepatic function. Lipase WNL. UA pending.  Ct returns with colitis, also findings concerning for renal cell carcinoma. Renal findings similar to prior scans, patient states her oncologist is aware and is monitoring this for the past 8 years.  Patient is feeling better after IV fluids and IV zofran. Case discussed with Dr. Gilford Raid, ER attending who has seen the patient, plan is to admit to hospitalist service for colitis with vomiting not well controlled with Zofran at home.    [LM]  1135 Case discussed with Dr. Tamala Julian with [facility] who will consult for admission.    [LM]    Clinical Course User Index [LM] Roque Lias   MDM Rules/Calculators/A&P                          Final Clinical Impression(s) / ED Diagnoses Final diagnoses:  Colitis  Hypokalemia  Bilateral renal masses    Rx / DC Orders ED Discharge Orders    None       Tacy Learn, PA-C 01/01/20 1401    Isla Pence, MD 01/01/20 1442

## 2020-01-02 ENCOUNTER — Other Ambulatory Visit: Payer: Self-pay | Admitting: Oncology

## 2020-01-02 DIAGNOSIS — K529 Noninfective gastroenteritis and colitis, unspecified: Secondary | ICD-10-CM | POA: Diagnosis not present

## 2020-01-02 LAB — COMPREHENSIVE METABOLIC PANEL
ALT: 12 U/L (ref 0–44)
AST: 16 U/L (ref 15–41)
Albumin: 3.2 g/dL — ABNORMAL LOW (ref 3.5–5.0)
Alkaline Phosphatase: 59 U/L (ref 38–126)
Anion gap: 11 (ref 5–15)
BUN: 20 mg/dL (ref 8–23)
CO2: 30 mmol/L (ref 22–32)
Calcium: 9.1 mg/dL (ref 8.9–10.3)
Chloride: 101 mmol/L (ref 98–111)
Creatinine, Ser: 1.11 mg/dL — ABNORMAL HIGH (ref 0.44–1.00)
GFR calc Af Amer: 56 mL/min — ABNORMAL LOW (ref >60)
GFR calc non Af Amer: 49 mL/min — ABNORMAL LOW (ref >60)
Glucose, Bld: 119 mg/dL — ABNORMAL HIGH (ref 70–99)
Potassium: 3.2 mmol/L — ABNORMAL LOW (ref 3.5–5.1)
Sodium: 142 mmol/L (ref 135–145)
Total Bilirubin: 1 mg/dL (ref 0.3–1.2)
Total Protein: 6.6 g/dL (ref 6.5–8.1)

## 2020-01-02 LAB — CBC WITH DIFFERENTIAL/PLATELET
Abs Immature Granulocytes: 0.05 10*3/uL (ref 0.00–0.07)
Basophils Absolute: 0 10*3/uL (ref 0.0–0.1)
Basophils Relative: 0 %
Eosinophils Absolute: 0 10*3/uL (ref 0.0–0.5)
Eosinophils Relative: 0 %
HCT: 36 % (ref 36.0–46.0)
Hemoglobin: 11.7 g/dL — ABNORMAL LOW (ref 12.0–15.0)
Immature Granulocytes: 1 %
Lymphocytes Relative: 14 %
Lymphs Abs: 1.2 10*3/uL (ref 0.7–4.0)
MCH: 30.4 pg (ref 26.0–34.0)
MCHC: 32.5 g/dL (ref 30.0–36.0)
MCV: 93.5 fL (ref 80.0–100.0)
Monocytes Absolute: 0.8 10*3/uL (ref 0.1–1.0)
Monocytes Relative: 9 %
Neutro Abs: 6.3 10*3/uL (ref 1.7–7.7)
Neutrophils Relative %: 76 %
Platelets: 173 10*3/uL (ref 150–400)
RBC: 3.85 MIL/uL — ABNORMAL LOW (ref 3.87–5.11)
RDW: 12.4 % (ref 11.5–15.5)
WBC: 8.3 10*3/uL (ref 4.0–10.5)
nRBC: 0 % (ref 0.0–0.2)

## 2020-01-02 MED ORDER — POTASSIUM CHLORIDE CRYS ER 20 MEQ PO TBCR
40.0000 meq | EXTENDED_RELEASE_TABLET | Freq: Once | ORAL | Status: AC
  Administered 2020-01-02: 40 meq via ORAL
  Filled 2020-01-02: qty 2

## 2020-01-02 MED ORDER — POLYETHYLENE GLYCOL 3350 17 G PO PACK: 17.0000 g | PACK | Freq: Every day | ORAL | Status: DC | PRN

## 2020-01-02 MED ORDER — SODIUM CHLORIDE 0.9 % IV SOLN: INTRAVENOUS | Status: AC

## 2020-01-02 MED ORDER — PANTOPRAZOLE SODIUM 40 MG IV SOLR
40.0000 mg | Freq: Two times a day (BID) | INTRAVENOUS | Status: DC
  Administered 2020-01-02 – 2020-01-05 (×7): 40 mg via INTRAVENOUS
  Filled 2020-01-02 (×7): qty 40

## 2020-01-02 MED ORDER — POTASSIUM CHLORIDE 20 MEQ/15ML (10%) PO SOLN
40.0000 meq | Freq: Once | ORAL | Status: AC
  Administered 2020-01-02: 40 meq via ORAL
  Filled 2020-01-02: qty 30

## 2020-01-02 NOTE — Progress Notes (Addendum)
PROGRESS NOTE    Rachel Kelly  OEV:[id number] DOB: [birth year] DOA: 01/01/2020 PCP: [name], MD  Brief Narrative: 74 year old female with history of hypertension, hyperlipidemia, breast cancer s/p right lumpectomy, anemia, pulmonary artery hypertension, chronic kidney disease stage IIIb, and OSA not on CPAP.  She presents with complaints of persistent vomiting over the last 3-4 days. -Patient reports coffee-ground emesis in the last few days.  She did briefly have mild diarrhea initially which has since resolved.  She denies any melena but does report that her stool was darker than usual. -Denies any NSAID use despite ibuprofen being listed in her MAR -Work-up in the emergency room noted white count of 10 K, creatinine of 1.3, high-sensitivity troponin was negative, chest x-ray was unremarkable, CT abdomen pelvis noted some circumferential thickening of but: And bilateral enhancing renal lesions as noted on previous scans. -She was seen in the ED 6/11 and urgent care in the past week for this   Assessment & Plan:   Nausea vomiting -Initially did have some diarrhea briefly last week which has since resolved -Now with coffee-ground emesis, persistent vomiting -Hemoglobin is relatively stable, add IV PPI, request gastroenterology input, patient reports endoscopy by Dr. Michail Sermon in the last 2 years -CT with some findings of questionable enteritis vs underdistension, she was started on Cipro Flagyl yesterday on admission for this -Continue IV fluids today  Chronic kidney disease stage IIIb -Creatinine is stable, hold NSAIDs, continue IV fluids today  Chronic diastolic CHF Pulmonary hypertension -Diuretics on hold, gentle IV fluids for today  History of breast cancer -Prior lumpectomy, chemo/adjunctive radiation -Under surveillance, followed by [name]  Bilateral renal masses -Followed by [name] for several years now  GERD -Now on IV PPI  Recent back surgery -PT OT,  follow-up with Dr.Cohen    DVT prophylaxis: SCDs Code Status: Full code Family Communication: Discussed patient in detail, no family at bedside Disposition Plan:  Status is: Observation  The patient will require care spanning > 2 midnights and should be moved to inpatient because: IV treatments appropriate due to intensity of illness or inability to take PO  Dispo: The patient is from: Home              Anticipated d/c is to: Home              Anticipated d/c date is: 2 days              Patient currently is not medically stable to d/c.  Consultants:   [facility]   Procedures:   Antimicrobials:    Subjective: -Feels sick and nauseated, vomited again this morning  Objective: Vitals:   01/01/20 1315 01/01/20 2048 01/02/20 0151 01/02/20 0425  BP: (!) 143/76 133/70 116/67 116/62  Pulse: 80 82 81 76  Resp: 18 18 16 18   Temp: 98.9 F (37.2 C) 99.5 F (37.5 C) 99 F (37.2 C) 98.1 F (36.7 C)  TempSrc:  Oral Oral Oral  SpO2: 98% 94% 94% 92%  Weight:      Height:        Intake/Output Summary (Last 24 hours) at 01/02/2020 1031 Last data filed at 01/01/2020 1700 Gross per 24 hour  Intake 238.56 ml  Output --  Net 238.56 ml   Filed Weights   01/01/20 0547  Weight: 76 kg    Examination:  General exam: Elderly chronically ill female sitting up in bed, uncomfortable, AAOx3 Respiratory system: Clear  cardiovascular system: S1 & S2 heard, RRR. Gastrointestinal  system: Abdomen is nondistended, soft and nontender.Normal bowel sounds heard. Central nervous system: Alert and oriented. No focal neurological deficits. Extremities: No edema Skin: No rashes on exposed skin Psychiatry: Mood & affect appropriate.     Data Reviewed:   CBC: Recent Labs  Lab 12/30/19 1552 01/01/20 0558 01/02/20 0444  WBC 9.0 10.0 8.3  NEUTROABS 7.6  --  6.3  HGB 13.5 13.5 11.7*  HCT 40.9 40.9 36.0  MCV 92.1 93.6 93.5  PLT 246 220 196   Basic Metabolic Panel: Recent Labs  Lab  12/30/19 1552 01/01/20 0558 01/02/20 0444  NA 139 143 142  K 4.5 3.2* 3.2*  CL 100 100 101  CO2 25 25 30   GLUCOSE 114* 140* 119*  BUN 22 25* 20  CREATININE 1.47* 1.30* 1.11*  CALCIUM 10.1 9.6 9.1   GFR: Estimated Creatinine Clearance: 38.9 mL/min (A) (by C-G formula based on SCr of 1.11 mg/dL (H)). Liver Function Tests: Recent Labs  Lab 12/30/19 1552 01/01/20 0942 01/02/20 0444  AST 27 22 16   ALT 16 15 12   ALKPHOS 102 76 59  BILITOT 1.2 1.0 1.0  PROT 8.2* 8.0 6.6  ALBUMIN 4.2 4.2 3.2*   Recent Labs  Lab 01/01/20 0942  LIPASE 19   No results for input(s): AMMONIA in the last 168 hours. Coagulation Profile: Recent Labs  Lab 01/01/20 0558  INR 1.2   Cardiac Enzymes: No results for input(s): CKTOTAL, CKMB, CKMBINDEX, TROPONINI in the last 168 hours. BNP (last 3 results) No results for input(s): PROBNP in the last 8760 hours. HbA1C: No results for input(s): HGBA1C in the last 72 hours. CBG: No results for input(s): GLUCAP in the last 168 hours. Lipid Profile: No results for input(s): CHOL, HDL, LDLCALC, TRIG, CHOLHDL, LDLDIRECT in the last 72 hours. Thyroid Function Tests: No results for input(s): TSH, T4TOTAL, FREET4, T3FREE, THYROIDAB in the last 72 hours. Anemia Panel: No results for input(s): VITAMINB12, FOLATE, FERRITIN, TIBC, IRON, RETICCTPCT in the last 72 hours. Urine analysis:    Component Value Date/Time   COLORURINE STRAW (A) 07/27/2018 1050   APPEARANCEUR CLEAR 07/27/2018 1050   LABSPEC 1.020 08/05/2019 1155   PHURINE 6.0 08/05/2019 1155   GLUCOSEU NEGATIVE 08/05/2019 1155   HGBUR MODERATE (A) 08/05/2019 1155   BILIRUBINUR NEGATIVE 08/05/2019 1155   KETONESUR NEGATIVE 08/05/2019 1155   PROTEINUR 30 (A) 08/05/2019 1155   UROBILINOGEN 0.2 08/05/2019 1155   NITRITE POSITIVE (A) 08/05/2019 1155   LEUKOCYTESUR LARGE (A) 08/05/2019 1155   Sepsis Labs: @LABRCNTIP (procalcitonin:4,lacticidven:4)  ) Recent Results (from the past 240 hour(s))    SARS Coronavirus 2 by RT PCR (hospital order, performed in [place] hospital lab) Nasopharyngeal Nasopharyngeal Swab     Status: None   Collection Time: 01/01/20 12:03 PM   Specimen: Nasopharyngeal Swab  Result Value Ref Range Status   SARS Coronavirus 2 NEGATIVE NEGATIVE Final    Comment: (NOTE) SARS-CoV-2 target nucleic acids are NOT DETECTED.  The SARS-CoV-2 RNA is generally detectable in upper and lower respiratory specimens during the acute phase of infection. The lowest concentration of SARS-CoV-2 viral copies this assay can detect is 250 copies / mL. A negative result does not preclude SARS-CoV-2 infection and should not be used as the sole basis for treatment or other patient management decisions.  A negative result may occur with improper specimen collection / handling, submission of specimen other than nasopharyngeal swab, presence of viral mutation(s) within the areas targeted by this assay, and inadequate number of viral copies (<250  copies / mL). A negative result must be combined with clinical observations, patient history, and epidemiological information.  Fact Sheet for Patients:   StrictlyIdeas.no  Fact Sheet for Healthcare Providers: BankingDealers.co.za  This test is not yet approved or  cleared by the [place] FDA and has been authorized for detection and/or diagnosis of SARS-CoV-2 by FDA under an Emergency Use Authorization (EUA).  This EUA will remain in effect (meaning this test can be used) for the duration of the COVID-19 declaration under Section 564(b)(1) of the Act, 21 U.S.C. section 360bbb-3(b)(1), unless the authorization is terminated or revoked sooner.  Performed at [facility], [place] [address] [place], [place] [address]          Radiology Studies: DG Chest 2 View  Result Date: 01/01/2020 CLINICAL DATA:  Centralized chest pain. EXAM: CHEST - 2 VIEW COMPARISON:  09/07/2019;  chest CT-01/15/2012 FINDINGS: Grossly unchanged borderline enlarged cardiac silhouette and mediastinal contours with calcified mediastinal and hilar lymph nodes. Redemonstrated mild diffuse slightly nodular thickening of the pulmonary interstitium. No discrete focal airspace opacities. No pleural effusion or pneumothorax. No evidence of edema. No acute osseous abnormalities. Post lower lumbar paraspinal fusion, incompletely evaluated. Stigmata of dish within the thoracic spine. Surgical clips overlie the inferolateral aspect of the right breast. IMPRESSION: 1. Chronic bronchitic change without superimposed acute cardiopulmonary disease. 2. Redemonstrated calcified mediastinal and hilar lymph nodes, likely the sequela of previous granulomatous infection. Electronically Signed   By: [name]   On: 01/01/2020 06:24   CT Abdomen Pelvis W Contrast  Result Date: 01/01/2020 CLINICAL DATA:  Abdominal pain and vomiting since this past Tuesday. EXAM: CT ABDOMEN AND PELVIS WITH CONTRAST TECHNIQUE: Multidetector CT imaging of the abdomen and pelvis was performed using the standard protocol following bolus administration of intravenous contrast. CONTRAST:  185mL OMNIPAQUE IOHEXOL 300 MG/ML  SOLN COMPARISON:  08/04/2019; 11/21/2012 FINDINGS: Lower chest: Limited visualization of the lower thorax is negative for focal airspace opacity or pleural effusion. Cardiomegaly.  No pericardial effusion. Hepatobiliary: Normal hepatic contour. There is mild diffuse decreased attenuation hepatic parenchyma on this postcontrast examination suggestive of hepatic steatosis. There is a minimal amount of focal fatty infiltration adjacent to the fissure for the ligamentum teres. Normal appearance of the gallbladder. No radiopaque gallstones. No intra or extrahepatic biliary ductal dilatation. No ascites. Pancreas: Normal appearance of the pancreas. Spleen: Normal appearance of the spleen. Adrenals/Urinary Tract: There is symmetric  enhancement and excretion of the bilateral kidneys. There is an approximately 1.7 x 1.4 x 1.5 cm enhancing partially exophytic lesion arising from the interpolar lateral aspect of the right kidney (axial image 31, series 3; coronal image 59, series 6), as well as a 1.7 x 1.6 x 1.5 cm heterogeneously enhancing lesion involving the inferior pole of the left kidney (axial image 37, series 3; coronal image 52, series 6) and approximately 1.3 x 1.3 x 0.9 cm partially enhancing lesion involving the interpolar aspect of the left kidney (axial image 31, series 3; coronal image 56, series 6), all of which appears similar to the 07/2018 examination and remain worrisome for renal cell carcinomas. While additional renal lesions are too small to accurately characterize several lesions appear to demonstrate enhancement and are worrisome for additional areas of renal cell carcinoma (representative coronal images 53 and 56, series 6). The bilateral renal veins appear patent. No definite evidence of nephrolithiasis on this postcontrast examination. No urine obstruction or perinephric stranding. Normal appearance the bilateral adrenal glands. Normal appearance of  the urinary bladder given underdistention. Stomach/Bowel: There is moderate circumferential wall thickening involving the majority of the colon extending from the level of the hepatic flexure to the rectum, potentially accentuated due to underdistention though could be seen in the setting of an enteritis. No associated enteric obstruction. Otherwise, there are no discrete areas of bowel wall thickening. Normal appearance of the terminal ileum and the retrocecal appendix. Redemonstrated poor surgical change of the GE junction with persistent patulous distension of distal aspect of the esophagus. No pneumoperitoneum, pneumatosis or portal venous gas. Vascular/Lymphatic: Atherosclerotic plaque within a tortuous but normal caliber abdominal aorta. No bulky retroperitoneal,  mesenteric, pelvic or inguinal lymphadenopathy. Reproductive: Post hysterectomy.  No discrete adnexal lesion. Other: Tiny mesenteric fat containing peri umbilical hernia. Musculoskeletal: No acute or aggressive osseous abnormalities. Post L4-L5 paraspinal fusion and intervertebral disc space replacement with left lateral fusion and intervertebral disc space replacement of the L3-L4 vertebral body. No definite evidence of hardware failure or loosening. Stigmata of dish within thoracic spine. IMPRESSION: 1. Apparent circumferential wall thickening involving the majority of the colon, potentially accentuated due to underdistention though conceivably an enteritis could have a similar appearance. No evidence of enteric obstruction. 2. Bilateral enhancing lesions (right renal lesion measuring 1.2 cm, left-sided renal lesions measuring 1.7 and 1.3 cm) worrisome for bilateral renal cell carcinomas, similar to dedicated renal protocol CT scan performed 07/2019. No evidence of renal vein involvement or additional metastasis. 3. Additional smaller (subcentimeter) though potentially enhancing bilateral renal lesions which are too small to accurately characterize though are also worrisome for additional renal cell carcinomas and warrant continued dedicated follow-up at the discretion of [name]. 4. Aortic Atherosclerosis (ICD10-I70.0). Electronically Signed   By: [name]   On: 01/01/2020 10:47        Scheduled Meds: . acidophilus  2 capsule Oral Daily  . enoxaparin (LOVENOX) injection  40 mg Subcutaneous Q24H  . gabapentin  100 mg Oral QHS  . pantoprazole (PROTONIX) IV  40 mg Intravenous Q12H  . potassium chloride  40 mEq Oral Once  . simvastatin  20 mg Oral q morning - 10a  . sodium chloride flush  3 mL Intravenous Q12H   Continuous Infusions: . sodium chloride    . ciprofloxacin 400 mg (01/02/20 0630)  . metronidazole 500 mg (01/02/20 0213)     LOS: 0 days    Time spent: 9min  Domenic Polite, MD [facility] 01/02/2020, 10:31 AM

## 2020-01-02 NOTE — Consult Note (Signed)
Referring Provider: Dr. Domenic Polite Primary Care Physician:  [name], MD Primary Gastroenterologist:  Dr. Michail Sermon [facility])  Reason for Consultation: Intractable nausea and vomiting, hematemesis  HPI: Rachel Kelly is a 74 y.o. female with history pertinent for hiatal hernia (s/p repair 07/2012), reflux esophagitis, breast cancer (s/p right lumpectomy), pulmonary hypertension, CKD stage III, and OSA presenting with intractable nausea and vomiting and hematemesis.  Patient reports that last week around Tuesday 6/8, she started having nausea and vomiting.  This has persisted, which caused her to present to the ED.  Prior to admission, she reports several episodes of brown, thick emesis concerning for coffee-ground emesis.  However, she states that since admission, the emesis is mostly clear.  She has not seen any red emesis.  She also had one softer, brown stool but denies any melena or hematochezia.  She has been constipated over the last several days.  Patient endorses odynophagia but denies any dysphagia.  Also endorses some chest pain but denies any abdominal pain.  Denies any shortness of breath.  Prior to this, she endorses a normal appetite.  She does report that she lost weight after having back surgery in May.  Reports intermittent ibuprofen use but this is infrequent.  Denies aspirin or anticoagulation use.  She denies any family history of gastrointestinal malignancies.  Last EGD was in 01/2013 and showed esophageal mucosal changes concerning for Barrett's (bx: chronically inflamed mucosa, negative for Barrett's, dysplasia, malignancy). Last colonoscopy was 12/2018 and showed diverticulosis and internal hemorrhoids, otherwise normal, no repeat recommended due to age.  Past Medical History:  Diagnosis Date  . Anemia   . Blood transfusion 2005  . Family history of breast cancer   . Family history of prostate cancer   . Hernia   . HH (hiatus hernia)   . Hyperlipidemia   .  Hypertension   . PAH (pulmonary artery hypertension) ([place])   . Personal history of chemotherapy   . Personal history of radiation therapy   . Sarcoidosis   . Shortness of breath    on exertion  . Sleep apnea    does not use cpap  . Wheezing     Past Surgical History:  Procedure Laterality Date  . ABDOMINAL HYSTERECTOMY  1988  . BACK SURGERY  2005   lower  . BREAST LUMPECTOMY WITH RADIOACTIVE SEED AND SENTINEL LYMPH NODE BIOPSY Right 07/02/2018   Procedure: RIGHT BREAST RADIOACTIVE SEED LUMPECTOMY X2 AND RIGHT AXILLARY  SENTINEL LYMPH NODE BIOPSY;  Surgeon: [name], MD;  Location: [place];  Service: General;  Laterality: Right;  . ESOPHAGOGASTRODUODENOSCOPY  06/24/2012   Procedure: ESOPHAGOGASTRODUODENOSCOPY (EGD);  Surgeon: [name], MD;  Location: [name];  Service: General;  Laterality: N/A;  . HIATAL HERNIA REPAIR  08/18/2012   Procedure: LAPAROSCOPIC REPAIR OF HIATAL HERNIA;  Surgeon: [name], MD;  Location: [facility];  Service: General;  Laterality: N/A;  Laparoscopic Hiatal Hernia Repair   . INSERTION OF MESH  08/18/2012   Procedure: INSERTION OF MESH;  Surgeon: [name], MD;  Location: [facility];  Service: General;;  . IR IMAGING GUIDED PORT INSERTION  07/19/2018  . PORT-A-CATH REMOVAL Right 08/17/2019   Procedure: REMOVAL PORT-A-CATH;  Surgeon: Jovita Kussmaul, MD;  Location: [place];  Service: General;  Laterality: Right;  . PORTACATH PLACEMENT N/A 07/02/2018   Procedure: INSERTION PORT-A-CATH TRIED NO PORT IN PLACE;  Surgeon: [name], MD;  Location: [facility];  Service: General;  Laterality:  N/A;  . VESICOVAGINAL FISTULA CLOSURE W/ TAH      Prior to Admission medications   Medication Sig Start Date End Date Taking? Authorizing Provider  aspirin-sod bicarb-citric acid (ALKA-SELTZER) 325 MG TBEF tablet Take 325 mg by mouth every 6 (six) hours as needed (cold symptoms).    Yes [provider]  Coenzyme Q10  200 MG TABS Take 200 mg by mouth daily.   Yes [provider]  hydrocortisone 2.5 % cream Apply 1 application topically 2 (two) times daily as needed (dry skin).   Yes [provider]  ibuprofen (ADVIL,MOTRIN) 200 MG tablet Take 400 mg by mouth every 6 (six) hours as needed for headache or moderate pain.   Yes [provider]  ketotifen (ALAWAY) 0.025 % ophthalmic solution Place 1 drop into both eyes 2 (two) times daily as needed (itchy, dry eyes).    Yes [provider]  loperamide (IMODIUM A-D) 2 MG capsule Take 2 mg by mouth daily as needed for diarrhea or loose stools.   Yes [provider]  methocarbamol (ROBAXIN) 500 MG tablet Take 500 mg by mouth 3 (three) times daily as needed for pain. 12/07/19  Yes [provider]  omeprazole (PRILOSEC) 20 MG capsule Take 20 mg by mouth daily.    Yes [provider]  ondansetron (ZOFRAN ODT) 4 MG disintegrating tablet Take 1 tablet (4 mg total) by mouth every 8 (eight) hours as needed for nausea or vomiting. 12/30/19  Yes Lamptey, Myrene Galas, MD  Polyvinyl Alcohol-Povidone PF (REFRESH) 1.4-0.6 % SOLN Place 1 drop into both eyes daily as needed (dry eyes).    Yes [provider]  potassium gluconate 595 MG TABS Take 595 mg by mouth daily.    Yes [provider]  simvastatin (ZOCOR) 20 MG tablet Take 20 mg by mouth every morning.    Yes [provider]  triamterene-hydrochlorothiazide (MAXZIDE-25) 37.5-25 MG per tablet Take 1 tablet by mouth every morning.    Yes [provider]  gabapentin (NEURONTIN) 100 MG capsule Take 1 capsule (100 mg total) by mouth at bedtime. 10/18/19   [name], [name], MD  prochlorperazine (COMPAZINE) 10 MG tablet Take 1 tablet (10 mg total) by mouth every 6 (six) hours as needed (Nausea or vomiting). 07/13/18 08/20/18  [name], [name], MD    Scheduled Meds: . acidophilus  2 capsule Oral Daily  . gabapentin  100 mg Oral QHS  .  pantoprazole (PROTONIX) IV  40 mg Intravenous Q12H  . potassium chloride  40 mEq Oral Once  . simvastatin  20 mg Oral q morning - 10a  . sodium chloride flush  3 mL Intravenous Q12H   Continuous Infusions: . sodium chloride    . ciprofloxacin 400 mg (01/02/20 0630)  . metronidazole 500 mg (01/02/20 0213)   PRN Meds:.acetaminophen **OR** acetaminophen, albuterol, hydrocortisone cream, ketotifen, loperamide, methocarbamol, ondansetron **OR** ondansetron (ZOFRAN) IV, polyvinyl alcohol  Allergies as of 01/01/2020 - Review Complete 01/01/2020  Allergen Reaction Noted  . Shellfish allergy Other (See Comments) 08/11/2019  . Enbucrilate Itching and Rash 09/09/2012  . Penicillins Rash   . Skin adhesives [cyanoacrylate] Itching and Rash 09/09/2012    Family History  Problem Relation Age of Onset  . Clotting disorder Mother        PE  . Heart disease Father   . Breast cancer Maternal Grandmother        spread to brain  . Coronary artery disease Other   . Prostate cancer  Brother 19  . Breast cancer Maternal Aunt   . Cancer Maternal Aunt        unk type  . Prostate cancer Paternal Grandfather   . Prostate cancer Brother     Social History   Socioeconomic History  . Marital status: Single    Spouse name: Not on file  . Number of children: Not on file  . Years of education: Not on file  . Highest education level: Not on file  Occupational History  . Occupation: Retired Pharmacist, hospital  Tobacco Use  . Smoking status: Never Smoker  . Smokeless tobacco: Never Used  Vaping Use  . Vaping Use: Never used  Substance and Sexual Activity  . Alcohol use: No  . Drug use: No  . Sexual activity: Not on file  Other Topics Concern  . Not on file  Social History Narrative  . Not on file   Social Determinants of Health   Financial Resource Strain:   . Difficulty of Paying Living Expenses:   Food Insecurity:   . Worried About Charity fundraiser in the Last Year:   . Arboriculturist in the  Last Year:   Transportation Needs:   . Film/video editor (Medical):   Marland Kitchen Lack of Transportation (Non-Medical):   Physical Activity:   . Days of Exercise per Week:   . Minutes of Exercise per Session:   Stress:   . Feeling of Stress :   Social Connections:   . Frequency of Communication with Friends and Family:   . Frequency of Social Gatherings with Friends and Family:   . Attends Religious Services:   . Active Member of Clubs or Organizations:   . Attends Archivist Meetings:   Marland Kitchen Marital Status:   Intimate Partner Violence:   . Fear of Current or Ex-Partner:   . Emotionally Abused:   Marland Kitchen Physically Abused:   . Sexually Abused:     Review of Systems: Review of Systems  Constitutional: Positive for weight loss. Negative for chills and fever.  HENT: Positive for sore throat.   Eyes: Negative for pain and redness.  Respiratory: Negative for cough, shortness of breath and stridor.   Cardiovascular: Positive for chest pain. Negative for palpitations.  Gastrointestinal: Positive for heartburn, nausea and vomiting. Negative for abdominal pain, blood in stool, constipation, diarrhea and melena.  Genitourinary: Negative for flank pain and hematuria.  Musculoskeletal: Negative for falls and joint pain.  Skin: Negative for itching and rash.  Neurological: Negative for seizures and loss of consciousness.  Endo/Heme/Allergies: Negative for polydipsia. Does not bruise/bleed easily.  Psychiatric/Behavioral: Negative for substance abuse. The patient is not nervous/anxious.     Physical Exam: Vital signs: Vitals:   01/02/20 0151 01/02/20 0425  BP: 116/67 116/62  Pulse: 81 76  Resp: 16 18  Temp: 99 F (37.2 C) 98.1 F (36.7 C)  SpO2: 94% 92%     Physical Exam Vitals reviewed.  Constitutional:      General: She is not in acute distress.    Appearance: Normal appearance.  HENT:     Head: Normocephalic and atraumatic.     Mouth/Throat:     Mouth: Mucous membranes are  moist.     Pharynx: Oropharynx is clear.  Eyes:     General: No scleral icterus.    Extraocular Movements: Extraocular movements intact.     Conjunctiva/sclera: Conjunctivae normal.  Cardiovascular:     Rate and Rhythm: Normal rate and regular rhythm.  Pulses: Normal pulses.     Heart sounds: Normal heart sounds.  Pulmonary:     Effort: Pulmonary effort is normal. No respiratory distress.     Breath sounds: Normal breath sounds.  Abdominal:     General: Bowel sounds are normal. There is no distension.     Palpations: Abdomen is soft. There is no mass.     Tenderness: There is abdominal tenderness (mild, epigastric). There is no guarding or rebound.     Hernia: No hernia is present.  Musculoskeletal:        General: No swelling or tenderness.     Cervical back: Normal range of motion and neck supple.     Right lower leg: No edema.     Left lower leg: No edema.  Skin:    General: Skin is warm and dry.  Neurological:     Mental Status: She is oriented to person, place, and time. She is lethargic.  Psychiatric:        Mood and Affect: Mood normal.        Behavior: Behavior normal.     GI:  Lab Results: Recent Labs    12/30/19 1552 01/01/20 0558 01/02/20 0444  WBC 9.0 10.0 8.3  HGB 13.5 13.5 11.7*  HCT 40.9 40.9 36.0  PLT 246 220 173   BMET Recent Labs    12/30/19 1552 01/01/20 0558 01/02/20 0444  NA 139 143 142  K 4.5 3.2* 3.2*  CL 100 100 101  CO2 25 25 30   GLUCOSE 114* 140* 119*  BUN 22 25* 20  CREATININE 1.47* 1.30* 1.11*  CALCIUM 10.1 9.6 9.1   LFT Recent Labs    01/01/20 0942 01/01/20 0942 01/02/20 0444  PROT 8.0   < > 6.6  ALBUMIN 4.2   < > 3.2*  AST 22   < > 16  ALT 15   < > 12  ALKPHOS 76   < > 59  BILITOT 1.0   < > 1.0  BILIDIR 0.1  --   --   IBILI 0.9  --   --    < > = values in this interval not displayed.   PT/INR Recent Labs    01/01/20 0558  LABPROT 15.0  INR 1.2     Studies/Results: DG Chest 2 View  Result Date:  01/01/2020 CLINICAL DATA:  Centralized chest pain. EXAM: CHEST - 2 VIEW COMPARISON:  09/07/2019; chest CT-01/15/2012 FINDINGS: Grossly unchanged borderline enlarged cardiac silhouette and mediastinal contours with calcified mediastinal and hilar lymph nodes. Redemonstrated mild diffuse slightly nodular thickening of the pulmonary interstitium. No discrete focal airspace opacities. No pleural effusion or pneumothorax. No evidence of edema. No acute osseous abnormalities. Post lower lumbar paraspinal fusion, incompletely evaluated. Stigmata of dish within the thoracic spine. Surgical clips overlie the inferolateral aspect of the right breast. IMPRESSION: 1. Chronic bronchitic change without superimposed acute cardiopulmonary disease. 2. Redemonstrated calcified mediastinal and hilar lymph nodes, likely the sequela of previous granulomatous infection. Electronically Signed   By: [name]   On: 01/01/2020 06:24   CT Abdomen Pelvis W Contrast  Result Date: 01/01/2020 CLINICAL DATA:  Abdominal pain and vomiting since this past Tuesday. EXAM: CT ABDOMEN AND PELVIS WITH CONTRAST TECHNIQUE: Multidetector CT imaging of the abdomen and pelvis was performed using the standard protocol following bolus administration of intravenous contrast. CONTRAST:  166mL OMNIPAQUE IOHEXOL 300 MG/ML  SOLN COMPARISON:  08/04/2019; 11/21/2012 FINDINGS: Lower chest: Limited visualization of the lower thorax is negative for  focal airspace opacity or pleural effusion. Cardiomegaly.  No pericardial effusion. Hepatobiliary: Normal hepatic contour. There is mild diffuse decreased attenuation hepatic parenchyma on this postcontrast examination suggestive of hepatic steatosis. There is a minimal amount of focal fatty infiltration adjacent to the fissure for the ligamentum teres. Normal appearance of the gallbladder. No radiopaque gallstones. No intra or extrahepatic biliary ductal dilatation. No ascites. Pancreas: Normal appearance of the  pancreas. Spleen: Normal appearance of the spleen. Adrenals/Urinary Tract: There is symmetric enhancement and excretion of the bilateral kidneys. There is an approximately 1.7 x 1.4 x 1.5 cm enhancing partially exophytic lesion arising from the interpolar lateral aspect of the right kidney (axial image 31, series 3; coronal image 59, series 6), as well as a 1.7 x 1.6 x 1.5 cm heterogeneously enhancing lesion involving the inferior pole of the left kidney (axial image 37, series 3; coronal image 52, series 6) and approximately 1.3 x 1.3 x 0.9 cm partially enhancing lesion involving the interpolar aspect of the left kidney (axial image 31, series 3; coronal image 56, series 6), all of which appears similar to the 07/2018 examination and remain worrisome for renal cell carcinomas. While additional renal lesions are too small to accurately characterize several lesions appear to demonstrate enhancement and are worrisome for additional areas of renal cell carcinoma (representative coronal images 53 and 56, series 6). The bilateral renal veins appear patent. No definite evidence of nephrolithiasis on this postcontrast examination. No urine obstruction or perinephric stranding. Normal appearance the bilateral adrenal glands. Normal appearance of the urinary bladder given underdistention. Stomach/Bowel: There is moderate circumferential wall thickening involving the majority of the colon extending from the level of the hepatic flexure to the rectum, potentially accentuated due to underdistention though could be seen in the setting of an enteritis. No associated enteric obstruction. Otherwise, there are no discrete areas of bowel wall thickening. Normal appearance of the terminal ileum and the retrocecal appendix. Redemonstrated poor surgical change of the GE junction with persistent patulous distension of distal aspect of the esophagus. No pneumoperitoneum, pneumatosis or portal venous gas. Vascular/Lymphatic:  Atherosclerotic plaque within a tortuous but normal caliber abdominal aorta. No bulky retroperitoneal, mesenteric, pelvic or inguinal lymphadenopathy. Reproductive: Post hysterectomy.  No discrete adnexal lesion. Other: Tiny mesenteric fat containing peri umbilical hernia. Musculoskeletal: No acute or aggressive osseous abnormalities. Post L4-L5 paraspinal fusion and intervertebral disc space replacement with left lateral fusion and intervertebral disc space replacement of the L3-L4 vertebral body. No definite evidence of hardware failure or loosening. Stigmata of dish within thoracic spine. IMPRESSION: 1. Apparent circumferential wall thickening involving the majority of the colon, potentially accentuated due to underdistention though conceivably an enteritis could have a similar appearance. No evidence of enteric obstruction. 2. Bilateral enhancing lesions (right renal lesion measuring 1.2 cm, left-sided renal lesions measuring 1.7 and 1.3 cm) worrisome for bilateral renal cell carcinomas, similar to dedicated renal protocol CT scan performed 07/2019. No evidence of renal vein involvement or additional metastasis. 3. Additional smaller (subcentimeter) though potentially enhancing bilateral renal lesions which are too small to accurately characterize though are also worrisome for additional renal cell carcinomas and warrant continued dedicated follow-up at the discretion of [name]. 4. Aortic Atherosclerosis (ICD10-I70.0). Electronically Signed   By: [name]   On: 01/01/2020 10:47    Impression: Intractable nausea/vomiting, coffee ground emesis -Possibly esophagitis vs. gastritis -Hgb 11.7 today, decreased from 13.5 on arrival -History of hiatal hernia s/p repair in 07/2012  Plan: EGD tomorrow with Dr.  Therisa Doyne.  I thoroughly reviewed the procedure to include nature, benefits, and risks (including but not limited to bleeding, perforation, infection, anesthesia/cardiac and pulmonary complications).  Patient verbalized understanding and is in agreement to proceed with the EGD with Dr. Therisa Doyne tomorrow.  Continue Protonix 40mg  BID.  Continue antiemetics as needed.  Miralax PRN for constipation.   Continue to monitor H&H with transfusion as needed to maintain Hgb >7.  [facility] will follow.   LOS: 0 days   Salley Slaughter  PA-C 01/02/2020, 10:56 AM  Contact #  [phone]

## 2020-01-02 NOTE — H&P (View-Only) (Signed)
Referring Provider: Dr. Domenic Polite Primary Care Physician:  [name], MD Primary Gastroenterologist:  Dr. Michail Sermon [facility])  Reason for Consultation: Intractable nausea and vomiting, hematemesis  HPI: Rachel Kelly is a 74 y.o. female with history pertinent for hiatal hernia (s/p repair 07/2012), reflux esophagitis, breast cancer (s/p right lumpectomy), pulmonary hypertension, CKD stage III, and OSA presenting with intractable nausea and vomiting and hematemesis.  Patient reports that last week around Tuesday 6/8, she started having nausea and vomiting.  This has persisted, which caused her to present to the ED.  Prior to admission, she reports several episodes of brown, thick emesis concerning for coffee-ground emesis.  However, she states that since admission, the emesis is mostly clear.  She has not seen any red emesis.  She also had one softer, brown stool but denies any melena or hematochezia.  She has been constipated over the last several days.  Patient endorses odynophagia but denies any dysphagia.  Also endorses some chest pain but denies any abdominal pain.  Denies any shortness of breath.  Prior to this, she endorses a normal appetite.  She does report that she lost weight after having back surgery in May.  Reports intermittent ibuprofen use but this is infrequent.  Denies aspirin or anticoagulation use.  She denies any family history of gastrointestinal malignancies.  Last EGD was in 01/2013 and showed esophageal mucosal changes concerning for Barrett's (bx: chronically inflamed mucosa, negative for Barrett's, dysplasia, malignancy). Last colonoscopy was 12/2018 and showed diverticulosis and internal hemorrhoids, otherwise normal, no repeat recommended due to age.  Past Medical History:  Diagnosis Date   Anemia    Blood transfusion 2005   Family history of breast cancer    Family history of prostate cancer    Hernia    HH (hiatus hernia)    Hyperlipidemia     Hypertension    PAH (pulmonary artery hypertension) ([place])    Personal history of chemotherapy    Personal history of radiation therapy    Sarcoidosis    Shortness of breath    on exertion   Sleep apnea    does not use cpap   Wheezing     Past Surgical History:  Procedure Laterality Date   ABDOMINAL HYSTERECTOMY  1988   BACK SURGERY  2005   lower   BREAST LUMPECTOMY WITH RADIOACTIVE SEED AND SENTINEL LYMPH NODE BIOPSY Right 07/02/2018   Procedure: RIGHT BREAST RADIOACTIVE SEED LUMPECTOMY X2 AND RIGHT AXILLARY  SENTINEL LYMPH NODE BIOPSY;  Surgeon: [name], MD;  Location: [facility];  Service: General;  Laterality: Right;   ESOPHAGOGASTRODUODENOSCOPY  06/24/2012   Procedure: ESOPHAGOGASTRODUODENOSCOPY (EGD);  Surgeon: [name], MD;  Location: [name];  Service: General;  Laterality: N/A;   HIATAL HERNIA REPAIR  08/18/2012   Procedure: LAPAROSCOPIC REPAIR OF HIATAL HERNIA;  Surgeon: [name], MD;  Location: [facility];  Service: General;  Laterality: N/A;  Laparoscopic Hiatal Hernia Repair    INSERTION OF MESH  08/18/2012   Procedure: INSERTION OF MESH;  Surgeon: [name], MD;  Location: [facility];  Service: General;;   IR IMAGING GUIDED PORT INSERTION  07/19/2018   PORT-A-CATH REMOVAL Right 08/17/2019   Procedure: REMOVAL PORT-A-CATH;  Surgeon: Jovita Kussmaul, MD;  Location: [facility];  Service: General;  Laterality: Right;   PORTACATH PLACEMENT N/A 07/02/2018   Procedure: INSERTION PORT-A-CATH TRIED NO PORT IN PLACE;  Surgeon: [name], MD;  Location: [facility];  Service: General;  Laterality:  N/A;   VESICOVAGINAL FISTULA CLOSURE W/ TAH      Prior to Admission medications   Medication Sig Start Date End Date Taking? Authorizing Provider  aspirin-sod bicarb-citric acid (ALKA-SELTZER) 325 MG TBEF tablet Take 325 mg by mouth every 6 (six) hours as needed (cold symptoms).    Yes [provider]  Coenzyme Q10  200 MG TABS Take 200 mg by mouth daily.   Yes [provider]  hydrocortisone 2.5 % cream Apply 1 application topically 2 (two) times daily as needed (dry skin).   Yes [provider]  ibuprofen (ADVIL,MOTRIN) 200 MG tablet Take 400 mg by mouth every 6 (six) hours as needed for headache or moderate pain.   Yes [provider]  ketotifen (ALAWAY) 0.025 % ophthalmic solution Place 1 drop into both eyes 2 (two) times daily as needed (itchy, dry eyes).    Yes [provider]  loperamide (IMODIUM A-D) 2 MG capsule Take 2 mg by mouth daily as needed for diarrhea or loose stools.   Yes [provider]  methocarbamol (ROBAXIN) 500 MG tablet Take 500 mg by mouth 3 (three) times daily as needed for pain. 12/07/19  Yes [provider]  omeprazole (PRILOSEC) 20 MG capsule Take 20 mg by mouth daily.    Yes [provider]  ondansetron (ZOFRAN ODT) 4 MG disintegrating tablet Take 1 tablet (4 mg total) by mouth every 8 (eight) hours as needed for nausea or vomiting. 12/30/19  Yes Lamptey, Myrene Galas, MD  Polyvinyl Alcohol-Povidone PF (REFRESH) 1.4-0.6 % SOLN Place 1 drop into both eyes daily as needed (dry eyes).    Yes [provider]  potassium gluconate 595 MG TABS Take 595 mg by mouth daily.    Yes [provider]  simvastatin (ZOCOR) 20 MG tablet Take 20 mg by mouth every morning.    Yes [provider]  triamterene-hydrochlorothiazide (MAXZIDE-25) 37.5-25 MG per tablet Take 1 tablet by mouth every morning.    Yes [provider]  gabapentin (NEURONTIN) 100 MG capsule Take 1 capsule (100 mg total) by mouth at bedtime. 10/18/19   [name], [name], MD  prochlorperazine (COMPAZINE) 10 MG tablet Take 1 tablet (10 mg total) by mouth every 6 (six) hours as needed (Nausea or vomiting). 07/13/18 08/20/18  [name], [name], MD    Scheduled Meds:  acidophilus  2 capsule Oral Daily   gabapentin  100 mg Oral QHS    pantoprazole (PROTONIX) IV  40 mg Intravenous Q12H   potassium chloride  40 mEq Oral Once   simvastatin  20 mg Oral q morning - 10a   sodium chloride flush  3 mL Intravenous Q12H   Continuous Infusions:  sodium chloride     ciprofloxacin 400 mg (01/02/20 0630)   metronidazole 500 mg (01/02/20 0213)   PRN Meds:.acetaminophen **OR** acetaminophen, albuterol, hydrocortisone cream, ketotifen, loperamide, methocarbamol, ondansetron **OR** ondansetron (ZOFRAN) IV, polyvinyl alcohol  Allergies as of 01/01/2020 - Review Complete 01/01/2020  Allergen Reaction Noted   Shellfish allergy Other (See Comments) 08/11/2019   Enbucrilate Itching and Rash 09/09/2012   Penicillins Rash    Skin adhesives [cyanoacrylate] Itching and Rash 09/09/2012    Family History  Problem Relation Age of Onset   Clotting disorder Mother        PE   Heart disease Father    Breast cancer Maternal Grandmother        spread to brain   Coronary artery disease Other    Prostate cancer  Brother 73   Breast cancer Maternal Aunt    Cancer Maternal Aunt        unk type   Prostate cancer Paternal Grandfather    Prostate cancer Brother     Social History   Socioeconomic History   Marital status: Single    Spouse name: Not on file   Number of children: Not on file   Years of education: Not on file   Highest education level: Not on file  Occupational History   Occupation: Retired Pharmacist, hospital  Tobacco Use   Smoking status: Never Smoker   Smokeless tobacco: Never Used  Scientific laboratory technician Use: Never used  Substance and Sexual Activity   Alcohol use: No   Drug use: No   Sexual activity: Not on file  Other Topics Concern   Not on file  Social History Narrative   Not on file   Social Determinants of Health   Financial Resource Strain:    Difficulty of Paying Living Expenses:   Food Insecurity:    Worried About Charity fundraiser in the Last Year:    Arboriculturist in the  Last Year:   Transportation Needs:    Film/video editor (Medical):    Lack of Transportation (Non-Medical):   Physical Activity:    Days of Exercise per Week:    Minutes of Exercise per Session:   Stress:    Feeling of Stress :   Social Connections:    Frequency of Communication with Friends and Family:    Frequency of Social Gatherings with Friends and Family:    Attends Religious Services:    Active Member of Clubs or Organizations:    Attends Music therapist:    Marital Status:   Intimate Partner Violence:    Fear of Current or Ex-Partner:    Emotionally Abused:    Physically Abused:    Sexually Abused:     Review of Systems: Review of Systems  Constitutional: Positive for weight loss. Negative for chills and fever.  HENT: Positive for sore throat.   Eyes: Negative for pain and redness.  Respiratory: Negative for cough, shortness of breath and stridor.   Cardiovascular: Positive for chest pain. Negative for palpitations.  Gastrointestinal: Positive for heartburn, nausea and vomiting. Negative for abdominal pain, blood in stool, constipation, diarrhea and melena.  Genitourinary: Negative for flank pain and hematuria.  Musculoskeletal: Negative for falls and joint pain.  Skin: Negative for itching and rash.  Neurological: Negative for seizures and loss of consciousness.  Endo/Heme/Allergies: Negative for polydipsia. Does not bruise/bleed easily.  Psychiatric/Behavioral: Negative for substance abuse. The patient is not nervous/anxious.     Physical Exam: Vital signs: Vitals:   01/02/20 0151 01/02/20 0425  BP: 116/67 116/62  Pulse: 81 76  Resp: 16 18  Temp: 99 F (37.2 C) 98.1 F (36.7 C)  SpO2: 94% 92%     Physical Exam Vitals reviewed.  Constitutional:      General: She is not in acute distress.    Appearance: Normal appearance.  HENT:     Head: Normocephalic and atraumatic.     Mouth/Throat:     Mouth: Mucous membranes are  moist.     Pharynx: Oropharynx is clear.  Eyes:     General: No scleral icterus.    Extraocular Movements: Extraocular movements intact.     Conjunctiva/sclera: Conjunctivae normal.  Cardiovascular:     Rate and Rhythm: Normal rate and regular rhythm.  Pulses: Normal pulses.     Heart sounds: Normal heart sounds.  Pulmonary:     Effort: Pulmonary effort is normal. No respiratory distress.     Breath sounds: Normal breath sounds.  Abdominal:     General: Bowel sounds are normal. There is no distension.     Palpations: Abdomen is soft. There is no mass.     Tenderness: There is abdominal tenderness (mild, epigastric). There is no guarding or rebound.     Hernia: No hernia is present.  Musculoskeletal:        General: No swelling or tenderness.     Cervical back: Normal range of motion and neck supple.     Right lower leg: No edema.     Left lower leg: No edema.  Skin:    General: Skin is warm and dry.  Neurological:     Mental Status: She is oriented to person, place, and time. She is lethargic.  Psychiatric:        Mood and Affect: Mood normal.        Behavior: Behavior normal.     GI:  Lab Results: Recent Labs    12/30/19 1552 01/01/20 0558 01/02/20 0444  WBC 9.0 10.0 8.3  HGB 13.5 13.5 11.7*  HCT 40.9 40.9 36.0  PLT 246 220 173   BMET Recent Labs    12/30/19 1552 01/01/20 0558 01/02/20 0444  NA 139 143 142  K 4.5 3.2* 3.2*  CL 100 100 101  CO2 25 25 30   GLUCOSE 114* 140* 119*  BUN 22 25* 20  CREATININE 1.47* 1.30* 1.11*  CALCIUM 10.1 9.6 9.1   LFT Recent Labs    01/01/20 0942 01/01/20 0942 01/02/20 0444  PROT 8.0   < > 6.6  ALBUMIN 4.2   < > 3.2*  AST 22   < > 16  ALT 15   < > 12  ALKPHOS 76   < > 59  BILITOT 1.0   < > 1.0  BILIDIR 0.1  --   --   IBILI 0.9  --   --    < > = values in this interval not displayed.   PT/INR Recent Labs    01/01/20 0558  LABPROT 15.0  INR 1.2     Studies/Results: DG Chest 2 View  Result Date:  01/01/2020 CLINICAL DATA:  Centralized chest pain. EXAM: CHEST - 2 VIEW COMPARISON:  09/07/2019; chest CT-01/15/2012 FINDINGS: Grossly unchanged borderline enlarged cardiac silhouette and mediastinal contours with calcified mediastinal and hilar lymph nodes. Redemonstrated mild diffuse slightly nodular thickening of the pulmonary interstitium. No discrete focal airspace opacities. No pleural effusion or pneumothorax. No evidence of edema. No acute osseous abnormalities. Post lower lumbar paraspinal fusion, incompletely evaluated. Stigmata of dish within the thoracic spine. Surgical clips overlie the inferolateral aspect of the right breast. IMPRESSION: 1. Chronic bronchitic change without superimposed acute cardiopulmonary disease. 2. Redemonstrated calcified mediastinal and hilar lymph nodes, likely the sequela of previous granulomatous infection. Electronically Signed   By: [name]   On: 01/01/2020 06:24   CT Abdomen Pelvis W Contrast  Result Date: 01/01/2020 CLINICAL DATA:  Abdominal pain and vomiting since this past Tuesday. EXAM: CT ABDOMEN AND PELVIS WITH CONTRAST TECHNIQUE: Multidetector CT imaging of the abdomen and pelvis was performed using the standard protocol following bolus administration of intravenous contrast. CONTRAST:  170mL OMNIPAQUE IOHEXOL 300 MG/ML  SOLN COMPARISON:  08/04/2019; 11/21/2012 FINDINGS: Lower chest: Limited visualization of the lower thorax is negative for  focal airspace opacity or pleural effusion. Cardiomegaly.  No pericardial effusion. Hepatobiliary: Normal hepatic contour. There is mild diffuse decreased attenuation hepatic parenchyma on this postcontrast examination suggestive of hepatic steatosis. There is a minimal amount of focal fatty infiltration adjacent to the fissure for the ligamentum teres. Normal appearance of the gallbladder. No radiopaque gallstones. No intra or extrahepatic biliary ductal dilatation. No ascites. Pancreas: Normal appearance of the  pancreas. Spleen: Normal appearance of the spleen. Adrenals/Urinary Tract: There is symmetric enhancement and excretion of the bilateral kidneys. There is an approximately 1.7 x 1.4 x 1.5 cm enhancing partially exophytic lesion arising from the interpolar lateral aspect of the right kidney (axial image 31, series 3; coronal image 59, series 6), as well as a 1.7 x 1.6 x 1.5 cm heterogeneously enhancing lesion involving the inferior pole of the left kidney (axial image 37, series 3; coronal image 52, series 6) and approximately 1.3 x 1.3 x 0.9 cm partially enhancing lesion involving the interpolar aspect of the left kidney (axial image 31, series 3; coronal image 56, series 6), all of which appears similar to the 07/2018 examination and remain worrisome for renal cell carcinomas. While additional renal lesions are too small to accurately characterize several lesions appear to demonstrate enhancement and are worrisome for additional areas of renal cell carcinoma (representative coronal images 53 and 56, series 6). The bilateral renal veins appear patent. No definite evidence of nephrolithiasis on this postcontrast examination. No urine obstruction or perinephric stranding. Normal appearance the bilateral adrenal glands. Normal appearance of the urinary bladder given underdistention. Stomach/Bowel: There is moderate circumferential wall thickening involving the majority of the colon extending from the level of the hepatic flexure to the rectum, potentially accentuated due to underdistention though could be seen in the setting of an enteritis. No associated enteric obstruction. Otherwise, there are no discrete areas of bowel wall thickening. Normal appearance of the terminal ileum and the retrocecal appendix. Redemonstrated poor surgical change of the GE junction with persistent patulous distension of distal aspect of the esophagus. No pneumoperitoneum, pneumatosis or portal venous gas. Vascular/Lymphatic:  Atherosclerotic plaque within a tortuous but normal caliber abdominal aorta. No bulky retroperitoneal, mesenteric, pelvic or inguinal lymphadenopathy. Reproductive: Post hysterectomy.  No discrete adnexal lesion. Other: Tiny mesenteric fat containing peri umbilical hernia. Musculoskeletal: No acute or aggressive osseous abnormalities. Post L4-L5 paraspinal fusion and intervertebral disc space replacement with left lateral fusion and intervertebral disc space replacement of the L3-L4 vertebral body. No definite evidence of hardware failure or loosening. Stigmata of dish within thoracic spine. IMPRESSION: 1. Apparent circumferential wall thickening involving the majority of the colon, potentially accentuated due to underdistention though conceivably an enteritis could have a similar appearance. No evidence of enteric obstruction. 2. Bilateral enhancing lesions (right renal lesion measuring 1.2 cm, left-sided renal lesions measuring 1.7 and 1.3 cm) worrisome for bilateral renal cell carcinomas, similar to dedicated renal protocol CT scan performed 07/2019. No evidence of renal vein involvement or additional metastasis. 3. Additional smaller (subcentimeter) though potentially enhancing bilateral renal lesions which are too small to accurately characterize though are also worrisome for additional renal cell carcinomas and warrant continued dedicated follow-up at the discretion of [name]. 4. Aortic Atherosclerosis (ICD10-I70.0). Electronically Signed   By: [name]   On: 01/01/2020 10:47    Impression: Intractable nausea/vomiting, coffee ground emesis -Possibly esophagitis vs. gastritis -Hgb 11.7 today, decreased from 13.5 on arrival -History of hiatal hernia s/p repair in 07/2012  Plan: EGD tomorrow with Dr.  Therisa Doyne.  I thoroughly reviewed the procedure to include nature, benefits, and risks (including but not limited to bleeding, perforation, infection, anesthesia/cardiac and pulmonary complications).  Patient verbalized understanding and is in agreement to proceed with the EGD with Dr. Therisa Doyne tomorrow.  Continue Protonix 40mg  BID.  Continue antiemetics as needed.  Miralax PRN for constipation.   Continue to monitor H&H with transfusion as needed to maintain Hgb >7.  [facility] will follow.   LOS: 0 days   Salley Slaughter  PA-C 01/02/2020, 10:56 AM  Contact #  [phone]

## 2020-01-03 ENCOUNTER — Observation Stay (HOSPITAL_COMMUNITY): Payer: Medicare PPO | Admitting: Certified Registered Nurse Anesthetist

## 2020-01-03 ENCOUNTER — Encounter (HOSPITAL_COMMUNITY)
Admission: RE | Disposition: A | Discharge status: Home / Self Care | Payer: Self-pay | Source: Home / Self Care | Attending: Internal Medicine

## 2020-01-03 ENCOUNTER — Other Ambulatory Visit: Payer: Self-pay

## 2020-01-03 ENCOUNTER — Encounter (HOSPITAL_COMMUNITY): Payer: Self-pay | Admitting: Internal Medicine

## 2020-01-03 DIAGNOSIS — K529 Noninfective gastroenteritis and colitis, unspecified: Secondary | ICD-10-CM | POA: Diagnosis not present

## 2020-01-03 HISTORY — PX: ESOPHAGOGASTRODUODENOSCOPY: SHX5428

## 2020-01-03 HISTORY — PX: POLYPECTOMY: SHX5525

## 2020-01-03 HISTORY — PX: BIOPSY: SHX5522

## 2020-01-03 LAB — BASIC METABOLIC PANEL
Anion gap: 8 (ref 5–15)
BUN: 14 mg/dL (ref 8–23)
CO2: 27 mmol/L (ref 22–32)
Calcium: 8.4 mg/dL — ABNORMAL LOW (ref 8.9–10.3)
Chloride: 105 mmol/L (ref 98–111)
Creatinine, Ser: 1.11 mg/dL — ABNORMAL HIGH (ref 0.44–1.00)
GFR calc Af Amer: 56 mL/min — ABNORMAL LOW (ref >60)
GFR calc non Af Amer: 49 mL/min — ABNORMAL LOW (ref >60)
Glucose, Bld: 110 mg/dL — ABNORMAL HIGH (ref 70–99)
Potassium: 3.5 mmol/L (ref 3.5–5.1)
Sodium: 140 mmol/L (ref 135–145)

## 2020-01-03 LAB — CBC
HCT: 34.1 % — ABNORMAL LOW (ref 36.0–46.0)
Hemoglobin: 11.3 g/dL — ABNORMAL LOW (ref 12.0–15.0)
MCH: 30.7 pg (ref 26.0–34.0)
MCHC: 33.1 g/dL (ref 30.0–36.0)
MCV: 92.7 fL (ref 80.0–100.0)
Platelets: 148 10*3/uL — ABNORMAL LOW (ref 150–400)
RBC: 3.68 MIL/uL — ABNORMAL LOW (ref 3.87–5.11)
RDW: 12.1 % (ref 11.5–15.5)
WBC: 5.6 10*3/uL (ref 4.0–10.5)
nRBC: 0 % (ref 0.0–0.2)

## 2020-01-03 SURGERY — EGD (ESOPHAGOGASTRODUODENOSCOPY)
Anesthesia: Monitor Anesthesia Care

## 2020-01-03 MED ORDER — SUCRALFATE 1 GM/10ML PO SUSP
1.0000 g | Freq: Three times a day (TID) | ORAL | Status: DC
  Administered 2020-01-03 – 2020-01-05 (×9): 1 g via ORAL
  Filled 2020-01-03 (×9): qty 10

## 2020-01-03 MED ORDER — METRONIDAZOLE IVPB CUSTOM
250.0000 mg | Freq: Three times a day (TID) | INTRAVENOUS | Status: DC
  Administered 2020-01-03 – 2020-01-05 (×6): 250 mg via INTRAVENOUS
  Filled 2020-01-03 (×4): qty 50
  Filled 2020-01-03: qty 100
  Filled 2020-01-03 (×2): qty 50
  Filled 2020-01-03: qty 100
  Filled 2020-01-03: qty 50
  Filled 2020-01-03: qty 100
  Filled 2020-01-03: qty 50
  Filled 2020-01-03: qty 100
  Filled 2020-01-03: qty 50
  Filled 2020-01-03 (×2): qty 100

## 2020-01-03 MED ORDER — CIPROFLOXACIN IN D5W 400 MG/200ML IV SOLN
400.0000 mg | Freq: Two times a day (BID) | INTRAVENOUS | Status: DC
  Administered 2020-01-03 – 2020-01-05 (×4): 400 mg via INTRAVENOUS
  Filled 2020-01-03 (×6): qty 200

## 2020-01-03 MED ORDER — SODIUM CHLORIDE 0.9 % IV SOLN: INTRAVENOUS | Status: DC

## 2020-01-03 MED ORDER — LIDOCAINE 2% (20 MG/ML) 5 ML SYRINGE
INTRAMUSCULAR | Status: DC | PRN
  Administered 2020-01-03: 80 mg via INTRAVENOUS

## 2020-01-03 MED ORDER — CIPROFLOXACIN IN D5W 200 MG/100ML IV SOLN
200.0000 mg | Freq: Two times a day (BID) | INTRAVENOUS | Status: DC
  Filled 2020-01-03: qty 100

## 2020-01-03 MED ORDER — LACTATED RINGERS IV SOLN: INTRAVENOUS | Status: DC | PRN

## 2020-01-03 MED ORDER — PROPOFOL 10 MG/ML IV BOLUS
INTRAVENOUS | Status: DC | PRN
  Administered 2020-01-03: 20 ug via INTRAVENOUS

## 2020-01-03 MED ORDER — PROPOFOL 500 MG/50ML IV EMUL
INTRAVENOUS | Status: DC | PRN
  Administered 2020-01-03: 100 ug/kg/min via INTRAVENOUS

## 2020-01-03 NOTE — Progress Notes (Signed)
Per Central Telemetry pt had a 4 beat run of v-tach. Pt is asymptomatic and reports to adverse effects. Paged on call Hospitalist M. Sharlet Salina to inform at [place]. 01/03/2020 @ 0601 Cyndi Bender, RN

## 2020-01-03 NOTE — Transfer of Care (Signed)
Immediate Anesthesia Transfer of Care Note  Patient: Rachel Kelly  Procedure(s) Performed: ESOPHAGOGASTRODUODENOSCOPY (EGD) (N/A ) POLYPECTOMY BIOPSY  Patient Location: Endoscopy Unit  Anesthesia Type:MAC  Level of Consciousness: awake, alert , patient cooperative and responds to stimulation  Airway & Oxygen Therapy: Patient Spontanous Breathing and Patient connected to nasal cannula oxygen  Post-op Assessment: Report given to RN and Post -op Vital signs reviewed and stable  Post vital signs: Reviewed and stable  Last Vitals:  Vitals Value Taken Time  BP 155/73 01/03/20 0936  Temp    Pulse 75 01/03/20 0936  Resp 17 01/03/20 0936  SpO2 99 % 01/03/20 0936  Vitals shown include unvalidated device data.  Last Pain:  Vitals:   01/03/20 0833  TempSrc: Axillary  PainSc: 0-No pain         Complications: No complications documented.

## 2020-01-03 NOTE — Progress Notes (Addendum)
PROGRESS NOTE    Rachel Kelly  ZOX:[id number] DOB: [birth year] DOA: 01/01/2020 PCP: [name], MD  Brief Narrative: 74 year old female with history of hypertension, hyperlipidemia, breast cancer s/p right lumpectomy, anemia, pulmonary artery hypertension, chronic kidney disease stage IIIb, and OSA not on CPAP.  She presented with complaints of persistent vomiting over the last 3-4 days. -Patient reports coffee-ground emesis in the last few days.  She did briefly have mild diarrhea initially which has since resolved.  She denies any melena but does report that her stool was darker than usual. -Denies any NSAID use -Work-up in the emergency room noted white count of 10 K, creatinine of 1.3, high-sensitivity troponin was negative, chest x-ray was unremarkable, CT abdomen pelvis noted some circumferential thickening of but: And bilateral enhancing renal lesions as noted on previous scans. -She was seen in the ED 6/11 and urgent care in the past week for this. -Gastroenterology consulted, endoscopy 6/15  Assessment & Plan:   Nausea vomiting Severe esophagitis with esophageal ulcers -Noted on endoscopy today 6/15, single gastric polyp resected -Hemoglobin is stable -CT with some findings of questionable enteritis vs underdistension, she was started on Cipro Flagyl yesterday on admission for this, low clinically doubt enteritis, stop antibiotics after 2 to 3 days -Continue IV PPI, Carafate added, advance diet today  Chronic kidney disease stage IIIa -Creatinine is stable,  -Discontinue IV fluids  Chronic diastolic CHF Pulmonary hypertension -Diuretics on hold, stop IV fluids today, resume diuretics in the next 1 to 2 days  History of breast cancer -Prior lumpectomy, chemo/adjunctive radiation -Under surveillance, followed by [name]  Bilateral renal masses -Followed by [name] for several years now  GERD -Now on IV PPI  Recent back surgery -PT OT, follow-up with  Dr.Cohen    DVT prophylaxis: SCDs Code Status: Full code Family Communication: Discussed patient in detail, no family at bedside Disposition Plan:  Status is: Observation  The patient will require care spanning > 2 midnights and should be moved to inpatient because: IV treatments appropriate due to intensity of illness or inability to take PO, severe nausea, limited p.o. intake, severe esophagitis and esophageal ulcers   Dispo: The patient is from: Home              Anticipated d/c is to: Home              Anticipated d/c date is: 1-2 days              Patient currently is not medically stable to d/c.  Consultants:   [facility]   Procedures:   Antimicrobials:    Subjective: -Continues to complain of nausea, was able to tolerate very small amounts of liquids  Objective: Vitals:   01/03/20 0945 01/03/20 0955 01/03/20 1034 01/03/20 1442  BP: (!) 162/78 (!) 161/76 140/79 119/72  Pulse: 66 64 63 (!) 101  Resp: 19 20 18 15   Temp:   99.2 F (37.3 C) 98.8 F (37.1 C)  TempSrc:   Oral Oral  SpO2: 95% 94% 98% 93%  Weight:      Height:        Intake/Output Summary (Last 24 hours) at 01/03/2020 1508 Last data filed at 01/03/2020 1433 Gross per 24 hour  Intake 2895.52 ml  Output --  Net 2895.52 ml   Filed Weights   01/01/20 0547 01/03/20 0500  Weight: 76 kg 71.5 kg    Examination:  General exam: Elderly chronically ill female sitting up in bed, uncomfortable, AAOx3  HEENT: No JVD CVS: S1-S2, regular rate rhythm Lungs: Clear bilaterally Abdomen: Soft, nontender, bowel sounds present Extremities: Trace edema Skin: No rashes on exposed skin Psychiatry: Mood & affect appropriate.     Data Reviewed:   CBC: Recent Labs  Lab 12/30/19 1552 01/01/20 0558 01/02/20 0444 01/03/20 0453  WBC 9.0 10.0 8.3 5.6  NEUTROABS 7.6  --  6.3  --   HGB 13.5 13.5 11.7* 11.3*  HCT 40.9 40.9 36.0 34.1*  MCV 92.1 93.6 93.5 92.7  PLT 246 220 173 854*   Basic Metabolic  Panel: Recent Labs  Lab 12/30/19 1552 01/01/20 0558 01/02/20 0444 01/03/20 0453  NA 139 143 142 140  K 4.5 3.2* 3.2* 3.5  CL 100 100 101 105  CO2 25 25 30 27   GLUCOSE 114* 140* 119* 110*  BUN 22 25* 20 14  CREATININE 1.47* 1.30* 1.11* 1.11*  CALCIUM 10.1 9.6 9.1 8.4*   GFR: Estimated Creatinine Clearance: 37.7 mL/min (A) (by C-G formula based on SCr of 1.11 mg/dL (H)). Liver Function Tests: Recent Labs  Lab 12/30/19 1552 01/01/20 0942 01/02/20 0444  AST 27 22 16   ALT 16 15 12   ALKPHOS 102 76 59  BILITOT 1.2 1.0 1.0  PROT 8.2* 8.0 6.6  ALBUMIN 4.2 4.2 3.2*   Recent Labs  Lab 01/01/20 0942  LIPASE 19   No results for input(s): AMMONIA in the last 168 hours. Coagulation Profile: Recent Labs  Lab 01/01/20 0558  INR 1.2   Cardiac Enzymes: No results for input(s): CKTOTAL, CKMB, CKMBINDEX, TROPONINI in the last 168 hours. BNP (last 3 results) No results for input(s): PROBNP in the last 8760 hours. HbA1C: No results for input(s): HGBA1C in the last 72 hours. CBG: No results for input(s): GLUCAP in the last 168 hours. Lipid Profile: No results for input(s): CHOL, HDL, LDLCALC, TRIG, CHOLHDL, LDLDIRECT in the last 72 hours. Thyroid Function Tests: No results for input(s): TSH, T4TOTAL, FREET4, T3FREE, THYROIDAB in the last 72 hours. Anemia Panel: No results for input(s): VITAMINB12, FOLATE, FERRITIN, TIBC, IRON, RETICCTPCT in the last 72 hours. Urine analysis:    Component Value Date/Time   COLORURINE STRAW (A) 07/27/2018 1050   APPEARANCEUR CLEAR 07/27/2018 1050   LABSPEC 1.020 08/05/2019 1155   PHURINE 6.0 08/05/2019 1155   GLUCOSEU NEGATIVE 08/05/2019 1155   HGBUR MODERATE (A) 08/05/2019 1155   BILIRUBINUR NEGATIVE 08/05/2019 1155   KETONESUR NEGATIVE 08/05/2019 1155   PROTEINUR 30 (A) 08/05/2019 1155   UROBILINOGEN 0.2 08/05/2019 1155   NITRITE POSITIVE (A) 08/05/2019 1155   LEUKOCYTESUR LARGE (A) 08/05/2019 1155   Sepsis  Labs: @LABRCNTIP (procalcitonin:4,lacticidven:4)  ) Recent Results (from the past 240 hour(s))  SARS Coronavirus 2 by RT PCR (hospital order, performed in [place] hospital lab) Nasopharyngeal Nasopharyngeal Swab     Status: None   Collection Time: 01/01/20 12:03 PM   Specimen: Nasopharyngeal Swab  Result Value Ref Range Status   SARS Coronavirus 2 NEGATIVE NEGATIVE Final    Comment: (NOTE) SARS-CoV-2 target nucleic acids are NOT DETECTED.  The SARS-CoV-2 RNA is generally detectable in upper and lower respiratory specimens during the acute phase of infection. The lowest concentration of SARS-CoV-2 viral copies this assay can detect is 250 copies / mL. A negative result does not preclude SARS-CoV-2 infection and should not be used as the sole basis for treatment or other patient management decisions.  A negative result may occur with improper specimen collection / handling, submission of specimen other than nasopharyngeal swab, presence of  viral mutation(s) within the areas targeted by this assay, and inadequate number of viral copies (<250 copies / mL). A negative result must be combined with clinical observations, patient history, and epidemiological information.  Fact Sheet for Patients:   StrictlyIdeas.no  Fact Sheet for Healthcare Providers: BankingDealers.co.za  This test is not yet approved or  cleared by the [place] FDA and has been authorized for detection and/or diagnosis of SARS-CoV-2 by FDA under an Emergency Use Authorization (EUA).  This EUA will remain in effect (meaning this test can be used) for the duration of the COVID-19 declaration under Section 564(b)(1) of the Act, 21 U.S.C. section 360bbb-3(b)(1), unless the authorization is terminated or revoked sooner.  Performed at [facility], [place] [address] [place], [place] [address]          Radiology Studies: No results  found.      Scheduled Meds: . acidophilus  2 capsule Oral Daily  . gabapentin  100 mg Oral QHS  . pantoprazole (PROTONIX) IV  40 mg Intravenous Q12H  . simvastatin  20 mg Oral q morning - 10a  . sodium chloride flush  3 mL Intravenous Q12H  . sucralfate  1 g Oral TID WC & HS   Continuous Infusions: . ciprofloxacin 400 mg (01/03/20 0528)  . metronidazole 500 mg (01/03/20 1340)     LOS: 0 days    Time spent: 20min  Domenic Polite, MD [facility] 01/03/2020, 3:08 PM

## 2020-01-03 NOTE — Interval H&P Note (Signed)
History and Physical Interval Note: 74/female with nausea, coffee ground vomiting, abdominal pain, history of hiatal hernia repair for an EGD.  01/03/2020 9:10 AM  Rachel Kelly  has presented today for EGD, with the diagnosis of hematemesis.  The various methods of treatment have been discussed with the patient and family. After consideration of risks, benefits and other options for treatment, the patient has consented to  Procedure(s): ESOPHAGOGASTRODUODENOSCOPY (EGD) (N/A) as a surgical intervention.  The patient's history has been reviewed, patient examined, no change in status, stable for surgery.  I have reviewed the patient's chart and labs.  Questions were answered to the patient's satisfaction.     Ronnette Juniper

## 2020-01-03 NOTE — Anesthesia Preprocedure Evaluation (Signed)
Anesthesia Evaluation  Patient identified by MRN, date of birth, ID band Patient awake    Reviewed: Allergy & Precautions, NPO status , Patient's Chart, lab work & pertinent test results  History of Anesthesia Complications Negative for: history of anesthetic complications  Airway Mallampati: II  TM Distance: >3 FB Neck ROM: Full    Dental  (+) Teeth Intact   Pulmonary shortness of breath, sleep apnea ,  Sarcoidosis Pulmonary HTN   Pulmonary exam normal        Cardiovascular hypertension, Normal cardiovascular exam     Neuro/Psych negative neurological ROS  negative psych ROS   GI/Hepatic Neg liver ROS, hiatal hernia, GERD  ,  Endo/Other  negative endocrine ROS  Renal/GU Renal InsufficiencyRenal disease  negative genitourinary   Musculoskeletal negative musculoskeletal ROS (+)   Abdominal   Peds  Hematology  (+) Blood dyscrasia, anemia ,   Anesthesia Other Findings   Reproductive/Obstetrics                             Anesthesia Physical Anesthesia Plan  ASA: III  Anesthesia Plan: MAC   Post-op Pain Management:    Induction: Intravenous  PONV Risk Score and Plan: 2 and Propofol infusion, TIVA and Treatment may vary due to age or medical condition  Airway Management Planned: Natural Airway, Nasal Cannula and Simple Face Mask  Additional Equipment: None  Intra-op Plan:   Post-operative Plan:   Informed Consent: I have reviewed the patients History and Physical, chart, labs and discussed the procedure including the risks, benefits and alternatives for the proposed anesthesia with the patient or authorized representative who has indicated his/her understanding and acceptance.       Plan Discussed with:   Anesthesia Plan Comments:         Anesthesia Quick Evaluation

## 2020-01-03 NOTE — Op Note (Signed)
[facility] Patient Name: Rachel Kelly Procedure Date : 01/03/2020 MRN: [id number] Attending MD: Ronnette Juniper , MD Date of Birth: [birth year] CSN: [id number] Age: 74 Admit Type: Inpatient Procedure:                Upper GI endoscopy Indications:              Generalized abdominal pain, Odynophagia,                            Coffee-ground emesis, Nausea with vomiting Providers:                Ronnette Juniper, MD, Baird Cancer, RN, Wynonia Sours, RN,                            Cherylynn Ridges, Technician, Tressia Miners, CRNA Referring MD:             [facility] Medicines:                Monitored Anesthesia Care Complications:            No immediate complications. Estimated blood loss:                            Minimal. Estimated Blood Loss:     Estimated blood loss was minimal. Procedure:                Pre-Anesthesia Assessment:                           - Prior to the procedure, a History and Physical                            was performed, and patient medications and                            allergies were reviewed. The patient's tolerance of                            previous anesthesia was also reviewed. The risks                            and benefits of the procedure and the sedation                            options and risks were discussed with the patient.                            All questions were answered, and informed consent                            was obtained. Prior Anticoagulants: The patient has                            taken no previous anticoagulant or antiplatelet  agents. ASA Grade Assessment: III - A patient with                            severe systemic disease. After reviewing the risks                            and benefits, the patient was deemed in                            satisfactory condition to undergo the procedure.                           After obtaining informed consent, the endoscope was                             passed under direct vision. Throughout the                            procedure, the patient's blood pressure, pulse, and                            oxygen saturations were monitored continuously. The                            GIF-H190 ([id number]) Olympus gastroscope was                            introduced through the mouth, and advanced to the                            second part of duodenum. The upper GI endoscopy was                            accomplished without difficulty. The patient                            tolerated the procedure well. Scope In: Scope Out: Findings:      Many superficial esophageal ulcers with no bleeding and no stigmata of       recent bleeding were found 25 to 37 cm from the incisors.      LA Grade D (one or more mucosal breaks involving at least 75% of       esophageal circumference) esophagitis with no bleeding was found 25 to       37 cm from the incisors. Biopsies were taken with a cold forceps for       histology.      A single 6 mm sessile polyp with no stigmata of recent bleeding was       found in the cardia. The polyp was removed with a piecemeal technique       using a cold biopsy forceps. Resection and retrieval were complete.      Diffuse moderately erythematous mucosa without bleeding was found in the       gastric antrum. Biopsies were taken with a cold forceps for Helicobacter       pylori testing.  A small hiatal hernia was present.      The cardia and gastric fundus were normal on retroflexion.      The examined duodenum was normal. Impression:               - Esophageal ulcers with no bleeding and no                            stigmata of recent bleeding.                           - LA Grade D esophagitis with no bleeding. Biopsied.                           - A single gastric polyp. Resected and retrieved.                           - Erythematous mucosa in the antrum. Biopsied.                           -  Small hiatal hernia.                           - Normal examined duodenum. Moderate Sedation:      Patient did not receive moderate sedation for this procedure, but       instead received monitored anesthesia care. Recommendation:           - Soft diet.                           - Continue present medications.                           - Await pathology results.                           - Use Protonix (pantoprazole) 40 mg PO BID for 2                            months.                           - Use sucralfate suspension 1 gram PO QID for 2                            weeks. Procedure Code(s):        --- Professional ---                           [id number], Esophagogastroduodenoscopy, flexible,                            transoral; with biopsy, single or multiple Diagnosis Code(s):        --- Professional ---                           K22.10, Ulcer of esophagus without bleeding  K20.90, Esophagitis, unspecified without bleeding                           K31.7, Polyp of stomach and duodenum                           K31.89, Other diseases of stomach and duodenum                           K44.9, Diaphragmatic hernia without obstruction or                            gangrene                           R10.84, Generalized abdominal pain                           R13.10, Dysphagia, unspecified                           K92.0, Hematemesis                           R11.2, Nausea with vomiting, unspecified CPT copyright 2019 American Medical Association. All rights reserved. The codes documented in this report are preliminary and upon coder review may  be revised to meet current compliance requirements. Ronnette Juniper, MD 01/03/2020 9:37:50 AM This report has been signed electronically. Number of Addenda: 0

## 2020-01-03 NOTE — Anesthesia Postprocedure Evaluation (Signed)
Anesthesia Post Note  Patient: Rachel Kelly  Procedure(s) Performed: ESOPHAGOGASTRODUODENOSCOPY (EGD) (N/A ) POLYPECTOMY BIOPSY     Patient location during evaluation: Endoscopy Anesthesia Type: MAC Level of consciousness: awake and alert Pain management: pain level controlled Vital Signs Assessment: post-procedure vital signs reviewed and stable Respiratory status: spontaneous breathing, nonlabored ventilation and respiratory function stable Cardiovascular status: blood pressure returned to baseline and stable Postop Assessment: no apparent nausea or vomiting Anesthetic complications: no   No complications documented.  Last Vitals:  Vitals:   01/03/20 0955 01/03/20 1034  BP: (!) 161/76 140/79  Pulse: 64 63  Resp: 20 18  Temp:  37.3 C  SpO2: 94% 98%    Last Pain:  Vitals:   01/03/20 1200  TempSrc:   PainSc: 0-No pain                 Lidia Collum

## 2020-01-03 NOTE — Plan of Care (Signed)
  Problem: Education: Goal: Knowledge of General Education information will improve Description Including pain rating scale, medication(s)/side effects and non-pharmacologic comfort measures Outcome: Progressing   

## 2020-01-03 NOTE — Brief Op Note (Signed)
01/01/2020 - 01/03/2020  9:31 AM  PATIENT:  Rachel Kelly  74 y.o. female  PRE-OPERATIVE DIAGNOSIS:  hematemesis  POST-OPERATIVE DIAGNOSIS:  EGD: biopsy for h pylori, gastric polypectomy, biopsy for esophagitis and ulcer  PROCEDURE:  Procedure(s): ESOPHAGOGASTRODUODENOSCOPY (EGD) (N/A) POLYPECTOMY BIOPSY  SURGEON:  Surgeon(s) and Role:    Ronnette Juniper, MD - Primary  PHYSICIAN ASSISTANT:   ASSISTANTS: Lynett Fish, Marcelle Smiling, RN, Danne Baxter  ANESTHESIA:   MAC  EBL:  Minimal  BLOOD ADMINISTERED:none  DRAINS: none   LOCAL MEDICATIONS USED:  NONE  SPECIMEN:  Biopsy / Limited Resection  DISPOSITION OF SPECIMEN:  PATHOLOGY  COUNTS:  YES  TOURNIQUET:  * No tourniquets in log *  DICTATION: .Dragon Dictation  PLAN OF CARE: Admit to inpatient   PATIENT DISPOSITION:  PACU - hemodynamically stable.   Delay start of Pharmacological VTE agent (>24hrs) due to surgical blood loss or risk of bleeding: not applicable

## 2020-01-04 ENCOUNTER — Encounter (HOSPITAL_COMMUNITY): Payer: Self-pay | Admitting: Gastroenterology

## 2020-01-04 DIAGNOSIS — Z880 Allergy status to penicillin: Secondary | ICD-10-CM | POA: Diagnosis not present

## 2020-01-04 DIAGNOSIS — M549 Dorsalgia, unspecified: Secondary | ICD-10-CM | POA: Diagnosis present

## 2020-01-04 DIAGNOSIS — N1832 Chronic kidney disease, stage 3b: Secondary | ICD-10-CM | POA: Diagnosis present

## 2020-01-04 DIAGNOSIS — K529 Noninfective gastroenteritis and colitis, unspecified: Secondary | ICD-10-CM | POA: Diagnosis present

## 2020-01-04 DIAGNOSIS — E785 Hyperlipidemia, unspecified: Secondary | ICD-10-CM | POA: Diagnosis present

## 2020-01-04 DIAGNOSIS — K219 Gastro-esophageal reflux disease without esophagitis: Secondary | ICD-10-CM | POA: Diagnosis not present

## 2020-01-04 DIAGNOSIS — E876 Hypokalemia: Secondary | ICD-10-CM

## 2020-01-04 DIAGNOSIS — K920 Hematemesis: Secondary | ICD-10-CM | POA: Diagnosis present

## 2020-01-04 DIAGNOSIS — N183 Chronic kidney disease, stage 3 unspecified: Secondary | ICD-10-CM | POA: Diagnosis not present

## 2020-01-04 DIAGNOSIS — G4733 Obstructive sleep apnea (adult) (pediatric): Secondary | ICD-10-CM | POA: Diagnosis present

## 2020-01-04 DIAGNOSIS — Z20822 Contact with and (suspected) exposure to covid-19: Secondary | ICD-10-CM | POA: Diagnosis present

## 2020-01-04 DIAGNOSIS — I2721 Secondary pulmonary arterial hypertension: Secondary | ICD-10-CM | POA: Diagnosis present

## 2020-01-04 DIAGNOSIS — Z90710 Acquired absence of both cervix and uterus: Secondary | ICD-10-CM | POA: Diagnosis not present

## 2020-01-04 DIAGNOSIS — K449 Diaphragmatic hernia without obstruction or gangrene: Secondary | ICD-10-CM | POA: Diagnosis present

## 2020-01-04 DIAGNOSIS — K317 Polyp of stomach and duodenum: Secondary | ICD-10-CM | POA: Diagnosis present

## 2020-01-04 DIAGNOSIS — I130 Hypertensive heart and chronic kidney disease with heart failure and stage 1 through stage 4 chronic kidney disease, or unspecified chronic kidney disease: Secondary | ICD-10-CM | POA: Diagnosis present

## 2020-01-04 DIAGNOSIS — K5900 Constipation, unspecified: Secondary | ICD-10-CM | POA: Diagnosis present

## 2020-01-04 DIAGNOSIS — I5032 Chronic diastolic (congestive) heart failure: Secondary | ICD-10-CM | POA: Diagnosis present

## 2020-01-04 DIAGNOSIS — R079 Chest pain, unspecified: Secondary | ICD-10-CM | POA: Diagnosis not present

## 2020-01-04 DIAGNOSIS — K2100 Gastro-esophageal reflux disease with esophagitis, without bleeding: Secondary | ICD-10-CM | POA: Diagnosis present

## 2020-01-04 DIAGNOSIS — Z91013 Allergy to seafood: Secondary | ICD-10-CM | POA: Diagnosis not present

## 2020-01-04 DIAGNOSIS — K2210 Ulcer of esophagus without bleeding: Secondary | ICD-10-CM | POA: Diagnosis present

## 2020-01-04 DIAGNOSIS — Z923 Personal history of irradiation: Secondary | ICD-10-CM | POA: Diagnosis not present

## 2020-01-04 DIAGNOSIS — R1110 Vomiting, unspecified: Secondary | ICD-10-CM | POA: Diagnosis present

## 2020-01-04 DIAGNOSIS — E860 Dehydration: Secondary | ICD-10-CM | POA: Diagnosis present

## 2020-01-04 DIAGNOSIS — N2889 Other specified disorders of kidney and ureter: Secondary | ICD-10-CM | POA: Diagnosis present

## 2020-01-04 DIAGNOSIS — C50919 Malignant neoplasm of unspecified site of unspecified female breast: Secondary | ICD-10-CM | POA: Diagnosis present

## 2020-01-04 DIAGNOSIS — Z91048 Other nonmedicinal substance allergy status: Secondary | ICD-10-CM | POA: Diagnosis not present

## 2020-01-04 MED ORDER — HEPARIN SODIUM (PORCINE) 5000 UNIT/ML IJ SOLN
5000.0000 [IU] | Freq: Three times a day (TID) | INTRAMUSCULAR | Status: DC
  Administered 2020-01-04 – 2020-01-05 (×4): 5000 [IU] via SUBCUTANEOUS
  Filled 2020-01-04 (×4): qty 1

## 2020-01-04 NOTE — Progress Notes (Addendum)
PROGRESS NOTE  Rachel Kelly TGP:[id number] DOB: 08-Oct-1945 DOA: 01/01/2020 PCP: [name], MD   LOS: 0 days   Brief narrative: As per HPI and previous provider,  Patient is a 74 year old female with history of hypertension, hyperlipidemia, breast cancers/pright lumpectomy, anemia, pulmonary artery hypertension, chronic kidney disease stage IIIb,andOSA not on CPAP presented to the hospital with complaints of persistent vomiting over the last 3-4 days. Patient reported coffee-ground emesis as well.  She did briefly have mild diarrhea initially which  resolved.  She denied ny melena but does report that her stool was darker than usual.  Work-up in the emergency room noted white count of 10 K, creatinine of 1.3, high-sensitivity troponin was negative, chest x-ray was unremarkable, CT abdomen pelvis noted some circumferential thickening  bilateral enhancing renal lesions as noted on previous scans. She was seen in the ED 6/11 and urgent care in the past week for this. Patient was then admitted to the hospital for further evaluation and treatment.  Assessment/Plan:  Principal Problem:   Enteritis Active Problems:   Hyperlipidemia   GERD (gastroesophageal reflux disease)   Bilateral renal masses   Chest pain   CKD (chronic kidney disease), stage III   Nausea vomiting Severe esophagitis with esophageal ulcers Patient underwent endoscopic evaluation on 6/15 with findings of gastric polyp which was resected.  There was severe esophagitis grade D without bleeding, with esophageal ulcers.  CT scan of the abdomen showed some questionable enteritis, so was on Cipro and Metro initially, but clinically not convincing and exacerbating nausea so will discontinue.  GI recommend IV PPI and Carafate with p.o. PPI on discharge.  Patient was advanced from clears to soft but had nausea and vomiting and could not tolerate the diet.  Will downgrade to full liquids today.  We will gradually advance diet by  tomorrow.  Chronic kidney disease stage IIIa  -Creatinine is stable at 1.1. off  IV fluids.  Chronic diastolic CHF Pulmonary hypertension.Diuretics on hold, resume diuretics in the next 1 to 2 days  History of breast cancer -Prior lumpectomy, chemo/adjunctive radiation.  Follows up with [name]  Bilateral renal masses -Followed by [name] for several years now  GERD Continue PPI twice a day, sucralfate.  Recent back surgery Physical therapy evaluation, follow-up with Dr.Cohen   VTE Prophylaxis: Sequential compression device  Code Status: Full code  Family Communication: none  Status is: Observation, recommend inpatient admission due to persistent GI symptoms including poor oral intolerance, nausea and vomiting.  Dispo: The patient is from: Home              Anticipated d/c is to: Home              Anticipated d/c date is: 1 day              Patient currently is not medically stable to d/c.   Consultants:  GI  Procedures:  Endoscopy with biopsy and removal of gastric polyp  Antibiotics:  . We will discontinue Cipro and Metro.  Anti-infectives (From admission, onward)   Start     Dose/Rate Route Frequency Ordered Stop   01/03/20 1800  ciprofloxacin (CIPRO) IVPB 200 mg  Status:  Discontinued        200 mg 100 mL/hr over 60 Minutes Intravenous Every 12 hours 01/03/20 1509 01/03/20 1558   01/03/20 1800  metroNIDAZOLE (FLAGYL) IVPB 250 mg 50 mL     Discontinue     250 mg 50 mL/hr over 60 Minutes  Intravenous Every 8 hours 01/03/20 1509     01/03/20 1800  ciprofloxacin (CIPRO) IVPB 400 mg     Discontinue     400 mg 200 mL/hr over 60 Minutes Intravenous Every 12 hours 01/03/20 1558     01/01/20 1800  metroNIDAZOLE (FLAGYL) IVPB 500 mg  Status:  Discontinued        500 mg 100 mL/hr over 60 Minutes Intravenous Every 8 hours 01/01/20 1553 01/03/20 1509   01/01/20 1800  ciprofloxacin (CIPRO) IVPB 400 mg  Status:  Discontinued        400 mg 200 mL/hr over  60 Minutes Intravenous Every 12 hours 01/01/20 1553 01/03/20 1509       Subjective: Today, patient was seen and examined at bedside.  Still complains of a mild retrosternal pain on trying to eat some food.  Had vomiting 30 minutes after having food.  Patient does not feel comfortable about going home today  Objective: Vitals:   01/03/20 2108 01/04/20 0405  BP: 134/76 121/66  Pulse: 67 73  Resp: 14 16  Temp: 98.8 F (37.1 C) 98.6 F (37 C)  SpO2: 96% 95%    Intake/Output Summary (Last 24 hours) at 01/04/2020 0748 Last data filed at 01/04/2020 0635 Gross per 24 hour  Intake 2483.5 ml  Output 1000 ml  Net 1483.5 ml   Filed Weights   01/01/20 0547 01/03/20 0500  Weight: 76 kg 71.5 kg   Body mass index is 31.84 kg/m.   Physical Exam: GENERAL: Patient is alert awake and oriented. Not in obvious distress.  Obese HENT: No scleral pallor or icterus. Pupils equally reactive to light. Oral mucosa is moist NECK: is supple, no gross swelling noted. CHEST: Clear to auscultation. No crackles or wheezes.  Diminished breath sounds bilaterally. CVS: S1 and S2 heard, no murmur. Regular rate and rhythm.  ABDOMEN: Soft, non-tender, bowel sounds are present. EXTREMITIES: Trace edema. CNS: Cranial nerves are intact. No focal motor deficits. SKIN: warm and dry without rashes.  Data Review: I have personally reviewed the following laboratory data and studies,  CBC: Recent Labs  Lab 12/30/19 1552 01/01/20 0558 01/02/20 0444 01/03/20 0453  WBC 9.0 10.0 8.3 5.6  NEUTROABS 7.6  --  6.3  --   HGB 13.5 13.5 11.7* 11.3*  HCT 40.9 40.9 36.0 34.1*  MCV 92.1 93.6 93.5 92.7  PLT 246 220 173 027*   Basic Metabolic Panel: Recent Labs  Lab 12/30/19 1552 01/01/20 0558 01/02/20 0444 01/03/20 0453  NA 139 143 142 140  K 4.5 3.2* 3.2* 3.5  CL 100 100 101 105  CO2 25 25 30 27   GLUCOSE 114* 140* 119* 110*  BUN 22 25* 20 14  CREATININE 1.47* 1.30* 1.11* 1.11*  CALCIUM 10.1 9.6 9.1 8.4*    Liver Function Tests: Recent Labs  Lab 12/30/19 1552 01/01/20 0942 01/02/20 0444  AST 27 22 16   ALT 16 15 12   ALKPHOS 102 76 59  BILITOT 1.2 1.0 1.0  PROT 8.2* 8.0 6.6  ALBUMIN 4.2 4.2 3.2*   Recent Labs  Lab 01/01/20 0942  LIPASE 19   No results for input(s): AMMONIA in the last 168 hours. Cardiac Enzymes: No results for input(s): CKTOTAL, CKMB, CKMBINDEX, TROPONINI in the last 168 hours. BNP (last 3 results) No results for input(s): BNP in the last 8760 hours.  ProBNP (last 3 results) No results for input(s): PROBNP in the last 8760 hours.  CBG: No results for input(s): GLUCAP in the last 168 hours.  Recent Results (from the past 240 hour(s))  SARS Coronavirus 2 by RT PCR (hospital order, performed in [facility] hospital lab) Nasopharyngeal Nasopharyngeal Swab     Status: None   Collection Time: 01/01/20 12:03 PM   Specimen: Nasopharyngeal Swab  Result Value Ref Range Status   SARS Coronavirus 2 NEGATIVE NEGATIVE Final    Comment: (NOTE) SARS-CoV-2 target nucleic acids are NOT DETECTED.  The SARS-CoV-2 RNA is generally detectable in upper and lower respiratory specimens during the acute phase of infection. The lowest concentration of SARS-CoV-2 viral copies this assay can detect is 250 copies / mL. A negative result does not preclude SARS-CoV-2 infection and should not be used as the sole basis for treatment or other patient management decisions.  A negative result may occur with improper specimen collection / handling, submission of specimen other than nasopharyngeal swab, presence of viral mutation(s) within the areas targeted by this assay, and inadequate number of viral copies (<250 copies / mL). A negative result must be combined with clinical observations, patient history, and epidemiological information.  Fact Sheet for Patients:   StrictlyIdeas.no  Fact Sheet for Healthcare  Providers: BankingDealers.co.za  This test is not yet approved or  cleared by the [place] FDA and has been authorized for detection and/or diagnosis of SARS-CoV-2 by FDA under an Emergency Use Authorization (EUA).  This EUA will remain in effect (meaning this test can be used) for the duration of the COVID-19 declaration under Section 564(b)(1) of the Act, 21 U.S.C. section 360bbb-3(b)(1), unless the authorization is terminated or revoked sooner.  Performed at [facility], [place] [address], [place], [place] [address]      Studies: No results found.    Flora Lipps, MD  [facility] 01/04/2020

## 2020-01-04 NOTE — Plan of Care (Signed)
  Problem: Education: Goal: Knowledge of General Education information will improve Description: Including pain rating scale, medication(s)/side effects and non-pharmacologic comfort measures Outcome: Progressing  Aeb pt verbalizes poc this AM Problem: Activity: Goal: Risk for activity intolerance will decrease Outcome: Progressing  [facility] Problem: Nutrition: Goal: Adequate nutrition will be maintained Outcome: Progressing  Aeb pt reports good appetite

## 2020-01-04 NOTE — Progress Notes (Signed)
[facility] Gastroenterology Progress Note  Rachel Kelly 74 y.o. Apr 09, 1946  CC:  Esophagitis/esophageal ulcers  Subjective: Patient reports improvement in odynophagia and chest pain.  Also notes improvement in nausea and vomiting.  Denies any abdominal pain.  States she is afraid to advance her diet and ordered a clear liquid breakfast.  Has not been vomiting, but does "spit up" during and after eating.  This mostly consists of secretions and not food.  ROS : Review of Systems  HENT: Positive for sore throat (mild).   Cardiovascular: Positive for chest pain (mild).  Gastrointestinal: Negative for abdominal pain, blood in stool, constipation, diarrhea, heartburn, melena, nausea and vomiting.   Objective: Vital signs in last 24 hours: Vitals:   01/03/20 2108 01/04/20 0405  BP: 134/76 121/66  Pulse: 67 73  Resp: 14 16  Temp: 98.8 F (37.1 C) 98.6 F (37 C)  SpO2: 96% 95%    Physical Exam:  General:  Lethargic, elderly, oriented, cooperative, no acute distress  Head:  Normocephalic, without obvious abnormality, atraumatic  Eyes:  Anicteric sclera, EOMs intact   Lungs:   Clear to auscultation bilaterally, respirations unlabored  Heart:  Regular rate and rhythm, S1/S2 normal  Abdomen:   Soft, non-tender, non-distended, normoactive bowel sounds, no guarding or peritoneal signs  Extremities: Extremities normal, atraumatic, no  edema  Pulses: 2+ and symmetric    Lab Results: Recent Labs    01/02/20 0444 01/03/20 0453  NA 142 140  K 3.2* 3.5  CL 101 105  CO2 30 27  GLUCOSE 119* 110*  BUN 20 14  CREATININE 1.11* 1.11*  CALCIUM 9.1 8.4*   Recent Labs    01/02/20 0444  AST 16  ALT 12  ALKPHOS 59  BILITOT 1.0  PROT 6.6  ALBUMIN 3.2*   Recent Labs    01/02/20 0444 01/03/20 0453  WBC 8.3 5.6  NEUTROABS 6.3  --   HGB 11.7* 11.3*  HCT 36.0 34.1*  MCV 93.5 92.7  PLT 173 148*   No results for input(s): LABPROT, INR in the last 72 hours.  Assessment: Esophageal  ulcers and esophagitis -EGD yesterday revealed esophageal ulcers with no bleeding and no stigmata of recent bleeding, LA Grade D esophagitis with no bleeding (biopsies pending), a single gastric polyp, erythematous mucosa in the antrum  (biopsies for H. Pylori pending) and small hiatal hernia. -Hgb 11.3, stable  Plan: Patient is on soft diet but is afraid to advance diet and ordered clear liquid breakfast.  Discussed attempting full liquid or soft diet for lunch, as long as it does not exacerbate odynophagia and nausea/vomiting.  Patient asked for surgical referral for hiatal hernia.  Discussed with patient that due to age and other comorbidities, the risks of the surgery may outweigh benefits.  Recommend Protonix 40mg  BID for 2 months, followed by Protonix 40mg  once daily indefinitely.  Continue sucralfate suspension 1 gram PO QID for 2 weeks.  OK to discharge from a GI standpoint when patient is able to tolerate a diet.  Salley Slaughter PA-C 01/04/2020, 9:47 AM  Contact #  [phone]

## 2020-01-04 NOTE — Evaluation (Signed)
Physical Therapy Evaluation Patient Details Name: Rachel Kelly MRN: [id number] DOB: 01/15/1946 Today's Date: 01/04/2020   History of Present Illness  Pt is 74 yo female with hx of HTN, HLD, breast CA s/p R lumpectomy, anemia, pulmonary artery HTN, kidney disease, and OSA.  Pt had recent lumbar surgery at [facility] on 12/06/19.  Pt presented to the ED with persistent vomiting over the last 3-4 days.  She has been admitted with enteritis.  Clinical Impression  Pt has had recent lumbar surgery and now admitted with enteritis.  She reports she had back precautions and is supposed to wear back brace when OOB.  Pt was able to demonstrate all transfers, gait, and stairs safely and with back precautions.  She has been ambulating in room independently.  Pt has intermittent supervision from family at home and necessary DME.  No further acute PT services indicated.     Follow Up Recommendations No PT follow up    Equipment Recommendations  None recommended by PT    Recommendations for Other Services       Precautions / Restrictions Precautions Precautions: Back Precaution Booklet Issued:  (back surgery 1 month ago - pt recalled precautions) Required Braces or Orthoses: Spinal Brace Spinal Brace: Applied in sitting position;Other (comment) Spinal Brace Comments: per pt: for when OOB      Mobility  Bed Mobility               General bed mobility comments: At arrival, pt standing and ambulating in room  Transfers Overall transfer level: Needs assistance Equipment used: None Transfers: Sit to/from Stand Sit to Stand: Independent         General transfer comment: Pt demonstrated safely  Ambulation/Gait Ambulation/Gait assistance: Supervision Gait Distance (Feet): 300 Feet Assistive device: None Gait Pattern/deviations: WFL(Within Functional Limits)     General Gait Details: steady; no LOB  Stairs Stairs: Yes Stairs assistance: Supervision Stair Management: One rail  Left;Alternating pattern Number of Stairs: 4    Wheelchair Mobility    Modified Rankin (Stroke Patients Only)       Balance Overall balance assessment: Needs assistance Sitting-balance support: No upper extremity supported Sitting balance-Leahy Scale: Normal     Standing balance support: No upper extremity supported Standing balance-Leahy Scale: Good                               Pertinent Vitals/Pain Pain Assessment: No/denies pain    Home Living Family/patient expects to be discharged to:: Private residence Living Arrangements: Alone Available Help at Discharge: Family Type of Home: House Home Access: Stairs to enter Entrance Stairs-Rails: Left Entrance Stairs-Number of Steps: 4 Home Layout: One level Home Equipment: Grab bars - tub/shower;Shower seat;Walker - 2 wheels;Cane - single point      Prior Function Level of Independence: Independent         Comments: Pt completely independent.  She did have back surgery on May 18 , 2021 and was still instructed to follow precautions, wear brace when up, and not back to drivign yet     Hand Dominance        Extremity/Trunk Assessment   Upper Extremity Assessment Upper Extremity Assessment: Overall WFL for tasks assessed    Lower Extremity Assessment Lower Extremity Assessment: Overall WFL for tasks assessed    Cervical / Trunk Assessment Cervical / Trunk Assessment: Normal  Communication   Communication: No difficulties  Cognition Arousal/Alertness: Awake/alert Behavior During Therapy: [facility]  for tasks assessed/performed Overall Cognitive Status: Within Functional Limits for tasks assessed                                        General Comments General comments (skin integrity, edema, etc.): Pt verbalized and followed back precautions throughout treatment.  She was also able to don/doff back brace.    Exercises     Assessment/Plan    PT Assessment Patent does not need  any further PT services  PT Problem List         PT Treatment Interventions      PT Goals (Current goals can be found in the Care Plan section)  Acute Rehab PT Goals PT Goal Formulation: All assessment and education complete, DC therapy    Frequency     Barriers to discharge        Co-evaluation               AM-PAC PT "6 Clicks" Mobility  Outcome Measure Help needed turning from your back to your side while in a flat bed without using bedrails?: None Help needed moving from lying on your back to sitting on the side of a flat bed without using bedrails?: None Help needed moving to and from a bed to a chair (including a wheelchair)?: None Help needed standing up from a chair using your arms (e.g., wheelchair or bedside chair)?: None Help needed to walk in hospital room?: None Help needed climbing 3-5 steps with a railing? : None 6 Click Score: 24    End of Session Equipment Utilized During Treatment: Back brace Activity Tolerance: Patient tolerated treatment well Patient left: in chair;with call bell/phone within reach Nurse Communication: Mobility status (safe to continue to ambulate independently)      Time: 2549-8264 PT Time Calculation (min) (ACUTE ONLY): 22 min   Charges:   PT Evaluation $PT Eval Low Complexity: 1 Low          Asianae Minkler, PT Acute Rehab Services Pager [phone] [facility] [phone]    Karlton Lemon 01/04/2020, 4:30 PM

## 2020-01-05 LAB — CBC
HCT: 32.1 % — ABNORMAL LOW (ref 36.0–46.0)
Hemoglobin: 10.6 g/dL — ABNORMAL LOW (ref 12.0–15.0)
MCH: 30.3 pg (ref 26.0–34.0)
MCHC: 33 g/dL (ref 30.0–36.0)
MCV: 91.7 fL (ref 80.0–100.0)
Platelets: 134 10*3/uL — ABNORMAL LOW (ref 150–400)
RBC: 3.5 MIL/uL — ABNORMAL LOW (ref 3.87–5.11)
RDW: 12.2 % (ref 11.5–15.5)
WBC: 6.4 10*3/uL (ref 4.0–10.5)
nRBC: 0 % (ref 0.0–0.2)

## 2020-01-05 LAB — SURGICAL PATHOLOGY

## 2020-01-05 LAB — BASIC METABOLIC PANEL
Anion gap: 7 (ref 5–15)
BUN: 10 mg/dL (ref 8–23)
CO2: 27 mmol/L (ref 22–32)
Calcium: 8.1 mg/dL — ABNORMAL LOW (ref 8.9–10.3)
Chloride: 104 mmol/L (ref 98–111)
Creatinine, Ser: 1.25 mg/dL — ABNORMAL HIGH (ref 0.44–1.00)
GFR calc Af Amer: 49 mL/min — ABNORMAL LOW (ref >60)
GFR calc non Af Amer: 42 mL/min — ABNORMAL LOW (ref >60)
Glucose, Bld: 103 mg/dL — ABNORMAL HIGH (ref 70–99)
Potassium: 3.2 mmol/L — ABNORMAL LOW (ref 3.5–5.1)
Sodium: 138 mmol/L (ref 135–145)

## 2020-01-05 LAB — MAGNESIUM: Magnesium: 1.6 mg/dL — ABNORMAL LOW (ref 1.7–2.4)

## 2020-01-05 MED ORDER — SUCRALFATE 1 GM/10ML PO SUSP: 1.0000 g | Freq: Three times a day (TID) | ORAL | 0 refills | Status: DC

## 2020-01-05 MED ORDER — MAGNESIUM SULFATE 2 GM/50ML IV SOLN
2.0000 g | Freq: Once | INTRAVENOUS | Status: AC
  Administered 2020-01-05: 2 g via INTRAVENOUS
  Filled 2020-01-05: qty 50

## 2020-01-05 MED ORDER — PANTOPRAZOLE SODIUM 40 MG PO TBEC
DELAYED_RELEASE_TABLET | ORAL | 2 refills | Status: DC
Start: 2020-01-05 — End: 2023-01-23

## 2020-01-05 MED ORDER — POTASSIUM CHLORIDE 10 MEQ/100ML IV SOLN
10.0000 meq | INTRAVENOUS | Status: AC
  Administered 2020-01-05 (×4): 10 meq via INTRAVENOUS
  Filled 2020-01-05 (×3): qty 100

## 2020-01-05 NOTE — Discharge Instructions (Signed)
STOP TAKING Ibuprofen/Advil, Naproxen/Naprosyn or related over-the-counter medications because they increase your risk of stomach bleeding

## 2020-01-05 NOTE — Discharge Summary (Signed)
Physician Discharge Summary  Rachel Kelly NLZ:[id number] DOB: 21-May-1946 DOA: 01/01/2020  PCP: [name], MD  Admit date: 01/01/2020 Discharge date: 01/05/2020  Admitted From: Home  Discharge disposition: home    Recommendations for Outpatient Follow-Up:   . Follow up with your primary care provider in one week.  . Check CBC, BMP, magnesium in the next visit . Patient has been prescribed Protonix twice a day for 2 months followed by once daily indefinitely and has been  advised against NSAIDs due to esophageal ulcers. . Patient has chronic back pain and will need orthopedic follow-up after discharge.   Discharge Diagnosis:   Principal Problem:   Enteritis Active Problems:   Hyperlipidemia   GERD (gastroesophageal reflux disease)   Bilateral renal masses   Chest pain   CKD (chronic kidney disease), stage III   Vomiting   Discharge Condition: Improved.  Diet recommendation: Low sodium, heart healthy.  Soft diet for few days.  Wound care: None.  Code status: Full.   History of Present Illness:  Patient is a 74 year old female with history of hypertension, hyperlipidemia, breast cancers/pright lumpectomy, anemia, pulmonary artery hypertension, chronic kidney disease stage IIIb,andOSA not on CPAP presentedto the hospital with complaints of persistent vomiting over the last 3-4 days. Patient reported coffee-ground emesis as well. She did briefly have mild diarrhea initially which  resolved. She denied ny melena but does report that her stool was darker than usual.  Work-up in the emergency room noted white count of 10 K, creatinine of 1.3, high-sensitivity troponin was negative, chest x-ray was unremarkable, CT abdomen pelvis noted some circumferential thickening  bilateral enhancing renal lesions as noted on previous scans. She was seen in the ED 6/11 and urgent care in the past week for this. Patient was then admitted to the hospital for further evaluation and  treatment.  Hospital Course:   Following conditions were addressed during hospitalization as listed below,  Nausea vomiting- improved, likely secondary to esophagitis and ulcers.  Will prescribe Carafate and Protonix on discharge.  Severe esophagitis with esophageal ulcers Patient underwent endoscopic evaluation on 01/03/20 with findings of gastric polyp which was resected.  There was severe esophagitis grade D without bleeding, with esophageal ulcers.  CT scan of the abdomen showed some questionable enteritis, so was on Cipro and Metro was started initially, but clinically not convincing so was discontinued.  GI recommend IV PPI and Carafate with p.o. PPI on discharge.  Patient has tolerated her diet since yesterday so will be considered for discharge today.  Patient was advised to take nausea medicine prior to eating food and stick on a soft diet for the next couple of days.  Chronic kidney disease stage IIIa  -Creatinine is stable at 1.1.   Chronic diastolic CHF Pulmonary hypertension. Resumed on home medication regimen on discharge.  History of breast cancer -Prior lumpectomy, chemo/adjunctive radiation.  Follows up with [name] as outpatient.  Bilateral renal masses -Followed by [name] for several years now  GERD Continue PPI twice a day, sucralfate on discharge  Recent back surgery Physical therapy evaluation determined no skilled therapy needs, follow-up with Dr.Cohen, orthopedics as outpatient.    Disposition.  At this time, patient is stable for disposition home.  She was advised to follow-up with her primary care physician and with orthopedics as outpatient.  Medical Consultants:    GI  Procedures:    Upper GI endoscopy with biopsy and removal of gastric polyp on 01/03/2020 by GI.  Subjective:  Today, patient feels okay.  Has tolerated oral diet since yesterday evening.  Complains of mild back pain.  Discharge Exam:   Vitals:   01/04/20 2057  01/05/20 0452  BP: 123/69 122/66  Pulse: 71 63  Resp: 17 16  Temp: 98.9 F (37.2 C) 98.3 F (36.8 C)  SpO2: 97% 94%   Vitals:   01/04/20 1400 01/04/20 1756 01/04/20 2057 01/05/20 0452  BP: 101/71 107/67 123/69 122/66  Pulse: 89 74 71 63  Resp: 16 20 17 16   Temp: 98.6 F (37 C) 98.2 F (36.8 C) 98.9 F (37.2 C) 98.3 F (36.8 C)  TempSrc: Oral Oral Oral   SpO2: 98% 99% 97% 94%  Weight:      Height:       General: Alert awake, not in obvious distress, obese HENT: pupils equally reacting to light,  No scleral pallor or icterus noted. Oral mucosa is moist.  Chest:  Clear breath sounds.  Diminished breath sounds bilaterally. No crackles or wheezes.  CVS: S1 &S2 heard. No murmur.  Regular rate and rhythm. Abdomen: Soft, nontender, nondistended.  Bowel sounds are heard.   Extremities: No cyanosis, clubbing or edema.  Peripheral pulses are palpable. Psych: Alert, awake and oriented, normal mood CNS:  No cranial nerve deficits.  Power equal in all extremities.   Skin: Warm and dry.  No rashes noted.  The results of significant diagnostics from this hospitalization (including imaging, microbiology, ancillary and laboratory) are listed below for reference.     Diagnostic Studies:   DG Chest 2 View  Result Date: 01/01/2020 CLINICAL DATA:  Centralized chest pain. EXAM: CHEST - 2 VIEW COMPARISON:  09/07/2019; chest CT-01/15/2012 FINDINGS: Grossly unchanged borderline enlarged cardiac silhouette and mediastinal contours with calcified mediastinal and hilar lymph nodes. Redemonstrated mild diffuse slightly nodular thickening of the pulmonary interstitium. No discrete focal airspace opacities. No pleural effusion or pneumothorax. No evidence of edema. No acute osseous abnormalities. Post lower lumbar paraspinal fusion, incompletely evaluated. Stigmata of dish within the thoracic spine. Surgical clips overlie the inferolateral aspect of the right breast. IMPRESSION: 1. Chronic bronchitic change  without superimposed acute cardiopulmonary disease. 2. Redemonstrated calcified mediastinal and hilar lymph nodes, likely the sequela of previous granulomatous infection. Electronically Signed   By: [name]   On: 01/01/2020 06:24   CT Abdomen Pelvis W Contrast  Result Date: 01/01/2020 CLINICAL DATA:  Abdominal pain and vomiting since this past Tuesday. EXAM: CT ABDOMEN AND PELVIS WITH CONTRAST TECHNIQUE: Multidetector CT imaging of the abdomen and pelvis was performed using the standard protocol following bolus administration of intravenous contrast. CONTRAST:  13mL OMNIPAQUE IOHEXOL 300 MG/ML  SOLN COMPARISON:  08/04/2019; 11/21/2012 FINDINGS: Lower chest: Limited visualization of the lower thorax is negative for focal airspace opacity or pleural effusion. Cardiomegaly.  No pericardial effusion. Hepatobiliary: Normal hepatic contour. There is mild diffuse decreased attenuation hepatic parenchyma on this postcontrast examination suggestive of hepatic steatosis. There is a minimal amount of focal fatty infiltration adjacent to the fissure for the ligamentum teres. Normal appearance of the gallbladder. No radiopaque gallstones. No intra or extrahepatic biliary ductal dilatation. No ascites. Pancreas: Normal appearance of the pancreas. Spleen: Normal appearance of the spleen. Adrenals/Urinary Tract: There is symmetric enhancement and excretion of the bilateral kidneys. There is an approximately 1.7 x 1.4 x 1.5 cm enhancing partially exophytic lesion arising from the interpolar lateral aspect of the right kidney (axial image 31, series 3; coronal image 59, series 6), as well as a 1.7 x  1.6 x 1.5 cm heterogeneously enhancing lesion involving the inferior pole of the left kidney (axial image 37, series 3; coronal image 52, series 6) and approximately 1.3 x 1.3 x 0.9 cm partially enhancing lesion involving the interpolar aspect of the left kidney (axial image 31, series 3; coronal image 56, series 6), all of  which appears similar to the 07/2018 examination and remain worrisome for renal cell carcinomas. While additional renal lesions are too small to accurately characterize several lesions appear to demonstrate enhancement and are worrisome for additional areas of renal cell carcinoma (representative coronal images 53 and 56, series 6). The bilateral renal veins appear patent. No definite evidence of nephrolithiasis on this postcontrast examination. No urine obstruction or perinephric stranding. Normal appearance the bilateral adrenal glands. Normal appearance of the urinary bladder given underdistention. Stomach/Bowel: There is moderate circumferential wall thickening involving the majority of the colon extending from the level of the hepatic flexure to the rectum, potentially accentuated due to underdistention though could be seen in the setting of an enteritis. No associated enteric obstruction. Otherwise, there are no discrete areas of bowel wall thickening. Normal appearance of the terminal ileum and the retrocecal appendix. Redemonstrated poor surgical change of the GE junction with persistent patulous distension of distal aspect of the esophagus. No pneumoperitoneum, pneumatosis or portal venous gas. Vascular/Lymphatic: Atherosclerotic plaque within a tortuous but normal caliber abdominal aorta. No bulky retroperitoneal, mesenteric, pelvic or inguinal lymphadenopathy. Reproductive: Post hysterectomy.  No discrete adnexal lesion. Other: Tiny mesenteric fat containing peri umbilical hernia. Musculoskeletal: No acute or aggressive osseous abnormalities. Post L4-L5 paraspinal fusion and intervertebral disc space replacement with left lateral fusion and intervertebral disc space replacement of the L3-L4 vertebral body. No definite evidence of hardware failure or loosening. Stigmata of dish within thoracic spine. IMPRESSION: 1. Apparent circumferential wall thickening involving the majority of the colon, potentially  accentuated due to underdistention though conceivably an enteritis could have a similar appearance. No evidence of enteric obstruction. 2. Bilateral enhancing lesions (right renal lesion measuring 1.2 cm, left-sided renal lesions measuring 1.7 and 1.3 cm) worrisome for bilateral renal cell carcinomas, similar to dedicated renal protocol CT scan performed 07/2019. No evidence of renal vein involvement or additional metastasis. 3. Additional smaller (subcentimeter) though potentially enhancing bilateral renal lesions which are too small to accurately characterize though are also worrisome for additional renal cell carcinomas and warrant continued dedicated follow-up at the discretion of [name]. 4. Aortic Atherosclerosis (ICD10-I70.0). Electronically Signed   By: [name]   On: 01/01/2020 10:47     Labs:   Basic Metabolic Panel: Recent Labs  Lab 12/30/19 1552 12/30/19 1552 01/01/20 0558 01/01/20 0558 01/02/20 0444 01/02/20 0444 01/03/20 0453 01/05/20 0150  NA 139  --  143  --  142  --  140 138  K 4.5   < > 3.2*   < > 3.2*   < > 3.5 3.2*  CL 100  --  100  --  101  --  105 104  CO2 25  --  25  --  30  --  27 27  GLUCOSE 114*  --  140*  --  119*  --  110* 103*  BUN 22  --  25*  --  20  --  14 10  CREATININE 1.47*  --  1.30*  --  1.11*  --  1.11* 1.25*  CALCIUM 10.1  --  9.6  --  9.1  --  8.4* 8.1*  MG  --   --   --   --   --   --   --  1.6*   < > = values in this interval not displayed.   GFR Estimated Creatinine Clearance: 33.5 mL/min (A) (by C-G formula based on SCr of 1.25 mg/dL (H)). Liver Function Tests: Recent Labs  Lab 12/30/19 1552 01/01/20 0942 01/02/20 0444  AST 27 22 16   ALT 16 15 12   ALKPHOS 102 76 59  BILITOT 1.2 1.0 1.0  PROT 8.2* 8.0 6.6  ALBUMIN 4.2 4.2 3.2*   Recent Labs  Lab 01/01/20 0942  LIPASE 19   No results for input(s): AMMONIA in the last 168 hours. Coagulation profile Recent Labs  Lab 01/01/20 0558  INR 1.2    CBC: Recent Labs    Lab 12/30/19 1552 01/01/20 0558 01/02/20 0444 01/03/20 0453 01/05/20 0150  WBC 9.0 10.0 8.3 5.6 6.4  NEUTROABS 7.6  --  6.3  --   --   HGB 13.5 13.5 11.7* 11.3* 10.6*  HCT 40.9 40.9 36.0 34.1* 32.1*  MCV 92.1 93.6 93.5 92.7 91.7  PLT 246 220 173 148* 134*   Cardiac Enzymes: No results for input(s): CKTOTAL, CKMB, CKMBINDEX, TROPONINI in the last 168 hours. BNP: Invalid input(s): POCBNP CBG: No results for input(s): GLUCAP in the last 168 hours. D-Dimer No results for input(s): DDIMER in the last 72 hours. Hgb A1c No results for input(s): HGBA1C in the last 72 hours. Lipid Profile No results for input(s): CHOL, HDL, LDLCALC, TRIG, CHOLHDL, LDLDIRECT in the last 72 hours. Thyroid function studies No results for input(s): TSH, T4TOTAL, T3FREE, THYROIDAB in the last 72 hours.  Invalid input(s): FREET3 Anemia work up No results for input(s): VITAMINB12, FOLATE, FERRITIN, TIBC, IRON, RETICCTPCT in the last 72 hours. Microbiology Recent Results (from the past 240 hour(s))  SARS Coronavirus 2 by RT PCR (hospital order, performed in [facility] hospital lab) Nasopharyngeal Nasopharyngeal Swab     Status: None   Collection Time: 01/01/20 12:03 PM   Specimen: Nasopharyngeal Swab  Result Value Ref Range Status   SARS Coronavirus 2 NEGATIVE NEGATIVE Final    Comment: (NOTE) SARS-CoV-2 target nucleic acids are NOT DETECTED.  The SARS-CoV-2 RNA is generally detectable in upper and lower respiratory specimens during the acute phase of infection. The lowest concentration of SARS-CoV-2 viral copies this assay can detect is 250 copies / mL. A negative result does not preclude SARS-CoV-2 infection and should not be used as the sole basis for treatment or other patient management decisions.  A negative result may occur with improper specimen collection / handling, submission of specimen other than nasopharyngeal swab, presence of viral mutation(s) within the areas targeted by this  assay, and inadequate number of viral copies (<250 copies / mL). A negative result must be combined with clinical observations, patient history, and epidemiological information.  Fact Sheet for Patients:   StrictlyIdeas.no  Fact Sheet for Healthcare Providers: BankingDealers.co.za  This test is not yet approved or  cleared by the [place] FDA and has been authorized for detection and/or diagnosis of SARS-CoV-2 by FDA under an Emergency Use Authorization (EUA).  This EUA will remain in effect (meaning this test can be used) for the duration of the COVID-19 declaration under Section 564(b)(1) of the Act, 21 U.S.C. section 360bbb-3(b)(1), unless the authorization is terminated or revoked sooner.  Performed at [facility], [place] [address] [place], [place] [address]      Discharge Instructions:   Discharge Instructions    Diet - low sodium heart healthy   Complete by: As directed    Soft  diet for 1-2 days.   Discharge instructions   Complete by: As directed    Please take protonix twice a day for 2 months then once daily indefinitely. Follow up with your primary care provider in 1-2 weeks and check blood work at that time. Follow up with GI, Dr Michail Sermon in 2-3 weeks. Dot take over-the-counter pain medication including Motrin Aleve. Ok to take tylenol for pain. Seek medical attention for worsening symptoms. Take nausea medication 15-20 minutes prior to taking meals.   Increase activity slowly   Complete by: As directed      Allergies as of 01/05/2020      Reactions   Shellfish Allergy Other (See Comments)   Pain all over   Enbucrilate Itching, Rash   steri strips   Penicillins Rash   Fine red bumps that spread all over the body. Has patient had a PCN reaction causing immediate rash, facial/tongue/throat swelling, SOB or lightheadedness with hypotension: No Has patient had a PCN reaction causing severe rash involving  mucus membranes or skin necrosis: No Has patient had a PCN reaction that required hospitalization: No Has patient had a PCN reaction occurring within the last 10 years: No If all of the above answers are "NO", then may proceed with Cephalosporin use.   Skin Adhesives [cyanoacrylate] Itching, Rash   steri strips      Medication List    STOP taking these medications   ibuprofen 200 MG tablet Commonly known as: ADVIL   omeprazole 20 MG capsule Commonly known as: PRILOSEC     TAKE these medications   Alaway 0.025 % ophthalmic solution Generic drug: ketotifen Place 1 drop into both eyes 2 (two) times daily as needed (itchy, dry eyes).   aspirin-sod bicarb-citric acid 325 MG Tbef tablet Commonly known as: ALKA-SELTZER Take 325 mg by mouth every 6 (six) hours as needed (cold symptoms).   Coenzyme Q10 200 MG Tabs Take 200 mg by mouth daily.   gabapentin 100 MG capsule Commonly known as: NEURONTIN Take 1 capsule (100 mg total) by mouth at bedtime.   hydrocortisone 2.5 % cream Apply 1 application topically 2 (two) times daily as needed (dry skin).   Imodium A-D 2 MG capsule Generic drug: loperamide Take 2 mg by mouth daily as needed for diarrhea or loose stools.   methocarbamol 500 MG tablet Commonly known as: ROBAXIN Take 500 mg by mouth 3 (three) times daily as needed for pain.   ondansetron 4 MG disintegrating tablet Commonly known as: Zofran ODT Take 1 tablet (4 mg total) by mouth every 8 (eight) hours as needed for nausea or vomiting.   pantoprazole 40 MG tablet Commonly known as: Protonix Take 1 tablet (40 mg total) by mouth 2 (two) times daily before a meal for 60 days, THEN 1 tablet (40 mg total) daily. Start taking on: January 05, 2020   potassium gluconate 595 (99 K) MG Tabs tablet Take 595 mg by mouth daily.   Refresh 1.4-0.6 % Soln Generic drug: Polyvinyl Alcohol-Povidone PF Place 1 drop into both eyes daily as needed (dry eyes).   simvastatin 20 MG  tablet Commonly known as: ZOCOR Take 20 mg by mouth every morning.   sucralfate 1 GM/10ML suspension Commonly known as: CARAFATE Take 10 mLs (1 g total) by mouth 4 (four) times daily -  with meals and at bedtime for 11 days.   triamterene-hydrochlorothiazide 37.5-25 MG tablet Commonly known as: MAXZIDE-25 Take 1 tablet by mouth every morning.  Follow-up Information    Wilford Corner, MD. Schedule an appointment as soon as possible for a visit in 2 week(s).   Specialty: Gastroenterology Why: follow up of ulcers Contact information: [address] [place] [address] [phone]               Time coordinating discharge: 39 minutes  Signed:  Jermar Colter  [facility] 01/05/2020, 11:12 AM

## 2020-01-05 NOTE — Progress Notes (Signed)
Patient discharged to home with instructions. 

## 2020-01-09 DIAGNOSIS — M4326 Fusion of spine, lumbar region: Secondary | ICD-10-CM | POA: Diagnosis not present

## 2020-01-10 DIAGNOSIS — R112 Nausea with vomiting, unspecified: Secondary | ICD-10-CM | POA: Diagnosis not present

## 2020-02-06 DIAGNOSIS — R197 Diarrhea, unspecified: Secondary | ICD-10-CM | POA: Diagnosis not present

## 2020-02-06 DIAGNOSIS — R194 Change in bowel habit: Secondary | ICD-10-CM | POA: Diagnosis not present

## 2020-02-06 DIAGNOSIS — R159 Full incontinence of feces: Secondary | ICD-10-CM | POA: Diagnosis not present

## 2020-02-06 DIAGNOSIS — R112 Nausea with vomiting, unspecified: Secondary | ICD-10-CM | POA: Diagnosis not present

## 2020-02-28 ENCOUNTER — Other Ambulatory Visit: Payer: Self-pay

## 2020-02-28 ENCOUNTER — Other Ambulatory Visit: Payer: Medicare PPO

## 2020-02-28 DIAGNOSIS — Z20822 Contact with and (suspected) exposure to covid-19: Secondary | ICD-10-CM

## 2020-02-29 LAB — SARS-COV-2, NAA 2 DAY TAT

## 2020-02-29 LAB — NOVEL CORONAVIRUS, NAA: SARS-CoV-2, NAA: NOT DETECTED

## 2020-03-05 ENCOUNTER — Ambulatory Visit: Payer: Medicare PPO | Admitting: Internal Medicine

## 2020-03-11 NOTE — Patient Instructions (Addendum)
Nice seeing you today Rachel Kelly - Seen today for follow-up sleep apnea and sarcoidosis   Recommendations: - Great job working on [employer] loss, you are down 20 lbs since your sleep study in 2013/ We can repeat Home sleep study to confirmed whether or not you still have sleep apnea, we will leave this up to you. If you notice trouble sleeping, snoring, apnea or shortness of breath at night let us know  - Do not drive if experiencing excessive daytime fatigue or somnolence   Follow-up: 1 year with Dr. Annamaria Boots

## 2020-03-11 NOTE — Progress Notes (Signed)
@Patient  ID: Rachel Kelly, female    DOB: May 11, 1946, 74 y.o.   MRN: [id number]  Chief Complaint  Patient presents with  . Follow-up    Pt states she has been doing okay since last visit. Pt was admitted to hospital in June 2021 due to enteritis.     Referring provider: [name], MD  HPI: 74 year old female, never smoked. PMH significant for OSA (CPAP intolerant), sarcoidosis, hypertension, GERD, hyperlipidemia, breast cancer. Patient of Dr. Annamaria Boots, last seen by pulmonary NP on 06/21/18. NPSG- 05/26/12- AHI 17.6/ hr. Moderate obstructive sleep apnea, body weight 176 pounds.   Previous [facility] pulmonary encounters: 09/07/2019 Patient presents today for pre-op clearance. She follow-ups with our office for history of sleep apnea and sarcoidosis. She is doing well, no acute respiratory complaints or active sarcoidosis symptoms. She is not on CPAP therapy d/t intolerance. She is sleeping well at night, wakes up to use bathroom 1-2 times at night. No nocturnal shortness of breath. Spirometry in 2019 showed no evidence of airway restriction or obstruction. Denies shortness of breath, chest tightness, chest pain, wheezing or cough.   03/12/2020- Interim hx Patient presents today for 6 month follow-up.  She is doing well, no respiratory symptoms. She was recently seen in ED for gastritis. She had back surgery in May. States that her surgery went well without complications. She is intolerant to CPAP, denies nocturnal symptoms. She has been working on weight loss. Current weight is 156lb, she is down 10 lbs since last visit and  20 labs since her sleep study in 2013. Denies shortness of breath, chest tightnes/pain, cough or wheezing.    Allergies  Allergen Reactions  . Shellfish Allergy Other (See Comments)    Pain all over  . Enbucrilate Itching and Rash    steri strips  . Penicillins Rash    Fine red bumps that spread all over the body. Has patient had a PCN reaction causing immediate rash,  facial/tongue/throat swelling, SOB or lightheadedness with hypotension: No Has patient had a PCN reaction causing severe rash involving mucus membranes or skin necrosis: No Has patient had a PCN reaction that required hospitalization: No Has patient had a PCN reaction occurring within the last 10 years: No If all of the above answers are "NO", then may proceed with Cephalosporin use.   . Skin Adhesives [Cyanoacrylate] Itching and Rash    steri strips    Immunization History  Administered Date(s) Administered  . Hepatitis A, Adult 08/18/2017  . Influenza Split 06/07/2012, 05/22/2013  . Influenza, High Dose Seasonal PF 04/21/2018, 05/11/2019  . Influenza-Unspecified 06/20/2014  . PFIZER SARS-COV-2 Vaccination 08/12/2019, 09/02/2019  . Zoster 06/20/2014    Past Medical History:  Diagnosis Date  . Anemia   . Blood transfusion 2005  . Family history of breast cancer   . Family history of prostate cancer   . Hernia   . HH (hiatus hernia)   . Hyperlipidemia   . Hypertension   . PAH (pulmonary artery hypertension) ([facility])   . Personal history of chemotherapy   . Personal history of radiation therapy   . Sarcoidosis   . Shortness of breath    on exertion  . Sleep apnea    does not use cpap  . Wheezing     Tobacco History: Social History   Tobacco Use  Smoking Status Never Smoker  Smokeless Tobacco Never Used   Counseling given: Not Answered   Outpatient Medications Prior to Visit  Medication Sig Dispense Refill  .  aspirin-sod bicarb-citric acid (ALKA-SELTZER) 325 MG TBEF tablet Take 325 mg by mouth every 6 (six) hours as needed (cold symptoms).     . Coenzyme Q10 200 MG TABS Take 200 mg by mouth daily.    Marland Kitchen gabapentin (NEURONTIN) 100 MG capsule Take 1 capsule (100 mg total) by mouth at bedtime. 90 capsule 4  . hydrocortisone 2.5 % cream Apply 1 application topically 2 (two) times daily as needed (dry skin).    Marland Kitchen ketotifen (ALAWAY) 0.025 % ophthalmic solution Place 1 drop  into both eyes 2 (two) times daily as needed (itchy, dry eyes).     Marland Kitchen loperamide (IMODIUM A-D) 2 MG capsule Take 2 mg by mouth daily as needed for diarrhea or loose stools.    . methocarbamol (ROBAXIN) 500 MG tablet Take 500 mg by mouth 3 (three) times daily as needed for pain.    Marland Kitchen ondansetron (ZOFRAN ODT) 4 MG disintegrating tablet Take 1 tablet (4 mg total) by mouth every 8 (eight) hours as needed for nausea or vomiting. 20 tablet 0  . pantoprazole (PROTONIX) 40 MG tablet Take 1 tablet (40 mg total) by mouth 2 (two) times daily before a meal for 60 days, THEN 1 tablet (40 mg total) daily. 180 tablet 2  . Polyvinyl Alcohol-Povidone PF (REFRESH) 1.4-0.6 % SOLN Place 1 drop into both eyes daily as needed (dry eyes).     . potassium gluconate 595 MG TABS Take 595 mg by mouth daily.     . simvastatin (ZOCOR) 20 MG tablet Take 20 mg by mouth every morning.     . triamterene-hydrochlorothiazide (MAXZIDE-25) 37.5-25 MG per tablet Take 1 tablet by mouth every morning.     . sucralfate (CARAFATE) 1 GM/10ML suspension Take 10 mLs (1 g total) by mouth 4 (four) times daily -  with meals and at bedtime for 11 days. 420 mL 0   No facility-administered medications prior to visit.   Review of Systems  Review of Systems  Constitutional: Negative.   Respiratory: Negative for cough, chest tightness, shortness of breath and wheezing.   Psychiatric/Behavioral: Negative for sleep disturbance.   Physical Exam  BP 118/74 (BP Location: Left Arm, Cuff Size: Normal)   Pulse 68   Ht 4' 11.25" (1.505 m)   Wt 156 lb 6.4 oz (70.9 kg)   SpO2 98%   BMI 31.32 kg/m  Physical Exam Constitutional:      General: She is not in acute distress.    Appearance: Normal appearance. She is not ill-appearing.  HENT:     Mouth/Throat:     Mouth: Mucous membranes are moist.     Pharynx: Oropharynx is clear.  Cardiovascular:     Rate and Rhythm: Normal rate and regular rhythm.  Pulmonary:     Effort: Pulmonary effort is  normal.     Breath sounds: Normal breath sounds.  Skin:    Comments: No skin lesions  Neurological:     General: No focal deficit present.     Mental Status: She is alert and oriented to person, place, and time. Mental status is at baseline.  Psychiatric:        Mood and Affect: Mood normal.        Behavior: Behavior normal.        Thought Content: Thought content normal.        Judgment: Judgment normal.      Lab Results:  CBC    Component Value Date/Time   WBC 6.4 01/05/2020 0150  RBC 3.50 (L) 01/05/2020 0150   HGB 10.6 (L) 01/05/2020 0150   HGB 10.1 (L) 08/16/2018 1155   HCT 32.1 (L) 01/05/2020 0150   PLT 134 (L) 01/05/2020 0150   PLT 264 08/16/2018 1155   MCV 91.7 01/05/2020 0150   MCH 30.3 01/05/2020 0150   MCHC 33.0 01/05/2020 0150   RDW 12.2 01/05/2020 0150   LYMPHSABS 1.2 01/02/2020 0444   MONOABS 0.8 01/02/2020 0444   EOSABS 0.0 01/02/2020 0444   BASOSABS 0.0 01/02/2020 0444    BMET    Component Value Date/Time   NA 138 01/05/2020 0150   K 3.2 (L) 01/05/2020 0150   CL 104 01/05/2020 0150   CO2 27 01/05/2020 0150   GLUCOSE 103 (H) 01/05/2020 0150   BUN 10 01/05/2020 0150   CREATININE 1.25 (H) 01/05/2020 0150   CREATININE 1.18 (H) 01/17/2019 1305   CALCIUM 8.1 (L) 01/05/2020 0150   GFRNONAA 42 (L) 01/05/2020 0150   GFRNONAA 45 (L) 01/17/2019 1305   GFRAA 49 (L) 01/05/2020 0150   GFRAA 53 (L) 01/17/2019 1305    BNP No results found for: BNP  ProBNP No results found for: PROBNP  Imaging: No results found.   Assessment & Plan:   Obstructive sleep apnea - NPSG in 2013 showed moderate OSA, AHI 17.6/hr. She is intolerant to CPAP, down 20 lbs since sleep study. -  Clinically her symptoms are controlled. We we leave it up to patient whether or not she wants to repeat HST to confirm is she still has sleep apnea. Advised her if she notices trouble sleeping, snoring, apnea or shortness of breath at night let our office know. Do not drive if  experiencing excessive daytime fatigue or somnolence    Sarcoidosis - No symptoms of active sarcoidosis. Denies shortness of breath, cough, chest tightness or pain. Patient to follow-up for yearly eye exam with her ophthalmologist Dr. Kathlen Mody.    Martyn Ehrich, NP 03/12/2020

## 2020-03-12 ENCOUNTER — Other Ambulatory Visit: Payer: Self-pay

## 2020-03-12 ENCOUNTER — Ambulatory Visit: Payer: Medicare PPO | Admitting: Primary Care

## 2020-03-12 ENCOUNTER — Encounter: Payer: Self-pay | Admitting: Primary Care

## 2020-03-12 DIAGNOSIS — N183 Chronic kidney disease, stage 3 unspecified: Secondary | ICD-10-CM | POA: Diagnosis not present

## 2020-03-12 DIAGNOSIS — D869 Sarcoidosis, unspecified: Secondary | ICD-10-CM | POA: Diagnosis not present

## 2020-03-12 DIAGNOSIS — R151 Fecal smearing: Secondary | ICD-10-CM | POA: Diagnosis not present

## 2020-03-12 DIAGNOSIS — G4733 Obstructive sleep apnea (adult) (pediatric): Secondary | ICD-10-CM | POA: Diagnosis not present

## 2020-03-12 NOTE — Assessment & Plan Note (Signed)
-   NPSG in 2013 showed moderate OSA, AHI 17.6/hr. She is intolerant to CPAP, down 20 lbs since sleep study. -  Clinically her symptoms are controlled. We we leave it up to patient whether or not she wants to repeat HST to confirm is she still has sleep apnea. Advised her if she notices trouble sleeping, snoring, apnea or shortness of breath at night let our office know. Do not drive if experiencing excessive daytime fatigue or somnolence

## 2020-03-12 NOTE — Assessment & Plan Note (Signed)
-   No symptoms of active sarcoidosis. Denies shortness of breath, cough, chest tightness or pain. Patient to follow-up for yearly eye exam with her ophthalmologist Dr. Kathlen Mody.

## 2020-03-14 DIAGNOSIS — M4716 Other spondylosis with myelopathy, lumbar region: Secondary | ICD-10-CM | POA: Diagnosis not present

## 2020-03-14 DIAGNOSIS — M4326 Fusion of spine, lumbar region: Secondary | ICD-10-CM | POA: Diagnosis not present

## 2020-03-14 DIAGNOSIS — M4316 Spondylolisthesis, lumbar region: Secondary | ICD-10-CM | POA: Diagnosis not present

## 2020-03-14 DIAGNOSIS — M48062 Spinal stenosis, lumbar region with neurogenic claudication: Secondary | ICD-10-CM | POA: Diagnosis not present

## 2020-03-20 DIAGNOSIS — Z8249 Family history of ischemic heart disease and other diseases of the circulatory system: Secondary | ICD-10-CM | POA: Diagnosis not present

## 2020-03-20 DIAGNOSIS — Z853 Personal history of malignant neoplasm of breast: Secondary | ICD-10-CM | POA: Diagnosis not present

## 2020-03-20 DIAGNOSIS — K209 Esophagitis, unspecified without bleeding: Secondary | ICD-10-CM | POA: Diagnosis not present

## 2020-03-20 DIAGNOSIS — G629 Polyneuropathy, unspecified: Secondary | ICD-10-CM | POA: Diagnosis not present

## 2020-03-20 DIAGNOSIS — E785 Hyperlipidemia, unspecified: Secondary | ICD-10-CM | POA: Diagnosis not present

## 2020-03-20 DIAGNOSIS — I10 Essential (primary) hypertension: Secondary | ICD-10-CM | POA: Diagnosis not present

## 2020-03-20 DIAGNOSIS — Z6832 Body mass index (BMI) 32.0-32.9, adult: Secondary | ICD-10-CM | POA: Diagnosis not present

## 2020-03-20 DIAGNOSIS — E669 Obesity, unspecified: Secondary | ICD-10-CM | POA: Diagnosis not present

## 2020-03-20 DIAGNOSIS — Z823 Family history of stroke: Secondary | ICD-10-CM | POA: Diagnosis not present

## 2020-04-04 DIAGNOSIS — Z01419 Encounter for gynecological examination (general) (routine) without abnormal findings: Secondary | ICD-10-CM | POA: Diagnosis not present

## 2020-04-24 ENCOUNTER — Ambulatory Visit: Payer: Medicare PPO

## 2020-05-01 ENCOUNTER — Ambulatory Visit: Payer: Medicare PPO | Attending: Internal Medicine

## 2020-05-01 DIAGNOSIS — Z23 Encounter for immunization: Secondary | ICD-10-CM

## 2020-05-01 NOTE — Progress Notes (Signed)
   Covid-19 Vaccination Clinic  Name:  NECHAMA ESCUTIA    MRN: [id number] DOB: 1946-04-09  05/01/2020  Ms. Marcucci was observed post Covid-19 immunization for 15 minutes without incident. She was provided with Vaccine Information Sheet and instruction to access the V-Safe system.   Ms. Landrigan was instructed to call 911 with any severe reactions post vaccine: Marland Kitchen Difficulty breathing  . Swelling of face and throat  . A fast heartbeat  . A bad rash all over body  . Dizziness and weakness

## 2020-05-02 ENCOUNTER — Other Ambulatory Visit: Payer: Self-pay | Admitting: Adult Health

## 2020-05-02 ENCOUNTER — Other Ambulatory Visit: Payer: Self-pay | Admitting: Family Medicine

## 2020-05-02 DIAGNOSIS — Z853 Personal history of malignant neoplasm of breast: Secondary | ICD-10-CM

## 2020-05-21 DIAGNOSIS — M5431 Sciatica, right side: Secondary | ICD-10-CM | POA: Diagnosis not present

## 2020-05-21 DIAGNOSIS — M4326 Fusion of spine, lumbar region: Secondary | ICD-10-CM | POA: Diagnosis not present

## 2020-05-21 DIAGNOSIS — M6281 Muscle weakness (generalized): Secondary | ICD-10-CM | POA: Diagnosis not present

## 2020-05-29 DIAGNOSIS — M6281 Muscle weakness (generalized): Secondary | ICD-10-CM | POA: Diagnosis not present

## 2020-05-29 DIAGNOSIS — M5431 Sciatica, right side: Secondary | ICD-10-CM | POA: Diagnosis not present

## 2020-05-29 DIAGNOSIS — M4326 Fusion of spine, lumbar region: Secondary | ICD-10-CM | POA: Diagnosis not present

## 2020-06-06 DIAGNOSIS — N8111 Cystocele, midline: Secondary | ICD-10-CM | POA: Diagnosis not present

## 2020-06-12 DIAGNOSIS — K219 Gastro-esophageal reflux disease without esophagitis: Secondary | ICD-10-CM | POA: Diagnosis not present

## 2020-06-12 DIAGNOSIS — C50411 Malignant neoplasm of upper-outer quadrant of right female breast: Secondary | ICD-10-CM | POA: Diagnosis not present

## 2020-06-12 DIAGNOSIS — K589 Irritable bowel syndrome without diarrhea: Secondary | ICD-10-CM | POA: Diagnosis not present

## 2020-06-12 DIAGNOSIS — E785 Hyperlipidemia, unspecified: Secondary | ICD-10-CM | POA: Diagnosis not present

## 2020-06-12 DIAGNOSIS — N183 Chronic kidney disease, stage 3 unspecified: Secondary | ICD-10-CM | POA: Diagnosis not present

## 2020-06-12 DIAGNOSIS — D860 Sarcoidosis of lung: Secondary | ICD-10-CM | POA: Diagnosis not present

## 2020-06-12 DIAGNOSIS — M1830 Unilateral post-traumatic osteoarthritis of first carpometacarpal joint, unspecified hand: Secondary | ICD-10-CM | POA: Diagnosis not present

## 2020-06-18 DIAGNOSIS — N8111 Cystocele, midline: Secondary | ICD-10-CM | POA: Diagnosis not present

## 2020-06-19 ENCOUNTER — Other Ambulatory Visit: Payer: Self-pay

## 2020-06-19 ENCOUNTER — Ambulatory Visit
Admission: RE | Admit: 2020-06-19 | Discharge: 2020-06-19 | Disposition: A | Discharge status: Home / Self Care | Payer: Medicare PPO | Source: Ambulatory Visit | Attending: Adult Health | Admitting: Adult Health

## 2020-06-19 DIAGNOSIS — Z853 Personal history of malignant neoplasm of breast: Secondary | ICD-10-CM

## 2020-06-26 DIAGNOSIS — M5431 Sciatica, right side: Secondary | ICD-10-CM | POA: Diagnosis not present

## 2020-06-26 DIAGNOSIS — M4326 Fusion of spine, lumbar region: Secondary | ICD-10-CM | POA: Diagnosis not present

## 2020-06-26 DIAGNOSIS — M6281 Muscle weakness (generalized): Secondary | ICD-10-CM | POA: Diagnosis not present

## 2020-06-28 DIAGNOSIS — H04123 Dry eye syndrome of bilateral lacrimal glands: Secondary | ICD-10-CM | POA: Diagnosis not present

## 2020-06-28 DIAGNOSIS — M48062 Spinal stenosis, lumbar region with neurogenic claudication: Secondary | ICD-10-CM | POA: Diagnosis not present

## 2020-06-28 DIAGNOSIS — H43813 Vitreous degeneration, bilateral: Secondary | ICD-10-CM | POA: Diagnosis not present

## 2020-06-28 DIAGNOSIS — M4316 Spondylolisthesis, lumbar region: Secondary | ICD-10-CM | POA: Diagnosis not present

## 2020-06-28 DIAGNOSIS — Z961 Presence of intraocular lens: Secondary | ICD-10-CM | POA: Diagnosis not present

## 2020-06-28 DIAGNOSIS — M4716 Other spondylosis with myelopathy, lumbar region: Secondary | ICD-10-CM | POA: Diagnosis not present

## 2020-06-28 DIAGNOSIS — M4326 Fusion of spine, lumbar region: Secondary | ICD-10-CM | POA: Diagnosis not present

## 2020-06-28 DIAGNOSIS — H402231 Chronic angle-closure glaucoma, bilateral, mild stage: Secondary | ICD-10-CM | POA: Diagnosis not present

## 2020-07-03 DIAGNOSIS — Z4689 Encounter for fitting and adjustment of other specified devices: Secondary | ICD-10-CM | POA: Diagnosis not present

## 2020-07-03 DIAGNOSIS — N819 Female genital prolapse, unspecified: Secondary | ICD-10-CM | POA: Diagnosis not present

## 2020-07-03 DIAGNOSIS — N8111 Cystocele, midline: Secondary | ICD-10-CM | POA: Diagnosis not present

## 2020-08-07 DIAGNOSIS — D4101 Neoplasm of uncertain behavior of right kidney: Secondary | ICD-10-CM | POA: Diagnosis not present

## 2020-08-07 DIAGNOSIS — D4102 Neoplasm of uncertain behavior of left kidney: Secondary | ICD-10-CM | POA: Diagnosis not present

## 2020-08-13 DIAGNOSIS — R151 Fecal smearing: Secondary | ICD-10-CM | POA: Diagnosis not present

## 2020-08-13 DIAGNOSIS — N183 Chronic kidney disease, stage 3 unspecified: Secondary | ICD-10-CM | POA: Diagnosis not present

## 2020-08-13 DIAGNOSIS — Z0001 Encounter for general adult medical examination with abnormal findings: Secondary | ICD-10-CM | POA: Diagnosis not present

## 2020-08-13 DIAGNOSIS — R150 Incomplete defecation: Secondary | ICD-10-CM | POA: Diagnosis not present

## 2020-08-13 DIAGNOSIS — E785 Hyperlipidemia, unspecified: Secondary | ICD-10-CM | POA: Diagnosis not present

## 2020-08-13 DIAGNOSIS — I10 Essential (primary) hypertension: Secondary | ICD-10-CM | POA: Diagnosis not present

## 2020-08-13 DIAGNOSIS — K219 Gastro-esophageal reflux disease without esophagitis: Secondary | ICD-10-CM | POA: Diagnosis not present

## 2020-08-13 DIAGNOSIS — C50411 Malignant neoplasm of upper-outer quadrant of right female breast: Secondary | ICD-10-CM | POA: Diagnosis not present

## 2020-08-13 DIAGNOSIS — G4733 Obstructive sleep apnea (adult) (pediatric): Secondary | ICD-10-CM | POA: Diagnosis not present

## 2020-09-10 DIAGNOSIS — I10 Essential (primary) hypertension: Secondary | ICD-10-CM | POA: Diagnosis not present

## 2020-09-10 DIAGNOSIS — R150 Incomplete defecation: Secondary | ICD-10-CM | POA: Diagnosis not present

## 2020-09-10 DIAGNOSIS — E785 Hyperlipidemia, unspecified: Secondary | ICD-10-CM | POA: Diagnosis not present

## 2020-09-10 DIAGNOSIS — C50411 Malignant neoplasm of upper-outer quadrant of right female breast: Secondary | ICD-10-CM | POA: Diagnosis not present

## 2020-09-10 DIAGNOSIS — K219 Gastro-esophageal reflux disease without esophagitis: Secondary | ICD-10-CM | POA: Diagnosis not present

## 2020-09-10 DIAGNOSIS — N183 Chronic kidney disease, stage 3 unspecified: Secondary | ICD-10-CM | POA: Diagnosis not present

## 2020-09-10 DIAGNOSIS — G4733 Obstructive sleep apnea (adult) (pediatric): Secondary | ICD-10-CM | POA: Diagnosis not present

## 2020-09-14 DIAGNOSIS — G4733 Obstructive sleep apnea (adult) (pediatric): Secondary | ICD-10-CM | POA: Diagnosis not present

## 2020-09-14 DIAGNOSIS — K219 Gastro-esophageal reflux disease without esophagitis: Secondary | ICD-10-CM | POA: Diagnosis not present

## 2020-09-14 DIAGNOSIS — I130 Hypertensive heart and chronic kidney disease with heart failure and stage 1 through stage 4 chronic kidney disease, or unspecified chronic kidney disease: Secondary | ICD-10-CM | POA: Diagnosis not present

## 2020-09-14 DIAGNOSIS — E669 Obesity, unspecified: Secondary | ICD-10-CM | POA: Diagnosis not present

## 2020-09-14 DIAGNOSIS — G629 Polyneuropathy, unspecified: Secondary | ICD-10-CM | POA: Diagnosis not present

## 2020-09-14 DIAGNOSIS — E785 Hyperlipidemia, unspecified: Secondary | ICD-10-CM | POA: Diagnosis not present

## 2020-09-14 DIAGNOSIS — I509 Heart failure, unspecified: Secondary | ICD-10-CM | POA: Diagnosis not present

## 2020-09-14 DIAGNOSIS — E261 Secondary hyperaldosteronism: Secondary | ICD-10-CM | POA: Diagnosis not present

## 2020-09-14 DIAGNOSIS — K5900 Constipation, unspecified: Secondary | ICD-10-CM | POA: Diagnosis not present

## 2020-10-18 ENCOUNTER — Ambulatory Visit: Payer: Medicare PPO | Admitting: Oncology

## 2020-10-18 ENCOUNTER — Inpatient Hospital Stay: Payer: Medicare PPO

## 2020-10-24 ENCOUNTER — Inpatient Hospital Stay (HOSPITAL_BASED_OUTPATIENT_CLINIC_OR_DEPARTMENT_OTHER): Payer: Medicare PPO | Admitting: Oncology

## 2020-10-24 ENCOUNTER — Inpatient Hospital Stay: Payer: Medicare PPO | Attending: Oncology

## 2020-10-24 DIAGNOSIS — Z171 Estrogen receptor negative status [ER-]: Secondary | ICD-10-CM

## 2020-10-24 DIAGNOSIS — C50411 Malignant neoplasm of upper-outer quadrant of right female breast: Secondary | ICD-10-CM

## 2020-10-24 NOTE — Progress Notes (Signed)
[place]  Telephone:([phone]) [phone] Fax:(336) [phone]     ID: Rachel Kelly DOB: [birth year]  MR#: [id number]  EXH#:[id number]  Patient Care Team: Rachel Kelly, Rachel Kelly as PCP - General (Family Medicine) Rachel Kelly, Rachel Kelly as Referring Physician (Pulmonary Disease) Rachel Kelly, Rachel Kelly (Inactive) as Consulting Physician (General Surgery) Rachel Kelly, Rachel Kelly, Rachel Kelly as Consulting Physician (Oncology) Rachel Kelly, Rachel Kelly as Attending Physician (Radiation Oncology) Rachel Kelly, DDS as Consulting Physician (Dentistry) Rachel Kelly, Rachel Kelly as Consulting Physician (Dermatology) Rachel Kelly, Rachel Kelly, Rachel Kelly as Consulting Physician (Cardiology) Rachel Kaufmann, RN as Oncology Nurse Navigator Rachel Germany, RN as Oncology Nurse Navigator Rachel Kelly, Rachel Kelly (Orthopedic Surgery) Rachel Kelly, Rachel Kelly as Consulting Physician (Urology) OTHER Rachel Kelly:  CHIEF COMPLAINT: HER-2 positive, estrogen receptor negative breast cancer  CURRENT TREATMENT: observation   INTERVAL HISTORY: Rachel Kelly had car trouble today and called [place] to let us know she would be rescheduling Rachel visit  Since her last visit, she underwent bilateral diagnostic mammography with tomography at Rachel Kelly on 06/19/2020 Kelly: breast density category B; no evidence of malignancy in either breast.   Also since her last visit, she presented to urgent care on 12/30/2019 with abdominal pain, nausea, vomiting, and diarrhea. She was given IV fluids, IV Zofran, and Zofran ODT at discharge.   She then presented to Rachel ED on 01/01/2020 with continued symptoms, as well as new onset mild sternal chest discomfort. She underwent chest x-ray at that time Kelly: chronic bronchitic change without superimposed acute cardiopulmonary disease; redemonstrated calcified mediastinal and hilar lymph nodes, likely sequela of previous granulomatous infection.  She also underwent CT abdomen/pelvis that day Kelly: apparent circumferential wall thickening  involving majority of colon, no evidence of enteric obstruction; bilateral enhancing lesions (right renal lesion measuring 1.2 cm, left-sided renal lesions measuring 1.7 and 1.3 cm), similar to dedicated renal CT 07/2019, no evidence of renal vein involvement or additional metastasis; additional smaller (subcentimeter) though potentially enhancing bilateral renal lesions, too small to accurately characterize.  She was admitted and ultimately underwent EGD on 01/03/2020 under Rachel Kelly. Pathology from Rachel procedure [phone]) showed: no malignancy within stomach or esophagus.   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 She underwent bilateral diagnostic mammography with tomography and right breast ultrasonography at Rachel Kelly: breast density category C, suspicious mass at Rachel 10 o'clock position 8 cm from Rachel nipple. It measures 10 x 7 x 6 mm. Just adjacent to this mass, there is an irregular hypoechoic mass with punctate echogenic foci. It measures 9 x 8 x 3 mm. Additional indeterminate mass at Rachel 9 o'clock position 6 cm from Rachel nipple. It measures 6 x 4 x 4 mm. No suspicious right axillary lymphadenopathy.  Accordingly on 06/02/2018 she proceeded to biopsy of two of Rachel right breast areas in question. Rachel pathology from this procedure showed [phone] at Rachel 10 o'clock position, invasive ductal carcinoma, grade 2, estrogen and progesterone receptor negative, HER-2 equivocal by immunohistochemistry at 2+, but negative by Rachel Kelly with a signals ratio of 1.47 and number per cell 2.20.  Biopsy of Rachel right breast lesion at Rachel 9 o'clock position showed a sclerotic lesion with calcifications and negative for carcinoma.  Rachel  patient's subsequent history is as detailed below.  PAST Rachel HISTORY: Past Rachel History:  Diagnosis Date  . Anemia   . Blood transfusion 2005  . Family history of breast cancer   . Family history of prostate cancer   . Hernia   . HH (hiatus hernia)   . Hyperlipidemia   . Hypertension   . PAH (pulmonary artery hypertension) ([place])   . Personal history of chemotherapy   . Personal history of radiation therapy   . Sarcoidosis   . Shortness of breath    on exertion  . Sleep apnea    does not use cpap  . Wheezing     PAST SURGICAL HISTORY: Past Surgical History:  Procedure Laterality Date  . ABDOMINAL HYSTERECTOMY  1988  . BACK SURGERY  2005   lower  . BIOPSY  01/03/2020   Procedure: BIOPSY;  Surgeon: Rachel Kelly;  Location: [facility];  Service: Gastroenterology;;  . BREAST BIOPSY Right 06/02/2018  . BREAST LUMPECTOMY Right 07/02/2018  . BREAST LUMPECTOMY WITH RADIOACTIVE SEED AND SENTINEL LYMPH NODE BIOPSY Right 07/02/2018   Procedure: RIGHT BREAST RADIOACTIVE SEED LUMPECTOMY X2 AND RIGHT AXILLARY  SENTINEL LYMPH NODE BIOPSY;  Surgeon: Rachel Kelly, Rachel Kelly;  Location: Rachel Kelly;  Service: General;  Laterality: Right;  . ESOPHAGOGASTRODUODENOSCOPY  06/24/2012   Procedure: ESOPHAGOGASTRODUODENOSCOPY (EGD);  Surgeon: Rachel Kelly, Rachel Kelly;  Location: [facility];  Service: General;  Laterality: N/A;  . ESOPHAGOGASTRODUODENOSCOPY N/A 01/03/2020   Procedure: ESOPHAGOGASTRODUODENOSCOPY (EGD);  Surgeon: Rachel Kelly;  Location: Rachel [facility];  Service: Gastroenterology;  Laterality: N/A;  . HIATAL HERNIA REPAIR  08/18/2012   Procedure: LAPAROSCOPIC REPAIR OF HIATAL HERNIA;  Surgeon: Rachel Kelly, Rachel Kelly;  Location: Rachel Kelly;  Service: General;  Laterality: N/A;  Laparoscopic Hiatal Hernia Repair   . INSERTION OF MESH  08/18/2012   Procedure: INSERTION OF MESH;  Surgeon: Rachel Kelly, Rachel Kelly;  Location: Rachel Kelly;  Service: General;;  . IR IMAGING GUIDED PORT INSERTION   07/19/2018  . POLYPECTOMY  01/03/2020   Procedure: POLYPECTOMY;  Surgeon: Rachel Kelly;  Location: [facility];  Service: Gastroenterology;;  . PORT-A-CATH REMOVAL Right 08/17/2019   Procedure: REMOVAL PORT-A-CATH;  Surgeon: Jovita Kussmaul, Rachel Kelly;  Location: [facility];  Service: General;  Laterality: Right;  . PORTACATH PLACEMENT N/A 07/02/2018   Procedure: INSERTION PORT-A-CATH TRIED NO PORT IN PLACE;  Surgeon: Rachel Kelly, Rachel Kelly;  Location: [facility];  Service: General;  Laterality: N/A;  . VESICOVAGINAL FISTULA CLOSURE W/ TAH      FAMILY HISTORY Family History  Problem Relation Age of Onset  . Clotting disorder Mother        PE  . Heart disease Father   . Breast cancer Maternal Grandmother        spread to brain  . Coronary artery disease Other   . Prostate cancer Brother 43  . Breast cancer Maternal Aunt   . Cancer Maternal Aunt        unk type  . Prostate cancer Paternal Grandfather   . Prostate cancer Brother   She notes that her father died from heart disease at age 33. Patients' mother lives in a nursing home and is 43 (as of November 2019)-- she has a form of dementia. Rachel patient has 3 brothers and 0 sisters. A maternal aunt had a form of cancer, not clear what it was.  Rachel maternal grandmother had breast cancer, but was never treated because of her old age.  Rachel patient does not think anyone in her family  had ovarian cancer, but there are 2 others in her family that had colon cancer.    GYNECOLOGIC HISTORY:  No LMP recorded. Patient has had a hysterectomy. 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  She lives alone. She does not have any pets.  Her daughter Rachel Kelly, 48, lives in [place]  and works in Psychologist, educational. No grandchildren.  Rachel patient belongs to [place]: In place, but she can't remember who her power of attorney is (believes it is her daughter).   HEALTH MAINTENANCE: Social History   Tobacco Use  . Smoking status: Never Smoker  . Smokeless tobacco: Never Used  Vaping Use  . Vaping Use: Never used  Substance Use Topics  . Alcohol use: No  . Drug use: No    Colonoscopy: Dr. Collene Mares  PAP: yes, but not sure when  Bone density: No   Allergies  Allergen Reactions  . Shellfish Allergy Other (See Comments)    Pain all over  . Enbucrilate Itching and Rash    steri strips  . Penicillins Rash    Fine red bumps that spread all over Rachel body. Has patient had a PCN reaction causing immediate rash, facial/tongue/throat swelling, SOB or lightheadedness with hypotension: No Has patient had a PCN reaction causing severe rash involving mucus membranes or skin necrosis: No Has patient had a PCN reaction that required hospitalization: No Has patient had a PCN reaction occurring within Rachel last 10 years: No If all of Rachel above answers are "NO", then may proceed with Cephalosporin use.   . Skin Adhesives [Cyanoacrylate] Itching and Rash    steri strips    Current Outpatient Medications  Medication Sig Dispense Refill  . aspirin-sod bicarb-citric acid (ALKA-Kelly) 325 MG TBEF tablet Take 325 mg by mouth every 6 (six) hours as needed (cold symptoms).     . Coenzyme Q10 200 MG TABS Take 200 mg by mouth daily.    Marland Kitchen gabapentin (NEURONTIN) 100 MG capsule Take 1 capsule (100 mg total) by mouth at bedtime. 90 capsule 4  . hydrocortisone 2.5 % cream Apply 1 application topically 2 (two) times daily as needed (dry skin).    Marland Kitchen ketotifen (ALAWAY) 0.025 % ophthalmic solution Place 1 drop into both eyes 2 (two) times daily as needed (itchy, dry eyes).     Marland Kitchen loperamide (IMODIUM A-D) 2 MG capsule Take 2 mg by mouth daily as needed for diarrhea or  loose stools.    . methocarbamol (ROBAXIN) 500 MG tablet Take 500 mg by mouth 3 (three) times daily as needed for pain.    Marland Kitchen ondansetron (ZOFRAN ODT) 4 MG disintegrating tablet Take 1 tablet (4 mg total) by mouth every 8 (eight) hours as needed for nausea or vomiting. 20 tablet 0  . pantoprazole (PROTONIX) 40 MG tablet Take 1 tablet (40 mg total) by mouth 2 (two) times daily before a meal for 60 days, THEN 1 tablet (40 mg total) daily. 180 tablet 2  . Polyvinyl Alcohol-Povidone PF (REFRESH) 1.4-0.6 % SOLN Place 1 drop into both eyes daily as needed (dry eyes).     . potassium gluconate 595 MG TABS Take 595 mg by mouth daily.     Marland Kitchen  simvastatin (ZOCOR) 20 MG tablet Take 20 mg by mouth every morning.     . sucralfate (CARAFATE) 1 GM/10ML suspension Take 10 mLs (1 g total) by mouth 4 (four) times daily -  with meals and at bedtime for 11 days. 420 mL 0  . triamterene-hydrochlorothiazide (MAXZIDE-25) 37.5-25 MG per tablet Take 1 tablet by mouth every morning.      No current facility-administered medications for this visit.    OBJECTIVE:   There were no vitals filed for this visit. Wt Readings from Last 3 Encounters:  03/12/20 156 lb 6.4 oz (70.9 kg)  01/03/20 157 lb 10.1 oz (71.5 kg)  12/30/19 154 lb (69.9 kg)     LAB RESULTS:  CMP     Component Value Date/Time   NA 138 01/05/2020 0150   K 3.2 (L) 01/05/2020 0150   CL 104 01/05/2020 0150   CO2 27 01/05/2020 0150   GLUCOSE 103 (H) 01/05/2020 0150   BUN 10 01/05/2020 0150   CREATININE 1.25 (H) 01/05/2020 0150   CREATININE 1.18 (H) 01/17/2019 1305   CALCIUM 8.1 (L) 01/05/2020 0150   PROT 6.6 01/02/2020 0444   ALBUMIN 3.2 (L) 01/02/2020 0444   AST 16 01/02/2020 0444   AST 24 01/17/2019 1305   ALT 12 01/02/2020 0444   ALT 17 01/17/2019 1305   ALKPHOS 59 01/02/2020 0444   BILITOT 1.0 01/02/2020 0444   BILITOT 0.3 01/17/2019 1305   GFRNONAA 42 (L) 01/05/2020 0150   GFRNONAA 45 (L) 01/17/2019 1305   GFRAA 49 (L) 01/05/2020 0150    GFRAA 53 (L) 01/17/2019 1305    No results found for: TOTALPROTELP, ALBUMINELP, A1GS, A2GS, BETS, BETA2SER, GAMS, MSPIKE, SPEI  No results found for: KPAFRELGTCHN, LAMBDASER, KAPLAMBRATIO  Lab Results  Component Value Date   WBC 6.4 01/05/2020   NEUTROABS 6.3 01/02/2020   HGB 10.6 (L) 01/05/2020   HCT 32.1 (L) 01/05/2020   MCV 91.7 01/05/2020   PLT 134 (L) 01/05/2020    No results found for: LABCA2  No components found for: IRJJOA416  No results for input(s): INR in Rachel last 168 hours.  No results found for: LABCA2  No results found for: SAY301  No results found for: SWF093  No results found for: ATF573  No results found for: CA2729  No components found for: HGQUANT  No results found for: CEA1 / No results found for: CEA1   No results found for: AFPTUMOR  No results found for: CHROMOGRNA  No results found for: HGBA, HGBA2QUANT, HGBFQUANT, HGBSQUAN (Hemoglobinopathy evaluation)   No results found for: LDH  No results found for: IRON, TIBC, IRONPCTSAT (Iron and TIBC)  No results found for: FERRITIN  Urinalysis    Component Value Date/Time   COLORURINE STRAW (A) 07/27/2018 1050   APPEARANCEUR CLEAR 07/27/2018 1050   LABSPEC 1.020 08/05/2019 1155   PHURINE 6.0 08/05/2019 1155   GLUCOSEU NEGATIVE 08/05/2019 1155   HGBUR MODERATE (A) 08/05/2019 [place] 08/05/2019 1155   KETONESUR NEGATIVE 08/05/2019 1155   PROTEINUR 30 (A) 08/05/2019 1155   UROBILINOGEN 0.2 08/05/2019 1155   NITRITE POSITIVE (A) 08/05/2019 1155   LEUKOCYTESUR LARGE (A) 08/05/2019 1155    STUDIES:  No results found.   ELIGIBLE FOR AVAILABLE RESEARCH PROTOCOL: PREVENT  ASSESSMENT: 75 y.o. Rachel Kelly woman status post right breast upper outer quadrant biopsy 06/02/2018 for a clinical T1b N0, stage IB invasive ductal carcinoma, grade 2, triple negative, with an MIB-1 of 10%  (1) status post right lumpectomy  and sentinel lymph node sampling 07/02/2018 for a pT1b  pN0,stage IA invasive ductal carcinoma, grade 2, now HER-2 positive, still estrogen and progesterone receptor negative, with an MIB-1 of 1%  (2) adjuvant chemotherapy consisting of paclitaxel x12 with trastuzumab given weekly started 07/27/2018  (a) paclitaxel discontinued after 3 doses because of neuropathy-- last dose 08/09/2018  (3) trastuzumab continued to total 1 year (last dose 07/04/2019)  (a) echocardiogram 07/22/2018 showed an ejection fraction in Rachel 55-60% range.  (b) Echocardiogram on 12/14/2018 shows EF of 55-60%  (c) echo 03/03/2019 shows ejection fraction in Rachel 50-55% range  (d) echo 06/08/2019 shows an ejection fraction in Rachel 50-55% range  (3) adjuvant radiation 09/20/2018 - 10/15/2018  (a) right breast / 40.05 Gy in 15 fractions  (b) boost / 10 Gy in 5 fractions  (4) genetics testing 06/01/2019 through Rachel [facility] Common Hereditary Cancers Panel found no deleterious mutations in APC, ATM, AXIN2, BARD1, BMPR1A, BRCA1, BRCA2, BRIP1, CDH1, CDKN2A (p14ARF), CDKN2A (p16INK4a), CKD4, CHEK2, CTNNA1, DICER1, EPCAM (Deletion/duplication testing only), GREM1 (promoter region deletion/duplication testing only), KIT, MEN1, MLH1, MSH2, MSH3, MSH6, MUTYH, NBN, NF1, NHTL1, PALB2, PDGFRA, PMS2, POLD1, POLE, PTEN, RAD50, RAD51C, RAD51D, RNF43, SDHB, SDHC, SDHD, SMAD4, SMARCA4. STK11, TP53, TSC1, TSC2, and VHL.  Rachel following genes were evaluated for sequence changes only: SDHA and HOXB13 c.251G>A variant only. Rachel report date is 06/01/2019.   PLAN: Rachel Kelly had car trouble today and was unable to make Rachel visit.  She told us she would call to reschedule.  Rachel Kelly, Rachel Kelly, Rachel Kelly  10/24/20 5:48 PM [facility] [place], [place] [address] Tel. [phone]    Fax. [phone]   I, Wilburn Mylar, am acting as scribe for Dr. Virgie Kelly. Thaison Kolodziejski.  I, Lurline Del Rachel Kelly, have reviewed Rachel above documentation for accuracy and  completeness, and I agree with Rachel above.   *Total Encounter Time as defined by Rachel [facility] for Medicare and Medicaid Services includes, in addition to Rachel face-to-face time of a patient visit (documented in Rachel note above) non-face-to-face time: obtaining and reviewing outside history, ordering and reviewing medications, tests or procedures, care coordination (communications with other health care professionals or caregivers) and documentation in Rachel Rachel record.

## 2020-10-25 ENCOUNTER — Ambulatory Visit: Payer: Medicare PPO | Admitting: Oncology

## 2020-10-25 ENCOUNTER — Telehealth: Payer: Self-pay | Admitting: Oncology

## 2020-10-25 ENCOUNTER — Other Ambulatory Visit: Payer: Medicare PPO

## 2020-10-25 NOTE — Telephone Encounter (Signed)
Scheduled per 4/7 staff msg. Called and spoke with pt, confirmed 5/16 appts

## 2020-11-05 DIAGNOSIS — N8110 Cystocele, unspecified: Secondary | ICD-10-CM | POA: Diagnosis not present

## 2020-11-05 DIAGNOSIS — N819 Female genital prolapse, unspecified: Secondary | ICD-10-CM | POA: Diagnosis not present

## 2020-11-05 DIAGNOSIS — Z4689 Encounter for fitting and adjustment of other specified devices: Secondary | ICD-10-CM | POA: Diagnosis not present

## 2020-11-21 DIAGNOSIS — M48062 Spinal stenosis, lumbar region with neurogenic claudication: Secondary | ICD-10-CM | POA: Diagnosis not present

## 2020-11-21 DIAGNOSIS — M4326 Fusion of spine, lumbar region: Secondary | ICD-10-CM | POA: Diagnosis not present

## 2020-11-21 DIAGNOSIS — Z6831 Body mass index (BMI) 31.0-31.9, adult: Secondary | ICD-10-CM | POA: Diagnosis not present

## 2020-11-28 ENCOUNTER — Other Ambulatory Visit: Payer: Medicare PPO

## 2020-11-28 ENCOUNTER — Ambulatory Visit: Payer: Medicare PPO | Admitting: Oncology

## 2020-12-02 NOTE — Progress Notes (Signed)
[place]  [phone] Fax:([phone]) [phone]     ID: Rachel Kelly DOB: July 27, 1945  MR#: [id number]  XNT#:[id number]  Patient Care Team: [name], MD as PCP - General (Family Medicine) Deneise Lever, MD as Referring Physician (Pulmonary Disease) [name], MD (Inactive) as Consulting Physician (General Surgery) Aamina Skiff, [name], MD as Consulting Physician (Oncology) Eppie Gibson, MD as Attending Physician (Radiation Oncology) Chelsea Aus, DDS as Consulting Physician (Dentistry) Harriett Sine, MD as Consulting Physician (Dermatology) Bensimhon, Shaune Pascal, MD as Consulting Physician (Cardiology) Mauro Kaufmann, RN as Oncology Nurse Navigator Rockwell Germany, RN as Oncology Nurse Navigator Starling Manns, MD (Orthopedic Surgery) Alexis Frock, MD as Consulting Physician (Urology) OTHER MD:  CHIEF COMPLAINT: HER-2 positive, estrogen receptor negative breast cancer  CURRENT TREATMENT: observation   INTERVAL HISTORY: Rachel Kelly returns today for follow up of her Her2 positive breast cancer.  Since her last visit, she underwent bilateral diagnostic mammography with tomography at [facility] on 06/19/2020 showing: breast density category B; no evidence of malignancy in either breast.   Also since her last visit, she presented to urgent care on 12/30/2019 with abdominal pain, nausea, vomiting, and diarrhea. She was given IV fluids, IV Zofran, and Zofran ODT at discharge.   She then presented to the ED on 01/01/2020 with continued symptoms, as well as new onset mild sternal chest discomfort. She underwent chest x-ray at that time showing: chronic bronchitic change without superimposed acute cardiopulmonary disease; redemonstrated calcified mediastinal and hilar lymph nodes, likely sequela of previous granulomatous infection.  She also underwent CT abdomen/pelvis that day showing: apparent circumferential wall thickening involving majority of  colon, no evidence of enteric obstruction; bilateral enhancing lesions (right renal lesion measuring 1.2 cm, left-sided renal lesions measuring 1.7 and 1.3 cm), similar to dedicated renal CT 07/2019, no evidence of renal vein involvement or additional metastasis; additional smaller (subcentimeter) though potentially enhancing bilateral renal lesions, too small to accurately characterize.  The renal lesions were essentially unchanged as compared to January 2020 but remain worrisome for renal cell carcinomas.  This is followed by Dr. Bess Harvest who obtained a CT scan January 2022 of these lesions showing essentially no change.  She was admitted and ultimately underwent EGD on 01/03/2020 under Dr. Therisa Doyne. Pathology from the procedure [phone]) showed: no malignancy or metaplasia within stomach or esophagus. H Pylori was negative.There was ulceration in the stomach and esophagus with reactive changes  REVIEW OF SYSTEMS: Rachel Kelly is generally doing very well.  She says her sarcoidosis has not been active for at least 10 years.  Nevertheless she did say that last year was not a good year for her.  She had surgery, she was in the hospital a few days, and she had significant stomach problems as noted above.  She is doing much better continuing on Protonix.  She is no longer on Carafate.  She tries to walk 1 to 1-1/2 miles most days.  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  She is also currently taking care of her daughter who recently had back surgery   COVID 19 VACCINATION STATUS: fully vaccinated [employer]), with booster 04/2020   HISTORY OF CURRENT ILLNESS: From the original intake note:  Rachel Kelly had routine screening mammography on 05/26/2018 showing a possible abnormality in the right breast. She underwent bilateral diagnostic mammography with tomography and right breast ultrasonography at [facility] on 06/02/2018 showing: breast density category C, suspicious mass at the  10 o'clock  position 8 cm from the nipple. It measures 10 x 7 x 6 mm. Just adjacent to this mass, there is an irregular hypoechoic mass with punctate echogenic foci. It measures 9 x 8 x 3 mm. Additional indeterminate mass at the 9 o'clock position 6 cm from the nipple. It measures 6 x 4 x 4 mm. No suspicious right axillary lymphadenopathy.  Accordingly on 06/02/2018 she proceeded to biopsy of two of the right breast areas in question. The pathology from this procedure showed [id number]): at the 10 o'clock position, invasive ductal carcinoma, grade 2, estrogen and progesterone receptor negative, HER-2 equivocal by immunohistochemistry at 2+, but negative by [facility] with a signals ratio of 1.47 and number per cell 2.20.  Biopsy of the right breast lesion at the 9 o'clock position showed a sclerotic lesion with calcifications and negative for carcinoma.  The patient's subsequent history is as detailed below.   PAST MEDICAL HISTORY: Past Medical History:  Diagnosis Date  . Anemia   . Blood transfusion 2005  . Family history of breast cancer   . Family history of prostate cancer   . Hernia   . HH (hiatus hernia)   . Hyperlipidemia   . Hypertension   . PAH (pulmonary artery hypertension) ([place])   . Personal history of chemotherapy   . Personal history of radiation therapy   . Sarcoidosis   . Shortness of breath    on exertion  . Sleep apnea    does not use cpap  . Wheezing     PAST SURGICAL HISTORY: Past Surgical History:  Procedure Laterality Date  . ABDOMINAL HYSTERECTOMY  1988  . BACK SURGERY  2005   lower  . BIOPSY  01/03/2020   Procedure: BIOPSY;  Surgeon: Ronnette Juniper, MD;  Location: [facility] ENDOSCOPY;  Service: Gastroenterology;;  . BREAST BIOPSY Right 06/02/2018  . BREAST LUMPECTOMY Right 07/02/2018  . BREAST LUMPECTOMY WITH RADIOACTIVE SEED AND SENTINEL LYMPH NODE BIOPSY Right 07/02/2018   Procedure: RIGHT BREAST RADIOACTIVE SEED LUMPECTOMY X2 AND RIGHT AXILLARY  SENTINEL LYMPH NODE BIOPSY;   Surgeon: [name], MD;  Location: [place];  Service: General;  Laterality: Right;  . ESOPHAGOGASTRODUODENOSCOPY  06/24/2012   Procedure: ESOPHAGOGASTRODUODENOSCOPY (EGD);  Surgeon: [name], MD;  Location: [name] ENDOSCOPY;  Service: General;  Laterality: N/A;  . ESOPHAGOGASTRODUODENOSCOPY N/A 01/03/2020   Procedure: ESOPHAGOGASTRODUODENOSCOPY (EGD);  Surgeon: Ronnette Juniper, MD;  Location: [place];  Service: Gastroenterology;  Laterality: N/A;  . HIATAL HERNIA REPAIR  08/18/2012   Procedure: LAPAROSCOPIC REPAIR OF HIATAL HERNIA;  Surgeon: [name], MD;  Location: [facility];  Service: General;  Laterality: N/A;  Laparoscopic Hiatal Hernia Repair   . INSERTION OF MESH  08/18/2012   Procedure: INSERTION OF MESH;  Surgeon: [name], MD;  Location: [facility];  Service: General;;  . IR IMAGING GUIDED PORT INSERTION  07/19/2018  . POLYPECTOMY  01/03/2020   Procedure: POLYPECTOMY;  Surgeon: Ronnette Juniper, MD;  Location: [place];  Service: Gastroenterology;;  . PORT-A-CATH REMOVAL Right 08/17/2019   Procedure: REMOVAL PORT-A-CATH;  Surgeon: Jovita Kussmaul, MD;  Location: [place];  Service: General;  Laterality: Right;  . PORTACATH PLACEMENT N/A 07/02/2018   Procedure: INSERTION PORT-A-CATH TRIED NO PORT IN PLACE;  Surgeon: [name], MD;  Location: [place];  Service: General;  Laterality: N/A;  . VESICOVAGINAL FISTULA CLOSURE W/ TAH      FAMILY HISTORY Family History  Problem Relation Age of Onset  . Clotting  disorder Mother        PE  . Heart disease Father   . Breast cancer Maternal Grandmother        spread to brain  . Coronary artery disease Other   . Prostate cancer Brother 4  . Breast cancer Maternal Aunt   . Cancer Maternal Aunt        unk type  . Prostate cancer Paternal Grandfather   . Prostate cancer Brother   She notes that her father died from heart disease at age 85. Patients' mother lives in a nursing home and is 61 (as of  November 2019)-- she has a form of dementia. The patient has 3 brothers and 0 sisters. A maternal aunt had a form of cancer, not clear what it was.  The maternal grandmother had breast cancer, but was never treated because of her old age.  The patient does not think anyone in her family had ovarian cancer, but there are 2 others in her family that had colon cancer.    GYNECOLOGIC HISTORY:  No LMP recorded. Patient has had a hysterectomy. 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  She lives alone. She does not have any pets.  Her daughter Deatra Canter "Cantrece" Grandville Silos, 48, lives in [place] and works in Psychologist, educational. No grandchildren.  The patient belongs to [place]: In place, but she can't remember who her power of attorney is (believes it is her daughter).   HEALTH MAINTENANCE: Social History   Tobacco Use  . Smoking status: Never Smoker  . Smokeless tobacco: Never Used  Vaping Use  . Vaping Use: Never used  Substance Use Topics  . Alcohol use: No  . Drug use: No    Colonoscopy: Dr. Collene Mares  PAP: yes, but not sure when  Bone density: No   Allergies  Allergen Reactions  . Shellfish Allergy Other (See Comments)    Pain all over  . Enbucrilate Itching and Rash    steri strips  . Penicillins Rash    Fine red bumps that spread all over the body. Has patient had a PCN reaction causing immediate rash, facial/tongue/throat swelling, SOB or lightheadedness with hypotension: No Has patient had a PCN reaction causing severe rash involving mucus membranes or skin necrosis: No Has patient had a PCN reaction that required hospitalization: No Has patient had a PCN reaction occurring within the last 10 years: No If all of the above answers are "NO",  then may proceed with Cephalosporin use.   . Skin Adhesives [Cyanoacrylate] Itching and Rash    steri strips    Current Outpatient Medications  Medication Sig Dispense Refill  . aspirin-sod bicarb-citric acid (ALKA-SELTZER) 325 MG TBEF tablet Take 325 mg by mouth every 6 (six) hours as needed (cold symptoms).     . Coenzyme Q10 200 MG TABS Take 200 mg by mouth daily.    Marland Kitchen gabapentin (NEURONTIN) 100 MG capsule Take 1 capsule (100 mg total) by mouth at bedtime. 90 capsule 4  . hydrocortisone 2.5 % cream Apply 1 application topically 2 (two) times daily as needed (dry skin).    Marland Kitchen ketotifen (ALAWAY) 0.025 % ophthalmic solution Place 1 drop into both eyes 2 (two) times daily as needed (itchy, dry eyes).     Marland Kitchen  loperamide (IMODIUM A-D) 2 MG capsule Take 2 mg by mouth daily as needed for diarrhea or loose stools.    . methocarbamol (ROBAXIN) 500 MG tablet Take 500 mg by mouth 3 (three) times daily as needed for pain.    Marland Kitchen ondansetron (ZOFRAN ODT) 4 MG disintegrating tablet Take 1 tablet (4 mg total) by mouth every 8 (eight) hours as needed for nausea or vomiting. 20 tablet 0  . pantoprazole (PROTONIX) 40 MG tablet Take 1 tablet (40 mg total) by mouth 2 (two) times daily before a meal for 60 days, THEN 1 tablet (40 mg total) daily. 180 tablet 2  . Polyvinyl Alcohol-Povidone PF (REFRESH) 1.4-0.6 % SOLN Place 1 drop into both eyes daily as needed (dry eyes).     . potassium gluconate 595 MG TABS Take 595 mg by mouth daily.     . simvastatin (ZOCOR) 20 MG tablet Take 20 mg by mouth every morning.     . sucralfate (CARAFATE) 1 GM/10ML suspension Take 10 mLs (1 g total) by mouth 4 (four) times daily -  with meals and at bedtime for 11 days. 420 mL 0  . triamterene-hydrochlorothiazide (MAXZIDE-25) 37.5-25 MG per tablet Take 1 tablet by mouth every morning.      No current facility-administered medications for this visit.    OBJECTIVE: African-American woman who looks well  Vitals:   12/03/20 1123  BP:  119/67  Pulse: 73  Resp: 18  Temp: 97.7 F (36.5 C)  SpO2: 100%   Wt Readings from Last 3 Encounters:  12/03/20 160 lb 1.6 oz (72.6 kg)  03/12/20 156 lb 6.4 oz (70.9 kg)  01/03/20 157 lb 10.1 oz (71.5 kg)   Sclerae unicteric, EOMs intact Wearing a mask No cervical or supraclavicular adenopathy Lungs no rales or rhonchi Heart regular rate and rhythm Abd soft, nontender, positive bowel sounds MSK no focal spinal tenderness, no upper extremity lymphedema Neuro: nonfocal, well oriented, appropriate affect Breasts: The right breast is status postlumpectomy and radiation.  There is no evidence of local recurrence.  The left breast and both axillae are benign.   LAB RESULTS:  CMP     Component Value Date/Time   NA 138 01/05/2020 0150   K 3.2 (L) 01/05/2020 0150   CL 104 01/05/2020 0150   CO2 27 01/05/2020 0150   GLUCOSE 103 (H) 01/05/2020 0150   BUN 10 01/05/2020 0150   CREATININE 1.25 (H) 01/05/2020 0150   CREATININE 1.18 (H) 01/17/2019 1305   CALCIUM 8.1 (L) 01/05/2020 0150   PROT 6.6 01/02/2020 0444   ALBUMIN 3.2 (L) 01/02/2020 0444   AST 16 01/02/2020 0444   AST 24 01/17/2019 1305   ALT 12 01/02/2020 0444   ALT 17 01/17/2019 1305   ALKPHOS 59 01/02/2020 0444   BILITOT 1.0 01/02/2020 0444   BILITOT 0.3 01/17/2019 1305   GFRNONAA 42 (L) 01/05/2020 0150   GFRNONAA 45 (L) 01/17/2019 1305   GFRAA 49 (L) 01/05/2020 0150   GFRAA 53 (L) 01/17/2019 1305    No results found for: TOTALPROTELP, ALBUMINELP, A1GS, A2GS, BETS, BETA2SER, GAMS, MSPIKE, SPEI  No results found for: KPAFRELGTCHN, LAMBDASER, KAPLAMBRATIO  Lab Results  Component Value Date   WBC 5.8 12/03/2020   NEUTROABS 3.4 12/03/2020   HGB 12.4 12/03/2020   HCT 37.7 12/03/2020   MCV 91.7 12/03/2020   PLT 190 12/03/2020    No results found for: LABCA2  No components found for: QASTMH962  No results for input(s): INR in the last 168  hours.  No results found for: LABCA2  No results found for:  BTD176  No results found for: HYW737  No results found for: TGG269  No results found for: CA2729  No components found for: HGQUANT  No results found for: CEA1 / No results found for: CEA1   No results found for: AFPTUMOR  No results found for: CHROMOGRNA  No results found for: HGBA, HGBA2QUANT, HGBFQUANT, HGBSQUAN (Hemoglobinopathy evaluation)   No results found for: LDH  No results found for: IRON, TIBC, IRONPCTSAT (Iron and TIBC)  No results found for: FERRITIN  Urinalysis    Component Value Date/Time   COLORURINE STRAW (A) 07/27/2018 1050   APPEARANCEUR CLEAR 07/27/2018 1050   LABSPEC 1.020 08/05/2019 1155   PHURINE 6.0 08/05/2019 1155   GLUCOSEU NEGATIVE 08/05/2019 1155   HGBUR MODERATE (A) 08/05/2019 [place] 08/05/2019 1155   KETONESUR NEGATIVE 08/05/2019 1155   PROTEINUR 30 (A) 08/05/2019 1155   UROBILINOGEN 0.2 08/05/2019 1155   NITRITE POSITIVE (A) 08/05/2019 1155   LEUKOCYTESUR LARGE (A) 08/05/2019 1155    STUDIES:  No results found.   ELIGIBLE FOR AVAILABLE RESEARCH PROTOCOL: PREVENT  ASSESSMENT: 75 y.o. [place] woman status post right breast upper outer quadrant biopsy 06/02/2018 for a clinical T1b N0, stage IB invasive ductal carcinoma, grade 2, triple negative, with an MIB-1 of 10%  (1) status post right lumpectomy and sentinel lymph node sampling 07/02/2018 for a pT1b pN0,stage IA invasive ductal carcinoma, grade 2, now HER-2 positive, still estrogen and progesterone receptor negative, with an MIB-1 of 1%  (2) adjuvant chemotherapy consisting of paclitaxel x12 with trastuzumab given weekly started 07/27/2018  (a) paclitaxel discontinued after 3 doses because of neuropathy-- last dose 08/09/2018  (3) trastuzumab continued to total 1 year (last dose 07/04/2019)  (a) echocardiogram 07/22/2018 showed an ejection fraction in the 55-60% range.  (b) Echocardiogram on 12/14/2018 shows EF of 55-60%  (c) echo 03/03/2019 shows  ejection fraction in the 50-55% range  (d) echo 06/08/2019 shows an ejection fraction in the 50-55% range  (3) adjuvant radiation 09/20/2018 - 10/15/2018  (a) right breast / 40.05 Gy in 15 fractions  (b) boost / 10 Gy in 5 fractions  (4) genetics testing 06/01/2019 through the [facility] Common Hereditary Cancers Panel found no deleterious mutations in APC, ATM, AXIN2, BARD1, BMPR1A, BRCA1, BRCA2, BRIP1, CDH1, CDKN2A (p14ARF), CDKN2A (p16INK4a), CKD4, CHEK2, CTNNA1, DICER1, EPCAM (Deletion/duplication testing only), GREM1 (promoter region deletion/duplication testing only), KIT, MEN1, MLH1, MSH2, MSH3, MSH6, MUTYH, NBN, NF1, NHTL1, PALB2, PDGFRA, PMS2, POLD1, POLE, PTEN, RAD50, RAD51C, RAD51D, RNF43, SDHB, SDHC, SDHD, SMAD4, SMARCA4. STK11, TP53, TSC1, TSC2, and VHL.  The following genes were evaluated for sequence changes only: SDHA and HOXB13 c.251G>A variant only. The report date is 06/01/2019.  (5) bilateral renal masses followed by [name], with yearly scans (JAN 2022 most recently)   PLAN: [place] is now 2-1/2 years out from definitive surgery for her breast cancer with no evidence of disease recurrence.  This is very favorable particularly as estrogen receptor negative tumors if they recur tend to recur early.  I commended her exercise program and suggested she extended to 2 miles most days.  Otherwise she will return to see [place] in 1 year.  The plan is to continue follow-up yearly until she completes her 5 years from surgery  Total encounter time 20 minutes.*  Rachel Kelly, [name], MD  12/03/20 11:30 AM Medical Oncology and Hematology [facility] [place],  [place] [address] Tel. [phone]    Fax. [phone]   I, Wilburn Mylar, am acting as scribe for Dr. [name]. Rachel Kelly.  I, Lurline Del MD, have reviewed the above documentation for accuracy and completeness, and I agree with the above.   *Total Encounter Time as defined by the Centers for  Medicare and Medicaid Services includes, in addition to the face-to-face time of a patient visit (documented in the note above) non-face-to-face time: obtaining and reviewing outside history, ordering and reviewing medications, tests or procedures, care coordination (communications with other health care professionals or caregivers) and documentation in the medical record.

## 2020-12-03 ENCOUNTER — Inpatient Hospital Stay: Payer: Medicare PPO | Attending: Oncology

## 2020-12-03 ENCOUNTER — Inpatient Hospital Stay: Payer: Medicare PPO | Admitting: Oncology

## 2020-12-03 ENCOUNTER — Other Ambulatory Visit: Payer: Self-pay

## 2020-12-03 VITALS — BP 119/67 | HR 73 | Temp 97.7°F | Resp 18 | Ht 59.0 in | Wt 160.1 lb

## 2020-12-03 DIAGNOSIS — C50411 Malignant neoplasm of upper-outer quadrant of right female breast: Secondary | ICD-10-CM

## 2020-12-03 DIAGNOSIS — Z9221 Personal history of antineoplastic chemotherapy: Secondary | ICD-10-CM | POA: Diagnosis not present

## 2020-12-03 DIAGNOSIS — E782 Mixed hyperlipidemia: Secondary | ICD-10-CM | POA: Diagnosis not present

## 2020-12-03 DIAGNOSIS — D869 Sarcoidosis, unspecified: Secondary | ICD-10-CM | POA: Diagnosis not present

## 2020-12-03 DIAGNOSIS — Z171 Estrogen receptor negative status [ER-]: Secondary | ICD-10-CM

## 2020-12-03 DIAGNOSIS — Z853 Personal history of malignant neoplasm of breast: Secondary | ICD-10-CM | POA: Diagnosis not present

## 2020-12-03 DIAGNOSIS — G4733 Obstructive sleep apnea (adult) (pediatric): Secondary | ICD-10-CM

## 2020-12-03 LAB — CBC WITH DIFFERENTIAL/PLATELET
Abs Immature Granulocytes: 0.02 10*3/uL (ref 0.00–0.07)
Basophils Absolute: 0 10*3/uL (ref 0.0–0.1)
Basophils Relative: 1 %
Eosinophils Absolute: 0.2 10*3/uL (ref 0.0–0.5)
Eosinophils Relative: 3 %
HCT: 37.7 % (ref 36.0–46.0)
Hemoglobin: 12.4 g/dL (ref 12.0–15.0)
Immature Granulocytes: 0 %
Lymphocytes Relative: 29 %
Lymphs Abs: 1.7 10*3/uL (ref 0.7–4.0)
MCH: 30.2 pg (ref 26.0–34.0)
MCHC: 32.9 g/dL (ref 30.0–36.0)
MCV: 91.7 fL (ref 80.0–100.0)
Monocytes Absolute: 0.5 10*3/uL (ref 0.1–1.0)
Monocytes Relative: 9 %
Neutro Abs: 3.4 10*3/uL (ref 1.7–7.7)
Neutrophils Relative %: 58 %
Platelets: 190 10*3/uL (ref 150–400)
RBC: 4.11 MIL/uL (ref 3.87–5.11)
RDW: 13.4 % (ref 11.5–15.5)
WBC: 5.8 10*3/uL (ref 4.0–10.5)
nRBC: 0 % (ref 0.0–0.2)

## 2020-12-03 LAB — COMPREHENSIVE METABOLIC PANEL
ALT: 21 U/L (ref 0–44)
AST: 31 U/L (ref 15–41)
Albumin: 3.6 g/dL (ref 3.5–5.0)
Alkaline Phosphatase: 71 U/L (ref 38–126)
Anion gap: 6 (ref 5–15)
BUN: 15 mg/dL (ref 8–23)
CO2: 29 mmol/L (ref 22–32)
Calcium: 9.2 mg/dL (ref 8.9–10.3)
Chloride: 106 mmol/L (ref 98–111)
Creatinine, Ser: 1.12 mg/dL — ABNORMAL HIGH (ref 0.44–1.00)
GFR, Estimated: 51 mL/min — ABNORMAL LOW (ref >60)
Glucose, Bld: 117 mg/dL — ABNORMAL HIGH (ref 70–99)
Potassium: 4 mmol/L (ref 3.5–5.1)
Sodium: 141 mmol/L (ref 135–145)
Total Bilirubin: 0.9 mg/dL (ref 0.3–1.2)
Total Protein: 7.1 g/dL (ref 6.5–8.1)

## 2020-12-10 ENCOUNTER — Telehealth: Payer: Self-pay | Admitting: Oncology

## 2020-12-10 NOTE — Telephone Encounter (Signed)
Scheduled appointment per 05/16 los. Patient is aware.  

## 2020-12-11 DIAGNOSIS — Z8616 Personal history of COVID-19: Secondary | ICD-10-CM | POA: Diagnosis not present

## 2020-12-15 DIAGNOSIS — Z20822 Contact with and (suspected) exposure to covid-19: Secondary | ICD-10-CM | POA: Diagnosis not present

## 2020-12-19 DIAGNOSIS — U071 COVID-19: Secondary | ICD-10-CM | POA: Diagnosis not present

## 2020-12-19 DIAGNOSIS — Z20822 Contact with and (suspected) exposure to covid-19: Secondary | ICD-10-CM | POA: Diagnosis not present

## 2021-01-07 DIAGNOSIS — R197 Diarrhea, unspecified: Secondary | ICD-10-CM | POA: Diagnosis not present

## 2021-01-07 DIAGNOSIS — N1831 Chronic kidney disease, stage 3a: Secondary | ICD-10-CM | POA: Diagnosis not present

## 2021-01-07 DIAGNOSIS — M1830 Unilateral post-traumatic osteoarthritis of first carpometacarpal joint, unspecified hand: Secondary | ICD-10-CM | POA: Diagnosis not present

## 2021-01-07 DIAGNOSIS — I129 Hypertensive chronic kidney disease with stage 1 through stage 4 chronic kidney disease, or unspecified chronic kidney disease: Secondary | ICD-10-CM | POA: Diagnosis not present

## 2021-01-07 DIAGNOSIS — R151 Fecal smearing: Secondary | ICD-10-CM | POA: Diagnosis not present

## 2021-01-23 DIAGNOSIS — H524 Presbyopia: Secondary | ICD-10-CM | POA: Diagnosis not present

## 2021-01-23 DIAGNOSIS — H04123 Dry eye syndrome of bilateral lacrimal glands: Secondary | ICD-10-CM | POA: Diagnosis not present

## 2021-01-23 DIAGNOSIS — H402231 Chronic angle-closure glaucoma, bilateral, mild stage: Secondary | ICD-10-CM | POA: Diagnosis not present

## 2021-02-08 ENCOUNTER — Other Ambulatory Visit: Payer: Self-pay | Admitting: *Deleted

## 2021-02-08 MED ORDER — GABAPENTIN 100 MG PO CAPS: 100.0000 mg | ORAL_CAPSULE | Freq: Every day | ORAL | 4 refills | Status: DC

## 2021-02-27 DIAGNOSIS — M4326 Fusion of spine, lumbar region: Secondary | ICD-10-CM | POA: Diagnosis not present

## 2021-02-27 DIAGNOSIS — I82401 Acute embolism and thrombosis of unspecified deep veins of right lower extremity: Secondary | ICD-10-CM | POA: Diagnosis not present

## 2021-02-27 DIAGNOSIS — Z6831 Body mass index (BMI) 31.0-31.9, adult: Secondary | ICD-10-CM | POA: Diagnosis not present

## 2021-03-04 DIAGNOSIS — N1831 Chronic kidney disease, stage 3a: Secondary | ICD-10-CM | POA: Diagnosis not present

## 2021-03-04 DIAGNOSIS — I129 Hypertensive chronic kidney disease with stage 1 through stage 4 chronic kidney disease, or unspecified chronic kidney disease: Secondary | ICD-10-CM | POA: Diagnosis not present

## 2021-03-04 DIAGNOSIS — R150 Incomplete defecation: Secondary | ICD-10-CM | POA: Diagnosis not present

## 2021-03-04 DIAGNOSIS — E785 Hyperlipidemia, unspecified: Secondary | ICD-10-CM | POA: Diagnosis not present

## 2021-03-04 DIAGNOSIS — N183 Chronic kidney disease, stage 3 unspecified: Secondary | ICD-10-CM | POA: Diagnosis not present

## 2021-03-07 ENCOUNTER — Other Ambulatory Visit: Payer: Self-pay | Admitting: Orthopaedic Surgery

## 2021-03-11 NOTE — Progress Notes (Deleted)
Patient ID: Rachel Kelly, female    DOB: [birth year], 75 y.o.   MRN: [id number] PCP Dr [name]  NPSG- 05/26/12- AHI 17.6/ hr. Moderate obstructive sleep apnea, body weight 176 pounds  =============================================================== 08/16/2015-75 year old female never smoker followed for sarcoid, complicated by dyspnea, repaired hiatal hernia/GERD risk FOLLOWS FOR:Pt states she has been sleeping well and breathing has been going good at this time. Reports GERD symptoms are well controlled on medication. Denies cough or dyspnea.  03/12/21- 75 yoF never smoker followed for  OSA, sarcoid, complicated by dyspnea, repaired hiatal hernia/GERD risk, Pulmonary Hypertenison, HTN, CKD3, RBreast Cancer, Hyperlipidemia,  She was intolerant of CPAP as of LOV 03/12/21 and it was left to her to decide whether to update with HST. Body weight today- Covid vax-    CXR 01/01/20- IMPRESSION: 1. Chronic bronchitic change without superimposed acute cardiopulmonary disease. 2. Redemonstrated calcified mediastinal and hilar lymph nodes, likely the sequela of previous granulomatous infection.  Review of Systems-see HPI Constitutional:   No-   weight loss, night sweats, fevers, chills, fatigue, lassitude. overweight HEENT:   No-  headaches, difficulty swallowing, tooth/dental problems, sore throat,       No-  sneezing, itching, ear ache, nasal congestion, post nasal drip,  CV:  No-   chest pain, orthopnea, PND, swelling in lower extremities, anasarca,  dizziness, palpitations Resp: +  shortness of breath with exertion or at rest.              No-   productive cough,  No-non-productive cough,  No-  coughing up of blood.              No-   change in color of mucus.  No- wheezing.   Skin: No-   rash or lesions. GI:  +heartburn, indigestion, abdominal pain, nausea, vomiting, ,   GU:  MS:  No-   joint pain or swelling.  Neuro- nothing unusual:  Psych:  No- change in mood or affect. No  depression or anxiety.  No memory loss.  Objective:   Physical Exam General- Alert, Oriented, Affect- friendly, Distress- none acute, overweight Skin- rash-none, lesions- none, excoriation- none Lymphadenopathy- none Head- atraumatic            Eyes- Gross vision intact, PERRLA, conjunctivae clear secretions            Ears- Hearing, canals-normal            Nose- Clear, no-Septal dev, mucus, polyps, erosion, perforation             Throat- Mallampati III , mucosa clear , drainage- none, tonsils- atrophic Neck- flexible , trachea midline, no stridor , thyroid nl, carotid no bruit Chest - symmetrical excursion , unlabored           Heart/CV- RRR , no murmur , no gallop  , no rub, nl s1 s2                           - JVD-none , edema- none, stasis changes- none, varices- none           Lung- clear to P&A, wheeze- none, cough- none , dullness-none, rub- none           Chest wall-  Abd-  Br/ Gen/ Rectal- Not done, not indicated Extrem- cyanosis- none, clubbing, none, atrophy- none, strength- nl,  Neuro- grossly intact to observation  Assessment & Plan:

## 2021-03-12 ENCOUNTER — Other Ambulatory Visit: Payer: Self-pay | Admitting: Orthopaedic Surgery

## 2021-03-12 ENCOUNTER — Ambulatory Visit: Payer: Medicare PPO | Admitting: Internal Medicine

## 2021-03-12 DIAGNOSIS — I82401 Acute embolism and thrombosis of unspecified deep veins of right lower extremity: Secondary | ICD-10-CM

## 2021-03-14 ENCOUNTER — Other Ambulatory Visit: Payer: Self-pay

## 2021-03-14 ENCOUNTER — Ambulatory Visit
Admission: RE | Admit: 2021-03-14 | Discharge: 2021-03-14 | Disposition: A | Discharge status: Home / Self Care | Payer: Medicare PPO | Source: Ambulatory Visit | Attending: Orthopaedic Surgery | Admitting: Orthopaedic Surgery

## 2021-03-14 DIAGNOSIS — M79661 Pain in right lower leg: Secondary | ICD-10-CM | POA: Diagnosis not present

## 2021-03-14 DIAGNOSIS — M79662 Pain in left lower leg: Secondary | ICD-10-CM | POA: Diagnosis not present

## 2021-03-14 DIAGNOSIS — I82401 Acute embolism and thrombosis of unspecified deep veins of right lower extremity: Secondary | ICD-10-CM

## 2021-05-27 DIAGNOSIS — N1831 Chronic kidney disease, stage 3a: Secondary | ICD-10-CM | POA: Diagnosis not present

## 2021-05-27 DIAGNOSIS — R150 Incomplete defecation: Secondary | ICD-10-CM | POA: Diagnosis not present

## 2021-05-27 DIAGNOSIS — I10 Essential (primary) hypertension: Secondary | ICD-10-CM | POA: Diagnosis not present

## 2021-05-27 DIAGNOSIS — Z23 Encounter for immunization: Secondary | ICD-10-CM | POA: Diagnosis not present

## 2021-05-27 DIAGNOSIS — E785 Hyperlipidemia, unspecified: Secondary | ICD-10-CM | POA: Diagnosis not present

## 2021-06-27 DIAGNOSIS — R159 Full incontinence of feces: Secondary | ICD-10-CM | POA: Diagnosis not present

## 2021-06-27 DIAGNOSIS — R194 Change in bowel habit: Secondary | ICD-10-CM | POA: Diagnosis not present

## 2021-06-27 DIAGNOSIS — K5900 Constipation, unspecified: Secondary | ICD-10-CM | POA: Diagnosis not present

## 2021-07-10 DIAGNOSIS — M4326 Fusion of spine, lumbar region: Secondary | ICD-10-CM | POA: Diagnosis not present

## 2021-07-10 DIAGNOSIS — M25561 Pain in right knee: Secondary | ICD-10-CM | POA: Diagnosis not present

## 2021-07-17 DIAGNOSIS — Z6831 Body mass index (BMI) 31.0-31.9, adult: Secondary | ICD-10-CM | POA: Diagnosis not present

## 2021-07-17 DIAGNOSIS — M25561 Pain in right knee: Secondary | ICD-10-CM | POA: Diagnosis not present

## 2021-07-17 DIAGNOSIS — I10 Essential (primary) hypertension: Secondary | ICD-10-CM | POA: Diagnosis not present

## 2021-07-17 DIAGNOSIS — M1711 Unilateral primary osteoarthritis, right knee: Secondary | ICD-10-CM | POA: Diagnosis not present

## 2021-07-23 ENCOUNTER — Other Ambulatory Visit: Payer: Self-pay | Admitting: Family Medicine

## 2021-07-23 DIAGNOSIS — Z9889 Other specified postprocedural states: Secondary | ICD-10-CM

## 2021-07-26 ENCOUNTER — Ambulatory Visit
Admission: RE | Admit: 2021-07-26 | Discharge: 2021-07-26 | Disposition: A | Discharge status: Home / Self Care | Payer: Medicare PPO | Source: Ambulatory Visit | Attending: Family Medicine | Admitting: Family Medicine

## 2021-07-26 ENCOUNTER — Encounter: Payer: Self-pay | Admitting: Oncology

## 2021-07-26 DIAGNOSIS — Z9889 Other specified postprocedural states: Secondary | ICD-10-CM

## 2021-07-26 DIAGNOSIS — Z853 Personal history of malignant neoplasm of breast: Secondary | ICD-10-CM | POA: Diagnosis not present

## 2021-07-26 DIAGNOSIS — R922 Inconclusive mammogram: Secondary | ICD-10-CM | POA: Diagnosis not present

## 2021-07-31 DIAGNOSIS — M7631 Iliotibial band syndrome, right leg: Secondary | ICD-10-CM | POA: Diagnosis not present

## 2021-07-31 DIAGNOSIS — M25561 Pain in right knee: Secondary | ICD-10-CM | POA: Diagnosis not present

## 2021-08-27 DIAGNOSIS — N183 Chronic kidney disease, stage 3 unspecified: Secondary | ICD-10-CM | POA: Diagnosis not present

## 2021-08-27 DIAGNOSIS — N1831 Chronic kidney disease, stage 3a: Secondary | ICD-10-CM | POA: Diagnosis not present

## 2021-08-27 DIAGNOSIS — E785 Hyperlipidemia, unspecified: Secondary | ICD-10-CM | POA: Diagnosis not present

## 2021-09-04 ENCOUNTER — Ambulatory Visit (INDEPENDENT_AMBULATORY_CARE_PROVIDER_SITE_OTHER): Payer: Medicare PPO

## 2021-09-04 ENCOUNTER — Ambulatory Visit: Payer: Medicare PPO | Admitting: Orthopaedic Surgery

## 2021-09-04 ENCOUNTER — Other Ambulatory Visit: Payer: Self-pay

## 2021-09-04 ENCOUNTER — Encounter: Payer: Self-pay | Admitting: Orthopaedic Surgery

## 2021-09-04 VITALS — Ht 59.0 in | Wt 158.0 lb

## 2021-09-04 DIAGNOSIS — M79604 Pain in right leg: Secondary | ICD-10-CM

## 2021-09-04 NOTE — Progress Notes (Signed)
Office Visit Note   Patient: Rachel Kelly           Date of Birth: 1946-04-05           MRN: [id number] Visit Date: 09/04/2021              Requested by: [name], MD [address] [place],  [place] [address] PCP: [name], MD   Assessment & Plan: Visit Diagnoses:  1. Pain in right leg     Plan:  Given patient's clinical exam recommend she return for follow-up with Dr.: For further evaluation of her back as the source of her radicular symptoms down the right leg.  Reassurance was given to the patient by Dr. Ninfa Linden and myself that her hip x-rays and hip exam were normal.  She had slight trochanteric bursitis like symptoms but this does not seem to bother her.  She might benefit from a trochanteric injection at some point in the future.  She will follow-up with [place] as needed.  Follow-Up Instructions: Return if symptoms worsen or fail to improve.   Orders:  Orders Placed This Encounter  Procedures   XR HIP UNILAT W OR W/O PELVIS 1V RIGHT   XR Lumbar Spine 2-3 Views   No orders of the defined types were placed in this encounter.     Procedures: No procedures performed   Clinical Data: No additional findings.   Subjective: Chief Complaint  Patient presents with   Right Hip - Pain   Right Leg - Pain    [name] 76 year old female with right hip leg pain this been ongoing for 6 months.  She reports some groin pain.  Most of her pain however is from the buttocks region on the right side to the right calf.  She has neuropathy secondary to history of breast cancer and she underwent radiation and chemo.  She states that her neuropathy is spreading.  She has had prior surgery on her back by Dr.: Last surgery some 2 years ago and the most remote being greater than 10 years ago per patient.  She denies any waking pain saddle anesthesia like symptoms or bowel bladder dysfunction.  She has had no new injury to the back hip.  She does not have pain when  lying on the hip.  Pain is 5-6 out of 10 pain at worst.  Made worse by standing.  Review of Systems  Constitutional:  Negative for chills and fever.  Musculoskeletal:  Positive for back pain and gait problem.    Objective: Vital Signs: Ht 4\' 11"  (1.499 m)    Wt 158 lb (71.7 kg)    BMI 31.91 kg/m   Physical Exam Constitutional:      Appearance: She is not ill-appearing or diaphoretic.  Cardiovascular:     Pulses: Normal pulses.  Pulmonary:     Effort: Pulmonary effort is normal.  Neurological:     Mental Status: She is alert and oriented to person, place, and time.  Psychiatric:        Mood and Affect: Mood normal.    Ortho Exam Bilateral lower extremities 5 out of 5 strength throughout against resistance.  Negative straight leg raise bilaterally.  Full flexion extension lumbar spine without pain.  Tenderness over the lower lumbar right paraspinous region.  Fluid range of motion bilateral hips.  External rotation right hip causes slight discomfort.  Slight tenderness over the right hip trochanteric region.  Bilateral knees  good range of motion without pain.  Calves are supple nontender.  Dorsiflexion plantarflexion bilateral ankles intact.  Full sensation bilateral feet to light touch throughout. Specialty Comments:  No specialty comments available.  Imaging: XR HIP UNILAT W OR W/O PELVIS 1V RIGHT  Result Date: 09/04/2021 AP pelvis lateral view right hip: Bilateral hips well located.  No acute fractures.  Right hip joint is well-preserved.  No significant arthropathy.  XR Lumbar Spine 2-3 Views  Result Date: 09/04/2021 Lumbar spine 2 views: Normal lordotic curvature.  No acute fractures or acute findings.  Status post L4-5 fusion with no hardware failure.  Degenerative disc disease at L1-L3 4 and L5-S1.  Arthrosclerosis aorta noted.    PMFS History: Patient Active Problem List   Diagnosis Date Noted   Vomiting 01/04/2020   Enteritis 01/01/2020   Chest pain 01/01/2020   CKD  (chronic kidney disease), stage III ([place]) 01/01/2020   Genetic testing 06/14/2019   Family history of prostate cancer    Family history of breast cancer    Port-A-Cath in place 08/02/2018   Pre-op evaluation 06/24/2018   Malignant neoplasm of upper-outer quadrant of right breast in female, estrogen receptor negative ([place]) 06/07/2018   Nausea & vomiting 11/21/2012   Bilateral renal masses 11/21/2012   Back pain, thoracic 08/14/2012   Obstructive sleep apnea 05/09/2012   GERD (gastroesophageal reflux disease) 06/28/2010   Hyperlipidemia 12/09/2007   Essential hypertension 12/09/2007   Sarcoidosis 11/26/2007   DYSPNEA 11/26/2007   Past Medical History:  Diagnosis Date   Anemia    Blood transfusion 2005   Family history of breast cancer    Family history of prostate cancer    Hernia    HH (hiatus hernia)    Hyperlipidemia    Hypertension    PAH (pulmonary artery hypertension) ([facility])    Personal history of chemotherapy    Personal history of radiation therapy    Sarcoidosis    Shortness of breath    on exertion   Sleep apnea    does not use cpap   Wheezing     Family History  Problem Relation Age of Onset   Clotting disorder Mother        PE   Heart disease Father    Breast cancer Maternal Grandmother        spread to brain   Coronary artery disease Other    Prostate cancer Brother 73   Breast cancer Maternal Aunt    Cancer Maternal Aunt        unk type   Prostate cancer Paternal Grandfather    Prostate cancer Brother     Past Surgical History:  Procedure Laterality Date   ABDOMINAL HYSTERECTOMY  1988   BACK SURGERY  2005   lower   BIOPSY  01/03/2020   Procedure: BIOPSY;  Surgeon: Ronnette Juniper, MD;  Location: [facility] ENDOSCOPY;  Service: Gastroenterology;;   BREAST BIOPSY Right 06/02/2018   BREAST LUMPECTOMY Right 07/02/2018   BREAST LUMPECTOMY WITH RADIOACTIVE SEED AND SENTINEL LYMPH NODE BIOPSY Right 07/02/2018   Procedure: RIGHT BREAST RADIOACTIVE SEED LUMPECTOMY  X2 AND RIGHT AXILLARY  SENTINEL LYMPH NODE BIOPSY;  Surgeon: [name], MD;  Location: [facility];  Service: General;  Laterality: Right;   ESOPHAGOGASTRODUODENOSCOPY  06/24/2012   Procedure: ESOPHAGOGASTRODUODENOSCOPY (EGD);  Surgeon: [name], MD;  Location: [name] ENDOSCOPY;  Service: General;  Laterality: N/A;   ESOPHAGOGASTRODUODENOSCOPY N/A 01/03/2020   Procedure: ESOPHAGOGASTRODUODENOSCOPY (EGD);  Surgeon: Ronnette Juniper, MD;  Location: [place];  Service: Gastroenterology;  Laterality: N/A;   HIATAL HERNIA REPAIR  08/18/2012   Procedure: LAPAROSCOPIC REPAIR OF HIATAL HERNIA;  Surgeon: [name], MD;  Location: [facility];  Service: General;  Laterality: N/A;  Laparoscopic Hiatal Hernia Repair    INSERTION OF MESH  08/18/2012   Procedure: INSERTION OF MESH;  Surgeon: [name], MD;  Location: [facility];  Service: General;;   IR IMAGING GUIDED PORT INSERTION  07/19/2018   POLYPECTOMY  01/03/2020   Procedure: POLYPECTOMY;  Surgeon: Ronnette Juniper, MD;  Location: [place];  Service: Gastroenterology;;   [facility] REMOVAL Right 08/17/2019   Procedure: REMOVAL PORT-A-CATH;  Surgeon: Jovita Kussmaul, MD;  Location: [place];  Service: General;  Laterality: Right;   PORTACATH PLACEMENT N/A 07/02/2018   Procedure: INSERTION PORT-A-CATH TRIED NO PORT IN PLACE;  Surgeon: [name], MD;  Location: [place];  Service: General;  Laterality: N/A;   VESICOVAGINAL FISTULA CLOSURE W/ TAH     Social History   Occupational History   Occupation: Retired Pharmacist, hospital  Tobacco Use   Smoking status: Never   Smokeless tobacco: Never  Vaping Use   Vaping Use: Never used  Substance and Sexual Activity   Alcohol use: No   Drug use: No   Sexual activity: Not on file

## 2021-09-25 ENCOUNTER — Telehealth: Payer: Self-pay | Admitting: *Deleted

## 2021-09-25 NOTE — Telephone Encounter (Signed)
Left vm for pt with appt date and time to see Dr. Chryl Heck on 5/17 arrive at 1pm. Contact information provided for questions or needs. ?

## 2021-09-28 DIAGNOSIS — I10 Essential (primary) hypertension: Secondary | ICD-10-CM | POA: Diagnosis not present

## 2021-09-28 DIAGNOSIS — E669 Obesity, unspecified: Secondary | ICD-10-CM | POA: Diagnosis not present

## 2021-09-28 DIAGNOSIS — R32 Unspecified urinary incontinence: Secondary | ICD-10-CM | POA: Diagnosis not present

## 2021-09-28 DIAGNOSIS — Z811 Family history of alcohol abuse and dependence: Secondary | ICD-10-CM | POA: Diagnosis not present

## 2021-09-28 DIAGNOSIS — E785 Hyperlipidemia, unspecified: Secondary | ICD-10-CM | POA: Diagnosis not present

## 2021-09-28 DIAGNOSIS — K219 Gastro-esophageal reflux disease without esophagitis: Secondary | ICD-10-CM | POA: Diagnosis not present

## 2021-09-28 DIAGNOSIS — G3184 Mild cognitive impairment, so stated: Secondary | ICD-10-CM | POA: Diagnosis not present

## 2021-09-28 DIAGNOSIS — Z6834 Body mass index (BMI) 34.0-34.9, adult: Secondary | ICD-10-CM | POA: Diagnosis not present

## 2021-09-28 DIAGNOSIS — G629 Polyneuropathy, unspecified: Secondary | ICD-10-CM | POA: Diagnosis not present

## 2021-10-02 DIAGNOSIS — K5730 Diverticulosis of large intestine without perforation or abscess without bleeding: Secondary | ICD-10-CM | POA: Diagnosis not present

## 2021-10-02 DIAGNOSIS — K648 Other hemorrhoids: Secondary | ICD-10-CM | POA: Diagnosis not present

## 2021-10-02 DIAGNOSIS — R194 Change in bowel habit: Secondary | ICD-10-CM | POA: Diagnosis not present

## 2021-10-02 DIAGNOSIS — R159 Full incontinence of feces: Secondary | ICD-10-CM | POA: Diagnosis not present

## 2021-10-22 DIAGNOSIS — M7061 Trochanteric bursitis, right hip: Secondary | ICD-10-CM | POA: Diagnosis not present

## 2021-10-22 DIAGNOSIS — Z4689 Encounter for fitting and adjustment of other specified devices: Secondary | ICD-10-CM | POA: Diagnosis not present

## 2021-10-22 DIAGNOSIS — N819 Female genital prolapse, unspecified: Secondary | ICD-10-CM | POA: Diagnosis not present

## 2021-10-22 DIAGNOSIS — M25551 Pain in right hip: Secondary | ICD-10-CM | POA: Diagnosis not present

## 2021-10-24 ENCOUNTER — Telehealth: Payer: Self-pay | Admitting: Hematology and Oncology

## 2021-10-24 DIAGNOSIS — Z4689 Encounter for fitting and adjustment of other specified devices: Secondary | ICD-10-CM | POA: Diagnosis not present

## 2021-10-24 DIAGNOSIS — K5909 Other constipation: Secondary | ICD-10-CM | POA: Diagnosis not present

## 2021-10-24 NOTE — Telephone Encounter (Signed)
Rescheduled appointment per providers template. Left message.  ? ?

## 2021-10-30 DIAGNOSIS — M7061 Trochanteric bursitis, right hip: Secondary | ICD-10-CM | POA: Diagnosis not present

## 2021-11-19 DIAGNOSIS — K859 Acute pancreatitis without necrosis or infection, unspecified: Secondary | ICD-10-CM | POA: Diagnosis not present

## 2021-11-19 DIAGNOSIS — Z6829 Body mass index (BMI) 29.0-29.9, adult: Secondary | ICD-10-CM | POA: Diagnosis not present

## 2021-12-02 DIAGNOSIS — R1013 Epigastric pain: Secondary | ICD-10-CM | POA: Diagnosis not present

## 2021-12-02 DIAGNOSIS — R634 Abnormal weight loss: Secondary | ICD-10-CM | POA: Diagnosis not present

## 2021-12-04 ENCOUNTER — Ambulatory Visit: Payer: Medicare PPO | Admitting: Hematology and Oncology

## 2021-12-04 ENCOUNTER — Other Ambulatory Visit: Payer: Medicare PPO

## 2021-12-10 ENCOUNTER — Other Ambulatory Visit: Payer: Self-pay

## 2021-12-10 DIAGNOSIS — Z171 Estrogen receptor negative status [ER-]: Secondary | ICD-10-CM

## 2021-12-11 ENCOUNTER — Encounter: Payer: Self-pay | Admitting: Hematology and Oncology

## 2021-12-11 ENCOUNTER — Inpatient Hospital Stay: Payer: Medicare PPO | Admitting: Hematology and Oncology

## 2021-12-11 ENCOUNTER — Other Ambulatory Visit: Payer: Self-pay

## 2021-12-11 ENCOUNTER — Inpatient Hospital Stay: Payer: Medicare PPO | Attending: Hematology and Oncology

## 2021-12-11 VITALS — BP 116/66 | HR 67 | Temp 98.2°F | Resp 16 | Ht 59.0 in | Wt 154.0 lb

## 2021-12-11 DIAGNOSIS — N2889 Other specified disorders of kidney and ureter: Secondary | ICD-10-CM | POA: Insufficient documentation

## 2021-12-11 DIAGNOSIS — Z79899 Other long term (current) drug therapy: Secondary | ICD-10-CM | POA: Diagnosis not present

## 2021-12-11 DIAGNOSIS — C50411 Malignant neoplasm of upper-outer quadrant of right female breast: Secondary | ICD-10-CM | POA: Diagnosis not present

## 2021-12-11 DIAGNOSIS — Z171 Estrogen receptor negative status [ER-]: Secondary | ICD-10-CM | POA: Diagnosis not present

## 2021-12-11 LAB — COMPREHENSIVE METABOLIC PANEL
ALT: 13 U/L (ref 0–44)
AST: 22 U/L (ref 15–41)
Albumin: 4 g/dL (ref 3.5–5.0)
Alkaline Phosphatase: 61 U/L (ref 38–126)
Anion gap: 5 (ref 5–15)
BUN: 17 mg/dL (ref 8–23)
CO2: 29 mmol/L (ref 22–32)
Calcium: 9.5 mg/dL (ref 8.9–10.3)
Chloride: 107 mmol/L (ref 98–111)
Creatinine, Ser: 1.16 mg/dL — ABNORMAL HIGH (ref 0.44–1.00)
GFR, Estimated: 49 mL/min — ABNORMAL LOW (ref >60)
Glucose, Bld: 87 mg/dL (ref 70–99)
Potassium: 3.9 mmol/L (ref 3.5–5.1)
Sodium: 141 mmol/L (ref 135–145)
Total Bilirubin: 0.6 mg/dL (ref 0.3–1.2)
Total Protein: 7.4 g/dL (ref 6.5–8.1)

## 2021-12-11 LAB — CBC WITH DIFFERENTIAL (CANCER CENTER ONLY)
Abs Immature Granulocytes: 0.01 10*3/uL (ref 0.00–0.07)
Basophils Absolute: 0 10*3/uL (ref 0.0–0.1)
Basophils Relative: 1 %
Eosinophils Absolute: 0.1 10*3/uL (ref 0.0–0.5)
Eosinophils Relative: 1 %
HCT: 38.6 % (ref 36.0–46.0)
Hemoglobin: 12.9 g/dL (ref 12.0–15.0)
Immature Granulocytes: 0 %
Lymphocytes Relative: 33 %
Lymphs Abs: 1.9 10*3/uL (ref 0.7–4.0)
MCH: 31 pg (ref 26.0–34.0)
MCHC: 33.4 g/dL (ref 30.0–36.0)
MCV: 92.8 fL (ref 80.0–100.0)
Monocytes Absolute: 0.5 10*3/uL (ref 0.1–1.0)
Monocytes Relative: 8 %
Neutro Abs: 3.3 10*3/uL (ref 1.7–7.7)
Neutrophils Relative %: 57 %
Platelet Count: 208 10*3/uL (ref 150–400)
RBC: 4.16 MIL/uL (ref 3.87–5.11)
RDW: 13.1 % (ref 11.5–15.5)
WBC Count: 5.8 10*3/uL (ref 4.0–10.5)
nRBC: 0 % (ref 0.0–0.2)

## 2021-12-11 NOTE — Progress Notes (Signed)
Rachel Kelly  [phone] [phone]     ID: LARK LANGENFELD DOB: 08/22/1945  MR#: [id number]  POE#:[id number]  Patient Care Team: [name], MD as PCP - General (Family Medicine) Deneise Lever, MD as Referring Physician (Pulmonary Disease) [name], MD (Inactive) as Consulting Physician (General Surgery) [name], [name], MD (Inactive) as Consulting Physician (Oncology) Eppie Gibson, MD as Attending Physician (Radiation Oncology) Chelsea Aus, DDS as Consulting Physician (Dentistry) Harriett Sine, MD as Consulting Physician (Dermatology) Bensimhon, Shaune Pascal, MD as Consulting Physician (Cardiology) Mauro Kaufmann, RN as Oncology Nurse Navigator Rockwell Germany, RN as Oncology Nurse Navigator Starling Manns, MD (Orthopedic Surgery) Alexis Frock, MD as Consulting Physician (Urology) OTHER MD:  CHIEF COMPLAINT: HER-2 positive, estrogen receptor negative breast cancer  CURRENT TREATMENT: observation   INTERVAL HISTORY: Rachel Kelly returns today for follow up of her Her2 positive breast cancer. Since last visit, she went to [place] and got sick while she was there. Last mammogram Jan 2023, [facility] 2. No new cough, chest pain, SOB No change in bowel habits or urinary habits No headaches, falls, new balance issues. Rest of the pertinent 10 point ROS reviewed and negative.   COVID 19 VACCINATION STATUS: fully vaccinated [employer]), with booster 04/2020   HISTORY OF CURRENT ILLNESS: From the original intake note:  [name] had routine screening mammography on 05/26/2018 showing a possible abnormality in the right breast. She underwent bilateral diagnostic mammography with tomography and right breast ultrasonography at [facility] on 06/02/2018 showing: breast density category C, suspicious mass at the 10 o'clock position 8 cm from the nipple. It measures 10 x 7 x 6 mm. Just adjacent to this mass, there is an irregular  hypoechoic mass with punctate echogenic foci. It measures 9 x 8 x 3 mm. Additional indeterminate mass at the 9 o'clock position 6 cm from the nipple. It measures 6 x 4 x 4 mm. No suspicious right axillary lymphadenopathy.  Accordingly on 06/02/2018 she proceeded to biopsy of two of the right breast areas in question. The pathology from this procedure showed [phone]): at the 10 o'clock position, invasive ductal carcinoma, grade 2, estrogen and progesterone receptor negative, HER-2 equivocal by immunohistochemistry at 2+, but negative by [facility] with a signals ratio of 1.47 and number per cell 2.20.  Biopsy of the right breast lesion at the 9 o'clock position showed a sclerotic lesion with calcifications and negative for carcinoma.  The patient's subsequent history is as detailed below.   PAST MEDICAL HISTORY: Past Medical History:  Diagnosis Date   Anemia    Blood transfusion 2005   Family history of breast cancer    Family history of prostate cancer    Hernia    HH (hiatus hernia)    Hyperlipidemia    Hypertension    PAH (pulmonary artery hypertension) ([place])    Personal history of chemotherapy    Personal history of radiation therapy    Sarcoidosis    Shortness of breath    on exertion   Sleep apnea    does not use cpap   Wheezing     PAST SURGICAL HISTORY: Past Surgical History:  Procedure Laterality Date   ABDOMINAL HYSTERECTOMY  1988   BACK SURGERY  2005   lower   BIOPSY  01/03/2020   Procedure: BIOPSY;  Surgeon: Ronnette Juniper, MD;  Location: [place];  Service: Gastroenterology;;   BREAST BIOPSY Right 06/02/2018   BREAST LUMPECTOMY Right 07/02/2018   BREAST LUMPECTOMY  WITH RADIOACTIVE SEED AND SENTINEL LYMPH NODE BIOPSY Right 07/02/2018   Procedure: RIGHT BREAST RADIOACTIVE SEED LUMPECTOMY X2 AND RIGHT AXILLARY  SENTINEL LYMPH NODE BIOPSY;  Surgeon: [name], MD;  Location: [place];  Service: General;  Laterality: Right;   ESOPHAGOGASTRODUODENOSCOPY   06/24/2012   Procedure: ESOPHAGOGASTRODUODENOSCOPY (EGD);  Surgeon: [name], MD;  Location: [name] ENDOS[facility];  Service: General;  Laterality: N/A;   ESOPHAGOGASTRODUODENOSCOPY N/A 01/03/2020   Procedure: ESOPHAGOGASTRODUODENOSCOPY (EGD);  Surgeon: Ronnette Juniper, MD;  Location: [place];  Service: Gastroenterology;  Laterality: N/A;   HIATAL HERNIA REPAIR  08/18/2012   Procedure: LAPAROSCOPIC REPAIR OF HIATAL HERNIA;  Surgeon: [name], MD;  [facility];  Service: General;  Laterality: N/A;  Laparoscopic Hiatal Hernia Repair    INSERTION OF MESH  08/18/2012   Procedure: INSERTION OF MESH;  Surgeon: [name], MD;  [facility];  Service: General;;   IR IMAGING GUIDED PORT INSERTION  07/19/2018   POLYPECTOMY  01/03/2020   Procedure: POLYPECTOMY;  Surgeon: Ronnette Juniper, MD;  Location: [facility] ENDOSCOPY;  Service: Gastroenterology;;   [facility] REMOVAL Right 08/17/2019   Procedure: REMOVAL PORT-A-CATH;  Surgeon: Jovita Kussmaul, MD;  Location: [place];  Service: General;  Laterality: Right;   PORTACATH PLACEMENT N/A 07/02/2018   Procedure: INSERTION PORT-A-CATH TRIED NO PORT IN PLACE;  Surgeon: [name], MD;  Location: [place];  Service: General;  Laterality: N/A;   VESICOVAGINAL FISTULA CLOSURE W/ TAH      FAMILY HISTORY Family History  Problem Relation Age of Onset   Clotting disorder Mother        PE   Heart disease Father    Breast cancer Maternal Grandmother        spread to brain   Coronary artery disease Other    Prostate cancer Brother 70   Breast cancer Maternal Aunt    Cancer Maternal Aunt        unk type   Prostate cancer Paternal Grandfather    Prostate cancer Brother   She notes that her father died from heart disease at age 28. Patients' mother lives in a nursing home and is 35 (as of November 2019)-- she has a form of dementia. The patient has 3 brothers and 0 sisters. A maternal aunt had a form of cancer, not clear what  it was.  The maternal grandmother had breast cancer, but was never treated because of her old age.  The patient does not think anyone in her family had ovarian cancer, but there are 2 others in her family that had colon cancer.    GYNECOLOGIC HISTORY:  No LMP recorded. Patient has had a hysterectomy. 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  She lives alone. She does not have any pets.  Her daughter Deatra Canter "Cantrece" [place] Silos, 48, lives in [place] and works in Psychologist, educational. No grandchildren.  The patient belongs to [place]: In place, but she can't remember who her power of attorney is (believes it is her daughter).   HEALTH MAINTENANCE: Social History   Tobacco Use   Smoking status: Never   Smokeless tobacco: Never  Vaping Use   Vaping Use: Never used  Substance Use Topics   Alcohol use:  No   Drug use: No    Colonoscopy: Dr. Collene Mares  PAP: yes, but not sure when  Bone density: No   Allergies  Allergen Reactions   Shellfish Allergy Other (See Comments)    Pain all over   Enbucrilate Itching and Rash    steri strips   Penicillins Rash    Fine red bumps that spread all over the body. Has patient had a PCN reaction causing immediate rash, facial/tongue/throat swelling, SOB or lightheadedness with hypotension: No Has patient had a PCN reaction causing severe rash involving mucus membranes or skin necrosis: No Has patient had a PCN reaction that required hospitalization: No Has patient had a PCN reaction occurring within the last 10 years: No If all of the above answers are "NO", then may proceed with Cephalosporin use.    Skin Adhesives [Cyanoacrylate] Itching and Rash    steri strips    Current Outpatient Medications  Medication  Sig Dispense Refill   aspirin-sod bicarb-citric acid (ALKA-SELTZER) 325 MG TBEF tablet Take 325 mg by mouth every 6 (six) hours as needed (cold symptoms).      Coenzyme Q10 200 MG TABS Take 200 mg by mouth daily.     gabapentin (NEURONTIN) 100 MG capsule Take 1 capsule (100 mg total) by mouth at bedtime. 90 capsule 4   hydrocortisone 2.5 % cream Apply 1 application topically 2 (two) times daily as needed (dry skin).     ketotifen (ALAWAY) 0.025 % ophthalmic solution Place 1 drop into both eyes 2 (two) times daily as needed (itchy, dry eyes).      methocarbamol (ROBAXIN) 500 MG tablet Take 500 mg by mouth 3 (three) times daily as needed for pain.     ondansetron (ZOFRAN ODT) 4 MG disintegrating tablet Take 1 tablet (4 mg total) by mouth every 8 (eight) hours as needed for nausea or vomiting. 20 tablet 0   pantoprazole (PROTONIX) 40 MG tablet Take 1 tablet (40 mg total) by mouth 2 (two) times daily before a meal for 60 days, THEN 1 tablet (40 mg total) daily. 180 tablet 2   Polyvinyl Alcohol-Povidone PF (REFRESH) 1.4-0.6 % SOLN Place 1 drop into both eyes daily as needed (dry eyes).      potassium gluconate 595 MG TABS Take 595 mg by mouth daily.      simvastatin (ZOCOR) 20 MG tablet Take 20 mg by mouth every morning.      triamterene-hydrochlorothiazide (MAXZIDE-25) 37.5-25 MG per tablet Take 1 tablet by mouth every morning.      No current facility-administered medications for this visit.    OBJECTIVE: African-American woman who looks well  Vitals:   12/11/21 1325  BP: 116/66  Pulse: 67  Resp: 16  Temp: 98.2 F (36.8 C)  SpO2: 98%   Wt Readings from Last 3 Encounters:  12/11/21 154 lb (69.9 kg)  09/04/21 158 lb (71.7 kg)  12/03/20 160 lb 1.6 oz (72.6 kg)   Physical Exam Constitutional:      Appearance: Normal appearance.  Chest:     Comments: Right breast s/p lumpectomy. No palpable masses or regional adenopathy. Musculoskeletal:        General: No swelling or tenderness.      Cervical back: Normal range of motion and neck supple. No rigidity.  Lymphadenopathy:     Cervical: No cervical adenopathy.  Neurological:     Mental Status: She is alert.      LAB RESULTS:  CMP     Component Value Date/Time  NA 141 12/11/2021 1256   K 3.9 12/11/2021 1256   CL 107 12/11/2021 1256   CO2 29 12/11/2021 1256   GLUCOSE 87 12/11/2021 1256   BUN 17 12/11/2021 1256   CREATININE 1.16 (H) 12/11/2021 1256   CREATININE 1.18 (H) 01/17/2019 1305   CALCIUM 9.5 12/11/2021 1256   PROT 7.4 12/11/2021 1256   ALBUMIN 4.0 12/11/2021 1256   AST 22 12/11/2021 1256   AST 24 01/17/2019 1305   ALT 13 12/11/2021 1256   ALT 17 01/17/2019 1305   ALKPHOS 61 12/11/2021 1256   BILITOT 0.6 12/11/2021 1256   BILITOT 0.3 01/17/2019 1305   GFRNONAA 49 (L) 12/11/2021 1256   GFRNONAA 45 (L) 01/17/2019 1305   GFRAA 49 (L) 01/05/2020 0150   GFRAA 53 (L) 01/17/2019 1305    No results found for: TOTALPROTELP, ALBUMINELP, A1GS, A2GS, BETS, BETA2SER, GAMS, MSPIKE, SPEI  No results found for: KPAFRELGTCHN, LAMBDASER, KAPLAMBRATIO  Lab Results  Component Value Date   WBC 5.8 12/11/2021   NEUTROABS 3.3 12/11/2021   HGB 12.9 12/11/2021   HCT 38.6 12/11/2021   MCV 92.8 12/11/2021   PLT 208 12/11/2021    No results found for: LABCA2  No components found for: PFXTKW409  No results for input(s): INR in the last 168 hours.  No results found for: LABCA2  No results found for: BDZ329  No results found for: JME268  No results found for: TMH962  No results found for: CA2729  No components found for: HGQUANT  No results found for: CEA1 / No results found for: CEA1   No results found for: AFPTUMOR  No results found for: CHROMOGRNA  No results found for: HGBA, HGBA2QUANT, HGBFQUANT, HGBSQUAN (Hemoglobinopathy evaluation)   No results found for: LDH  No results found for: IRON, TIBC, IRONPCTSAT (Iron and TIBC)  No results found for: FERRITIN  Urinalysis    Component Value  Date/Time   COLORURINE STRAW (A) 07/27/2018 1050   APPEARANCEUR CLEAR 07/27/2018 1050   LABSPEC 1.020 08/05/2019 1155   PHURINE 6.0 08/05/2019 1155   GLUCOSEU NEGATIVE 08/05/2019 1155   HGBUR MODERATE (A) 08/05/2019 [place] 08/05/2019 1155   KETONESUR NEGATIVE 08/05/2019 1155   PROTEINUR 30 (A) 08/05/2019 1155   UROBILINOGEN 0.2 08/05/2019 1155   NITRITE POSITIVE (A) 08/05/2019 1155   LEUKOCYTESUR LARGE (A) 08/05/2019 1155    STUDIES:  No results found.   ELIGIBLE FOR AVAILABLE RESEARCH PROTOCOL: PREVENT  ASSESSMENT: 76 y.o. [place] woman status post right breast upper outer quadrant biopsy 06/02/2018 for a clinical T1b N0, stage IB invasive ductal carcinoma, grade 2, triple negative, with an MIB-1 of 10%  (1) status post right lumpectomy and sentinel lymph node sampling 07/02/2018 for a pT1b pN0,stage IA invasive ductal carcinoma, grade 2, now HER-2 positive, still estrogen and progesterone receptor negative, with an MIB-1 of 1%  (2) adjuvant chemotherapy consisting of paclitaxel x12 with trastuzumab given weekly started 07/27/2018  (a) paclitaxel discontinued after 3 doses because of neuropathy-- last dose 08/09/2018  (3) trastuzumab continued to total 1 year (last dose 07/04/2019)  (a) echocardiogram 07/22/2018 showed an ejection fraction in the 55-60% range.  (b) Echocardiogram on 12/14/2018 shows EF of 55-60%  (c) echo 03/03/2019 shows ejection fraction in the 50-55% range  (d) echo 06/08/2019 shows an ejection fraction in the 50-55% range  (3) adjuvant radiation 09/20/2018 - 10/15/2018  (a) right breast / 40.05 Gy in 15 fractions  (b) boost / 10 Gy in 5 fractions  (4)  genetics testing 06/01/2019 through the [facility] Common Hereditary Cancers Panel found no deleterious mutations in APC, ATM, AXIN2, BARD1, BMPR1A, BRCA1, BRCA2, BRIP1, CDH1, CDKN2A (p14ARF), CDKN2A (p16INK4a), CKD4, CHEK2, CTNNA1, DICER1, EPCAM (Deletion/duplication testing only), GREM1  (promoter region deletion/duplication testing only), KIT, MEN1, MLH1, MSH2, MSH3, MSH6, MUTYH, NBN, NF1, NHTL1, PALB2, PDGFRA, PMS2, POLD1, POLE, PTEN, RAD50, RAD51C, RAD51D, RNF43, SDHB, SDHC, SDHD, SMAD4, SMARCA4. STK11, TP53, TSC1, TSC2, and VHL.  The following genes were evaluated for sequence changes only: SDHA and HOXB13 c.251G>A variant only. The report date is 06/01/2019.  (5) bilateral renal masses followed by [name], with yearly scans (she is overdue for it)   PLAN: Amiera is now 3.5 years out from definitive surgery for her breast cancer with no evidence of disease recurrence.   This is very favorable particularly as estrogen receptor negative tumors if they recur tend to recur early. No concerning ROS today. PE today unremarkable. At this time, there is no concern for breast cancer recurrence She was instructed to follow up with rest of her medical team for recommendations and urology for annual imaging  *Total Encounter Time as defined by the Centers for Medicare and Medicaid Services includes, in addition to the face-to-face time of a patient visit (documented in the note above) non-face-to-face time: obtaining and reviewing outside history, ordering and reviewing medications, tests or procedures, care coordination (communications with other health care professionals or caregivers) and documentation in the medical record.

## 2021-12-17 DIAGNOSIS — Z0000 Encounter for general adult medical examination without abnormal findings: Secondary | ICD-10-CM | POA: Diagnosis not present

## 2021-12-17 DIAGNOSIS — K859 Acute pancreatitis without necrosis or infection, unspecified: Secondary | ICD-10-CM | POA: Diagnosis not present

## 2021-12-17 DIAGNOSIS — I69811 Memory deficit following other cerebrovascular disease: Secondary | ICD-10-CM | POA: Diagnosis not present

## 2021-12-24 ENCOUNTER — Other Ambulatory Visit: Payer: Self-pay | Admitting: Oncology

## 2022-01-17 ENCOUNTER — Other Ambulatory Visit (HOSPITAL_BASED_OUTPATIENT_CLINIC_OR_DEPARTMENT_OTHER): Payer: Self-pay | Admitting: Gastroenterology

## 2022-01-17 ENCOUNTER — Other Ambulatory Visit: Payer: Self-pay | Admitting: Gastroenterology

## 2022-01-17 DIAGNOSIS — K219 Gastro-esophageal reflux disease without esophagitis: Secondary | ICD-10-CM | POA: Diagnosis not present

## 2022-01-17 DIAGNOSIS — R1084 Generalized abdominal pain: Secondary | ICD-10-CM

## 2022-01-17 DIAGNOSIS — R112 Nausea with vomiting, unspecified: Secondary | ICD-10-CM | POA: Diagnosis not present

## 2022-01-24 ENCOUNTER — Ambulatory Visit (HOSPITAL_COMMUNITY)
Admission: RE | Admit: 2022-01-24 | Discharge: 2022-01-24 | Disposition: A | Discharge status: Home / Self Care | Payer: Medicare PPO | Source: Ambulatory Visit | Attending: Gastroenterology | Admitting: Gastroenterology

## 2022-01-24 DIAGNOSIS — R112 Nausea with vomiting, unspecified: Secondary | ICD-10-CM | POA: Diagnosis not present

## 2022-01-24 DIAGNOSIS — N289 Disorder of kidney and ureter, unspecified: Secondary | ICD-10-CM | POA: Diagnosis not present

## 2022-01-24 DIAGNOSIS — C50919 Malignant neoplasm of unspecified site of unspecified female breast: Secondary | ICD-10-CM | POA: Diagnosis not present

## 2022-01-24 DIAGNOSIS — R1084 Generalized abdominal pain: Secondary | ICD-10-CM

## 2022-01-24 MED ORDER — IOHEXOL 300 MG/ML  SOLN
100.0000 mL | Freq: Once | INTRAMUSCULAR | Status: AC | PRN
  Administered 2022-01-24: 100 mL via INTRAVENOUS

## 2022-01-28 DIAGNOSIS — N819 Female genital prolapse, unspecified: Secondary | ICD-10-CM | POA: Diagnosis not present

## 2022-01-28 DIAGNOSIS — Z4689 Encounter for fitting and adjustment of other specified devices: Secondary | ICD-10-CM | POA: Diagnosis not present

## 2022-02-11 ENCOUNTER — Other Ambulatory Visit: Payer: Self-pay | Admitting: Family Medicine

## 2022-02-11 DIAGNOSIS — K859 Acute pancreatitis without necrosis or infection, unspecified: Secondary | ICD-10-CM | POA: Diagnosis not present

## 2022-02-11 DIAGNOSIS — Z1329 Encounter for screening for other suspected endocrine disorder: Secondary | ICD-10-CM | POA: Diagnosis not present

## 2022-02-11 DIAGNOSIS — R413 Other amnesia: Secondary | ICD-10-CM

## 2022-02-11 DIAGNOSIS — Z6828 Body mass index (BMI) 28.0-28.9, adult: Secondary | ICD-10-CM | POA: Diagnosis not present

## 2022-02-11 DIAGNOSIS — R739 Hyperglycemia, unspecified: Secondary | ICD-10-CM | POA: Diagnosis not present

## 2022-02-11 DIAGNOSIS — I10 Essential (primary) hypertension: Secondary | ICD-10-CM | POA: Diagnosis not present

## 2022-02-27 ENCOUNTER — Other Ambulatory Visit: Payer: Medicare PPO

## 2022-04-03 ENCOUNTER — Telehealth: Payer: Self-pay

## 2022-04-03 NOTE — Patient Outreach (Signed)
  Care Coordination   04/03/2022 Name: Rachel Kelly MRN: [id number] DOB: 27-Nov-1945   Care Coordination Outreach Attempts:  An unsuccessful telephone outreach was attempted today to offer the patient information about available care coordination services as a benefit of their health plan.   Follow Up Plan:  Additional outreach attempts will be made to offer the patient care coordination information and services.   Encounter Outcome:  No Answer  Care Coordination Interventions Activated:  No   Care Coordination Interventions:  No, not indicated      Enzo Montgomery, RN,BSN,CCM [facility] Telephonic Care Management Coordinator Direct Phone: [phone] Toll Free: [phone] Fax: [phone]

## 2022-04-09 ENCOUNTER — Telehealth: Payer: Self-pay

## 2022-04-09 NOTE — Patient Outreach (Signed)
  Care Coordination   04/09/2022 Name: Rachel Kelly MRN: [id number] DOB: 07-03-1946   Care Coordination Outreach Attempts:  A second unsuccessful outreach was attempted today to offer the patient with information about available care coordination services as a benefit of their health plan.     Follow Up Plan:  Additional outreach attempts will be made to offer the patient care coordination information and services.   Encounter Outcome:  No Answer  Care Coordination Interventions Activated:  No   Care Coordination Interventions:  No, not indicated      Enzo Montgomery, RN,BSN,CCM [facility] Telephonic Care Management Coordinator Direct Phone: [phone] Toll Free: [phone] Fax: [phone]

## 2022-04-14 ENCOUNTER — Telehealth: Payer: Self-pay

## 2022-04-14 NOTE — Patient Outreach (Signed)
  Care Coordination   04/14/2022 Name: Rachel Kelly MRN: [id number] DOB: 05/12/1946   Care Coordination Outreach Attempts:  A third unsuccessful outreach was attempted today to offer the patient with information about available care coordination services as a benefit of their health plan.   Follow Up Plan:  No further outreach attempts will be made at this time. We have been unable to contact the patient to offer or enroll patient in care coordination services  Encounter Outcome:  No Answer  Care Coordination Interventions Activated:  No   Care Coordination Interventions:  No, not indicated      Enzo Montgomery, RN,BSN,CCM [facility] Telephonic Care Management Coordinator Direct Phone: [phone] Toll Free: [phone] Fax: [phone]

## 2022-05-01 DIAGNOSIS — Z4689 Encounter for fitting and adjustment of other specified devices: Secondary | ICD-10-CM | POA: Diagnosis not present

## 2022-05-05 DIAGNOSIS — G4733 Obstructive sleep apnea (adult) (pediatric): Secondary | ICD-10-CM | POA: Diagnosis not present

## 2022-05-05 DIAGNOSIS — I69811 Memory deficit following other cerebrovascular disease: Secondary | ICD-10-CM | POA: Diagnosis not present

## 2022-05-05 DIAGNOSIS — K859 Acute pancreatitis without necrosis or infection, unspecified: Secondary | ICD-10-CM | POA: Diagnosis not present

## 2022-05-05 DIAGNOSIS — K219 Gastro-esophageal reflux disease without esophagitis: Secondary | ICD-10-CM | POA: Diagnosis not present

## 2022-05-05 DIAGNOSIS — Q6102 Congenital multiple renal cysts: Secondary | ICD-10-CM | POA: Diagnosis not present

## 2022-05-05 DIAGNOSIS — I129 Hypertensive chronic kidney disease with stage 1 through stage 4 chronic kidney disease, or unspecified chronic kidney disease: Secondary | ICD-10-CM | POA: Diagnosis not present

## 2022-05-05 DIAGNOSIS — I10 Essential (primary) hypertension: Secondary | ICD-10-CM | POA: Diagnosis not present

## 2022-05-05 DIAGNOSIS — N183 Chronic kidney disease, stage 3 unspecified: Secondary | ICD-10-CM | POA: Diagnosis not present

## 2022-05-05 DIAGNOSIS — Z6828 Body mass index (BMI) 28.0-28.9, adult: Secondary | ICD-10-CM | POA: Diagnosis not present

## 2022-05-05 DIAGNOSIS — Z23 Encounter for immunization: Secondary | ICD-10-CM | POA: Diagnosis not present

## 2022-06-24 ENCOUNTER — Telehealth: Payer: Self-pay | Admitting: *Deleted

## 2022-06-24 ENCOUNTER — Other Ambulatory Visit: Payer: Self-pay | Admitting: *Deleted

## 2022-06-24 DIAGNOSIS — Z171 Estrogen receptor negative status [ER-]: Secondary | ICD-10-CM

## 2022-06-24 MED ORDER — GABAPENTIN 100 MG PO CAPS: 100.0000 mg | ORAL_CAPSULE | Freq: Every day | ORAL | 4 refills | Status: AC

## 2022-06-24 NOTE — Telephone Encounter (Signed)
Patient called - asked for prescription for gabapentin. Past patient of [name]. Appt with Dr. Chryl Heck on 12/11/21 and she has f/u 12/12/22. 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 Last rx was written in July 2022 for 90 w/4 refills.  Dr. Chryl Heck informed. Per Dr. Chryl Heck, ok to refill.Refill sent. Contacted patient with this information

## 2022-08-04 DIAGNOSIS — Z4689 Encounter for fitting and adjustment of other specified devices: Secondary | ICD-10-CM | POA: Diagnosis not present

## 2022-08-11 DIAGNOSIS — K859 Acute pancreatitis without necrosis or infection, unspecified: Secondary | ICD-10-CM | POA: Diagnosis not present

## 2022-08-11 DIAGNOSIS — K8500 Idiopathic acute pancreatitis without necrosis or infection: Secondary | ICD-10-CM | POA: Diagnosis not present

## 2022-08-11 DIAGNOSIS — K219 Gastro-esophageal reflux disease without esophagitis: Secondary | ICD-10-CM | POA: Diagnosis not present

## 2022-08-11 DIAGNOSIS — N1831 Chronic kidney disease, stage 3a: Secondary | ICD-10-CM | POA: Diagnosis not present

## 2022-08-11 DIAGNOSIS — N183 Chronic kidney disease, stage 3 unspecified: Secondary | ICD-10-CM | POA: Diagnosis not present

## 2022-08-11 DIAGNOSIS — I129 Hypertensive chronic kidney disease with stage 1 through stage 4 chronic kidney disease, or unspecified chronic kidney disease: Secondary | ICD-10-CM | POA: Diagnosis not present

## 2022-08-11 DIAGNOSIS — I10 Essential (primary) hypertension: Secondary | ICD-10-CM | POA: Diagnosis not present

## 2022-08-11 DIAGNOSIS — Q6102 Congenital multiple renal cysts: Secondary | ICD-10-CM | POA: Diagnosis not present

## 2022-08-11 DIAGNOSIS — G4733 Obstructive sleep apnea (adult) (pediatric): Secondary | ICD-10-CM | POA: Diagnosis not present

## 2022-08-15 DIAGNOSIS — H26493 Other secondary cataract, bilateral: Secondary | ICD-10-CM | POA: Diagnosis not present

## 2022-08-15 DIAGNOSIS — H35361 Drusen (degenerative) of macula, right eye: Secondary | ICD-10-CM | POA: Diagnosis not present

## 2022-08-15 DIAGNOSIS — H04123 Dry eye syndrome of bilateral lacrimal glands: Secondary | ICD-10-CM | POA: Diagnosis not present

## 2022-08-15 DIAGNOSIS — H402231 Chronic angle-closure glaucoma, bilateral, mild stage: Secondary | ICD-10-CM | POA: Diagnosis not present

## 2022-08-20 DIAGNOSIS — H26491 Other secondary cataract, right eye: Secondary | ICD-10-CM | POA: Diagnosis not present

## 2022-08-26 ENCOUNTER — Other Ambulatory Visit: Payer: Self-pay | Admitting: Family Medicine

## 2022-08-26 DIAGNOSIS — Z853 Personal history of malignant neoplasm of breast: Secondary | ICD-10-CM

## 2022-08-29 ENCOUNTER — Other Ambulatory Visit: Payer: Self-pay | Admitting: Hematology and Oncology

## 2022-08-29 ENCOUNTER — Encounter: Payer: Self-pay | Admitting: Hematology and Oncology

## 2022-08-29 DIAGNOSIS — Z853 Personal history of malignant neoplasm of breast: Secondary | ICD-10-CM

## 2022-09-01 ENCOUNTER — Inpatient Hospital Stay: Admission: RE | Admit: 2022-09-01 | Payer: Medicare PPO | Source: Ambulatory Visit

## 2022-09-10 DIAGNOSIS — H26492 Other secondary cataract, left eye: Secondary | ICD-10-CM | POA: Diagnosis not present

## 2022-09-11 ENCOUNTER — Ambulatory Visit
Admission: RE | Admit: 2022-09-11 | Discharge: 2022-09-11 | Disposition: A | Discharge status: Home / Self Care | Payer: Medicare PPO | Source: Ambulatory Visit | Attending: Hematology and Oncology | Admitting: Hematology and Oncology

## 2022-09-11 ENCOUNTER — Encounter: Payer: Self-pay | Admitting: Oncology

## 2022-09-11 DIAGNOSIS — Z853 Personal history of malignant neoplasm of breast: Secondary | ICD-10-CM | POA: Diagnosis not present

## 2022-09-11 DIAGNOSIS — R922 Inconclusive mammogram: Secondary | ICD-10-CM | POA: Diagnosis not present

## 2022-10-06 DIAGNOSIS — I10 Essential (primary) hypertension: Secondary | ICD-10-CM | POA: Diagnosis not present

## 2022-10-06 DIAGNOSIS — Q6102 Congenital multiple renal cysts: Secondary | ICD-10-CM | POA: Diagnosis not present

## 2022-10-06 DIAGNOSIS — R197 Diarrhea, unspecified: Secondary | ICD-10-CM | POA: Diagnosis not present

## 2022-10-06 DIAGNOSIS — G4733 Obstructive sleep apnea (adult) (pediatric): Secondary | ICD-10-CM | POA: Diagnosis not present

## 2022-10-06 DIAGNOSIS — K219 Gastro-esophageal reflux disease without esophagitis: Secondary | ICD-10-CM | POA: Diagnosis not present

## 2022-10-06 DIAGNOSIS — N1831 Chronic kidney disease, stage 3a: Secondary | ICD-10-CM | POA: Diagnosis not present

## 2022-10-12 ENCOUNTER — Encounter (HOSPITAL_COMMUNITY): Payer: Self-pay | Admitting: Emergency Medicine

## 2022-10-12 ENCOUNTER — Emergency Department (HOSPITAL_COMMUNITY)
Admission: EM | Admit: 2022-10-12 | Discharge: 2022-10-12 | Disposition: A | Discharge status: Home / Self Care | Payer: Medicare PPO | Attending: Emergency Medicine | Admitting: Emergency Medicine

## 2022-10-12 ENCOUNTER — Other Ambulatory Visit: Payer: Self-pay

## 2022-10-12 DIAGNOSIS — R112 Nausea with vomiting, unspecified: Secondary | ICD-10-CM

## 2022-10-12 DIAGNOSIS — R197 Diarrhea, unspecified: Secondary | ICD-10-CM | POA: Diagnosis not present

## 2022-10-12 LAB — COMPREHENSIVE METABOLIC PANEL
ALT: 19 U/L (ref 0–44)
AST: 37 U/L (ref 15–41)
Albumin: 3.6 g/dL (ref 3.5–5.0)
Alkaline Phosphatase: 58 U/L (ref 38–126)
Anion gap: 8 (ref 5–15)
BUN: 18 mg/dL (ref 8–23)
CO2: 19 mmol/L — ABNORMAL LOW (ref 22–32)
Calcium: 8.7 mg/dL — ABNORMAL LOW (ref 8.9–10.3)
Chloride: 109 mmol/L (ref 98–111)
Creatinine, Ser: 0.94 mg/dL (ref 0.44–1.00)
GFR, Estimated: >60 mL/min (ref >60)
Glucose, Bld: 99 mg/dL (ref 70–99)
Potassium: 4.9 mmol/L (ref 3.5–5.1)
Sodium: 136 mmol/L (ref 135–145)
Total Bilirubin: 1.4 mg/dL — ABNORMAL HIGH (ref 0.3–1.2)
Total Protein: 6.6 g/dL (ref 6.5–8.1)

## 2022-10-12 LAB — CBC WITH DIFFERENTIAL/PLATELET
Abs Immature Granulocytes: 0.03 10*3/uL (ref 0.00–0.07)
Basophils Absolute: 0 10*3/uL (ref 0.0–0.1)
Basophils Relative: 0 %
Eosinophils Absolute: 0.1 10*3/uL (ref 0.0–0.5)
Eosinophils Relative: 1 %
HCT: 39 % (ref 36.0–46.0)
Hemoglobin: 12.9 g/dL (ref 12.0–15.0)
Immature Granulocytes: 1 %
Lymphocytes Relative: 25 %
Lymphs Abs: 1.3 10*3/uL (ref 0.7–4.0)
MCH: 31.5 pg (ref 26.0–34.0)
MCHC: 33.1 g/dL (ref 30.0–36.0)
MCV: 95.4 fL (ref 80.0–100.0)
Monocytes Absolute: 0.4 10*3/uL (ref 0.1–1.0)
Monocytes Relative: 8 %
Neutro Abs: 3.5 10*3/uL (ref 1.7–7.7)
Neutrophils Relative %: 65 %
Platelets: 168 10*3/uL (ref 150–400)
RBC: 4.09 MIL/uL (ref 3.87–5.11)
RDW: 13.3 % (ref 11.5–15.5)
WBC: 5.4 10*3/uL (ref 4.0–10.5)
nRBC: 0 % (ref 0.0–0.2)

## 2022-10-12 LAB — MAGNESIUM: Magnesium: 1.8 mg/dL (ref 1.7–2.4)

## 2022-10-12 LAB — LIPASE, BLOOD: Lipase: 31 U/L (ref 11–51)

## 2022-10-12 MED ORDER — SODIUM CHLORIDE 0.9 % IV BOLUS
1000.0000 mL | Freq: Once | INTRAVENOUS | Status: AC
  Administered 2022-10-12: 1000 mL via INTRAVENOUS

## 2022-10-12 MED ORDER — MORPHINE SULFATE (PF) 2 MG/ML IV SOLN
2.0000 mg | Freq: Once | INTRAVENOUS | Status: DC
  Filled 2022-10-12: qty 1

## 2022-10-12 MED ORDER — ONDANSETRON HCL 4 MG/2ML IJ SOLN
4.0000 mg | Freq: Once | INTRAMUSCULAR | Status: AC
  Administered 2022-10-12: 4 mg via INTRAVENOUS
  Filled 2022-10-12: qty 2

## 2022-10-12 MED ORDER — ONDANSETRON 4 MG PO TBDP: ORAL_TABLET | ORAL | 0 refills | Status: DC

## 2022-10-12 NOTE — ED Triage Notes (Signed)
Patient w/ nausea and vomiting last night. Endorses 2-3 episodes of emesis. Denies any abdominal pain. Denies urinary symptoms.

## 2022-10-12 NOTE — ED Provider Notes (Signed)
[facility] Provider Note   CSN: [id number] Arrival date & time: 10/12/22  1251     History  Chief Complaint  Patient presents with   Nausea   Emesis    Rachel Kelly is a 77 y.o. female.  77 yo F with a chief complaints of nausea vomiting and diarrhea.  This been going on since last night.  No one else is sick with this that she knows of no abdominal pain but feels like it is gurgling.  She denies any fevers.  Denies suspicious food or water intake.  Denies recent travel.        Home Medications Prior to Admission medications   Medication Sig Start Date End Date Taking? Authorizing Provider  ondansetron (ZOFRAN-ODT) 4 MG disintegrating tablet 4mg  ODT q4 hours prn nausea/vomit 10/12/22  Yes Deno Etienne, DO  aspirin-sod bicarb-citric acid (ALKA-SELTZER) 325 MG TBEF tablet Take 325 mg by mouth every 6 (six) hours as needed (cold symptoms).     [provider]  Coenzyme Q10 200 MG TABS Take 200 mg by mouth daily.    [provider]  gabapentin (NEURONTIN) 100 MG capsule Take 1 capsule (100 mg total) by mouth at bedtime. 06/24/22   Benay Pike, MD  hydrocortisone 2.5 % cream Apply 1 application topically 2 (two) times daily as needed (dry skin).    [provider]  ketotifen (ALAWAY) 0.025 % ophthalmic solution Place 1 drop into both eyes 2 (two) times daily as needed (itchy, dry eyes).     [provider]  methocarbamol (ROBAXIN) 500 MG tablet Take 500 mg by mouth 3 (three) times daily as needed for pain. 12/07/19   [provider]  pantoprazole (PROTONIX) 40 MG tablet Take 1 tablet (40 mg total) by mouth 2 (two) times daily before a meal for 60 days, THEN 1 tablet (40 mg total) daily. 01/05/20 05/04/20  Pokhrel, Corrie Mckusick, MD  Polyvinyl Alcohol-Povidone PF (REFRESH) 1.4-0.6 % SOLN Place 1 drop into both eyes daily as needed (dry eyes).     [provider]  potassium gluconate 595 MG TABS  Take 595 mg by mouth daily.     [provider]  simvastatin (ZOCOR) 20 MG tablet Take 20 mg by mouth every morning.     [provider]  triamterene-hydrochlorothiazide (MAXZIDE-25) 37.5-25 MG per tablet Take 1 tablet by mouth every morning.     [provider]  prochlorperazine (COMPAZINE) 10 MG tablet Take 1 tablet (10 mg total) by mouth every 6 (six) hours as needed (Nausea or vomiting). 07/13/18 08/20/18  [name], [name], MD      Allergies    Shellfish allergy, Enbucrilate, Penicillins, and Skin adhesives [cyanoacrylate]    Review of Systems   Review of Systems  Physical Exam Updated Vital Signs BP (!) 150/74   Pulse 63   Temp 98.6 F (37 C) (Oral)   Resp 14   Ht 4\' 11"  (1.499 m)   Wt 69 kg   SpO2 97%   BMI 30.72 kg/m  Physical Exam Vitals and nursing note reviewed.  Constitutional:      General: She is not in acute distress.    Appearance: She is well-developed. She is not diaphoretic.  HENT:     Head: Normocephalic and atraumatic.  Eyes:     Pupils: Pupils are equal, round, and reactive to light.  Cardiovascular:     Rate and Rhythm: Normal rate and regular rhythm.  Heart sounds: No murmur heard.    No friction rub. No gallop.  Pulmonary:     Effort: Pulmonary effort is normal.     Breath sounds: No wheezing or rales.  Abdominal:     General: There is no distension.     Palpations: Abdomen is soft.     Tenderness: There is no abdominal tenderness.  Musculoskeletal:        General: No tenderness.     Cervical back: Normal range of motion and neck supple.  Skin:    General: Skin is warm and dry.  Neurological:     Mental Status: She is alert and oriented to person, place, and time.  Psychiatric:        Behavior: Behavior normal.     ED Results / Procedures / Treatments   Labs (all labs ordered are listed, but only abnormal results are displayed) Labs Reviewed  COMPREHENSIVE METABOLIC PANEL - Abnormal; Notable for the  following components:      Result Value   CO2 19 (*)    Calcium 8.7 (*)    Total Bilirubin 1.4 (*)    All other components within normal limits  CBC WITH DIFFERENTIAL/PLATELET  LIPASE, BLOOD  MAGNESIUM    EKG None  Radiology No results found.  Procedures Procedures    Medications Ordered in ED Medications  morphine (PF) 2 MG/ML injection 2 mg (0 mg Intravenous Hold 10/12/22 1342)  sodium chloride 0.9 % bolus 1,000 mL (1,000 mLs Intravenous New Bag/Given 10/12/22 1336)  ondansetron (ZOFRAN) injection 4 mg (4 mg Intravenous Given 10/12/22 1337)    ED Course/ Medical Decision Making/ A&P                             Medical Decision Making Amount and/or Complexity of Data Reviewed Labs: ordered.  Risk Prescription drug management.   77 yo F with a chief complaint of nausea vomiting and diarrhea.  Going on since last night.  She is well-appearing and nontoxic.  Will give bolus of IV fluids blood work pain and nausea medicine.  Reassess.  On record review the patient has a remote history of breast cancer, thought to be in remission.  Had seen GI for nondescript abdominal pain about 10 months ago.  CT imaging that was negative.  Patient has a very mild metabolic acidosis with a bicarb of 19.  I suspect this is a mild ketosis.  Lab work is otherwise unremarkable.  No electrolyte abnormality, no anemia.  Patient is feeling much better on repeat assessment.  Able to tolerate by mouth without issue.  Will discharge home.  PCP follow-up.  2:40 PM:  I have discussed the diagnosis/risks/treatment options with the patient and family.  Evaluation and diagnostic testing in the emergency department does not suggest an emergent condition requiring admission or immediate intervention beyond what has been performed at this time.  They will follow up with PCP. We also discussed returning to the ED immediately if new or worsening sx occur. We discussed the sx which are most concerning (e.g.,  sudden worsening pain, fever, inability to tolerate by mouth) that necessitate immediate return. Medications administered to the patient during their visit and any new prescriptions provided to the patient are listed below.  Medications given during this visit Medications  morphine (PF) 2 MG/ML injection 2 mg (0 mg Intravenous Hold 10/12/22 1342)  sodium chloride 0.9 % bolus 1,000 mL (1,000 mLs Intravenous New Bag/Given 10/12/22  1336)  ondansetron (ZOFRAN) injection 4 mg (4 mg Intravenous Given 10/12/22 1337)     The patient appears reasonably screen and/or stabilized for discharge and I doubt any other medical condition or other [facility] requiring further screening, evaluation, or treatment in the ED at this time prior to discharge.             Final Clinical Impression(s) / ED Diagnoses Final diagnoses:  Nausea vomiting and diarrhea    Rx / DC Orders ED Discharge Orders          Ordered    ondansetron (ZOFRAN-ODT) 4 MG disintegrating tablet        10/12/22 [place], Charell Faulk, DO 10/12/22 1440

## 2022-10-12 NOTE — Discharge Instructions (Signed)
I prescribed you medication for nausea.  You can take Imodium for diarrhea.  This is a medication that is over-the-counter.  Please return for worsening symptoms, if you are unable to eat and drink, if you develop abdominal pain.  Please follow-up with your family doctor in the office.

## 2022-11-03 DIAGNOSIS — R197 Diarrhea, unspecified: Secondary | ICD-10-CM | POA: Diagnosis not present

## 2022-11-03 DIAGNOSIS — N1831 Chronic kidney disease, stage 3a: Secondary | ICD-10-CM | POA: Diagnosis not present

## 2022-11-03 DIAGNOSIS — I10 Essential (primary) hypertension: Secondary | ICD-10-CM | POA: Diagnosis not present

## 2022-12-01 DIAGNOSIS — K582 Mixed irritable bowel syndrome: Secondary | ICD-10-CM | POA: Diagnosis not present

## 2022-12-11 DIAGNOSIS — N1831 Chronic kidney disease, stage 3a: Secondary | ICD-10-CM | POA: Diagnosis not present

## 2022-12-11 DIAGNOSIS — I10 Essential (primary) hypertension: Secondary | ICD-10-CM | POA: Diagnosis not present

## 2022-12-11 DIAGNOSIS — E785 Hyperlipidemia, unspecified: Secondary | ICD-10-CM | POA: Diagnosis not present

## 2022-12-11 DIAGNOSIS — R739 Hyperglycemia, unspecified: Secondary | ICD-10-CM | POA: Diagnosis not present

## 2022-12-11 DIAGNOSIS — E1169 Type 2 diabetes mellitus with other specified complication: Secondary | ICD-10-CM | POA: Diagnosis not present

## 2022-12-12 ENCOUNTER — Inpatient Hospital Stay: Payer: Medicare PPO | Attending: Hematology and Oncology | Admitting: Hematology and Oncology

## 2022-12-25 DIAGNOSIS — M4800 Spinal stenosis, site unspecified: Secondary | ICD-10-CM | POA: Diagnosis not present

## 2022-12-25 DIAGNOSIS — I129 Hypertensive chronic kidney disease with stage 1 through stage 4 chronic kidney disease, or unspecified chronic kidney disease: Secondary | ICD-10-CM | POA: Diagnosis not present

## 2022-12-25 DIAGNOSIS — R32 Unspecified urinary incontinence: Secondary | ICD-10-CM | POA: Diagnosis not present

## 2022-12-25 DIAGNOSIS — E785 Hyperlipidemia, unspecified: Secondary | ICD-10-CM | POA: Diagnosis not present

## 2022-12-25 DIAGNOSIS — N182 Chronic kidney disease, stage 2 (mild): Secondary | ICD-10-CM | POA: Diagnosis not present

## 2022-12-25 DIAGNOSIS — E669 Obesity, unspecified: Secondary | ICD-10-CM | POA: Diagnosis not present

## 2022-12-25 DIAGNOSIS — K589 Irritable bowel syndrome without diarrhea: Secondary | ICD-10-CM | POA: Diagnosis not present

## 2022-12-25 DIAGNOSIS — M1990 Unspecified osteoarthritis, unspecified site: Secondary | ICD-10-CM | POA: Diagnosis not present

## 2022-12-25 DIAGNOSIS — K219 Gastro-esophageal reflux disease without esophagitis: Secondary | ICD-10-CM | POA: Diagnosis not present

## 2023-01-05 DIAGNOSIS — Z0001 Encounter for general adult medical examination with abnormal findings: Secondary | ICD-10-CM | POA: Diagnosis not present

## 2023-01-05 DIAGNOSIS — K582 Mixed irritable bowel syndrome: Secondary | ICD-10-CM | POA: Diagnosis not present

## 2023-01-23 ENCOUNTER — Encounter (HOSPITAL_COMMUNITY): Payer: Self-pay | Admitting: *Deleted

## 2023-01-23 ENCOUNTER — Ambulatory Visit (INDEPENDENT_AMBULATORY_CARE_PROVIDER_SITE_OTHER): Payer: Medicare PPO

## 2023-01-23 ENCOUNTER — Ambulatory Visit (HOSPITAL_COMMUNITY)
Admission: EM | Admit: 2023-01-23 | Discharge: 2023-01-23 | Disposition: A | Discharge status: Home / Self Care | Payer: Medicare PPO

## 2023-01-23 DIAGNOSIS — M25552 Pain in left hip: Secondary | ICD-10-CM

## 2023-01-23 DIAGNOSIS — M160 Bilateral primary osteoarthritis of hip: Secondary | ICD-10-CM | POA: Diagnosis not present

## 2023-01-23 DIAGNOSIS — M1612 Unilateral primary osteoarthritis, left hip: Secondary | ICD-10-CM

## 2023-01-23 MED ORDER — TRIAMCINOLONE ACETONIDE 40 MG/ML IJ SUSP
40.0000 mg | Freq: Once | INTRAMUSCULAR | Status: AC
Start: 2023-01-23 — End: 2023-01-23
  Administered 2023-01-23: 40 mg via INTRAMUSCULAR

## 2023-01-23 MED ORDER — ACETAMINOPHEN 500 MG PO TABS: 500.0000 mg | ORAL_TABLET | Freq: Four times a day (QID) | ORAL | 0 refills | Status: AC | PRN

## 2023-01-23 MED ORDER — TRIAMCINOLONE ACETONIDE 40 MG/ML IJ SUSP
INTRAMUSCULAR | Status: AC
  Filled 2023-01-23: qty 1

## 2023-01-23 NOTE — ED Provider Notes (Addendum)
[facility]    CSN: [id number] Arrival date & time: 01/23/23  0913      History   Chief Complaint Chief Complaint  Patient presents with   Hip Pain    [name] is a 77 y.o. female.   Patient presents to clinic for left hip pain that radiates into her left knee that has been persistent but worsening since prior to March of this year.  The pain is the worst when she goes to get up out of bed in the mornings, she has to brace her hand on her hip to help with the pain.  Reports her gait has changed to compensate for the pain.  Her pain does get better throughout the day.  She has not tried anything for the pain.  She is ambulatory.  She denies any trauma, falls or back pain.  She was diagnosed with trochanteric bursitis of her right hip in 2023 by orthopedics.    The history is provided by the patient and medical records.  Hip Pain    Past Medical History:  Diagnosis Date   Anemia    Blood transfusion 2005   Family history of breast cancer    Family history of prostate cancer    Hernia    HH (hiatus hernia)    Hyperlipidemia    Hypertension    PAH (pulmonary artery hypertension) (HCC)    Personal history of chemotherapy    Personal history of radiation therapy    Sarcoidosis    Shortness of breath    on exertion   Sleep apnea    does not use cpap   Wheezing     Patient Active Problem List   Diagnosis Date Noted   Vomiting 01/04/2020   Enteritis 01/01/2020   Chest pain 01/01/2020   CKD (chronic kidney disease), stage III (HCC) 01/01/2020   Genetic testing 06/14/2019   Family history of prostate cancer    Family history of breast cancer    Port-A-Cath in place 08/02/2018   Pre-op evaluation 06/24/2018   Malignant neoplasm of upper-outer quadrant of right breast in female, estrogen receptor negative (HCC) 06/07/2018   Nausea & vomiting 11/21/2012   Bilateral renal masses 11/21/2012   Back pain, thoracic 08/14/2012   Obstructive sleep apnea  05/09/2012   GERD (gastroesophageal reflux disease) 06/28/2010   Hyperlipidemia 12/09/2007   Essential hypertension 12/09/2007   Sarcoidosis 11/26/2007   DYSPNEA 11/26/2007    Past Surgical History:  Procedure Laterality Date   ABDOMINAL HYSTERECTOMY  1988   BACK SURGERY  2005   lower   BIOPSY  01/03/2020   Procedure: BIOPSY;  Surgeon: Kerin Salen, MD;  Location: [facility] END[facility]COPY;  Service: Gastroenterology;;   BREAST BIOPSY Right 06/02/2018   BREAST LUMPECTOMY Right 07/02/2018   BREAST LUMPECTOMY WITH RADIOACTIVE SEED AND SENTINEL LYMPH NODE BIOPSY Right 07/02/2018   Procedure: RIGHT BREAST RADIOACTIVE SEED LUMPECTOMY X2 AND RIGHT AXILLARY  SENTINEL LYMPH NODE BIOPSY;  Surgeon: Glenna Fellows, MD;  Location: [facility] OR;  Service: General;  Laterality: Right;   ESOPHAGOGASTRODUODENOSCOPY  06/24/2012   Procedure: ESOPHAGOGASTRODUODENOSCOPY (EGD);  Surgeon: Kandis Cocking, MD;  Location: [facility] END[facility]COPY;  Service: General;  Laterality: N/A;   ESOPHAGOGASTRODUODENOSCOPY N/A 01/03/2020   Procedure: ESOPHAGOGASTRODUODENOSCOPY (EGD);  Surgeon: Kerin Salen, MD;  Location: [facility] ENDOSCOPY;  Service: Gastroenterology;  Laterality: N/A;   HIATAL HERNIA REPAIR  08/18/2012   Procedure: LAPAROSCOPIC REPAIR OF HIATAL HERNIA;  Surgeon: Mariella Saa, MD;  Location: [facility] ORS;  Service:  General;  Laterality: N/A;  Laparoscopic Hiatal Hernia Repair    INSERTION OF MESH  08/18/2012   Procedure: INSERTION OF MESH;  Surgeon: Mariella Saa, MD;  Location: [facility]S;  Service: General;;   IR IMAGING GUIDED PORT INSERTION  07/19/2018   POLYPECTOMY  01/03/2020   Procedure: POLYPECTOMY;  Surgeon: Kerin Salen, MD;  Location: [facility] ENDOSCOPY;  Service: Gastroenterology;;   [facility] REMOVAL Right 08/17/2019   Procedure: REMOVAL PORT-A-CATH;  Surgeon: Griselda Miner, MD;  Location: [facility];  Service: General;  Laterality: Right;   PORTACATH PLACEMENT N/A 07/02/2018   Procedure: INSERTION PORT-A-CATH TRIED  NO PORT IN PLACE;  Surgeon: Glenna Fellows, MD;  Location: [facility];  Service: General;  Laterality: N/A;   VESICOVAGINAL FISTULA CLOSURE W/ TAH      OB History   No obstetric history on file.      Home Medications    Prior to Admission medications   Medication Sig Start Date End Date Taking? Authorizing Provider  acetaminophen (TYLENOL) 500 MG tablet Take 1 tablet (500 mg total) by mouth every 6 (six) hours as needed. 01/23/23  Yes Rinaldo Ratel, [place], FNP  Coenzyme Q10 200 MG TABS Take 200 mg by mouth daily.   Yes [provider]  gabapentin (NEURONTIN) 100 MG capsule Take 1 capsule (100 mg total) by mouth at bedtime. 06/24/22  Yes Rachel Moulds, MD  hydrocortisone 2.5 % cream Apply 1 application topically 2 (two) times daily as needed (dry skin).   Yes [provider]  omeprazole (PRILOSEC) 20 MG capsule Take 40 mg by mouth 2 (two) times daily.   Yes [provider]  simvastatin (ZOCOR) 20 MG tablet Take 20 mg by mouth every morning.    Yes [provider]  triamterene-hydrochlorothiazide (MAXZIDE-25) 37.5-25 MG per tablet Take 1 tablet by mouth every morning.    Yes [provider]  ketotifen (ALAWAY) 0.025 % ophthalmic solution Place 1 drop into both eyes 2 (two) times daily as needed (itchy, dry eyes).     [provider]  methocarbamol (ROBAXIN) 500 MG tablet Take 500 mg by mouth 3 (three) times daily as needed for pain. 12/07/19   [provider]  Polyvinyl Alcohol-Povidone PF (REFRESH) 1.4-0.6 % SOLN Place 1 drop into both eyes daily as needed (dry eyes).     [provider]  prochlorperazine (COMPAZINE) 10 MG tablet Take 1 tablet (10 mg total) by mouth every 6 (six) hours as needed (Nausea or vomiting). 07/13/18 08/20/18  [name], Valentino Hue, MD    Family History Family History  Problem Relation Age of Onset   Clotting disorder Mother        PE   Heart disease Father    Prostate cancer Brother 31    Prostate cancer Brother    Breast cancer Maternal Grandmother        spread to brain   Prostate cancer Paternal Grandfather    Breast cancer Maternal Aunt    Cancer Maternal Aunt        unk type   Coronary artery disease Other     Social History Social History   Tobacco Use   Smoking status: Never   Smokeless tobacco: Never  Vaping Use   Vaping Use: Never used  Substance Use Topics   Alcohol use: No   Drug use: No     Allergies   Shellfish allergy, Enbucrilate, Penicillins, and Skin adhesives [cyanoacrylate]   Review of Systems Review of Systems  Musculoskeletal:  Positive for gait problem.  Neurological:  Negative for weakness.     Physical Exam Triage Vital Signs ED Triage Vitals  Enc Vitals Group     BP 01/23/23 0943 123/79     Pulse Rate 01/23/23 0943 70     Resp 01/23/23 0943 18     Temp 01/23/23 0943 98.2 F (36.8 C)     Temp Source 01/23/23 0943 Oral     SpO2 01/23/23 0943 95 %     Weight --      Height --      Head Circumference --      Peak Flow --      Pain Score 01/23/23 0941 9     Pain Loc --      Pain Edu? --      Excl. in GC? --    No data found.  Updated Vital Signs BP 123/79 (BP Location: Left Arm)   Pulse 70   Temp 98.2 F (36.8 C) (Oral)   Resp 18   SpO2 95%   Visual Acuity Right Eye Distance:   Left Eye Distance:   Bilateral Distance:    Right Eye Near:   Left Eye Near:    Bilateral Near:     Physical Exam Vitals and nursing note reviewed.  Constitutional:      Appearance: Normal appearance.  HENT:     Head: Normocephalic and atraumatic.     Right Ear: External ear normal.     Left Ear: External ear normal.     Nose: Nose normal.     Mouth/Throat:     Mouth: Mucous membranes are moist.  Eyes:     Conjunctiva/sclera: Conjunctivae normal.  Cardiovascular:     Rate and Rhythm: Normal rate.     Pulses: Normal pulses.  Pulmonary:     Effort: Pulmonary effort is normal. No respiratory distress.  Musculoskeletal:         General: No swelling, tenderness, deformity or signs of injury. Normal range of motion.     Left hip: No tenderness or crepitus. Normal range of motion. Normal strength.     Right lower leg: No edema.     Left lower leg: No edema.       Legs:     Comments: Area of pain appears to be deep within the hip joint.  Without tenderness to palpation, decreased range of motion or obvious deformity.  Patient is ambulatory.  Pedal pulses are 2+.  Strength 5 out of 5 in lower extremities.  Range of motion intact.  Skin:    General: Skin is warm and dry.     Capillary Refill: Capillary refill takes less than 2 seconds.  Neurological:     General: No focal deficit present.     Mental Status: She is alert and oriented to person, place, and time.  Psychiatric:        Mood and Affect: Mood normal.        Behavior: Behavior is cooperative.      UC Treatments / Results  Labs (all labs ordered are listed, but only abnormal results are displayed) Labs Reviewed - No data to display  EKG   Radiology No results found.  Procedures Procedures (including critical care time)  Medications Ordered in UC Medications  triamcinolone acetonide (KENALOG-40) injection 40 mg (has no administration in time range)    Initial Impression / Assessment and Plan / UC Course  I have reviewed the triage vital signs and the nursing notes.  Pertinent labs & imaging results that were available during my care of the patient were reviewed by me and considered in my medical decision making (see chart for details).  Vitals and triage reviewed, patient is hemodynamically stable.  Left hip pain ongoing for months, atraumatic.  History of trochanteric bursitis on her right hip. Area is non-tender to palpation. Strength 5/5 in Le and ROM intact. Imaging in clinic without obvious fracture, dislocation or deformity.  Does appear to have some arthritic changes, official radiology interpretation is pending at the time of  discharge.  Given IM Kenalog in clinic for pain and inflammation, advised Tylenol arthritis and orthopedic follow-up for further evaluation.  Plan of care, follow-up care and return precautions given, no questions at this time.  Radiology interpretation confirms suspicions of OA with interpretation of moderate to severe pubic symphysis and mild-to-moderate bilateral femoroacetabular osteoarthritis.     Final Clinical Impressions(s) / UC Diagnoses   Final diagnoses:  Left hip pain     Discharge Instructions      We have given you a muscular steroid injection, please take the 500 mg of Tylenol every 6 hours as needed for pain.  I suggest following up with orthopedics for further evaluation, where they can potentially do joint injections that will give you longer lasting pain relief.  Please continue to stay active and keep moving.  Return to clinic for any new or urgent symptoms.     ED Prescriptions     Medication Sig Dispense Auth. Provider   acetaminophen (TYLENOL) 500 MG tablet Take 1 tablet (500 mg total) by mouth every 6 (six) hours as needed. 30 tablet Rebekka Lobello, [place], [place]      PDMP not reviewed this encounter.   Raye Wiens, [place], FNP 01/23/23 1043    Shin Lamour, [place] N, [place] 01/23/23 1105

## 2023-01-23 NOTE — Discharge Instructions (Signed)
We have given you a muscular steroid injection, please take the 500 mg of Tylenol every 6 hours as needed for pain.  I suggest following up with orthopedics for further evaluation, where they can potentially do joint injections that will give you longer lasting pain relief.  Please continue to stay active and keep moving.  Return to clinic for any new or urgent symptoms.

## 2023-01-23 NOTE — ED Triage Notes (Signed)
Pt states that she has had left hip pain since March which radiates down her leg. She states her pain is worse when she wakes up at night she isn't taking any meds for this pain.

## 2023-06-10 DIAGNOSIS — M7062 Trochanteric bursitis, left hip: Secondary | ICD-10-CM | POA: Diagnosis not present

## 2023-08-06 DIAGNOSIS — H04123 Dry eye syndrome of bilateral lacrimal glands: Secondary | ICD-10-CM | POA: Diagnosis not present

## 2023-08-06 DIAGNOSIS — H43813 Vitreous degeneration, bilateral: Secondary | ICD-10-CM | POA: Diagnosis not present

## 2023-08-06 DIAGNOSIS — H402231 Chronic angle-closure glaucoma, bilateral, mild stage: Secondary | ICD-10-CM | POA: Diagnosis not present

## 2023-08-06 DIAGNOSIS — H35361 Drusen (degenerative) of macula, right eye: Secondary | ICD-10-CM | POA: Diagnosis not present

## 2023-09-02 ENCOUNTER — Other Ambulatory Visit: Payer: Self-pay | Admitting: Family Medicine

## 2023-09-02 DIAGNOSIS — Z853 Personal history of malignant neoplasm of breast: Secondary | ICD-10-CM

## 2023-09-23 DIAGNOSIS — M7062 Trochanteric bursitis, left hip: Secondary | ICD-10-CM | POA: Diagnosis not present

## 2023-09-25 DIAGNOSIS — M7062 Trochanteric bursitis, left hip: Secondary | ICD-10-CM | POA: Diagnosis not present

## 2023-10-02 ENCOUNTER — Encounter

## 2023-11-02 ENCOUNTER — Encounter

## 2023-11-02 DIAGNOSIS — Q6100 Congenital renal cyst, unspecified: Secondary | ICD-10-CM | POA: Diagnosis not present

## 2023-11-02 DIAGNOSIS — E7800 Pure hypercholesterolemia, unspecified: Secondary | ICD-10-CM | POA: Diagnosis not present

## 2023-11-02 DIAGNOSIS — I69811 Memory deficit following other cerebrovascular disease: Secondary | ICD-10-CM | POA: Diagnosis not present

## 2023-11-02 DIAGNOSIS — N1831 Chronic kidney disease, stage 3a: Secondary | ICD-10-CM | POA: Diagnosis not present

## 2023-11-02 DIAGNOSIS — Q6102 Congenital multiple renal cysts: Secondary | ICD-10-CM | POA: Diagnosis not present

## 2023-11-02 DIAGNOSIS — G4733 Obstructive sleep apnea (adult) (pediatric): Secondary | ICD-10-CM | POA: Diagnosis not present

## 2023-11-02 DIAGNOSIS — M1831 Unilateral post-traumatic osteoarthritis of first carpometacarpal joint, right hand: Secondary | ICD-10-CM | POA: Diagnosis not present

## 2023-11-02 DIAGNOSIS — K582 Mixed irritable bowel syndrome: Secondary | ICD-10-CM | POA: Diagnosis not present

## 2023-11-02 DIAGNOSIS — K219 Gastro-esophageal reflux disease without esophagitis: Secondary | ICD-10-CM | POA: Diagnosis not present

## 2023-11-02 DIAGNOSIS — I129 Hypertensive chronic kidney disease with stage 1 through stage 4 chronic kidney disease, or unspecified chronic kidney disease: Secondary | ICD-10-CM | POA: Diagnosis not present

## 2023-11-02 DIAGNOSIS — I10 Essential (primary) hypertension: Secondary | ICD-10-CM | POA: Diagnosis not present

## 2023-11-11 ENCOUNTER — Encounter

## 2023-12-08 DIAGNOSIS — I10 Essential (primary) hypertension: Secondary | ICD-10-CM | POA: Diagnosis not present

## 2023-12-08 DIAGNOSIS — E7800 Pure hypercholesterolemia, unspecified: Secondary | ICD-10-CM | POA: Diagnosis not present

## 2023-12-08 DIAGNOSIS — Q6100 Congenital renal cyst, unspecified: Secondary | ICD-10-CM | POA: Diagnosis not present

## 2023-12-08 DIAGNOSIS — G4733 Obstructive sleep apnea (adult) (pediatric): Secondary | ICD-10-CM | POA: Diagnosis not present

## 2023-12-08 DIAGNOSIS — R634 Abnormal weight loss: Secondary | ICD-10-CM | POA: Diagnosis not present

## 2023-12-08 DIAGNOSIS — K582 Mixed irritable bowel syndrome: Secondary | ICD-10-CM | POA: Diagnosis not present

## 2023-12-08 DIAGNOSIS — I69811 Memory deficit following other cerebrovascular disease: Secondary | ICD-10-CM | POA: Diagnosis not present

## 2024-02-29 DIAGNOSIS — G4733 Obstructive sleep apnea (adult) (pediatric): Secondary | ICD-10-CM | POA: Diagnosis not present

## 2024-02-29 DIAGNOSIS — K2100 Gastro-esophageal reflux disease with esophagitis, without bleeding: Secondary | ICD-10-CM | POA: Diagnosis not present

## 2024-02-29 DIAGNOSIS — I69811 Memory deficit following other cerebrovascular disease: Secondary | ICD-10-CM | POA: Diagnosis not present

## 2024-02-29 DIAGNOSIS — K582 Mixed irritable bowel syndrome: Secondary | ICD-10-CM | POA: Diagnosis not present

## 2024-02-29 DIAGNOSIS — Q6100 Congenital renal cyst, unspecified: Secondary | ICD-10-CM | POA: Diagnosis not present

## 2024-02-29 DIAGNOSIS — E7800 Pure hypercholesterolemia, unspecified: Secondary | ICD-10-CM | POA: Diagnosis not present

## 2024-02-29 DIAGNOSIS — C50411 Malignant neoplasm of upper-outer quadrant of right female breast: Secondary | ICD-10-CM | POA: Diagnosis not present

## 2024-02-29 DIAGNOSIS — I10 Essential (primary) hypertension: Secondary | ICD-10-CM | POA: Diagnosis not present

## 2024-03-02 DIAGNOSIS — I10 Essential (primary) hypertension: Secondary | ICD-10-CM | POA: Diagnosis not present

## 2024-03-02 DIAGNOSIS — Z0000 Encounter for general adult medical examination without abnormal findings: Secondary | ICD-10-CM | POA: Diagnosis not present

## 2024-03-28 DIAGNOSIS — R112 Nausea with vomiting, unspecified: Secondary | ICD-10-CM | POA: Diagnosis not present

## 2024-03-28 DIAGNOSIS — I10 Essential (primary) hypertension: Secondary | ICD-10-CM | POA: Diagnosis not present

## 2024-03-28 DIAGNOSIS — T675XXA Heat exhaustion, unspecified, initial encounter: Secondary | ICD-10-CM | POA: Diagnosis not present

## 2024-03-28 DIAGNOSIS — I69811 Memory deficit following other cerebrovascular disease: Secondary | ICD-10-CM | POA: Diagnosis not present

## 2024-05-20 ENCOUNTER — Encounter: Payer: Self-pay | Admitting: *Deleted

## 2024-05-20 NOTE — Progress Notes (Signed)
 TAWN FITZNER                                          MRN: [id number]   05/20/2024   The [facility] Quality Team Specialist reviewed this patient medical record for the purposes of chart review for care gap closure. The following were reviewed: chart review for care gap closure-controlling blood pressure.    [facility] Quality Team

## 2024-08-24 NOTE — Progress Notes (Signed)
 Rachel Kelly                                          MRN: [id number]   08/24/2024   The [facility] Quality Team Specialist reviewed this patient medical record for the purposes of chart review for care gap closure. The following were reviewed: chart review for care gap closure-controlling blood pressure.    [facility] Quality Team
